# Patient Record
Sex: Female | Born: 1935 | Race: White | Hispanic: No | Marital: Married | Smoking: Never smoker
Health system: Southern US, Community
[De-identification: ages and names within clinical notes are randomized; demographics above are authoritative.]

## PROBLEM LIST (undated history)

## (undated) DIAGNOSIS — E222 Syndrome of inappropriate secretion of antidiuretic hormone: Secondary | ICD-10-CM

## (undated) DIAGNOSIS — I701 Atherosclerosis of renal artery: Secondary | ICD-10-CM

## (undated) DIAGNOSIS — E871 Hypo-osmolality and hyponatremia: Secondary | ICD-10-CM

## (undated) DIAGNOSIS — J189 Pneumonia, unspecified organism: Secondary | ICD-10-CM

## (undated) DIAGNOSIS — Z950 Presence of cardiac pacemaker: Secondary | ICD-10-CM

## (undated) DIAGNOSIS — I4891 Unspecified atrial fibrillation: Secondary | ICD-10-CM

## (undated) DIAGNOSIS — G40909 Epilepsy, unspecified, not intractable, without status epilepticus: Secondary | ICD-10-CM

## (undated) DIAGNOSIS — I10 Essential (primary) hypertension: Secondary | ICD-10-CM

## (undated) DIAGNOSIS — M8580 Other specified disorders of bone density and structure, unspecified site: Secondary | ICD-10-CM

## (undated) DIAGNOSIS — I495 Sick sinus syndrome: Secondary | ICD-10-CM

## (undated) DIAGNOSIS — K449 Diaphragmatic hernia without obstruction or gangrene: Secondary | ICD-10-CM

## (undated) DIAGNOSIS — M549 Dorsalgia, unspecified: Secondary | ICD-10-CM

## (undated) DIAGNOSIS — M1990 Unspecified osteoarthritis, unspecified site: Secondary | ICD-10-CM

## (undated) DIAGNOSIS — K219 Gastro-esophageal reflux disease without esophagitis: Secondary | ICD-10-CM

## (undated) DIAGNOSIS — I5030 Unspecified diastolic (congestive) heart failure: Secondary | ICD-10-CM

## (undated) HISTORY — DX: Gastro-esophageal reflux disease without esophagitis: K21.9

## (undated) HISTORY — DX: Sick sinus syndrome: I49.5

## (undated) HISTORY — DX: Unspecified osteoarthritis, unspecified site: M19.90

## (undated) HISTORY — PX: TONSILLECTOMY: SUR1361

## (undated) HISTORY — PX: CATARACT EXTRACTION: SUR2

## (undated) HISTORY — PX: CARDIOVERSION: SHX1299

## (undated) HISTORY — DX: Other specified disorders of bone density and structure, unspecified site: M85.80

## (undated) HISTORY — PX: BACK SURGERY: SHX140

## (undated) HISTORY — PX: APPENDECTOMY: SHX54

## (undated) HISTORY — DX: Diaphragmatic hernia without obstruction or gangrene: K44.9

## (undated) HISTORY — DX: Syndrome of inappropriate secretion of antidiuretic hormone: E22.2

## (undated) HISTORY — PX: KNEE ARTHROSCOPY: SUR90

## (undated) HISTORY — DX: Epilepsy, unspecified, not intractable, without status epilepticus: G40.909

## (undated) HISTORY — DX: Unspecified atrial fibrillation: I48.91

## (undated) HISTORY — DX: Dorsalgia, unspecified: M54.9

## (undated) HISTORY — DX: Atherosclerosis of renal artery: I70.1

## (undated) HISTORY — PX: CHOLECYSTECTOMY: SHX55

## (undated) HISTORY — DX: Pneumonia, unspecified organism: J18.9

## (undated) HISTORY — PX: PARTIAL HYSTERECTOMY: SHX80

## (undated) HISTORY — DX: Hypo-osmolality and hyponatremia: E87.1

## (undated) HISTORY — PX: TOTAL KNEE ARTHROPLASTY: SHX125

## (undated) HISTORY — PX: SHOULDER SURGERY: SHX246

## (undated) HISTORY — DX: Essential (primary) hypertension: I10

---

## 1998-04-15 ENCOUNTER — Other Ambulatory Visit: Admission: RE | Admit: 1998-04-15 | Discharge: 1998-04-15 | Payer: Self-pay | Admitting: *Deleted

## 1998-12-14 ENCOUNTER — Ambulatory Visit (HOSPITAL_COMMUNITY): Admission: RE | Admit: 1998-12-14 | Discharge: 1998-12-14 | Payer: Self-pay | Admitting: *Deleted

## 1998-12-14 ENCOUNTER — Encounter: Payer: Self-pay | Admitting: *Deleted

## 1999-06-22 ENCOUNTER — Encounter: Payer: Self-pay | Admitting: Surgery

## 1999-06-22 ENCOUNTER — Inpatient Hospital Stay (HOSPITAL_COMMUNITY): Admission: EM | Admit: 1999-06-22 | Discharge: 1999-06-23 | Payer: Self-pay | Admitting: Internal Medicine

## 2000-02-16 ENCOUNTER — Encounter: Admission: RE | Admit: 2000-02-16 | Discharge: 2000-02-16 | Payer: Self-pay | Admitting: *Deleted

## 2000-02-16 ENCOUNTER — Encounter: Payer: Self-pay | Admitting: *Deleted

## 2000-07-10 ENCOUNTER — Ambulatory Visit (HOSPITAL_COMMUNITY): Admission: RE | Admit: 2000-07-10 | Discharge: 2000-07-10 | Payer: Self-pay | Admitting: Cardiology

## 2001-03-27 ENCOUNTER — Encounter: Admission: RE | Admit: 2001-03-27 | Discharge: 2001-03-27 | Payer: Self-pay | Admitting: Internal Medicine

## 2001-03-27 ENCOUNTER — Encounter: Payer: Self-pay | Admitting: Internal Medicine

## 2001-05-09 ENCOUNTER — Encounter: Payer: Self-pay | Admitting: Specialist

## 2001-05-10 ENCOUNTER — Inpatient Hospital Stay (HOSPITAL_COMMUNITY): Admission: RE | Admit: 2001-05-10 | Discharge: 2001-05-11 | Payer: Self-pay | Admitting: Specialist

## 2002-04-08 ENCOUNTER — Encounter: Payer: Self-pay | Admitting: Internal Medicine

## 2002-04-08 ENCOUNTER — Encounter: Admission: RE | Admit: 2002-04-08 | Discharge: 2002-04-08 | Payer: Self-pay | Admitting: Internal Medicine

## 2002-04-22 ENCOUNTER — Encounter: Payer: Self-pay | Admitting: Specialist

## 2002-04-28 ENCOUNTER — Inpatient Hospital Stay (HOSPITAL_COMMUNITY): Admission: RE | Admit: 2002-04-28 | Discharge: 2002-05-03 | Payer: Self-pay | Admitting: Specialist

## 2002-06-12 ENCOUNTER — Inpatient Hospital Stay (HOSPITAL_COMMUNITY): Admission: AD | Admit: 2002-06-12 | Discharge: 2002-06-16 | Payer: Self-pay | Admitting: Specialist

## 2002-07-25 ENCOUNTER — Inpatient Hospital Stay (HOSPITAL_COMMUNITY): Admission: RE | Admit: 2002-07-25 | Discharge: 2002-07-26 | Payer: Self-pay | Admitting: Specialist

## 2003-03-12 ENCOUNTER — Encounter: Payer: Self-pay | Admitting: Internal Medicine

## 2003-03-12 ENCOUNTER — Encounter: Admission: RE | Admit: 2003-03-12 | Discharge: 2003-03-12 | Payer: Self-pay | Admitting: Internal Medicine

## 2003-03-13 ENCOUNTER — Encounter: Payer: Self-pay | Admitting: Internal Medicine

## 2003-03-13 ENCOUNTER — Encounter: Admission: RE | Admit: 2003-03-13 | Discharge: 2003-03-13 | Payer: Self-pay | Admitting: Internal Medicine

## 2003-08-18 ENCOUNTER — Inpatient Hospital Stay (HOSPITAL_COMMUNITY): Admission: RE | Admit: 2003-08-18 | Discharge: 2003-08-21 | Payer: Self-pay | Admitting: Orthopedic Surgery

## 2003-08-31 ENCOUNTER — Inpatient Hospital Stay (HOSPITAL_COMMUNITY): Admission: EM | Admit: 2003-08-31 | Discharge: 2003-09-01 | Payer: Self-pay | Admitting: Emergency Medicine

## 2003-10-16 ENCOUNTER — Inpatient Hospital Stay (HOSPITAL_COMMUNITY): Admission: RE | Admit: 2003-10-16 | Discharge: 2003-10-19 | Payer: Self-pay | Admitting: Orthopedic Surgery

## 2004-03-29 ENCOUNTER — Encounter: Admission: RE | Admit: 2004-03-29 | Discharge: 2004-03-29 | Payer: Self-pay | Admitting: Internal Medicine

## 2005-05-08 ENCOUNTER — Encounter: Admission: RE | Admit: 2005-05-08 | Discharge: 2005-05-08 | Payer: Self-pay | Admitting: Internal Medicine

## 2005-09-18 HISTORY — PX: CARDIAC CATHETERIZATION: SHX172

## 2005-11-18 ENCOUNTER — Inpatient Hospital Stay (HOSPITAL_COMMUNITY): Admission: EM | Admit: 2005-11-18 | Discharge: 2005-11-22 | Payer: Self-pay | Admitting: Emergency Medicine

## 2005-11-20 ENCOUNTER — Encounter (INDEPENDENT_AMBULATORY_CARE_PROVIDER_SITE_OTHER): Payer: Self-pay | Admitting: *Deleted

## 2006-05-09 ENCOUNTER — Encounter: Admission: RE | Admit: 2006-05-09 | Discharge: 2006-05-09 | Payer: Self-pay | Admitting: Internal Medicine

## 2006-08-06 ENCOUNTER — Inpatient Hospital Stay (HOSPITAL_COMMUNITY): Admission: EM | Admit: 2006-08-06 | Discharge: 2006-08-07 | Payer: Self-pay | Admitting: Emergency Medicine

## 2006-09-06 ENCOUNTER — Encounter: Admission: RE | Admit: 2006-09-06 | Discharge: 2006-09-06 | Payer: Self-pay | Admitting: Internal Medicine

## 2006-09-18 DIAGNOSIS — I701 Atherosclerosis of renal artery: Secondary | ICD-10-CM

## 2006-09-18 HISTORY — DX: Atherosclerosis of renal artery: I70.1

## 2006-10-25 ENCOUNTER — Encounter: Admission: RE | Admit: 2006-10-25 | Discharge: 2006-10-25 | Payer: Self-pay | Admitting: Internal Medicine

## 2006-12-11 ENCOUNTER — Ambulatory Visit: Payer: Self-pay | Admitting: *Deleted

## 2007-01-17 HISTORY — PX: RENAL ANGIOPLASTY: SHX2316

## 2007-01-21 ENCOUNTER — Ambulatory Visit: Payer: Self-pay | Admitting: Vascular Surgery

## 2007-02-15 ENCOUNTER — Ambulatory Visit (HOSPITAL_COMMUNITY): Admission: RE | Admit: 2007-02-15 | Discharge: 2007-02-15 | Payer: Self-pay | Admitting: Vascular Surgery

## 2007-02-15 ENCOUNTER — Ambulatory Visit: Payer: Self-pay | Admitting: Vascular Surgery

## 2007-02-25 ENCOUNTER — Ambulatory Visit: Payer: Self-pay | Admitting: Vascular Surgery

## 2007-03-18 ENCOUNTER — Ambulatory Visit: Payer: Self-pay | Admitting: Vascular Surgery

## 2007-05-14 ENCOUNTER — Encounter: Admission: RE | Admit: 2007-05-14 | Discharge: 2007-05-14 | Payer: Self-pay | Admitting: Internal Medicine

## 2007-06-14 ENCOUNTER — Ambulatory Visit: Payer: Self-pay | Admitting: Vascular Surgery

## 2007-10-10 ENCOUNTER — Encounter: Admission: RE | Admit: 2007-10-10 | Discharge: 2007-10-10 | Payer: Self-pay | Admitting: Cardiology

## 2007-12-06 ENCOUNTER — Ambulatory Visit: Payer: Self-pay | Admitting: Vascular Surgery

## 2008-05-14 ENCOUNTER — Encounter: Admission: RE | Admit: 2008-05-14 | Discharge: 2008-05-14 | Payer: Self-pay | Admitting: Internal Medicine

## 2008-06-05 ENCOUNTER — Ambulatory Visit: Payer: Self-pay | Admitting: Vascular Surgery

## 2008-12-04 ENCOUNTER — Ambulatory Visit: Payer: Self-pay | Admitting: Vascular Surgery

## 2009-05-20 ENCOUNTER — Encounter: Admission: RE | Admit: 2009-05-20 | Discharge: 2009-05-20 | Payer: Self-pay | Admitting: Internal Medicine

## 2009-07-16 ENCOUNTER — Ambulatory Visit: Payer: Self-pay | Admitting: Vascular Surgery

## 2009-12-31 ENCOUNTER — Ambulatory Visit: Payer: Self-pay | Admitting: Vascular Surgery

## 2010-03-23 ENCOUNTER — Inpatient Hospital Stay (HOSPITAL_COMMUNITY): Admission: AD | Admit: 2010-03-23 | Discharge: 2010-03-28 | Payer: Self-pay | Admitting: Internal Medicine

## 2010-04-18 DIAGNOSIS — J189 Pneumonia, unspecified organism: Secondary | ICD-10-CM

## 2010-04-18 HISTORY — DX: Pneumonia, unspecified organism: J18.9

## 2010-05-25 ENCOUNTER — Encounter: Admission: RE | Admit: 2010-05-25 | Discharge: 2010-05-25 | Payer: Self-pay | Admitting: Internal Medicine

## 2010-07-18 ENCOUNTER — Ambulatory Visit: Payer: Self-pay | Admitting: Vascular Surgery

## 2010-08-08 ENCOUNTER — Ambulatory Visit: Payer: Self-pay | Admitting: Cardiology

## 2010-10-09 ENCOUNTER — Encounter: Payer: Self-pay | Admitting: Internal Medicine

## 2010-10-10 ENCOUNTER — Encounter: Payer: Self-pay | Admitting: Internal Medicine

## 2010-12-04 LAB — BASIC METABOLIC PANEL
BUN: 2 mg/dL — ABNORMAL LOW (ref 6–23)
BUN: 4 mg/dL — ABNORMAL LOW (ref 6–23)
BUN: 4 mg/dL — ABNORMAL LOW (ref 6–23)
BUN: 5 mg/dL — ABNORMAL LOW (ref 6–23)
CO2: 31 mEq/L (ref 19–32)
Calcium: 8.2 mg/dL — ABNORMAL LOW (ref 8.4–10.5)
Calcium: 8.3 mg/dL — ABNORMAL LOW (ref 8.4–10.5)
Calcium: 8.6 mg/dL (ref 8.4–10.5)
Calcium: 8.9 mg/dL (ref 8.4–10.5)
Calcium: 9.2 mg/dL (ref 8.4–10.5)
Creatinine, Ser: 0.56 mg/dL (ref 0.4–1.2)
Creatinine, Ser: 0.6 mg/dL (ref 0.4–1.2)
Creatinine, Ser: 0.62 mg/dL (ref 0.4–1.2)
GFR calc Af Amer: >60 mL/min (ref >60)
GFR calc Af Amer: >60 mL/min (ref >60)
GFR calc Af Amer: >60 mL/min (ref >60)
GFR calc Af Amer: >60 mL/min (ref >60)
GFR calc non Af Amer: >60 mL/min (ref >60)
GFR calc non Af Amer: >60 mL/min (ref >60)
GFR calc non Af Amer: >60 mL/min (ref >60)
GFR calc non Af Amer: >60 mL/min (ref >60)
GFR calc non Af Amer: >60 mL/min (ref >60)
Glucose, Bld: 83 mg/dL (ref 70–99)
Glucose, Bld: 90 mg/dL (ref 70–99)
Glucose, Bld: 92 mg/dL (ref 70–99)
Glucose, Bld: 97 mg/dL (ref 70–99)
Potassium: 4 mEq/L (ref 3.5–5.1)
Sodium: 119 mEq/L — CL (ref 135–145)
Sodium: 131 mEq/L — ABNORMAL LOW (ref 135–145)
Sodium: 134 mEq/L — ABNORMAL LOW (ref 135–145)

## 2010-12-04 LAB — LEGIONELLA ANTIGEN, URINE

## 2010-12-04 LAB — COMPREHENSIVE METABOLIC PANEL
AST: 21 U/L (ref 0–37)
CO2: 27 mEq/L (ref 19–32)
Chloride: 82 mEq/L — ABNORMAL LOW (ref 96–112)
Creatinine, Ser: 0.69 mg/dL (ref 0.4–1.2)
GFR calc Af Amer: >60 mL/min (ref >60)
GFR calc non Af Amer: >60 mL/min (ref >60)
Glucose, Bld: 105 mg/dL — ABNORMAL HIGH (ref 70–99)
Total Bilirubin: 0.6 mg/dL (ref 0.3–1.2)

## 2010-12-04 LAB — DIFFERENTIAL
Basophils Absolute: 0 10*3/uL (ref 0.0–0.1)
Basophils Relative: 0 % (ref 0–1)
Eosinophils Absolute: 0.6 10*3/uL (ref 0.0–0.7)
Eosinophils Relative: 7 % — ABNORMAL HIGH (ref 0–5)
Neutrophils Relative %: 66 % (ref 43–77)

## 2010-12-04 LAB — CBC
HCT: 35.5 % — ABNORMAL LOW (ref 36.0–46.0)
Hemoglobin: 11.6 g/dL — ABNORMAL LOW (ref 12.0–15.0)
Hemoglobin: 12.1 g/dL (ref 12.0–15.0)
Hemoglobin: 12.9 g/dL (ref 12.0–15.0)
MCH: 31.2 pg (ref 26.0–34.0)
MCH: 31.8 pg (ref 26.0–34.0)
MCHC: 33.3 g/dL (ref 30.0–36.0)
MCHC: 33.4 g/dL (ref 30.0–36.0)
MCHC: 33.6 g/dL (ref 30.0–36.0)
MCHC: 34 g/dL (ref 30.0–36.0)
MCV: 93.3 fL (ref 78.0–100.0)
Platelets: 207 10*3/uL (ref 150–400)
Platelets: 211 10*3/uL (ref 150–400)
Platelets: 246 10*3/uL (ref 150–400)
Platelets: 280 10*3/uL (ref 150–400)
RBC: 3.81 MIL/uL — ABNORMAL LOW (ref 3.87–5.11)
RBC: 3.89 MIL/uL (ref 3.87–5.11)
RBC: 4.13 MIL/uL (ref 3.87–5.11)
RDW: 13.9 % (ref 11.5–15.5)
RDW: 14.1 % (ref 11.5–15.5)
RDW: 14.5 % (ref 11.5–15.5)

## 2010-12-04 LAB — SODIUM, URINE, RANDOM: Sodium, Ur: 58 mEq/L

## 2010-12-04 LAB — OSMOLALITY, URINE: Osmolality, Ur: 263 mOsm/kg — ABNORMAL LOW (ref 390–1090)

## 2011-01-30 ENCOUNTER — Encounter: Payer: Self-pay | Admitting: Nurse Practitioner

## 2011-01-30 ENCOUNTER — Encounter: Payer: Self-pay | Admitting: Internal Medicine

## 2011-01-30 ENCOUNTER — Telehealth: Payer: Self-pay | Admitting: Cardiology

## 2011-01-30 DIAGNOSIS — I701 Atherosclerosis of renal artery: Secondary | ICD-10-CM | POA: Insufficient documentation

## 2011-01-30 DIAGNOSIS — R0600 Dyspnea, unspecified: Secondary | ICD-10-CM | POA: Insufficient documentation

## 2011-01-30 DIAGNOSIS — I10 Essential (primary) hypertension: Secondary | ICD-10-CM | POA: Insufficient documentation

## 2011-01-30 DIAGNOSIS — K219 Gastro-esophageal reflux disease without esophagitis: Secondary | ICD-10-CM | POA: Insufficient documentation

## 2011-01-30 DIAGNOSIS — M549 Dorsalgia, unspecified: Secondary | ICD-10-CM | POA: Insufficient documentation

## 2011-01-30 DIAGNOSIS — J189 Pneumonia, unspecified organism: Secondary | ICD-10-CM | POA: Insufficient documentation

## 2011-01-30 NOTE — Telephone Encounter (Signed)
HAS NEW ONSET A-FIB, STARTING COUMADIN 5MG . TODAY. SAW SUE DRINKARD NP WITH DIZZINESS, SOB.  SUE WANTS HER SEEN THIS WEEK.

## 2011-01-31 NOTE — Procedures (Signed)
RENAL ARTERY DUPLEX EVALUATION   INDICATION:  Followup of right renal artery.   HISTORY:  Diabetes:  No  Cardiac:  No  Hypertension:  Yes  Smoking:  No   RENAL ARTERY DUPLEX FINDINGS:  Aorta-Proximal:  36 cm/s  Aorta-Mid:  78 cm/s  Aorta-Distal:  125 cm/s  Celiac Artery Origin:  108 cm/s  SMA Origin:  112 cm/s                                    RIGHT               LEFT  Renal Artery Origin:             135 cm/s            82 cm/s  Renal Artery Proximal:           95 cm/s             67 cm/s  Renal Artery Mid:                166 cm/s            51 cm/s  Renal Artery Distal:             128 cm/s            48 cm/s  Hilar Acceleration Time (AT):    0.69 m/s            0.68 m/s  Renal-Aortic Ratio (RAR):        2.1                 1.1  Kidney Size:                     9.7 cm              9.98  End Diastolic Ratio (EDR):  Resistive Index (RI):            0.75                0.74   IMPRESSION:  1. Patent right renal artery with less than 60% stenosis based on RAR      and velocity.  2. Left renal artery appears within normal limits.  3. Kidneys appear normal with respect to shape and size.        ___________________________________________  [name], M.D.   CB/MEDQ  D:  07/19/2009  T:  07/20/2009  Job:  [id number]

## 2011-01-31 NOTE — Procedures (Signed)
RENAL ARTERY DUPLEX EVALUATION   INDICATION:  Follow up renal artery disease.   HISTORY:  Diabetes:  No.  Cardiac:  No.  Hypertension:  Yes.  Smoking:  No.   RENAL ARTERY DUPLEX FINDINGS:  Aorta-Proximal:  51 cm/s  Aorta-Mid:  92 cm/s  Aorta-Distal:  95 cm/s  Celiac Artery Origin:  139 cm/s  SMA Origin:  224 cm/s                                    RIGHT               LEFT  Renal Artery Origin:             117 cm/s            100 cm/s  Renal Artery Proximal:           142 cm/s            63 cm/s  Renal Artery Mid:                214 cm/s            69 cm/s  Renal Artery Distal:             118 cm/s            64 cm/s  Hilar Acceleration Time (AT):  Renal-Aortic Ratio (RAR):        4.1                 1.4  Kidney Size:                     9.8 cm              11.2 cm  End Diastolic Ratio (EDR):  Resistive Index (RI):            0.75                0.81   IMPRESSION:  1. There appears to be >60% stenosis based on renal-aortic ratio and      velocities of the right renal artery.  2. Left renal artery appears within normal limits.  3. Kidneys appear normal with respect to size and shape.        ___________________________________________  [name], M.D.   CB/MEDQ  D:  12/31/2009  T:  12/31/2009  Job:  [id number]

## 2011-01-31 NOTE — Assessment & Plan Note (Signed)
OFFICE VISIT   BRYER, GOTTSCH  DOB:  1925-03-24                                       12/06/2007  WNUUV#:[id number]   The patient presents today for continued duplex followup of her right  renal artery angioplasty.  She underwent aortic and renal arteriogram on  02/15/2007 and underwent right renal artery angioplasty.  She presents  today for followup duplex evaluation.  She reports that she continues to  have treatment for hypertension and that her diastolic pressure runs in  the 70s and 80s.  She does not have any new major medical difficulties.   Today's blood pressure is 152/83, pulse 59, respirations 15.  She is in  no acute distress.  She underwent renal duplex evaluation today and we  have reviewed the results of this.  This shows no evidence of  significant recurrence with no significant stenosis in the bilateral  renal arteries.  I have reassured the patient with this . She will  continue her usual followup with Drs. Clelia Croft and [place].  We will see her  on a yearly basis with yearly duplex renal arteries to rule out  progression.   [name], M.D.  Electronically Signed   TFE/MEDQ  D:  12/06/2007  T:  12/09/2007  Job:  [id number]   cc:   Peter M. [place], M.D.  [name], M.D.

## 2011-01-31 NOTE — Procedures (Signed)
RENAL ARTERY DUPLEX EVALUATION   INDICATION:  Follow-up evaluation of right renal artery PTA performed on  02/15/07 by Dr. Arbie Cookey.  Patient has fibromuscular dysplasia.   HISTORY:  Diabetes:  No.  Cardiac:  No.  Hypertension:  Yes.  Smoking:  No.   RENAL ARTERY DUPLEX FINDINGS:  Aorta-Proximal:  43 cm/s  Aorta-Mid:  55 cm/s  Aorta-Distal:  50 cm/s  Celiac Artery Origin:  117 cm/s  SMA Origin:  95 cm/s                                    RIGHT               LEFT  Renal Artery Origin:             83/16 cm/s          65/18 cm/s  Renal Artery Proximal:           102/23 cm/s         57/14 cm/s  Renal Artery Mid:                127/23 cm/s         39/10 cm/s  Renal Artery Distal:             51/11 cm/s          53/14 cm/s  Hilar Acceleration Time (AT):  Renal-Aortic Ratio (RAR):        2.3                 1.5  Kidney Size:                     10.3 cm             10.1 cm  End Diastolic Ratio (EDR):       0.23 - 0.22         0.24 - 0.24  Resistive Index (RI):            0.78                0.76   IMPRESSION:  1. Renal-to-aortic ratio suggests <60% renal artery stenosis, status      post stent.  2. No left renal artery stenosis.  3. Kidneys are normal with respect to size and shape bilaterally.  4. Resistive indices and end-diastolic ratio suggest no parenchymal      disease bilaterally.   ___________________________________________  [name], M.D.   MC/MEDQ  D:  06/05/2008  T:  06/06/2008  Job:  [id number]

## 2011-01-31 NOTE — Op Note (Signed)
[name], [name]                 ACCOUNT NO.:  [id number]   MEDICAL RECORD NO.:  [id number]          PATIENT TYPE:  AMB   LOCATION:  SDS                          FACILITY:  [facility]   PHYSICIAN:  [name], M.D.    DATE OF BIRTH:  November 02, 1924   DATE OF PROCEDURE:  02/15/2007  DATE OF DISCHARGE:                               OPERATIVE REPORT   PREOPERATIVE DIAGNOSIS:  This right renal artery stenosis.   POSTOPERATIVE DIAGNOSIS:  This right renal artery stenosis.   PROCEDURES:  1. Aortogram with renal arteriogram.  2. Right renal artery angioplasty.   SURGEON:  [name], M.D.   ASSISTANT:  Nurse.   ANESTHESIA:  Lidocaine 1% local.   COMPLICATIONS:  None.   DISPOSITION:  To holding area in stable condition.   INDICATIONS:  The patient is an elderly white female with increasingly  severe difficult to control hypertension.  She underwent outpatient  Duplex renal arteries which showed probable stenosis in her mid right  renal artery.  She is taken to the angio suite at this time for  arteriography and possible intervention.   DESCRIPTION OF PROCEDURE:  The patient was taken to the peripheral  vascular cath lab and placed in the supine position where the area of  both groins were prepped and draped in the sterile fashion.  The right  common femoral artery area was sized local anesthesia and using a single-  wall puncture, a right common femoral artery was entered and a guidewire  passed up to the suprarenal aorta.  A 6-French sheath was passed over  the guidewire.  The pigtail catheter positioned to the level of the  suprarenal aorta.  AP projections was undertaken and this revealed  patent renal arteries bilaterally.  There was no evidence of stenosis in  the left renal artery.  There was evidence of fibromuscular dysplasia in  the right renal artery.  For this reason, angled right catheter was used  to selectively catheterize the right renal artery orifice and  selective  injections were undertaken again revealing fibromuscular dysplasia.  An  area at the initial branching of the renal arteries showed a web that  appeared to be flow-limiting.  With this and the known Duplex showing  elevated velocities in this area, it was recommend that she undergo  angioplasty.  The renal guide catheter was exchanged for the angled  right catheter.  The patient was given 3000 intravenous units of  heparin.  A 0.018 wire was chosen to selectively catheterize again the  renal artery and a 5 mm x 15 mm balloon was chosen.  This was an Programmer, applications  balloon.  It was positioned to the level of the web and was dilated.  This was dilated on two separate occasions at this level.  Repeat  arteriogram showed improvement and there was no  wasting of the 5 mm balloon.  The patient tolerated the procedure  without immediate complication.  Catheter and sheath were removed and  pressure was held for hemostasis once the ACT was normalized.  The  patient tolerated the procedure  without immediate complications and was  transferred to the holding area in stable condition.      [name], M.D.  Electronically Signed     TFE/MEDQ  D:  02/15/2007  T:  02/15/2007  Job:  [id number]   cc:   Peter M. [place], M.D.  [name], M.D.

## 2011-01-31 NOTE — Assessment & Plan Note (Signed)
OFFICE VISIT   [name], [name]  DOB:  06/06/1925                                       03/18/2007  IONGE#:[id number]   The patient presented today, actually had been misbooked by our office,  had just been seen on February 25, 2007.  She continues to do well, she  reports that she has been having better blood pressure as she checks it  at home.  Blood pressure today is 168/87.  She apologized for the  misbooking, she is to see me in 3 months.  She was told that there would  be no charge for today's visit and that she will see me in 3 months.   [name], M.D.  Electronically Signed   TFE/MEDQ  D:  03/18/2007  T:  03/19/2007  Job:  [id number]

## 2011-01-31 NOTE — Procedures (Signed)
RENAL ARTERY DUPLEX EVALUATION   INDICATION:  Followup right renal artery angioplasty on 02/15/2007.   HISTORY:  Diabetes:  No.  Cardiac:  No.  Hypertension:  Yes.  Smoking:  No.   RENAL ARTERY DUPLEX FINDINGS:  Aorta-Proximal:  58 cm/s  Aorta-Mid:  60 cm/s  Aorta-Distal:  66 cm/s  Celiac Artery Origin:  107 cm/s  SMA Origin:  135 cm/s                                    RIGHT               LEFT  Renal Artery Origin:             96 cm/s             65 cm/s  Renal Artery Proximal:           154 cm/s            74 cm/s  Renal Artery Mid:                162 cm/s            97 cm/s  Renal Artery Distal:             59 cm/s             51 cm/s  Hilar Acceleration Time (AT):  Renal-Aortic Ratio (RAR):        2.7                 1.6  Kidney Size:                     10.4 cm             10.8 cm  End Diastolic Ratio (EDR):  Resistive Index (RI):            0.75                0.75   IMPRESSION:  1. Patent bilateral renal arteries with no hemodynamically significant      stenoses noted based on Doppler velocities and renal aortic ratios.  2. The bilateral kidney length measurements and intrarenal resistive      indices are within normal limits.  3. Stable Doppler velocities noted in the bilateral renal arteries      when compared to the previous studies.   ___________________________________________  [name], M.D.   CH/MEDQ  D:  07/18/2010  T:  07/18/2010  Job:  [id number]

## 2011-01-31 NOTE — Procedures (Signed)
RENAL ARTERY DUPLEX EVALUATION   INDICATION:  Followup right renal artery PTA 02/15/2007 by Dr. Arbie Cookey.  The patient has FMD.   HISTORY:  Diabetes:  No.  Cardiac:  No.  Hypertension:  Yes.  Smoking:  No.   RENAL ARTERY DUPLEX FINDINGS:  Aorta-Proximal:  60 cm/s  Aorta-Mid:  68 cm/s  Aorta-Distal:  60 cm/s  Celiac Artery Origin:  113 cm/s  SMA Origin:  187 cm/s                                    RIGHT               LEFT  Renal Artery Origin:             115 cm/s            89 cm/s  Renal Artery Proximal:           135 cm/s            69 cm/s  Renal Artery Mid:                115 cm/s            73 cm/s  Renal Artery Distal:             117 cm/s            60 cm/s  Hilar Acceleration Time (AT):    0.067 sec           0.039 sec  Renal-Aortic Ratio (RAR):        2.3                 1.5  Kidney Size:                     10.4 cm             10.2 cm  End Diastolic Ratio (EDR):  Resistive Index (RI):            0.73                0.74   IMPRESSION:  1. Patent right renal artery with <60% stenosis based on RAR and      velocity.  2. Left renal artery appears within normal limits.  3. Kidneys appear normal with respect to shape and size.  4. No significant change from previous study.   ___________________________________________  [name], M.D.   AS/MEDQ  D:  12/04/2008  T:  12/04/2008  Job:  [id number]

## 2011-01-31 NOTE — Assessment & Plan Note (Signed)
OFFICE VISIT   [name], POLSON  DOB:  [birth year]                                       06/14/2007  EAVWU#:[id number]   The patient presents today for evaluation of her right renal angioplasty  on 02/15/2007.  She continues to do well with no new major medical  difficulties.  She reports that she does keep track of her blood  pressure, and occasionally it will be in the 160s.  Does not have any  pressures higher than this.  Today, her blood pressure is 167/85, pulse  58, respirations 18.  Her radial pulses are 2+ bilaterally.  She  underwent renal artery duplex today in our office, and this reveals less  than 60% stenoses in her renal arteries bilaterally.  I am pleased with  her initial results.  Will see her again in 6 months with repeat renal  artery duplex at that time.   [name], M.D.  Electronically Signed   TFE/MEDQ  D:  06/14/2007  T:  06/14/2007  Job:  [id number]   cc:   [name], M.D.  Peter M. [place], M.D.

## 2011-01-31 NOTE — Procedures (Signed)
RENAL ARTERY DUPLEX EVALUATION   INDICATION:  Followup evaluation of right renal artery PTA on Feb 15, 2007, by Dr. Arbie Cookey.  Patient with known FMD.   HISTORY:  Diabetes:  No.  Cardiac:  No.  Hypertension:  Yes.  Smoking:  No.   RENAL ARTERY DUPLEX FINDINGS:  Aorta-Proximal:  74 cm/s  Aorta-Mid:  73 cm/s  Aorta-Distal:  74 cm/s  Celiac Artery Origin:  101 cm/s  SMA Origin:  164 cm/s                                    RIGHT               LEFT  Renal Artery Origin:             93/28 cm/s          71/14 cm/s  Renal Artery Proximal:           95/28 cm/s          61/13 cm/s  Renal Artery Mid:                157/31 cm/s         70/19 cm/s  Renal Artery Distal:             105/30 cm/s         41/11 cm/s  Hilar Acceleration Time (AT):  Renal-Aortic Ratio (RAR):        2.1                 1.0  Kidney Size:                     10.8 cm x 4.53 cm   11.2 cm  End Diastolic Ratio (EDR):  Resistive Index (RI):            0.61                0.69   IMPRESSION:  1. Patent RRA PTA with <60% stenosis based on renal-aortic ratio and      velocity.  2. Left renal artery appears WNL.  3. Bilateral kidneys appear WNL.  4. Bilateral resistive indices WNL.    ___________________________________________  [name], M.D.   PB/MEDQ  D:  12/09/2007  T:  12/09/2007  Job:  [id number]

## 2011-01-31 NOTE — Procedures (Signed)
RENAL ARTERY DUPLEX EVALUATION   INDICATION:  Hypertension, fibromuscular dysplasia, right renal artery  PTA on Feb 15, 2007 by Dr. Arbie Cookey.   HISTORY:  Diabetes:  No.  Cardiac:  No.  Hypertension:  Yes.  Smoking:  No.   RENAL ARTERY DUPLEX FINDINGS:  Aorta-Proximal:  42 cm/s  Aorta-Mid:  66 cm/s  Aorta-Distal:  63 cm/s  Celiac Artery Origin:  115 cm/s  SMA Origin:  173 cm/s                                    RIGHT               LEFT  Renal Artery Origin:             124/31 cm/s         59/13 cm/s  Renal Artery Proximal:           159 cm/s            61/13 cm/s  Renal Artery Mid:                73/16 cm/s          143/32 cm/s  Renal Artery Distal:             38/10 cm/s          60/14 cm/s  Hilar Acceleration Time (AT):    0.07 m/s2           0.06 m/s2  Renal-Aortic Ratio (RAR):        3.8                 3.4  Kidney Size:                     9.7 cm X 4.4 cm     10.7 cm X 5.5 cm  End Diastolic Ratio (EDR):       0.24 to 0.21        0.22 to 0.26  Resistive Index (RI):            0.81                0.79   IMPRESSION:  1. Kidneys are normal with respect to shape and size bilaterally.  2. Renal to aortic ratio suggests less than 60% renal artery stenosis      bilaterally.  3. No evidence of parenchymal disease bilaterally by end-diastolic      ratio.   ___________________________________________  [name], M.D.   MC/MEDQ  D:  06/14/2007  T:  06/15/2007  Job:  [id number]

## 2011-02-01 ENCOUNTER — Ambulatory Visit (INDEPENDENT_AMBULATORY_CARE_PROVIDER_SITE_OTHER): Payer: Medicare Other | Admitting: Nurse Practitioner

## 2011-02-01 ENCOUNTER — Encounter: Payer: Self-pay | Admitting: Nurse Practitioner

## 2011-02-01 VITALS — BP 140/70 | HR 88 | Ht 63.0 in | Wt 161.6 lb

## 2011-02-01 DIAGNOSIS — I10 Essential (primary) hypertension: Secondary | ICD-10-CM

## 2011-02-01 DIAGNOSIS — Z7901 Long term (current) use of anticoagulants: Secondary | ICD-10-CM | POA: Insufficient documentation

## 2011-02-01 DIAGNOSIS — I4891 Unspecified atrial fibrillation: Secondary | ICD-10-CM | POA: Insufficient documentation

## 2011-02-01 NOTE — Patient Instructions (Addendum)
Stop the Tekturna. Stay on the Coumadin. You will need to have it checked every week. We are going to get an ultrasound of your heart. After your coumadin has been good for about 3 to 4 weeks, you will come back and see Dr. [place] to discuss a possible cardioversion (giving your heart a shock).  We are going to place a monitor on you for the next 24 hours to see how slow your heart rate is and how fast  Stay on your other medicines. You may stop your aspirin on Friday. Try to minimize your use of Mobic (this increases your risk of bleeding with coumadin)  We will see you in one month.

## 2011-02-01 NOTE — Assessment & Plan Note (Signed)
She has already been placed on coumadin. She will need weekly monitoring. I have placed a 24 hour holter. She does have this history of bradycardia and is on no rate slowing medicines. I think we need to rule out worsening bradycardia/pauses, especially since she is dizzy and presyncopal. She may need permanent pacemaker. We will update her echo. Tentatively, will plan for a cardioversion after her coumadin has been therapeutic for 3 to 4 weeks. Samples of coumadin were given here in the office today.

## 2011-02-01 NOTE — Progress Notes (Signed)
[name] Date of Birth: Dec 23, 1924   History of Present Illness: [name] is seen today for a work in visit. She is seen for Dr. [place]. She was seen earlier this week by Willis Modena, NP at [facility]. She was noted to be in atrial fib. Coumadin was started. She feels bad. She is very fatigued. She has felt bad for at least the last one to two weeks.  She is lightheaded and dizzy. She has chronic DOE. No chest pain. She feels her heart "beating funny". No frank syncope. Her rate is controlled. She is not on any rate slowing medicines due to history of bradycardia. Blood pressure at home has been lower for the past one to two weeks.   Current Outpatient Prescriptions on File Prior to Visit  Medication Sig Dispense Refill  . amLODipine-olmesartan (AZOR) 10-40 MG per tablet Take 1 tablet by mouth daily.        Marland Kitchen aspirin 325 MG EC tablet Take 81 mg by mouth daily.       . Calcium Citrate (CITRACAL PO) Take by mouth daily.        . calcium-vitamin D (OSCAL WITH D) 500-200 MG-UNIT per tablet Take 1 tablet by mouth 2 (two) times daily.        . cetirizine (ZYRTEC) 10 MG tablet Take 10 mg by mouth daily.        . cholecalciferol (VITAMIN D) 1000 UNITS tablet Take 1,000 Units by mouth daily.        . divalproex (DEPAKOTE) 500 MG EC tablet Take 500 mg by mouth at bedtime.        Marland Kitchen lisinopril (PRINIVIL,ZESTRIL) 40 MG tablet Take 40 mg by mouth 2 (two) times daily.        . meloxicam (MOBIC) 7.5 MG tablet Take 7.5 mg by mouth as needed.        . methocarbamol (ROBAXIN) 500 MG tablet Take 500 mg by mouth daily. 2 DAILY       . montelukast (SINGULAIR) 10 MG tablet Take 10 mg by mouth at bedtime.        . Multiple Vitamin (MULTIVITAMIN) tablet Take 1 tablet by mouth daily.        Marland Kitchen omeprazole (PRILOSEC) 20 MG capsule Take 20 mg by mouth daily.        . polyethylene glycol powder (GLYCOLAX/MIRALAX) powder Take 17 g by mouth daily.        . timolol (BETIMOL) 0.5 % ophthalmic solution 1 drop 2  (two) times daily.        Marland Kitchen tolterodine (DETROL LA) 4 MG 24 hr capsule Take 4 mg by mouth daily.        Marland Kitchen warfarin (COUMADIN) 5 MG tablet Take 5 mg by mouth as directed.          No Known Allergies  Past Medical History  Diagnosis Date  . Pneumonia 04/2010  . Hypertension   . Renal artery stenosis 2008    STATUS POST ANGIOPLASTY  . GERD (gastroesophageal reflux disease)   . Back pain     CHRONIC  . Atrial fibrillation   . Chronic anticoagulation     Past Surgical History  Procedure Date  . Cholecystectomy   . Back surgery     X2  . Total knee arthroplasty     RIGHT KNEE  . Tonsillectomy   . Appendectomy   . Cardiac catheterization 2007    Normal    History  Smoking status  .  Never Smoker   Smokeless tobacco  . Never Used    History  Alcohol Use No    Family History  Problem Relation Age of Onset  . Stroke Mother   . Stroke Father     Review of Systems: The review of systems is positive for arthritis, presyncope and fatigue.  All other systems were reviewed and are negative.  Physical Exam: BP 140/70  Pulse 88  Ht 5\' 3"  (1.6 m)  Wt 161 lb 9.6 oz (73.301 kg)  BMI 28.63 kg/m2 Patient is pleasant and in no acute distress. She looks like she doesn't feel well and is fatigued. Skin is warm and dry. Color is normal.  HEENT is unremarkable. Normocephalic/atraumatic. PERRL. Sclera are nonicteric. Neck is supple. No masses. No JVD. Lungs are fairly clear. Cardiac exam shows an irregular rhythm. Abdomen is soft. Extremities are with trace edema. Gait and ROM are intact. No gross neurologic deficits noted.  LABORATORY DATA:  EKG from Dr. Alver Fisher office shows atrial fib. Ventricular response is controlled.   Assessment / Plan:

## 2011-02-01 NOTE — Assessment & Plan Note (Signed)
She needs at least weekly INR's until cardioversion is performed. I have asked her to stop her aspirin on Friday. I have also asked her to limit her use of her Mobic to help decrease her risk of bleeding.

## 2011-02-01 NOTE — Assessment & Plan Note (Signed)
She is on ACE, ARB in addition to the [place]. Due to recent recommendations, we will stop the Tekturna. She is to continue to monitor her blood pressure at home.

## 2011-02-03 ENCOUNTER — Telehealth: Payer: Self-pay | Admitting: *Deleted

## 2011-02-03 NOTE — H&P (Signed)
NAME:  [name]                          ACCOUNT NO.:   MEDICAL RECORD NO.:                             PATIENT TYPE:   LOCATION:                                       FACILITY:   PHYSICIAN:  Philips J. Montez Morita, M.D.             DATE OF BIRTH:  03-02-1925   DATE OF ADMISSION:  06/12/2002  DATE OF DISCHARGE:                                HISTORY & PHYSICAL   CHIEF COMPLAINT:  Problems with my right knee.   HISTORY OF PRESENT ILLNESS:  This 77 year old white female who was seen by  Dr. Montez Morita post total knee replacement arthroplasty on April 28, 2002.  She  did very well postoperatively until recently, specifically June 08, 2002, when she began noticing drainage from the distal part of her anterior  total knee surgical site.  She had no trauma to this knee, just merely the  skin opened up and she began having drainage.  She saw Dr. Montez Morita on  June 10, 2002.  He started her on p.o. antibiotic therapy.  When seen  today, she had a little more drainage of the seropurulent type material.  She also had some mild swelling and some generalized erythema.  It was felt  this patient developed cellulitis secondary to possible stitch abscess  versus sinus tract.  It was decided to admit her for aggressive IV  antibiotic therapy, warm soaks and observation.  Hopefully, will not have to  do any surgical work to this knee.   PAST MEDICAL HISTORY:  Please refer to her April 28, 2002, history and  physical.   CURRENT MEDICATIONS:  1. __________ 25 mg one q.d.  2. Norvasc 5 mg one q.d.  3. Hydrochlorothiazide 25 mg one q.d.  4. Aciphex 20 mg one q.d.  5. Celebrex 200 mg b.i.d.   ALLERGIES:  No known drug allergies.   SOCIAL HISTORY:  No tobacco and no alcohol.   REVIEW OF SYSTEMS:  Essentially negative other than generalized arthritis  and present illness.  Dr. __________ is her family physician.   PHYSICAL EXAMINATION:  GENERAL APPEARANCE:  The patient is an alert  and  cooperative 77 year old white female accompanied by her friend, [name].  VITAL SIGNS:  Blood pressure 150/78, pulse 70, respiratory rate 12.  HEENT:  Normocephalic.  PERRLA.  Oropharynx clear.  CHEST:  Clear to auscultation, no rhonchi, no rales.  CARDIOVASCULAR:  Regular rate and rhythm, no murmurs are heard.  ABDOMEN:  Soft, nontender.  Liver and spleen not felt.  GENITALIA:  Rectal, pelvic, and breast examinations not done, not pertinent  to present illness.  EXTREMITIES:  Left lower knee is seen with generalized erythema.  There is a  small opening in the distal anterior knee wound with some seropurulent  drainage material.  It is tender to palpation.   ADMISSION DIAGNOSIS:  1. Infected sinus tract left total knee  replacement arthroplasty.  2. Hypertension.  3. Gastroesophageal reflux disease.   PLAN:  The patient will have aggressive IV antibiotics therapy.  Will begin  with Ancef.  Also rest the knee and use warm soaks.      Dooley L. Shela Nevin, P.A.             Philips J. Montez Morita, M.D.    DLU/MEDQ  D:  06/12/2002  T:  06/12/2002  Job:  [id number]   cc:   Dr. Alvina Chou

## 2011-02-03 NOTE — H&P (Signed)
[name], [name]                           ACCOUNT NO.:  [id number]   MEDICAL RECORD NO.:  [id number]                   PATIENT TYPE:  INP   LOCATION:  NA                                   FACILITY:  [facility]   PHYSICIAN:  Madlyn Frankel. Charlann Boxer, M.D.               DATE OF BIRTH:  [birth year]   DATE OF ADMISSION:  DATE OF DISCHARGE:                                HISTORY & PHYSICAL   CHIEF COMPLAINT:  Increase in left knee pain status post left total knee  arthroplasty.   HISTORY OF PRESENT ILLNESS:  [name] presents to Madlyn Frankel. Charlann Boxer, M.D.  status post a left total knee arthroplasty by [name], M.D. with a  note of Philips Montez Morita, M.D. operative note that he had difficulty getting  the patella to track appropriately within the trochlear groove despite  several efforts in attempt to free line the patella which resulted in loss  of extensive mechanism from the tubercle.  This was repaired  intraoperatively with staples.  The patient has been followed up in [name], M.D. clinic with complaints of some knee buckling, swelling, and  some discomfort.  She is unable to perform her activities or her activities  she enjoys which are gardening.  In the clinic she presented with same  complaints and expresses concern about walking on ground that is uneven.  She also states that she is having some swelling in her left knee with some  increased amount of pain at times.  Madlyn Frankel Charlann Boxer, M.D. feels it best to  proceed with surgery and try to repair the extensor mechanism of the left  knee.  The patient agrees.  Risks and benefits of the surgery have been  discussed with the patient.  The patient wishes to proceed.   PAST MEDICAL HISTORY:  1. Hypertension.  2. Gastroesophageal reflux disease.  3. Osteoarthritis.   PAST SURGICAL HISTORY:  1. Left total knee arthroplasty.  2. Right total knee arthroplasty.  3. Left rotator cuff repair.  4. Lumbar back surgery.   MEDICATIONS:  1.  Lisinopril 20 mg one p.o. q.a.m.  2. Norvasc 5 mg one p.o. q.a.m.  3. HCTZ 25 mg one p.o. q.a.m.  4. Aciphex 20 mg one p.o. q.a.m.  5. Aspirin 325 mg one p.o. q.p.m. which she has been instructed to stop     taking.  6. Robaxin 500 mg one p.o. q.h.s.  7. Os-Cal one p.o. b.i.d.  8. Multivitamin p.o. daily.  9. Celebrex 200 mg one p.o. q.h.s.  10.      Flonase p.r.n.   ALLERGIES:  No known drug allergies.   SOCIAL HISTORY:  The patient denies any tobacco or alcohol use.  She is  married.  She lives in a [place] house with two steps entering house.  Her  husband will be caregiver after surgery.   FAMILY HISTORY:  Father:  Deceased coronary  artery disease, cerebrovascular  accident, hypertension, and diabetes.  Mother:  Cerebrovascular accident,  coronary artery disease, diabetes.   REVIEW OF SYSTEMS:  GENERAL:  Denies fevers, chills, night sweats, bleeding  tendencies.  CNS:  Denies blurry or double vision, seizures, headaches,  paralysis.  RESPIRATORY:  Positive shortness of breath with exertion.  Denies productive cough, hemoptysis.  CARDIOVASCULAR:  Denies chest pain,  angina, orthopnea.  GASTROINTESTINAL:  Positive constipation.  Denies  nausea, vomiting, diarrhea, melena, bloody stools.  GENITOURINARY:  Positive  nocturia.  Denies dysuria, hematuria, discharge.  MUSCULOSKELETAL:  Positive  numbness and tingling in right hand.   PHYSICAL EXAMINATION:  VITAL SIGNS:  Blood pressure 140/65, pulse 72,  respirations 12.  GENERAL:  Well-developed, well-nourished 77 year old female.  HEENT:  Normocephalic, atraumatic.  Pupils are equal, round, and reactive to  light.  NECK:  Supple.  No carotid bruit noted.  CHEST:  Clear to auscultation bilaterally.  No wheezes or crackles.  HEART:  Regular rate and rhythm without murmurs, rubs, or gallops.  ABDOMEN:  Soft, nontender, nondistended.  Positive bowel sounds x4.  EXTREMITIES:  Effusion to the left knee.  She had active extension to  only  about 30-35 degrees.  She has 120 degrees of flexion.  She walks with a  slight antalgic gait on the left side.  She is neurovascularly intact  distally.  SKIN:  No rashes or lesions.   LABORATORIES:  X-ray reveals significant patella alta of the left patella  left knee.   IMPRESSION:  1. 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  2. Hypertension.  3. Gastroesophageal reflux disease.   PLAN:  The patient will be admitted to [facility] on August 18, 2003 and undergo removal of hardware left knee with possible primary repair  of the extensor mechanism versus auto versus Allograft supplementation of  the left knee by Madlyn Frankel. Charlann Boxer, M.D.     Clarene Reamer, P.A.-C.                   Madlyn Frankel Charlann Boxer, M.D.    SW/MEDQ  D:  08/14/2003  T:  08/14/2003  Job:  [id number]   cc:   Wilson Singer, M.D.  [address]  [place]  [place] [address]  Fax: [phone]

## 2011-02-03 NOTE — Op Note (Signed)
[facility]  Patient:    [name], [name] Visit Number: [id number] MRN: [id number]          Service Type: SUR Location: 4W [id number] 01 Attending Physician:  Rocky Crafts Proc. Date: 05/10/01 Adm. Date:  05/10/2001                             Operative Report  PREOPERATIVE DIAGNOSIS:  Open hematoma prepatellar area, right knee.  POSTOPERATIVE DIAGNOSIS:  Open hematoma prepatellar area, right knee.  OPERATION PERFORMED:  Debridement hematoma irrigation and loose closure.  HISTORY OF PRESENT ILLNESS:  This lady has had a total knee on that side. She had a fall, developed a large prepatellar hematoma that was closed, was seen in the office. She came in and the skin had broken down over the top of it, was open. The fall created a small fracture through the patella which gave her enough symptoms but allowed access into the joint so it was felt that once this hematoma had opened that we best irrigate it and debride it.  DESCRIPTION OF PROCEDURE:  The patient was then placed on the operating table under general anesthesia, prepped with Betadine and draped routinely. No tourniquet was used. A large curette was used first of all to loosen up the clot which was then excresced manually. The opening was not large enough to admit the tip of the standard sucker tip and the opening was not enlarged any further. The irrigation was then carried out with triple antibiotic solution with a large bulb syringe and it was used within the hematoma to suck out the remaining clot so that nice clear fluid remained. At the end, two mattress sutures placed loosely of 4-0 nylon were placed. A compression dressing across the front, she goes to recovery in good condition. Attending Physician:  Rocky Crafts DD:  05/10/01 TD:  05/10/01 Job: [id number]

## 2011-02-03 NOTE — H&P (Signed)
[name], [name]                 ACCOUNT NO.:  [id number]   MEDICAL RECORD NO.:  [id number]          PATIENT TYPE:  EMS   LOCATION:  ED                           FACILITY:  [facility]   PHYSICIAN:  Lonia Blood, M.D.      DATE OF BIRTH:  1924-10-07   DATE OF ADMISSION:  11/18/2005  DATE OF DISCHARGE:                                HISTORY & PHYSICAL   ADDENDUM:  1.  The patient's heart rate has been running in the 40s to 50s here.  She      has no prior history of cardiac disease, but she is known to be      hypertensive.  In view of that, we will keep patient on telemetry bed.      We will also check a 2-D echocardiogram and serial cardiac enzymes. The      patient has a family history of coronary artery disease in addition.      Her bradycardia is out of tune with the fact that she is dehydrated and      then being bradycardic at the same time.  I would have expected her to      be tachycardic in this situation.  Hence, we will evaluate her for      possible cardiac disease.  2.  Anemia: The patient's hemoglobin is also 10.7.  During last admission 2      years ago, she was noted to have blood loss anemia.  It is not clear why      she has hemoglobin this low right now.  I will go ahead and check iron      studies, check ferritin, but also check B12 and folate.  Her MCV is      normal and indication that this is normocytic anemia.  So we will follow      closely.  If she is proven to be iron deficient, she may need to have a      colonoscopy at some point.      Lonia Blood, M.D.  Electronically Signed     LG/MEDQ  D:  11/18/2005  T:  11/19/2005  Job:  [id number]

## 2011-02-03 NOTE — Discharge Summary (Signed)
[name], [name]                           ACCOUNT NO.:  [id number]   MEDICAL RECORD NO.:  [id number]                   PATIENT TYPE:  INP   LOCATION:  0454                                 FACILITY:  [facility]   PHYSICIAN:  Jacqueline Frankel. Charlann Whitney, M.D.               DATE OF BIRTH:  04/21/1925   DATE OF ADMISSION:  10/16/2003  DATE OF DISCHARGE:  10/19/2003                                 DISCHARGE SUMMARY   ADMISSION DIAGNOSES:  1. Hardware failure of left knee.  2. Hypertension.  3. Gastroesophageal reflux disease.  4. Osteoarthritis.   DISCHARGE DIAGNOSES:  1. Hardware failure of left knee, status post repair of extensor mechanism     of left knee.  2. Hypertension.  3. Gastroesophageal reflux disease.  4. Osteoarthritis.  5. Postoperative hemorrhagic anemia, stable at time of discharge.   PROCEDURE:  The patient was taken to the operating room on October 16, 2003,  underwent an left total knee repair, extensor mechanism repair, utilizing  screws and washer, and suture technique.  Surgeon was Dr. Durene Romans.  Assistant was [employer], P.A.-C.  Hemovac drain x1 was placed at time  of surgery.   CONSULTATIONS:  1. Physical therapy.  2. Occupational therapy.   BRIEF HISTORY:  The patient is a 77 year old female status post left total  knee arthroplasty and status post repair of extensor mechanism of left knee.  The patient is followed by Dr. Charlann Whitney in clinic where x-rays were taken and  noted that the screw had backed out from the bone.  Dr. Charlann Whitney felt that it  was best to proceed with repairing the extensor mechanism.  The patient  agreed.  Risks and benefits of the surgery had been discussed with the  patient, and the patient wishes to proceed.   LABORATORY DATA:  CBC on admission showed a hemoglobin of 13.6, hematocrit  40.7, white blood cell count 7.4, red blood cell count 4.84.  Serial H&H  were followed throughout hospital stay.  Hemoglobin and hematocrit did  decline  to 10.5 and 31.6 on October 19, 2003, but was stable at the time of  discharge.  Differential on admission showed monocytes high at 13.  Coagulation studies on admission were all within normal limits.  PT/INR at  the time of discharge were 12.5 and 0.9, respectively, on Coumadin therapy.  Routine chemistries on admission showed sodium low at 131, chloride low at  95.  Serial chemistries were followed throughout the stay.  Sodium did  decline to 129 on October 18, 2003.  However, at the time of discharge was  on the rise at 131.  Chloride fell to 92, also on October 18, 2003, but was  back up to the normal range at 96 on October 19, 2003.  Glucose ranged from  a high of 154 on October 17, 2003, to 95 at the time of admission.  There is  no EKG on this chart.  X-rays:  Postoperative x-rays reveal left total knee  prosthesis and two proximal tibial screws.   HOSPITAL COURSE:  The patient was admitted to [facility] and taken  to the operating room.  She underwent the above stated procedure without  complications.  The patient tolerated the procedure well, allowed to return  to the recovery room and then to the orthopaedic floor for continued  postoperative care.  On postoperative day #1, the patient was doing well,  had no complaints, Hemovac was discontinued on this date.  On postoperative  day #2, October 18, 2003, the patient was doing well, no major complaints.  Hematocrit was 33, sodium was 129.  Incision was clean, dry, and intact.  The patient was weightbearing as tolerated.  On October 19, 2003, the  patient was doing well, was ready for discharge.  She was neurovascularly  intact to the left lower extremity.  Incision was clean, dry, and intact.  Hemoglobin and hematocrit were 10.5 and 31.6.   DISPOSITION:  The patient is discharged home on October 19, 2003.   DISCHARGE MEDICATIONS:  1. Vicodin #50 one to two p.o. q.4-6h. p.r.n. pain.  2. Robaxin 500 mg #50 one p.o.  q.6-8h. p.r.n. for spasm.  3. Coumadin 1 mg p.o. daily x3 weeks.   DIET:  As tolerated.   ACTIVITY:  The patient is weightbearing as tolerated in knee immobilizer at  all times.   WOUND CARE:  The patient is to perform daily dressing changes until no  drainage.  The patient may shower when no drainage.   FOLLOWUP:  The patient is to follow up with Dr. Charlann Whitney in two days.  She is to  call the office for an appointment.   CONDITION ON DISCHARGE:  Stable and improved.     Clarene Reamer, P.A.-C.                   [name], M.D.    SW/MEDQ  D:  10/19/2003  T:  10/19/2003  Job:  [id number]

## 2011-02-03 NOTE — Discharge Summary (Signed)
NAMEJANIJAH, [name]                           ACCOUNT NO.:  [id number]   MEDICAL RECORD NO.:  [id number]                   PATIENT TYPE:  INP   LOCATION:  0361                                 FACILITY:  [facility]   PHYSICIAN:  Melissa L. Ladona Ridgel, MD               DATE OF BIRTH:  Jul 11, 1925   DATE OF ADMISSION:  08/31/2003  DATE OF DISCHARGE:  09/01/2003                                 DISCHARGE SUMMARY   ADDENDUM:  Please note a 2-D echocardiogram has been completed on September 01, 2003, the results of which are pending and available through E-chart.  We will attempt to fax the 2-D echocardiogram results from medical records  once they are available to both Dr. Cornelius Moras and Dr. [name].                                               Melissa L. Ladona Ridgel, MD    MLT/MEDQ  D:  09/01/2003  T:  09/01/2003  Job:  [id number]

## 2011-02-03 NOTE — Op Note (Signed)
[name], [name]                           ACCOUNT NO.:  [id number]   MEDICAL RECORD NO.:  [id number]                   PATIENT TYPE:  INP   LOCATION:  0003                                 FACILITY:  [facility]   PHYSICIAN:  Madlyn Frankel. Charlann Boxer, M.D.               DATE OF BIRTH:  1925/03/07   DATE OF PROCEDURE:  08/18/2003  DATE OF DISCHARGE:                                 OPERATIVE REPORT   PREOPERATIVE DIAGNOSIS:  Extensor mechanism incompetence, status post left  total knee replacement from 1993.   POSTOPERATIVE DIAGNOSES:  1. Extensor mechanism incompetence, status post left total knee replacement     from 1993.  2. Retained hardware.   PROCEDURE:  1. Removal of staples, removal of hardware.  2. Poly-exchange with an Osteonics 15 x #5 mm tibial tray insert.  3. Extensor mechanism reconstruction using fiber wire and an Arthrex 30 mm x     6.5 mm screw and washer.   SURGEON:  Oley Balm. Charlann Boxer, M.D.   ASSISTANT:  [name], P.A.-C.   ANESTHESIA:  General.   ESTIMATED BLOOD LOSS:  200 cc.   TOURNIQUET TIME:  70 minutes at 350 mmHg.   DRAINS:  One.   COMPLICATIONS:  None apparent.   INDICATIONS FOR PROCEDURE:  [name] is a very pleasant 77 year old female  who is status post left total knee replacement performed in ____________ by  Dr. Dannette Barbara. The procedure was complicated by an extensor mechanism  incompetence off the tibial tubercle.  Patient presented with instability  and extensor lag of about 45-50 degrees.  After a period of physical therapy  attempt, as well as attempted bracing, which she would not tolerate, we  discussed surgical options. After discussing all options, risks and benefits  thereof, she consented for removal of hardware and extensor mechanism  reconstruction.   DESCRIPTION OF PROCEDURE:  The patient was taken to the operating theater.  Once adequate anesthesia and preoperative antibiotics, 1 gram Ancef was  administered, the patient was  positioned supine.  Proximal thigh tourniquet  was placed. The left lower extremity was then prepped and draped in a  sterile fashion.  Leg was exsanguinated and tourniquet elevated to 350 mmHg.   The patient's previous incision was incised.  Sharp dissection was carried  down to identify the extensor mechanism and retinacular tissue.  Medial  parapatellar arthrotomy was performed to define the medial border.  Patient  was noted to have a pretty decent amount of patellar tendon still present.  After further delineating this on the lateral border, two #2 fiber wire  sutures were then passed in a Krakow fashion, within the body of the tendon.  At this point, the 3 staples that were retained were removed without  difficulty.  There was some loss of bone at one of the most distal staples.  The bone was preserved for bone grafting later.   A  drill hole was then made in the proximal tibia at the level of the  tubercle, for later passage of the fiber wire suture.  Following this  preparation, the old polyethylene was removed and trials carried out.  To  me, a 15 mm poly felt a little bit more stable in extension with varus and  valgus stress, and also would serve the purpose of slightly elevating the  joint surface to provide some more stability to her knee. The poly was  exchanged for a 15 mm x #5 tibial tray, posterior stabilized insert. This  was then packed into position after debriding the posterior aspect of the  knee, as well as plane off the tibial tray.   With the new poly in place, attention was directed to fixation of the  mechanism.  With the Allis clamp on the distal tip of the patellar tendon to  hold the patellar tendon in the proper location on the tibial tubercle, an  Arthrex 6.5 x 30 mm screw was passed through a drill hole into the tibial  tubercle.  It was restricted secondary to the tibial tray in depth.  However, it had a decent bite. The suture ends were then re-tied to  the post  of the screw to provide added fixation.  With gentle traction and thumb  pressure over the screw to prevent pull-out, the knee had good flexion to at  least 80-90 degrees, with the patella tracking nicely of the trochlea of the  component, no evidence of lateral tilt.   At this point, the knee was copiously irrigated with normal saline solution.  A medium Hemovac drain was placed deep. The extensor mechanism was then  reapproximated using #1 PDS suture.  The remaining retinacular tissue that  was over on the lateral side was laid across the sutures of the Krakow  stitch, and the subcutaneous layer reapproximated using 2-0 Vicryl, and skin  was reapproximated using a 4-0 subcuticular monofilament.  The knee was  cleaned, dried and dressed sterilely, Steri-Strips and then a bulky Jones  dressing. The patient was placed in a knee immobilizer.   POSTOPERATIVE PLAN:  The patient is to be kept in extension with the knee  immobilizer and locked knee brace for a period of six weeks prior to motion.  She is going to be nonweightbearing to allow her tibia to heal slightly  prior to weightbearing.  In two weeks, she will be weightbearing in a  straight leg with a knee immobilizer.                                               Madlyn Frankel Charlann Boxer, M.D.    MDO/MEDQ  D:  08/18/2003  T:  08/18/2003  Job:  [id number]

## 2011-02-03 NOTE — Cardiovascular Report (Signed)
[facility]  Patient:    TIONNE, CARELLI                        MRN: [id number] Proc. Date: 07/10/00 Adm. Date:  [id number] Attending:  [place], Peter Manning CC:         Georg Ruddle. Viviann Spare, M.D.   Cardiac Catheterization  INDICATIONS:  The patient is a 77 year old white female with history of hypertension and family history of vascular disease who presents with symptoms of increasing chest pain.  ACCESS:  Via right femoral artery using standard Seldinger technique.  EQUIPMENT:  6 French 4 cm right and left Judkins catheters, 6 French pigtail catheter, 6 French arterial sheath.  MEDICATIONS:  Local anesthesia with 1% Xylocaine.  CONTRAST:  120 cc of Omnipaque.  HEMODYNAMIC DATA:  Aortic pressure is 138/59 with a mean of 90 mmHg.  Left ventricular pressure was 145 with EDP of 21 mmHg.  ANGIOGRAPHIC DATA:  The left coronary artery arises and distributes normally.  The left main coronary artery is normal.  The left anterior descending artery is normal.  The left circumflex coronary artery is normal.  The right coronary artery arises and distributes normally.  It is a dominant vessel and is normal.  Left ventricular angiography was performed in the RAO view.  This demonstrates normal left ventricular chamber size and contractility with normal systolic function.  Ejection fraction is estimated at 65 to 70%.  There is trace mitral insufficiency without prolapse.  The aortic valve appears normal.  FINAL INTERPRETATION: 1. Normal coronary anatomy. 2. Normal left ventricular function. DD:  07/10/00 TD:  07/10/00 Job: [id number]

## 2011-02-03 NOTE — Discharge Summary (Signed)
Whitney, Jacqueline                 ACCOUNT NO.:  [id number]   MEDICAL RECORD NO.:  [id number]          PATIENT TYPE:  INP   LOCATION:  1422                         FACILITY:  [facility]   PHYSICIAN:  Michaelyn Barter, M.D. DATE OF BIRTH:  02/11/1925   DATE OF ADMISSION:  11/18/2005  DATE OF DISCHARGE:  11/22/2005                                 DISCHARGE SUMMARY   PRIMARY CARE PHYSICIAN:  Wilson Singer, M.D.   FINAL DIAGNOSES:  1.  Gastroenteritis.  2.  Uncontrolled hypertension.  3.  Hyponatremia.   PROCEDURE:  A 2D echocardiogram completed November 20, 2005.   HISTORY OF PRESENT ILLNESS:  [name] is a 77 year old female who arrived at  the hospital with a chief complaint of diarrhea and dizziness, accompanied  by nausea and vomiting.  She stated that she had experienced the nausea,  vomiting, and diarrhea for at least four days prior to this admission.  She  experienced four bouts of diarrhea the day prior to this admission and on  the date of admission, she stated that it had occurred twice.  She had begun  to feel weak and dizzy.   PAST MEDICAL HISTORY:  Please see that dictated by Dr. Lonia Blood on November 18, 2005.   HOSPITAL COURSE:  Problem 1:  Gastroenteritis:  Following the patient's  admission into the hospital, she was started on aggressive IV fluid  hydration.  She had stool studies completed on November 19, 2005, which were  negative for C. difficile.  Stool cultures showed no suspicious colonies.  Over the course of her hospitalization, her diarrhea resolved.  Problem 2:  Hyponatremia:  The patient was provided 0.9 normal saline over  the course of her hospitalization.  Again, her initial sodium level was  noted to have been 120 just prior to admission reported by lab work done at  an outside facility.  In the ER at [facility], her sodium was found to be  at 122.  With normal saline, the patient's sodium gradually corrected  itself, and as of today, March 7th, it is  134.  Problem 3:  Uncontrolled hypertension:  The patient's blood pressure was  noted to have been significantly elevated, going up as high as 216/91 on  November 21, 2005.  The patient had additional antihypertensive medications  added to her regimen.  Of note, following the patient's admission to the  hospital, her antihypertensive medications were held secondary to concerns  that the patient may have been dehydrated.  As of today, the patient's blood  pressure is still above goal; however, it is better than it had been the  past couple of days.  This could be followed up closer by her primary care  physician once she is discharged from the hospital.  The patient also had a  2D echocardiogram completed during the course of this hospitalization.  The  final impression of that was overall left ventricular systolic function was  normal.  Left ventricular wall thickness was mildly increased.  Aortic valve  thickness was mildly increased.   Condition at the time  of discharge is improved.  Today, the patient states  that she feels pretty good.  Her nausea, vomiting, and diarrhea have all  resolved, and she states that she is ready to go home.   VITAL SIGNS TODAY:  Temperature 98.2, heart rate 62, respirations 18-20,  blood pressure 165/78.  She  is satting 95% on room air.   DISCHARGE MEDICATIONS:  1.  Norvasc 10 mg p.o. daily.  2.  Coreg 6.25 mg p.o. b.i.d.  3.  Protonix 40 mg daily.  4.  Norvasc 10 mg daily.  5.  Monopril 40 mg daily.  6.  Premarin 0.625 mg daily.  7.  Aspirin 325 mg daily.  8.  Calcium with vitamin D 400 mg daily.  9.  Multivitamin.  10. Colace 100 mg daily.  11. Methocarbamol 500 mg p.o. b.i.d.   Patient will be instructed to follow up with her primary care physician on  Friday, March 9th at 10:00 a.m.  She has instructed me that she already has  an appointment scheduled.      Michaelyn Barter, M.D.  Electronically Signed     OR/MEDQ  D:  11/22/2005  T:   11/23/2005  Job:  [id number]   cc:   Wilson Singer, M.D.  Fax: [phone]

## 2011-02-03 NOTE — H&P (Signed)
[name], [name]                 ACCOUNT NO.:  [id number]   MEDICAL RECORD NO.:  [id number]          PATIENT TYPE:  INP   LOCATION:  1422                         FACILITY:  [facility]   PHYSICIAN:  Lonia Blood, M.D.      DATE OF BIRTH:  May 09, 1925   DATE OF ADMISSION:  11/18/2005  DATE OF DISCHARGE:                                HISTORY & PHYSICAL   PRIMARY CARE PHYSICIAN:  Wilson Singer, M.D.   PRESENTING COMPLAINT:  Diarrhea and dizziness.   HISTORY OF PRESENT ILLNESS:  The patient is a 77 year old Caucasian female  with history of hypertension and arthritis, status post multiple surgeries  who presented with four-day history of nausea, vomiting and diarrhea.  The  patient stated she has been having diarrhea at least four times yesterday  and has been slowing down. Today she has had it only twice.  However, she  has been feeling weak and dizzy.  The vomiting has also stopped prior to  arrival in the hospital.  She could not remember specific contact.  She has  previously been doing all right.  Denied any new medications or eating  outside the house.   PAST MEDICAL HISTORY:  1.  Hypertension.  2.  Multiple knee surgeries with knee replacement on the left done by Dr.      Charlann Boxer in January 2005 for review of hardware.  3.  Also history of GERD.  4.  Osteoarthritis.  5.  Morbid obesity.  6.  The patient has prior history of hyponatremia in January 2005 with      sodium of 131.  7.  She also had postoperative anemia.  However, she was discharged with      hemoglobin of 10.5 after being admitted with hemoglobin of 10.6.   ALLERGIES:  The patient has no known drug allergies.   MEDICATIONS:  1.  Norvasc 10 mg daily.  2.  Monopril 40 mg daily.  3.  Hydrochlorothiazide 25 mg daily.  4.  Premarin 0.625 mg daily.  5.  Aspirin 325 mg daily.  6.  Calcium with vitamin D 500 mg daily.  7.  Multivitamins.  8.  Aciphex 20 mg daily.  9.  Colace 100 mg daily.  10. Methocarbamol 500 mg  b.i.d.   SOCIAL HISTORY:  The patient is married and lives with her husband.  Denied  any tobacco or alcohol use.   FAMILY HISTORY:  Her father died at an old age of coronary artery disease.  Also had seizures and CVA, hypertension, diabetes.  Her mother also had  history of CVA, coronary artery disease and diabetes.   REVIEW OF SYSTEMS:  Ten-point review of systems generally as in HPI.  GENERAL:  No fevers, no chills, no night sweats.  The patient, however,  denied any blurry vision, no seizures, no headaches.  RESPIRATORY:  The  patient denied any shortness of breath, cough, or hemoptysis.  CARDIOVASCULAR:  Denied any chest pain.  Denied any orthopnea or PND.  GI:  Per HPI.  MUSCULOSKELETAL: Denied any recent pain except for suspended  arthritis pain that  is chronic for her.   PHYSICAL EXAMINATION:  VITAL SIGNS:  Temperature 97, blood pressure 128/63,  pulse 50's, respiratory rate 25, 99% on room air.  GENERAL:  The patient looks weak but alert, oriented, in no acute distress.  HEENT:  PERRLA.  EOMI.  NECK:  Supple.  No JVD, no lymphadenopathy.  RESPIRATORY:  Good air entry bilaterally.  No wheezes or rhonchi.  CARDIOVASCULAR:  Regular rate and rhythm.  ABDOMEN:  Obese, nontender, with positive bowel sounds.  EXTREMITIES:  No edema, cyanosis or clubbing.   LABORATORY DATA:  White count 7.5, hemoglobin 10.9, with platelet count of  287.  Sodium 122, potassium 3.6, chloride 89, CO2 27, glucose 90, BUN 14,  creatinine 1.2, calcium 8.2.   ASSESSMENT:  This is a 77 year old female referred here from [facility]  where she went with four days of nausea and vomiting, diarrhea and what  appears to be dehydration.  The patient had lab work there that showed a  sodium of 120, but here 122.  From all indications the patient has been  having gastroenteritis, probably viral in nature and that has lead to some  form of dehydration.  Of note, however, she had chronic history of  hyponatremia  as indicated.  It is not clear whether her sodium was prior to  the onset of these symptoms.  Especially she is on hydrochlorothiazide which  may have been responsible for some form of hyponatremia.   PLAN:  1.  Gastroenteritis.  This will be managed with symptomatic treatment.      Would treat the patient with hydration and watchful care in the      hospital.  Her diarrhea and vomiting have already started declining but      will still isolate the patient based on the recent Norovirus epidemic.      Once her symptoms resolve, will then consider discharging her.  2.  Hyponatremia.  This is in concert with hypochloremia indicating that      this may be due to low voltage.  Will hydrate the patient effectively.      If she is not responding, will have to check her serial osmolality and      then do the work-up.  I doubt she has any SIADH at this point.  This may      be all drug related but in addition to the diarrhea.  Once her symptoms      resolve, I presume her sodium will rise on the chloride as well.  So I      will put her on saline.  She has already gotten a liter of normal saline      bolus.  I will check orthostasis however, on her.  Make sure she is not      profoundly dehydrated.  If she eats, we will give her more bolus of      fluids before we put her on scheduled dose and will follow her sodium      closely.  3.  Hypertension.  The patient's blood pressure is okay at this point.      However, with her ongoing nausea, vomiting and diarrhea, and since we      are going to make her only clear liquids, I will not put her on her      antihypertensives for now and will hold it and restart them accordingly.      It is probably not a good idea for the patient to  continue      hydrochlorothiazide with her hyponatremia anyway.  4.  Gastroesophageal reflux disease.  I will put her on PPI as per her home      dose. 5.  Osteoarthritis.  The patient is stable right now.  If needed, we  will      start her back on her pain medicines accordingly.      Lonia Blood, M.D.  Electronically Signed     LG/MEDQ  D:  11/18/2005  T:  11/19/2005  Job:  [id number]

## 2011-02-03 NOTE — Discharge Summary (Signed)
[name], [name]                           ACCOUNT NO.:  [id number]   MEDICAL RECORD NO.:  [id number]                   PATIENT TYPE:  INP   LOCATION:  0361                                 [facility]ACILITY:  [facility]   PHYSICIAN:  Melissa L. Ladona Ridgel, MD               DATE OF BIRTH:  May 07, 1925   DATE OF ADMISSION:  08/31/2003  DATE OF DISCHARGE:  09/01/2003                                 DISCHARGE SUMMARY   PRIMARY CARE PHYSICIAN:  Wilson Singer, M.D.   ADMITTING DIAGNOSES:  Atypical chest pain with abdominal pain and near  syncope, resolved.   DISCHARGE DIAGNOSES:  1. Noncardiac chest pain.  2. Abdominal pain without source.  3. Pulmonary embolus ruled out.  4. No deep venous thrombosis found.   DISCHARGE MEDICATIONS:  1. Lisinopril 40 mg p.o. daily.  2. Norvasc 5 mg p.o. daily.  3. Hydrochlorothiazide 25 mg p.o. daily.  4. Aciphex 20 mg p.o. daily.  5. Flonase as needed.  6. Phenergan 25 mg as needed.  7. Robaxin 500 mg p.o. b.i.d.  8. Calcium supplementation b.i.d.  9. Coumadin at discharge will be 2.5 mg until changed by her primary care     physician.  PT/INR should be drawn on Thursday, December 16 and results     faxed to the attending who is managing her Coumadin.   HISTORY OF PRESENT ILLNESS:  The patient is a 77 year old white female who  states that she had an episode of abdominal pain which extended into her  chest and down her arm.  She was diaphoretic and felt like she was going to  faint.  This happened on at least two episodes in the last week.  She is  status post left knee revisement and relates an atypical sounding chest pain  that needed to be examined for cardiac origin.  She received a CT of the  chest which ruled out pulmonary embolus and lower extremity Dopplers were  performed in the emergency room which were found to be negative for DVT.   HOSPITAL COURSE:  The patient was admitted.  She was evaluated with serial  cardiac enzymes which all were  negative.  The patient showed no arrhythmias  on the monitor overnight.  Her thyroid was tested and found to be within  normal limits.  She was fluid restricted while in the hospital to correct a  postoperative hyponatremia.  At discharge her laboratory values are as  follows:  Her PT is 24.5 with an INR of 3.1 status post last p.m. Coumadin  being held.  Her CBC is 6.5 for white count, hemoglobin 12.1, hematocrit  35.8, platelet count 399,000.  Sodium 130 with a potassium of 3.6, chloride  of 96, CO2 of 30, BUN of 12, creatinine of 0.9, and glucose of 97.  Her  creatinine is 0.9 and her calcium correction is 9.1.  TSH was noted to be  1.144 and a UA/C&S is pending.  On discharge the patient was instructed to  resume all of her home medications which have been previously stated.  She  has Tylenol No.3 for pain.  Currently, she is on a nonweightbearing status  of her left leg until Madlyn Frankel. Charlann Boxer, M.D. changes her status.  She is  instructed to follow the wound care instructions given by Madlyn Frankel. Charlann Boxer,  M.D. office.   FOLLOWUP:  The patient is to make an appointment with Wilson Singer,  M.D. in one to two weeks to further evaluate cardiac origin for her  discomfort should this be warranted.  She is instructed to call Madlyn Frankel.  Charlann Boxer, M.D. office to confirm her next appointment.  She is to have her  PT/INR drawn Thursday, December 16 with the laboratory results faxed to  physician who has been monitoring her Coumadin.   PHYSICAL EXAMINATION:  On the day of discharge the patient's examination  remained unchanged from admission.  VITAL SIGNS:  Temperature 97.3, pulse 72, blood pressure 154/74.  GENERAL:  She is in no acute distress.  HEENT:  Her pupils are equal, round, and reactive to light.  Extraocular  muscles are intact.  Her mucous membranes are moist.  NECK:  Supple.  There is no JVD, no lymph nodes, and no bruits.  CHEST:  Clear to auscultation.  There is no rhonchi, rales,  or wheezes.  CARDIOVASCULAR:  Regular rate and rhythm.  Positive S1, S2.  No S3 or S4.  She has a 2/6 systolic ejection murmur at the left sternal border and  axilla.  ABDOMEN:  Soft, nontender, nondistended with positive bowel sounds.  EXTREMITIES:  The left knee shows a clean, dry, and intact incision site  that is closed with Steri-Strips.  There is minimal erythema.  Her brace is  intact.  There is trace edema over the lower shin.  Otherwise, she has 2+  pulses and her power is 5/5 in all extremities except for the braced leg  which cannot be fully examined.  NEUROLOGIC:  She is awake, alert, and oriented.   DISCHARGE INSTRUCTIONS:  As stated above, she is to follow up with Wilson Singer, M.D. and Madlyn Frankel. Charlann Boxer, M.D.   CONDITION ON DISCHARGE:  Stable.                                               Melissa L. Ladona Ridgel, MD    MLT/MEDQ  D:  09/01/2003  T:  09/01/2003  Job:  [id number]   cc:   Wilson Singer, M.D.  [address]  [place]  [place] [address]  Fax: [phone]   Madlyn Frankel. Charlann Boxer, M.D.  Fax: [phone]

## 2011-02-03 NOTE — Discharge Summary (Signed)
[name], [name]                 ACCOUNT NO.:  [id number]   MEDICAL RECORD NO.:  [id number]          PATIENT TYPE:  INP   LOCATION:  6524                         FACILITY:  [facility]   PHYSICIAN:  Peter M. Swaziland, M.D.  DATE OF BIRTH:  1925-08-07   DATE OF ADMISSION:  08/06/2006  DATE OF DISCHARGE:  08/07/2006                                 DISCHARGE SUMMARY   HISTORY OF PRESENT ILLNESS:  [name] is a 77 year old white female who  presents with refractory chest pain.  [place] and developed epigastric and lower sternal chest  pain described as pressure and tightness.  This radiated up into her throat.  She was evaluated at a hospital in [place], [place], and then  discharged to home.  She subsequently had recurrent pain of a similar nature  on the day of admission and she was admitted for further evaluation.  The  patient did have a cardiac catheterization in October 2001 which was  unremarkable.  She does have a history of hypertension and acid reflux  disease.  She has had prior cholecystectomy.  For details of her past  medical history, social history, family history, physical exam, please see  admission history and physical.   LABORATORY DATA:  Her chest x-ray showed no active disease.  Initial ECG  showed decreased R wave voltage of V1 and V2, but subsequent ECG showed  normal R wave voltage and there were no acute ST-T wave changes.  White  count was 7700, hemoglobin 12.2, hematocrit 37.9, platelets 337,000.  Coags  were normal.  Sodium 136, potassium 4, chloride 99, CO2 of 33, glucose 91,  BUN 7, creatinine 0.7, total protein 5.9, albumin 3.2.  All other liver  function studies were normal.  Point of care cardiac enzymes were negative  x3 and subsequent cardiac enzymes were negative x2.  Cholesterol is 152,  triglycerides 27, HDL 71, LDL of 76.   HOSPITAL COURSE:  The patient was admitted to telemetry.  She was  begun on  IV nitroglycerin and IV heparin.  She was continued on her other  medications.  She had no recurrent chest pain.  She ruled out for myocardial  infarction.  On August 07, 2006, she underwent cardiac catheterization.  This demonstrated normal coronary anatomy and normal left ventricular  function.  There was no significant change compared to October 2001.  Based  on these findings, we felt that her pain was noncardiac.  We recommended  continuing on her antireflux measures, and she was discharged home the same  day in stable condition.   DISCHARGE DIAGNOSES:  1. Chest pain, noncardiac.  2. Gastroesophageal reflux disease.  3. Hypertension.  4. Status post cholecystectomy.  5. Chronic back pain.   DISCHARGE MEDICATIONS:  1. Lisinopril 40 mg b.i.d.  2. Cardizem 60 mg b.i.d.  3. Hydrochlorothiazide 25 mg daily.  4. Premarin 0.625 mg daily.  5. Coated aspirin 325 mg daily.  6. Os-Cal 500 mg b.i.d.  7. Robaxin 500 mg b.i.d. p.r.n.  8. AcipHex  20 mg per day.  9. Allegra 180 mg daily.  10.Quinine 325 mg p.r.n.  11.Colace 100 mg b.i.d.  12.Zyrtec 10 mg daily.  13.Coreg 6.25 mg b.i.d.  14.Celebrex 200 mg daily.   DISCHARGE STATUS:  Stable.           ______________________________  Peter M. Swaziland, M.D.     PMJ/MEDQ  D:  08/07/2006  T:  08/07/2006  Job:  [id number]   cc:   Wilson Singer, M.D.

## 2011-02-03 NOTE — Discharge Summary (Signed)
[name], [name]                           ACCOUNT NO.:  [id number]   MEDICAL RECORD NO.:  [id number]                   PATIENT TYPE:  INP   LOCATION:  0464                                 FACILITY:  [facility]   PHYSICIAN:  Philips J. Montez Morita, M.D.             DATE OF BIRTH:  1925/09/08   DATE OF ADMISSION:  04/28/2002  DATE OF DISCHARGE:  05/03/2002                                 DISCHARGE SUMMARY   ADMISSION DIAGNOSES:  1. Joint pain left leg.  2. Osteoarthritis left lower extremity.  3. Hypertension.  4. Esophageal reflux.  5. Obesity.   DISCHARGE DIAGNOSES:  1. Joint pain left leg.  2. Osteoarthritis left lower extremity, status post left total knee     arthroplasty.  3. Hypertension.  4. Esophageal reflux.  5. Obesity.   PROCEDURE:  The patient was taken to the operating room on April 28, 2002,  and underwent a left total knee replacement arthroplasty.  Surgeon was Dr.  [name], assistant was Cherly Beach, P.A.C.  Surgery was done  under general anesthesia.  Hemovac drain x 1 was placed at the time of  surgery.   HISTORY OF PRESENT ILLNESS:  The patient is a 77 year old female with a long  history of left knee pain despite conservative treatment including  nonsteroidal injections and activity modifications.  Radiographs in the  office revealed osteoarthritis of the left knee.  Physical exam showed no  loss of antalgic gait, had decreased range of motion or crepitus on range of  motion.  It was felt she would benefit from undergoing a left total knee  arthroplasty.  The risks and benefits as well as the procedure were  explained to the patient, and she elected to proceed.   LABORATORY DATA:  CBC on admission showed a hemoglobin of 14.0, hematocrit  of 41.5, white blood cell count of 6.2.  serial H&Hs were followed  throughout the hospital stay.  Her hemoglobin had declined to 8.9 and  hematocrit to 26.2 on May 01, 2002.  However, on May 02, 2002,  hemoglobin was 11.6 and hematocrit was 34.1 following a transfusion.  Differential on admission was all within normal limits.  Coagulation studies  on admission showed a PT slightly low at 12.0 on admission.  At the time of  discharge her PT was 24.8 and INR was 2.7 on Coumadin therapy.  Routine  chemistry on admission showed sodium low at 128 and chloride low at 93.  It  was followed up on April 29, 2002.  Sodium had risen to 130 but was still  low, and glucose was slightly high at 133.  In the office on admission it  was all within normal limits.  Patient's blood type was A positive with  antibody screen negative.   EKG on admission revealed normal sinus rhythm with first-degree AV block,  with septal infarct with age undetermined.   Preoperative chest  and knee films on April 22, 2002:  Knee revealed  osteoarthritis with the most severe involvement of the medial compartment.  The chest revealed no acute cardiopulmonary disease.   HOSPITAL COURSE:  The patient was admitted to [facility] and  underwent the above-stated procedure without complication.  The patient  tolerated the procedure well and was allowed to return to the recovery room  and then the orthopedic floor for routine postoperative care.  Hemovac drain  was placed at the time of surgery.  On postoperative day #1 the patient was  resting comfortably, and her pain was well controlled.  She had no  complaints at this time.  On postoperative day #2 the patient was sitting in  a chair, doing well.  Hemovac drain was discontinued on postoperative day  #1.  On postoperative day #2 CPM was initiated to 30 degrees only, and the  patient was weightbearing as tolerated.  On postoperative day #3 the patient  was sitting in chair, feeling slightly fatigued.  Hemoglobin was 8.9 at this  time.  She was transfused with two units of packed red blood cells.  On  postoperative day #4, May 02, 2002, the patient was resting  comfortably  today and has had no complaint.  Felt better after two units of blood.  May 03, 2002, the patient was doing well and felt she was ready for  discharge.   DISPOSITION:  The patient was discharged home on May 03, 2002.   DISCHARGE MEDICATIONS:  1. Coumadin per pharmacy dosing.  2. Percocet 1-2 tablets p.o. q.4-6h. p.r.n. pain.  3. Robaxin 500 mg 1 p.o. q.8h. p.r.n. spasm.  4. Trinsicon 1 capsule p.o. t.i.d.   DIET:  As tolerated.   ACTIVITY:  Weightbearing as tolerated.  No lifting greater than 10 pounds.  No car-riding except home and office.  [facility] for home care.   FOLLOW-UP:  The patient is to follow up on May 08, 2002.  She needs to  call [phone].   CONDITION ON DISCHARGE:  Stable and improved.     Clarene Reamer, P.A.-C.                   Philips J. Montez Morita, M.D.    SW/MEDQ  D:  05/28/2002  T:  05/28/2002  Job:  [id number]

## 2011-02-03 NOTE — Assessment & Plan Note (Signed)
OFFICE VISIT   [name], [name]  DOB:  1925/03/10                                       02/26/2007  ZOXWR#:[id number]   The patient presents today for followup of her recent renal arteriogram  and right renal artery angioplasty.  She has had increasingly difficult-  to-control hypertension and underwent a duplex in our office suggesting  stenosis in her mid right renal artery.  On arteriography, she was found  to have fibromuscular dysplasia of her right renal artery.  I discussed  this with the patient and recommended that we proceed with angioplasty  to, hopefully, improve flow.  She did undergo balloon angioplasty, and  we did not place a stent due to the fibromuscular dysplasia.  She had  good initial result by arteriogram.  She reports that she has since had  lower blood pressure, and today her blood pressure is 134/75.  I  explained that we would certainly need to see a trend in this, and,  hopefully, she will continue to have good blood pressure response.  We  will see her again in our office in 3 months with a repeat duplex of her  renal artery.   [name], M.D.  Electronically Signed   TFE/MEDQ  D:  02/25/2007  T:  02/26/2007  Job:  [id number]   cc:   Peter M. Swaziland, M.D.  [name], M.D.

## 2011-02-03 NOTE — Cardiovascular Report (Signed)
[name], [name]                 ACCOUNT NO.:  [id number]   MEDICAL RECORD NO.:  [id number]          PATIENT TYPE:  INP   LOCATION:  6524                         FACILITY:  [facility]   PHYSICIAN:  Peter M. Swaziland, M.D.  DATE OF BIRTH:  September 20, 1924   DATE OF PROCEDURE:  08/07/2006  DATE OF DISCHARGE:                              CARDIAC CATHETERIZATION   INDICATIONS FOR PROCEDURE:  A 77 year old white female with a history of  hypertension presents with recurrent anginal-type symptoms at rest.  She had  previously been admitted overnight at a hospital in [place] and now presents  with recurrent chest pain of similar quality.   PROCEDURES:  1. Left heart catheterization.  2. Coronary and left ventricular angiography.   EQUIPMENT USED:  6-French 4 cm right and left Judkins catheter, 6-French  pigtail catheter, 6-French arterial sheath.   MEDICATIONS:  Local anesthesia with 1% Xylocaine.   CONTRAST:  95 mL of Omnipaque.   HEMODYNAMIC DATA:  Aortic pressure was 138/54 with a mean of 87 mmHg.  Left  ventricular pressure was 147 with EDP of 15 mmHg.  By pullback there is no  significant aortic valve gradient.   ANGIOGRAPHIC DATA:  The left coronary arises and distributes normally.  The  left main coronary is normal.   The left anterior descending artery is normal.  It gives rise to a small  first diagonal branch and moderate second diagonal branch, both of which are  normal.   The left circumflex coronary artery gives rise to two small obtuse marginal  vessels and then terminates in the AV groove.  It is normal.   The right coronary is a large, dominant vessel and is normal.   Left ventricular angiography performed in the RAO view demonstrates normal  left ventricular size and contractility.  There are no signs of segmental  wall motion abnormalities, and ejection fraction is estimated at 65%.  There  is mild mitral insufficiency.  The aorta appears normal.   FINAL  INTERPRETATION:  1. Normal coronary anatomy.  2. Normal left ventricular function.  3. Comparison with prior cardiac catheterization October 2001 shows no      significant change.   PLAN:  Based on these findings, I do not feel that her symptoms are cardiac-  related.  Recommend continuing with antireflux measures.           ______________________________  Peter M. Swaziland, M.D.     PMJ/MEDQ  D:  08/07/2006  T:  08/07/2006  Job:  [id number]   cc:   Wilson Singer, M.D.

## 2011-02-03 NOTE — Op Note (Signed)
[name], [name]                           ACCOUNT NO.:  [id number]   MEDICAL RECORD NO.:  [id number]                   PATIENT TYPE:  OBV   LOCATION:  0478                                 FACILITY:  [facility]   PHYSICIAN:  [name], M.D.                DATE OF BIRTH:  [birth year]   DATE OF PROCEDURE:  07/24/2002  DATE OF DISCHARGE:                                 OPERATIVE REPORT   PREOPERATIVE DIAGNOSIS:  Severe rotator cuff disease, left shoulder.   POSTOPERATIVE DIAGNOSIS:  Severe rotator cuff disease, left shoulder with  acromioclavicular joint arthritis.   PROCEDURE:  Distal clavicle excision, anterior acromioplasty and rotator  cuff repair.   SURGEON:  [name], M.D.   ASSISTANT:  Druscilla Brownie. Idolina Primer, P.A.   DESCRIPTION OF PROCEDURE:  After suitable general anesthesia, she was  positioned in the shoulder holder, and prepped and draped routinely.  An  incision was made over the distal clavicle and the anterior acromion about 1  inch into the deltoid and the deltoid was taken off of the anterior  acromion.  There was noted to be some unusual attachments compatible with a  partial injury noted on the MRI with a thickened whitish material at its  attachment.  The distal clavicle was excised.  The anterior acromion was  incised with a beveled saw blade to protect it with a Cobb elevator.   A larger anterior portion was freed up as best as possible and could be  moved some.  The smaller posterior shelf did not respond well to freeing up,  this being done with a Cobb elevator under on it and on top of it with  finger motion as well.  I elected then to release the attachment above so we  could shift them enough to give some compression to keep the humeral head  from riding up.  Selecting these new sites, the new sites were burred with a  bur to freshen up and then part of the humeral head underneath where they  would hopefully stick down were freshened up as well.   The posterior leaf  was sutured in place with one single suture of #1 Ethibond through drill  hole in the bone.  The forward release had a rocking mattress suture brought  through two drills holes and then an additional single suture to clearly  lock it to the bone as it was the larger and the one we could bring in  better.  Then going back up to the V, we were able to put some sutures of #1  PDS to approximate the apex although we left the cap as we went from  proximal to distal and this was where we brought in the graft jacket to span  the gap suturing it in with 2-0 PDS.  About six sutures of #1 PDS were  placed between the two leaves and multiple  sutures with one side being a  running suture were used to attach the graft jacket.  A nice coverage of the  whole hip was present.  Three drill holes in the acromion with #1 PDS to  reattach the deltoid.  Bone wax on the end of the distal clavicle.  With  release of the bur, there was some splatter.  Thorough irrigation was done  at that time, and at the end of procedure 500 cc of irrigation was done.   Prior to the start of the surgery, scalene block by anesthesia had given her  a good numb arm.  She was placed into an abduction sling and went to  recovery room in good condition.                                               [name], M.D.    PC/MEDQ  D:  07/24/2002  T:  07/24/2002  Job:  [id number]

## 2011-02-03 NOTE — Discharge Summary (Signed)
[facility]  Patient:    [name], [name] Visit Number: [id number] MRN: [id number]          Service Type: SUR Location: 4W 0482 01 Attending Physician:  Rocky Crafts Dictated by:   Judeth Porch. Perkins, P.A.-C. Admit Date:  05/10/2001 Discharge Date: 05/11/2001                             Discharge Summary  ADMISSION DIAGNOSES: 1. Draining hematoma, right knee. 2. Hypertension. 3. History of asthma. 4. Primary atrioventricular block. 5. History of diverticulitis disease. 6. Hiatal hernia. 7. Gastroesophageal reflux disease.  DISCHARGE DIAGNOSES: 1. Open hematoma prepatella right knee, status post debridement of hematoma,    irrigation, and loose closure. 2. Hypertension. 3. History of asthma. 4. Primary atrioventricular block. 5. History of diverticulitis disease. 6. Hiatal hernia. 7. Gastroesophageal reflux disease.  PROCEDURES:  The patient was taken to the OR on May 10, 2001, and underwent debridement of hematoma and irrigation with loose closure.  Surgeon: Dr. Montez Morita.  CONSULTATIONS:  None.  HISTORY OF PRESENT ILLNESS:  The patient is a 77 year old female who fell on her right knee on April 09, 2001.  She has previously undergone a right total knee arthroplasty in November 1997.  She had no prior problems up until her fall.  She did quite well after her surgery; however, since her fall she developed a large hematoma, and it has been draining at the blister site.  Due to its persistence, the patient was brought into the hospital for surgical debridement.  LABORATORY DATA:  CBC on admission:  Hemoglobin 13.7, hematocrit 39.8, white cell count 7.2, red cell count 4.34.  Differential showed neutrophils 79, lymphs 12, monos 8, eosinophils 1, basophils 0.  PT and PTT on admission were 12.6 and 28 respectively, with an INR of 0.9.  Chemistry panel on admission showed slightly low sodium of 130; remaining chemistry panel all  within normal limits.  Urinalysis on admission was negative except minimally elevated urobilinogen of 1.0.  Chest x-ray dated May 09, 2001:  Submaximal inspiration.  Slight compression fracture deformity of T7; otherwise, no significant change.  No active disease.  I do not see an EKG report on this chart.  HOSPITAL COURSE:  The patient was admitted to [facility] and taken to the OR and underwent the above-stated procedure without complications.  The patient tolerated the procedure well and was later transferred to the recovery room and then to the orthopedic care.  Vital signs were followed.  The patient was started on p.o. analgesic for pain control, and following surgery her home medications were restarted.  On the following day of surgery she was doing quite well.  Wound was clean.  She was seen and evaluated by Dr. Montez Morita and discharged home.  DISCHARGE PLAN: 1. The patient is discharged home on May 09, 2001. 2. Discharge diagnoses:  Please see above. 3. Discharge medications:  The patient is sent home on pain medications    written by Dr. Montez Morita (these are not listed on her discharge instruction    sheet). 4. Diet:  As tolerated. 5. Activity:  Up as tolerated. 6. Follow-up:  The patient is to follow up on the Friday following discharge.    Call the office for an appointment at [phone].  DISPOSITION:  Home.  CONDITION ON DISCHARGE:  Improved. Dictated by:   Alexzandrew L. Perkins, P.A.-C. Attending Physician:  Rocky Crafts DD:  06/12/01 TD:  06/12/01 Job: [id number]

## 2011-02-03 NOTE — Discharge Summary (Signed)
[name], [name]                           ACCOUNT NO.:  [id number]   MEDICAL RECORD NO.:  [id number]                   PATIENT TYPE:  INP   LOCATION:  0482                                 [facility]ACILITY[facility]   PHYSICIAN:  [name], MD                  DATE OF BIRTH:  10/14/1924   DATE OF ADMISSION:  06/12/2002  DATE OF DISCHARGE:  06/16/2002                                 DISCHARGE SUMMARY   ADMISSION DIAGNOSES:  1. Persistent postoperative fistula.  2. Osteoarthritis.  3. Knee replacement, right knee.  4. Hypertension.  5. Esophageal reflux.   DISCHARGE DIAGNOSES:  1. Persistent postoperative fistula.  2. Osteoarthritis.  3. Knee replacement, right knee.  4. Hypertension.  5. Esophageal reflux.   PROCEDURE:  None.   CONSULTATIONS:  None.   HISTORY OF PRESENT ILLNESS:  Briefly, the patient is a 77 year old female  seen by Dr. Montez Morita post total knee replacement arthroplasty on April 28, 2002, and did very well postoperatively.  Recently, specifically June 08, 2002, she began noticing varying from the distal part of her total knee  surgical site.  She had no trauma to the knee just barely skin opened up and  she began having drainage.  She saw Dr. Montez Morita on June 10, 2002, and  started her on p.o. antibiotic therapy.  When she was seen on June 12, 2002, she had a little more drainage of seropurulent type material.  She  also had some mild swelling and some generalized erythema.  It was felt that  this patient developed cellulitis secondary to possible stitch abscess  versus sinus tract.  It was decided to admit her for aggressive IV  antibiotic therapy, warm soaks and observation.   LABORATORY DATA:  CBC on admission was all within normal limits.  Differential on admission was all within normal limits.  ESR on admission  was within normal limits.  Routine chemistry showed sodium slightly low at  134.   No radiographs and no EKG were performed  on admission.   HOSPITAL COURSE:  The patient was admitted to [facility] on  June 12, 2002, for antibiotic therapy and observation of suture abscess  versus sinus tract of her right total knee incision.  June 13, 2002,  the patient was seen on hospital day #1 on IV Ancef.  The patient was much  improved.  She was to stay over the weekend.  June 14, 2002, she was  seen by orthopedics.  The patient was doing well with no complaints.  Drainage was decreasing.  Erythema was decreasing.  IV antibiotics were  continued at this time.  June 15, 2002, she was seen by orthopedics.  The patient was ambulating the hallway. She states that her wound is  improved.  She had minimal erythema and edema to the distal wound.  A small  area of granulation of  distal incision.  IV antibiotics were continued.  June 16, 2002, the patient was seen by orthopedics, comfortable.  All  erythema had resolved.  The patient had good range of motion. She was  discharged on p.o. Keflex for four weeks.   DISPOSITION:  The patient was discharged home on June 16, 2002.   DISCHARGE MEDICATION:  Keflex 500 mg one p.o. q.i.d. for four weeks.   DIET:  As tolerated.   ACTIVITY:  The patient is ambulation as tolerated.   WOUND CARE:  As needed for drainage.   FOLLOW UP:  The patient is to follow up with Dr. Montez Morita in two weeks.   CONDITION ON DISCHARGE:  Stable and improved.     Clarene Reamer, P.A.-C.                   [name], MD    SW/MEDQ  D:  06/30/2002  T:  06/30/2002  Job:  [id number]

## 2011-02-03 NOTE — H&P (Signed)
[name], [name]                           ACCOUNT NO.:  [id number]   MEDICAL RECORD NO.:  [id number]                   PATIENT TYPE:  INP   LOCATION:  0108                                 FACILITY:  [facility]   PHYSICIAN:  Melissa L. Ladona Ridgel, MD               DATE OF BIRTH:  [birth year]   DATE OF ADMISSION:  08/31/2003  DATE OF DISCHARGE:                                HISTORY & PHYSICAL   PRIMARY CARE PHYSICIAN:  Wilson Singer, M.D.   CHIEF COMPLAINT:  Atypical chest pain and abdominal discomfort.   This is a 77 year old white female who is status post left total knee  replacement completed at the end of November. She states that approximately  9:30 this morning she developed unusual abdominal pain that progressed up  her body into her chest causing a sensation of diaphoresis and near syncope.  She was helped to the bed because of the sensation of near fainting and once  lying on the bed the pain progressed into her chest and down her right arm.  She states that she was diaphoretic and slightly nauseous. She relates that  approximately last Friday she had a similar episode that she did not bring  to the attention of her husband. She states that she was lying in the bed  when she became acutely aware of discomfort with some associated shortness  of breath so much so that she actually had sit on the edge of the bed.  The  symptoms went away after a five to ten minute period of time, and she felt  that perhaps this was similar to old symptoms of reflux; but today's episode  was significant enough to cause her to contact her surgeon who recommended  that she come into the emergency room for a chest CT to rule out a pulmonary  embolus.   REVIEW OF SYSTEMS:  The patient denies any vomiting, but did state she had  some nausea associated with the symptoms. She denies any changes in the  vision, headache, or palpitations. She does not describe any melena,  hematochezia, or dysuria. She  does have some back spasms that she treats  with Robaxin and she has been treating her pain as needed with Vicodin  p.r.n.   PAST MEDICAL HISTORY:  Significant for hypertension and arthritis.   PAST SURGICAL HISTORY:  She has had two knee surgeries to the left knee, the  most recent was in November.  She still has a brace and Steri-Strips over  the incision. She has had multiple knee surgeries on the right. She had her  gallbladder removed, appendectomy, and tonsillectomy. She has had back  surgery and left shoulder surgery. She has had a hysterectomy. Dr. Swaziland  did a catheterization back in 2001, which revealed an ejection fraction of  65% to 70% with normal coronaries. She does have slight mitral regurgitation  without prolapse.  ALLERGIES:  No known drug allergies.   MEDICATIONS:  1. Lisinopril 40 mg daily.  2. Norvasc 5 mg p.o. daily.  3. Hydrochlorothiazide 25 mg p.o. daily.  4. Aciphex 20 mg p.o. daily.  5. Flonase p.r.n.  6. Phenergan 25 mg p.r.n.  7. Robaxin 500 mg p.o. b.i.d.  8. Calcium p.o. b.i.d.  9. Coumadin (her dose tonight was to be 2.5 mg, which she was instructed to     take after taking 5 mg the previous nights).   PHYSICAL EXAMINATION:  VITAL SIGNS: On admission her temperature was 96  degrees, blood pressure 123/72, pulse 62, respirations 20. She is 100% on  room air.  GENERAL: She is in no acute distress.  HEENT: Her pupils are equal, round, and reactive to light. Extraocular  muscles are intact.  Mucous membranes are moist.  She has mild skin pallor,  but no conjunctival pallor.  NECK: Supple. There is no JVD, lymph nodes, or bruits. She has no  thyromegaly.  CHEST: Clear to auscultation with no rales, rhonchi, or wheezes.  CARDIOVASCULAR: Regular rate and rhythm with positive S1 and S2.  No S3 or  S4. She does have a 1-2/6 systolic ejection murmur at the left sternal  border and radiating to the axilla.  ABDOMEN: Soft, nontender, and nondistended  with positive bowel sounds.  EXTREMITIES: Her left knee is with a clean, dry, and intact incision, bound  by Steri-Strips, with minimal erythema. Her brace is intact. She has mild  edema over the skin of her right shin with some warmth. There are 2+ pulses.  NEUROLOGIC: She is awake, alert, and oriented times three. Cranial nerves II-  XII are intact. There are no focal deficits noted in all limbs; however, it  is difficult to assess the left leg secondary to her brace which limits the  exam.   Her laboratories on admission reveal a PT-INR of 26/3.5, total CK 37 with CK-  MB 1.6, troponin 0.01. Her basic metabolic panel reveals sodium of 127,  potassium 4.4, CO2 93, chloride 29, BUN 15, creatinine 0.9, glucose 122,  calcium  9.8. CBC is pending.   Her EKG shows normal sinus rhythm with no acute ST-T wave changes.   ASSESSMENT/PLAN:  This is a 77 year old white female with two episodes of  atypical chest discomfort, the second with radiation down the right arm, and  presyncopal symptoms.  1. Cardiovascular.  The patient has had a cardiac catheterization in 2001     with clean coronaries and her symptoms are slightly atypical for cardiac     source; however, this needs to be ruled out. We will continue her     lisinopril, Norvasc, and hydrochlorothiazide. We will rule out her     cardiac enzymes over the next 24 hours and will obtain a cardiac consult     in the morning.  Hemoglobin and hematocrit should be checked to assess     for possible anemia causing symptoms of presyncope. Will also check a 2-D     echo in the a.m.  She has mild hyponatremia noted postoperatively.  She     has been on fluid restriction since that time.  2. Pulmonary. A chest CT is without findings of pulmonary embolism. There is     some interstitial lung disease noted and a Doppler ultrasound of the     lower extremity reveals no deep venous thrombosis (DVT). 3. Gastrointestinal. Abdominal pain related to this  event is slightly  atypical. We will follow up and need to rule out any gastrointestinal     source, especially a bleeding source. It is unlikely that this is     possible secondary to no melena or hematochezia. We will continue proton     pump inhibitor.  4. Genitourinary. We will check urinalysis and culture and sensitivity.  5. Pain management. We will use her Robaxin or the equivalent and Percocet     for pain management.  6. Orthopedic. We will continue with the brace to her leg and adjust her     Coumadin. Her INR is currently 3.5, so I will ask pharmacy to follow.  My     recommendation would be to hold tonight's dose secondary to slightly     elevated INR.                                               Melissa L. Ladona Ridgel, MD    MLT/MEDQ  D:  08/31/2003  T:  08/31/2003  Job:  [id number]   cc:   Wilson Singer, M.D.  [address]  [place]  [place] [address]  Fax: [phone]

## 2011-02-03 NOTE — Discharge Summary (Signed)
[name], [name]                           ACCOUNT NO.:  [id number]   MEDICAL RECORD NO.:  [id number]                   PATIENT TYPE:  INP   LOCATION:  0476                                 FACILITY:  [facility]   PHYSICIAN:  Madlyn Frankel. Charlann Boxer, M.D.               DATE OF BIRTH:  1925/06/01   DATE OF ADMISSION:  08/18/2003  DATE OF DISCHARGE:  08/21/2003                                 DISCHARGE SUMMARY   ADMISSION DIAGNOSES:  1. 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  2. Hypertension.  3. Gastroesophageal reflux disease.   DISCHARGE DIAGNOSES:  1. 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  2. Retained hardware.  3. Hypertension.  4. Gastroesophageal reflux disease.  5. Postoperative anemia.   OPERATION:  On August 18, 2003 the patient underwent:  (1) removal of  staples, removal of hardware of the left knee, (2) ball exchange with  Osteonics 15X #5 mm tibial tray insert, (3) extensor mechanism  reconstruction utilizing copper wire and Arthrex 30 mm x 6.5 mm screw and  washer, Clarene Reamer, P.A.-C. assisted.   CONSULTATIONS:  None.   BRIEF HISTORY:  This very pleasant 77 year old lady underwent a total knee  replacement arthroplasty in 1993 by [name], M.D.  That procedure was  complicated by an extensor mechanism incompetence with the tibial tubercle  at that time.  The patient did fairly well over the years.  However, she now  has extensor lag of 50 to 55 degrees as well as instability of the knee.  After much discussion, it was decided the patient would benefit with  surgical intervention and was admitted for the above procedure.   HOSPITAL COURSE:  The patient tolerated the surgical procedure quite well.  She was very familiar with physical therapy as well as occupational therapy  even with the restrictions necessary to the repair.  She remained awake and  alert  postoperatively.  Due to the nature of the repair, the patient had to  remain totally nonweightbearing to the left lower extremity and no flexion.  Bledsoe brace was fitted and locked in full extension.  The patient  accomplished transfers quite well.  Had no critical incidents while in the  hospital.  She did have a mild anemia postoperatively.  We also noted that  she had some mild hyponatremia and I told her to restrict fluids at home,  use Gatorade, etc., and follow with her family physician, Dr. Karilyn Whitney, as  needed.   On the day of discharge, the patient's pain was being maintained with p.o.  analgesics.  The dressing was changed.  The wound was dry and Steri-Strips  were placed.  Her husband is building a ramp at home and if this ramp is  completed she is to be discharged to home.  Due to the fact that  she is  nonweightbearing on the left lower extremity and has to keep it locked in  full extension, a wheelchair is necessary for her getting about.  She will  also need a walker at home as well.  There was a question at the time of  discharge as to whether she could shower without the brace.  This was  negative as the patient cannot chance any flexion of the knee as for  weightbearing.   LABORATORY DATA IN THE HOSPITAL:  Hematologically, her CBC with differential  was essentially within normal limits.  Final hemoglobin is 11.6 with  hematocrit of 34.2.  Blood chemistries showed a hyponatremia of 129.  However, when repeated on August 21, 2003 it was 130.  Urinalysis was  negative.  Chest x-ray from Whitney 24, 2004 on the chart showed no active lung  disease, question mild changes of bronchitis.  Electrocardiogram from Whitney  24, 2004 showed sinus rhythm with a first-degree AV block.   CONDITION ON DISCHARGE:  Improved, stable.   PLAN:  The patient is transferred/discharged to her home in the care of her  family and to continue with home physical therapy and home health with   [place].  She is to continue on Coumadin protocol which was initiated  postop.  Use dry dressings to the left knee p.r.n..  No range of motion and  no flexion of the knee.  She is to keep it in the Bledsoe brace at all  times.  It may be removed only when she is lying in bed, still, for hygiene  of the skin of the left leg and dressing changes.   DISCHARGE MEDICATIONS:  1. Robaxin 500 mg.  2. Vicodin for pain.  3. Coumadin.   She is to return to see Madlyn Frankel. Charlann Boxer, M.D. in approximately 10 to 14 days  from discharge.  She will be nonweightbearing to the left lower extremity  without range of motion for six weeks postop.     [name].                 Madlyn Frankel Charlann Boxer, M.D.    DLU/MEDQ  D:  08/21/2003  T:  08/21/2003  Job:  [id number]   cc:   Wilson Singer, M.D.  [address]  [place]  [place] [address]  Fax: [phone]

## 2011-02-03 NOTE — H&P (Signed)
NAMENOEMY, HALLMON                           ACCOUNT NO.:  [id number]   MEDICAL RECORD NO.:  [id number]                   PATIENT TYPE:  INP   LOCATION:  NA                                   FACILITY:  [facility]   PHYSICIAN:  Madlyn Frankel. Charlann Boxer, M.D.               DATE OF BIRTH:  Sep 17, 1925   DATE OF ADMISSION:  10/16/2003  DATE OF DISCHARGE:                                HISTORY & PHYSICAL   CHIEF COMPLAINT:  Continued left knee pain.   HISTORY OF PRESENT ILLNESS:  Patient is a 77 year old female status post  left total knee arthroplasty and status post repair of extensor mechanism of  the left knee.  Patient followed up with Dr. Charlann Boxer in clinic where x-rays  were taken and noted that the screw has backed out some from the bone.  Dr.  Charlann Boxer feels that it is best at this point to proceed with definitively  repairing the extensor mechanism of the left knee.  The patient agrees.  Risks and benefits of this surgery have been discussed with the patient.  The patient wishes to proceed.   PAST MEDICAL HISTORY:  1. Hypertension.  2. Gastroesophageal reflux disease.  3. Osteoarthritis.   PAST SURGICAL HISTORY:  1. Open repair extensor mechanism left knee.  2. Left total knee arthroplasty.  3. Right total knee arthroplasty.  4. Left rotator cuff repair.  5. Lumbar back surgery.   MEDICATIONS:  1. Lisinopril 20 mg one p.o. every morning.  2. Norvasc 5 mg one p.o. every morning.  3. Hydrochlorothiazide 25 mg one p.o. every morning.  4. Aciphex 20 mg one p.o. every morning.  5. Aspirin 325 mg one p.o. every evening which she has been instructed to     stop taking.  6. Robaxin 500 mg one p.o. at bedtime.  7. Os-Cal one p.o. b.i.d.  8. Multivitamin one p.o. daily.   ALLERGIES:  No known drug allergies.   SOCIAL HISTORY:  The patient denies any tobacco or alcohol use.  She is  married.  She lives in a [place] house with two steps entering house.  Her  husband will be her caregiver after  surgery.   FAMILY HISTORY:  Father deceased coronary artery disease, cerebrovascular  accident, hypertension, and diabetes.  Mother cerebrovascular accident,  coronary artery disease, and diabetes.   REVIEW OF SYSTEMS:  GENERAL:  Denies fevers, chills, night sweats, bleeding  tendencies.  CNS:  Denies blurry or double vision, seizures, headaches,  paralysis.  RESPIRATORY:  Denies shortness of breath, productive cough or  hemoptysis.  CV:  Denies chest pain, angina, orthopnea.  GI:  Denies nausea,  vomiting, diarrhea, constipation, melena, bloody stools.  GU:  Denies  dysuria, hematuria, discharge.  MUSCULOSKELETAL:  Pertinent to HPI.   PHYSICAL EXAMINATION:  VITAL SIGNS:  Temperature 98.2, pulse 98,  respirations 16, blood pressure 140/78.  GENERAL:  Well-developed, well-nourished 77 year old female.  HEENT:  Normocephalic, atraumatic.  Pupils equal, round and reactive to  light.  NECK:  Supple.  No carotid bruit noted.  CHEST:  Clear to auscultation bilaterally.  No wheezes or crackles.  HEART:  Regular rate and rhythm.  No murmurs, rubs, or gallops.  ABDOMEN:  Soft, nontender, nondistended.  Positive bowel sounds x4.  EXTREMITIES:  She has good active extension of the left lower extremity,  minimal edema, she is neurovascularly intact to the left lower extremity.  SKIN:  No rashes or lesions.   X-RAY:  Screw and washer backing out of bone.   IMPRESSION:  1. Hardware failure left knee.  2. Hypertension.  3. Gastroesophageal reflux disease.  4. Osteoarthritis.   PLAN:  Patient will be admitted to [facility] on October 16, 2003  and undergo removal of hardware of the left knee and repair of the tibial  tendon with either a staple or a large __________ screw with washer by Dr.  Durene Romans.     Clarene Reamer, P.A.-C.                   Madlyn Frankel Charlann Boxer, M.D.    SW/MEDQ  D:  10/15/2003  T:  10/15/2003  Job:  [id number]   cc:   Wilson Singer, M.D.  [address]  [place]  [place] [address]  Fax: [phone]

## 2011-02-03 NOTE — H&P (Signed)
[name], [name]                 ACCOUNT NO.:  [id number]   MEDICAL RECORD NO.:  [id number]          PATIENT TYPE:  EMS   LOCATION:  MAJO                         FACILITY:  [facility]   PHYSICIAN:  Cassell Clement, M.D. DATE OF BIRTH:  [birth year]   DATE OF ADMISSION:  08/06/2006  DATE OF DISCHARGE:                                HISTORY & PHYSICAL   CHIEF COMPLAINT:  Chest pain.   HISTORY:  This is a 77 year old, married, Caucasian female admitted through  the emergency room because of chest pain.  She had the onset of chest pain  on the morning of Saturday, August 04, 2006, as she and her husband were  starting a trip back from [place], [place], to [place].  When  they reached [facility], the pain was so severe that they stopped at the  hospital and were admitted and kept overnight.  EKGs there showed poor R-  wave progression, and her cardiac enzymes were apparently negative.  The  initial plan was for her to undergo a cardiac catheterization in [place]  today, but she persuaded the physicians there to allow her to return home  last night since she was feeling better; however, this morning her chest  pain recurred at about 8 a.m. and she went to Dr. Patty Sermons office who  referred her on to the emergency room with instructions to call Dr. Peter  Swaziland.  Dr. Swaziland had last seen the patient in 2001; at which time, he did  a cardiac catheterization which was normal at that time.  The chest pain  that the patient describes is that of a choking feeling in the upper chest  which spreads to the jaw and neck.  There is nausea but no vomiting.  She  did have diaphoresis when the pain began 2 days ago but not when it recurred  this morning.   PAST MEDICAL HISTORY:  Positive for hypertension, multiple back surgeries,  osteoarthritis, pinched nerves, cholecystectomy, and hysterectomy.   PAST SURGICAL HISTORY:  1. Tonsillectomy, 1946.  2. Appendectomy, 1947.  3.  Hysterectomy, 1960.  4. Disc surgery of the back, 1977.  5. Fusion of the back, 1979.  6. Right knee replacement, 1997.  7. Gallbladder, October 2000.  8. Cardiac catheterization, July 10, 2000.  9. A left knee replacement, 2003; repeat surgery on the left knee in 2004      and a third time in 2005.  10.She has also had glaucoma and cataract surgery.   PRESENT MEDICATIONS:  1. Lisinopril 40 mg twice a day.  2. Diltiazem 60 mg twice a day.  3. Hydrochlorothiazide 25 mg, presently on hold.  4. Premarin 0.625 mg daily.  5. Coated aspirin 325 mg daily.  6. Calcium/oyster shell 500 mg twice a day.  7. Multivitamin once a day.  8. Methocarbamol muscle relaxer 500 mg twice a day.  9. AcipHex 20 mg daily.  10.Stool softener 100 mg two daily.  11.Allegra 180 mg daily.  12.Quinine 325 mg at bedtime.  13.Celebrex 200 mg daily.  14.She recently had been on Coreg 6.25 mg twice a day  which was held      because of bradycardia when she was hospitalized in [place].  15.She also has Zyrtec 10 mg on hand.   ALLERGIES:  NO KNOWN DRUG ALLERGIES.   FAMILY HISTORY:  Both parents had heart problems but died of strokes.   SOCIAL HISTORY:  She has been married twice.  She has been married the  second time 15 years after her first husband died.  She has 3 children, ages  13, 54, and 77 by her first husband.  The patient never smoked or drank any  alcohol.  She worked for [employer] on [employer] for more than 30  years.   REVIEW OF SYSTEMS:  GASTROINTESTINAL:  No heartburn since starting AcipHex.  GENITOURINARY:  There are no symptoms.  RESPIRATORY:  No cough or sputum  production.  The remainder of review of systems is negative and dictated on.   PHYSICAL EXAM:  VITALS:  Blood pressure 191/83, pulse 60, respirations are  normal.  HEENT:  Negative carotids.  Normal jugular venous pressure.  The thyroid is  normal.  LUNGS:  Clear.  HEART:  No murmur, gallop, or rub.  ABDOMEN:   Soft and nontender without masses.  EXTREMITIES:  Good pulses.  No phlebitis or edema.   Her electrocardiogram shows sinus bradycardia at 56.  She has poor R-wave  progression V1 through V3.  She has no acute changes.   Chest x-ray shows no active disease.   Blood work done in the emergency room is unremarkable, including cardiac  enzymes.   IMPRESSION:  1. Prolonged chest pain, rule out acute coronary artery syndrome, rule out      a left-sided infarction.  2. Hypertensive cardiovascular disease.  3. Osteoarthritis.  4. Status post cholecystectomy.  5. Status post hysterectomy.   DISPOSITION:  She is being admitted to [facility] Telemetry.  She will be on  intravenous nitro, intravenous heparin.  Nitro was already started by the ER  physician.  She will be given daily aspirin, beta blockers, and ACE  inhibitors.  We will anticipate cardiac catheterization on Tuesday, August 07, 2006, by Dr. Delane Ginger in Dr. Elvis Coil absence.           ______________________________  Cassell Clement, M.D.     TB/MEDQ  D:  08/06/2006  T:  08/07/2006  Job:  [id number]   cc:   Wilson Singer, M.D.  Vesta Mixer, M.D.

## 2011-02-03 NOTE — H&P (Signed)
[facility]  Patient:    [name], [name] Visit Number: [id number] MRN: [id number]          Service Type: Attending:  Philips J. Montez Morita, M.D. Dictated by:   Irena Cords, P.A.-C Adm. Date:  05/10/01   CC:         Lilly Cove, M.D.   History and Physical  DATE OF BIRTH:  September 18, 1925  SOCIAL SECURITY NUMBER:  [id number]  CHIEF COMPLAINT:  Right knee blister.  HISTORY OF PRESENT ILLNESS:  [name] is a 77 year old female who fell on her right knee on around April 09, 2001.  She has undergone a previous right total knee arthroplasty in November 1997.  She had no prior problems to this knee before her fall.  She had done well since surgery.  After her fall, she developed a large hematoma and had draining blisters at the site since.  She has been seen by Dr. Michael Litter. Montez Morita for evaluation and because of the persistence, recommended she may benefit from surgical intervention.  REVIEW OF SYSTEMS:  She denies any diplopia, blurred vision or headaches.  No shortness of breath, cough or chest pain.  No earache, sore throat or rhinorrhea.  No nausea, vomiting, diarrhea or constipation.  No abdominal pain.  She did have a brief period of upset stomach over one night; this has since resolved.  No numbness or tingling in her extremities.  No dysuria, hematuria or bright red blood per rectum.  No melena.  PAST MEDICAL HISTORY:  Significant for hypertension and history of asthma with no recent flare-ups or need for treatment.  First-degree A-V block.  History of diverticulitis.  Hiatal hernia.  History of blood transfusion several times in the past with surgeries on her back and knee.  History of GERD and skin carcinoma.  PAST SURGICAL HISTORY:  Significant for total knee arthroplasty in November 1997, right knee arthroscopy, March of 1997, tonsillectomy, appendectomy, hysterectomy, cholecystectomy and back surgeries x 2 with a laminectomy and  diskectomy and subsequently a fusion.  ALLERGIES:  No known drug allergies.   CURRENT MEDICATIONS:  1. Os-Cal.  2. Multivitamins.  3. Celebrex 200 mg b.i.d.  4. Glucosamine.  5. Hydrocodone -- rarely uses p.r.n.  6. Premarin 0.625 mg q.d.  7. Hydrochlorothiazide 25 mg q.d.  8. Norvasc 5 mg q.d.  9. Monopril 20 mg b.i.d. 10. Enteric-coated aspirin 325 mg q.d.  Patient has held her dose for the last     two days. 11. Robaxin. 12. Aciphex 20 mg q.d. 13. Cephalexin 500 mg t.i.d. x 10 days -- started yesterday.  SOCIAL HISTORY:  She is married.  She has three grown children who are healthy and well.  She denies alcohol or tobacco use.  Her primary physician is Dr. Lilly Cove.  FAMILY HISTORY:  Significant for hypertension in her daughter.  Both her parents had heart disease as well as strokes.  PHYSICAL EXAMINATION:  VITAL SIGNS:  Temperature 97.0, pulse 80, respiratory rate 20 and unlabored, blood pressure 160/86.  GENERAL:  This is a well-developed, well-nourished female in no acute distress.  HEENT:  Head is atraumatic, normocephalic.  Oropharynx is clear without redness, exudate or lesions.  Pupils are equal, round and reactive to light and extraocular movements are grossly intact.  NECK:  Supple without cervical lymphadenopathy.  No carotid bruits.  CHEST:  Clear to auscultation bilaterally with no wheezes or crackles.  HEART:  Regular rate and rhythm with no murmurs, rubs, or gallops.  ABDOMEN:  Soft, nontender, nondistended, with no masses and hepatosplenomegaly.  BREASTS:  Not examined as not pertinent to present illness.  GENITOURINARY:  Not examined as not pertinent to present illness.  SKIN AND EXTREMITIES:  She has a huge draining hematoma over the prepatellar region on the right knee.  There are no signs or symptoms of secondary infection.  No surrounding redness or warmth.  DP and PT 2+ on the affected leg.  Motor and sensory are grossly intact on  the affected leg as well. Surgical incision overall is well-healed.  RADIOGRAPHIC STUDIES:  X-rays of the right knee show a little separation of the distal tip of the patella.  IMPRESSION:  1. Draining hematoma, right knee.  2. Hypertension.  3. History of asthma.  4. Primary atrioventricular block.  5. History of diverticulitis with a hiatal hernia and gastroesophageal reflux     disease.  PLAN:  Patient will be admitted to [facility] to undergo a debridement and irrigation of the right knee by Dr. Montez Morita on May 10, 2001. Preoperative labs and signed surgical consents will be obtained.  I answered all questions for her today. Dictated by:   Irena Cords, P.A.-C Attending:  Philips J. Montez Morita, M.D. DD:  05/09/01 TD:  05/10/01 Job: [id number]

## 2011-02-03 NOTE — Op Note (Signed)
NAMENEERA, TENG                           ACCOUNT NO.:  [id number]   MEDICAL RECORD NO.:  [id number]                   PATIENT TYPE:  INP   LOCATION:  0454                                 FACILITY:  [facility]   PHYSICIAN:  Madlyn Frankel. Charlann Boxer, M.D.               DATE OF BIRTH:  1924/10/12   DATE OF PROCEDURE:  10/16/2003  DATE OF DISCHARGE:                                 OPERATIVE REPORT   PREOPERATIVE DIAGNOSIS:  Failed attempt at extensor mechanism repair  following left total knee replacement complicated by extensor mechanism  disruption.   POSTOPERATIVE DIAGNOSIS:  Failed attempt at extensor mechanism repair  following left total knee replacement complicated by extensor mechanism  disruption.   FINDINGS:  The patient was noted to have scar tissue attachments over the  extensor mechanism but had not had extensor mechanism reattachment or  healing of the tibial tubercle region.  There was evidence of fiber wire  tearing most likely secondary to loose screw and spiked washer.   PROCEDURE:  Revision attempt and repair of extensor mechanism with repeat  Krackow suture through the remaining patella tendon which was then tied to a  bicortical 0.60 cancellous screw placed in the proximal tibia with a second  0.45 cannulated screw passed in the proximal tibial region to apply downward  force.   SURGEON:  Madlyn Frankel. Charlann Boxer, M.D.   ASSISTANT:  Clarene Reamer, P.A.-C.   ANESTHESIA:  General.   ESTIMATED BLOOD LOSS:  Minimal.   TOURNIQUET TIME:  Per anesthesia notes.   COMPLICATIONS:  None apparent.   DRAINS:  Times one.   INDICATIONS FOR PROCEDURE:  The patient is a very pleasant 77 year old  female who is status post a left total knee replacement that was complicated  by an extensor mechanism disruption or failure off the proximal aspect of  the tibia.  She was initially seen, evaluated and noted to have a large  extensor lag.  We had attempted to deal with this with a  conservative  fashion with a non-lock hinged knee brace except that this was not  tolerated.  She went to the operating room at the end of November for  attempted repair.  She was followed up in clinic and noted to have the  cancellous screw was backing out indicating potential failure.  Note  clinically though that she was able to actively extend her knee at that  time, but given the fact that the screw was backing out at least wanted to  remove this and evaluate the knee.   DESCRIPTION OF PROCEDURE:  The patient was brought to the operating theater.  Once adequate anesthesia and preoperative antibiotics with 1 gram of Ancef  were administered, the patient was positioned supine with a proximal thigh  tourniquet placed.  The left lower extremity was then prepped and draped in  sterile fashion.  The previous incision was excised and allowed for  exposure  of the knee.  It was evident that the patella had migrated proximally  slightly which was evident radiographically.  The patient was noted to have  some scar tissue which probably was all that was maintaining continuity of  the extensor mechanism distally.  After debriding this and making a medial  parapatellar arthrotomy it was evident that the patella tendon had not  healed to the proximal tibia.  Based on this, all hardware and sutures were  removed.  There was noted to be tearing and splitting of the previously used  fiber wire suture.  This was most likely due to this scar and spiked washer.  Following debridement and removal of suture, a #2 fiber wire was then re-  weaved in a Krackow fashion with double limbs coming out distally.  In an  attempt to provide some stability, rather than going through bone tunnels or  attempting to wrap the sutures around the prosthesis, I decided to place a  0.60 partially threaded cancellous screw into the proximal tibia from  anterior posterior to provide a post for the suture.  This was placed and   the suture then tied around this post.  A washer was placed and the sutures  placed underneath the washer and the screw tightened down.  Following this  to apply a posterior force onto the patella tendon to allow it to have as  much contact with the bony surface as possible, a .40 cannulated screw was  then passed through the tendon in an anterior posterior direction. A guide  wire was placed and utilized and drilling was carried out using a soft  tissue protector to provide as much protection to the suture as possible.  I  followed placement of this with a double washer technique utilized to  increase surface area to increase the downward pressure.  Following this the  wound was copiously irrigated with normal saline solution.   Note that in order to provide as much surface area as possible fro the  patella tendon to be healed, the tendon was noted to be pulled down.  There  was evidence of patella baja.  With the technique in place, the knee was  flexed and passively was flexed to 45 degrees without evidence of tearing or  pulling of the sutures or any stress on the post.  Despite this though, the  plan will be for the patient to be locked in extension for another six  weeks.  Following irrigation of the wound the medial and lateral retinacular  tissues were reapproximated using #1 PDS.  The subcutaneous layer was  reapproximated using 2-0 Vicryl and the skin was reapproximated using 4-0  subcutaneous stitch.  The patient did receive a second dose of antibiotics  at the time of tourniquet letdown to provide additional coverage.  The wound  was cleaned, dried and dressed sterilely with Steri-Strips, dressings,  sponges and a knee immobilizer. She tolerated this procedure without  complications and was transferred to the ward.                                               Madlyn Frankel Charlann Boxer, M.D.   MDO/MEDQ  D:  10/18/2003  T:  10/18/2003  Job:  [id number]

## 2011-02-03 NOTE — Op Note (Signed)
TNAMECLIO, GERHART                          ACCOUNT NO.:  [id number]   MEDICAL RECORD NO.:  [id number]                   PATIENT TYPE:  INP   LOCATION:  X003                                 FACILITY[facility]  [facility]   PHYSICIAN:  Deloris Ping. Montez Morita, M.D.              DATE OF BIRTH:  [birth year]   DATE OF PROCEDURE:  04/28/2002  DATE OF DISCHARGE:                                 OPERATIVE REPORT   PREOPERATIVE DIAGNOSIS:  Degenerative arthritis, left knee.   POSTOPERATIVE DIAGNOSIS:  Degenerative arthritis, left knee.   OPERATIVE PROCEDURE:  Left total knee replacement.   SURGEON:  Deloris Ping. Montez Morita, M.D.   ASSISTANT:  Druscilla Brownie. Idolina Primer, P.A.-C.   DESCRIPTION OF PROCEDURE:  After suitable general anesthesia, the leg was  prepped and draped routinely.  It was exsanguinated and upper thigh  tourniquet inflated to 350 mmHg.  A skin incision was made, and the  tourniquet was not holding, as his blood pressure went up to 160.  Tourniquet was let down, reexsanguinated.  We then continued the incision  with skin flaps, and the tourniquet was not holding again, as the blood  pressure went up to 180.  The tourniquet was let down again, was then put  back up and after it had been up for 11 minutes, and put up to 425 where it  remained for about 1 hour and 30 minutes before being let down and held.  Medial parapatellar incision with the typical medial approach, relaxing and  externally rotating the tibia, excising the portions of the medial and  lateral menisci.  Central hole drilled in the femur, and it looked like a 7  would be an ideal size; 10 mm were taken off the distal femur.  We then used  the new [employer] cutting guide, trying to adjust the rotation to fit the  medial and lateral epicondyles, turned out to be about six degrees of  external rotation.  On checking, it looked like it might notch the lateral  cortex, so we elected to raise it up 2 mm.  We set it at that point and then  brought in a 7 cutting guide, cut the anterior cortex, and it may have just  a millimeter or two high, so we could have left it at the original site, but  it was too awkward to try to put down, as the holes had already been  created, and there was plenty of sparing of the dorsal cortex.  The  posterior condyles were cut off, and the chamfering was done.  The tibia was  brought forward, the remaining menisci and cruciate ligaments removed,  spines cut off the tibia, central drill hole, to use a 5 tibia which sit  better than the 7; 10 mm was taken off of the normal lateral side with a 0  degree cut.  We then cut the groove for the femur, inserted the  7 femur, 5  tibia, and lined it up and marked the lines.  At this point, we checked the  patellar rotation, and the patella was laterally subluxed and would not hold  with the no-thumbs technique, despite the fact that it was put in six  degrees of external rotation.  We thought perhaps that it may have been just  a little fullness in the suprapatellar area.  The patella itself measured 22  mm, and I elected to put an 8 mm prosthesis on.  We drilled down 8 mm with  the recess patellar guide and took a saw to flush it off at 8, drilled the  holes for the 8 mm prosthesis, and placed it to the medial side and, again,  it was tight.  We did a lateral release and again, even with the lateral  release, seemed to be tight, so I anticipated that we would have to do a  partial patella tendon transfer.  At this point, the material was removed.  The back of the femur had been checked previously, and there no osteophytes  or loose material, and thorough irrigation was done.  Addendum:  Prior  inserting the trial, the trough was cut for the patella and for the central  post.  It was a plug of bone preceded by Gelfoam was used to plug up the  central hole in the femur.  Cement was mixed while the area was being  irrigated, and we then cemented in order, the  tibia first, the femur, and  then the patella and held until set.  Trial at that point again revealed the  patella not stable without a little bit of pressure from the thumb, so I  thought I would transfer the lateral half of the patellar tendon underneath  the medial and insert it onto the medial side.  That part was cut off with a  bit of bone attached to it.  We then went ahead and put the final tray in  and as we brought the tibia forward to put in the final tray, the remaining  medial half of the patella tendon avulsed.  We then replaced the tray with  the 12 mm tray for the Scorpio posterior cruciate-sacrificing.  It was  stable in medial and lateral plane, excellent flexion and extension, and  brought up with Techmatic staples.  Elected then since the medial  compartment was avulsed to just transfer the tendon whole as a unit  medially, using an osteotome to freshen up the medial side, put one  Techmatic staple in the main lateral portion with the bone attached and  second Techmatic staple distally to it and then on the medial fragment  without a bone, we did one smaller Techmatic staple and one suture distal to  that.  Excellent stability with the tendon, excellent stability of the  patella.  Routine wound closure then carried out with #1 PDS, 0 and 3-0  coated Vicryl and a running Monocryl with Steri-Strips on the skin.  About  200 cc of blood after the tourniquet was let down, nice compression  dressing.  She goes to recovery in good condition.                                               Deloris Ping. Montez Morita, M.D.    PJC/MEDQ  D:  04/28/2002  T:  04/30/2002  Job:  [id number]

## 2011-02-03 NOTE — H&P (Signed)
[name], [name]                           ACCOUNT NO.:  [id number]   MEDICAL RECORD NO.:  [id number]                   PATIENT TYPE:  INP   LOCATION:  NA                                   FACILITY:  [facility]   PHYSICIAN:  Raynelle Fanning B. Payton Emerald, P.A.                 DATE OF BIRTH:  06-Mar-1925   DATE OF ADMISSION:  04/28/2002  DATE OF DISCHARGE:                                HISTORY & PHYSICAL   CHIEF COMPLAINT:  Left knee pain.   HISTORY OF PRESENT ILLNESS:  This is a 77 year old female with a long  history of left knee pain despite conservative treatment, including  nonsteroidal injections, activity modifications.  Radiographs in the office  revealed osteoarthritis to the left knee.  On physical examination it was  noted that she walks with an antalgic gait, had decreased range of motion  and crepitus on range of motion.  It was felt she would benefit from  undergoing a left total knee arthroplasty.  The risks and benefits, as well  as the procedure were explained to the patient.  She elected to proceed.   ALLERGIES:  No known drug allergies.   MEDICATIONS:  1. Lisinopril 40 mg one p.o. q.d.  2. Norvasc 5 mg one p.o. q.d.  3. Hydrochlorothiazide 25 mg one p.o. q.d.  4. Premarin 0.625 mg one p.o. q.d.  5. Aciphex 20 mg one p.o. q.d.  6. Aspirin 325 mg one p.o. q.d.  7. Flonase nasal spray p.r.n.  8. Celebrex 400 mg one p.o. q.d.  9. Methocarbamol 500 mg one p.o. q.8h. p.r.n.  10.      Os-Call 500 three p.o. q.d.  11.      Glucosamine with chondroitin.   PAST MEDICAL HISTORY:  1. Hypertension.  2. Gastroesophageal reflux disease.  3. Osteoarthritis.  4. Obesity.  5. Diverticulosis.  6. History of asthma.   SOCIAL HISTORY:  The patient is married.  She denies any alcohol or tobacco  use.  She is retired from [employer].   PAST SURGICAL HISTORY:  1. Tonsillectomy.  2. Appendectomy.  3. Hysterectomy.  4. Diskectomy.  5. Right total knee arthroplasty.  6.  Cholecystectomy.   FAMILY HISTORY:  Father deceased at age 41, history of cerebrovascular  accident.  Mother deceased at age 57, history of cerebrovascular accident.   REVIEW OF SYMPTOMS:  GENERAL:  Positive for postnasal drainage with cough,  no fevers or chills.  PULMONARY:  Dyspnea on exertion.  No hemoptysis or orthopnea.  CARDIOVASCULAR:  No chest pain or angina.  GASTROINTESTINAL:  No nausea, vomiting, or diarrhea.  Positive for  constipation.  GENITOURINARY:  No hematuria, dysuria, or discharge.  ENDOCRINE:  No history of hypo or hyperthyroidism.  MUSCULOSKELETAL:  Osteoarthritis as discussed above.   PHYSICAL EXAMINATION:  GENERAL:  Alert and oriented, well-developed, well-  nourished 77 year old female.  VITAL SIGNS:  Pulse 60, respirations  12, blood pressure 170/90, 140/90.  HEENT:  Normocephalic, atraumatic.  Oropharynx is clear.  NECK:  Supple, negative for cervical lymphadenopathy.  CHEST:  Clear to auscultation bilaterally.  No wheezes, rhonchi, or rales.  BREASTS:  Not pertinent to present illness.  HEART:  S1 and S2, negative for murmurs, rubs, or gallops.  Regular rate and  rhythm.  ABDOMEN:  Soft, nontender, positive bowel sounds.  EXTREMITIES:  Please see history of present illness for physical examination  of the left knee.  SKIN:  Intact, no rashes or lesions appreciated on examination.   LABORATORY DATA:  Preoperative labs and x-rays are pending.   IMPRESSION:  1. Osteoarthritis of the left knee.  2. Hypertension.  3. Gastroesophageal reflux disease.  4. Osteoarthritis.  5. Diverticulosis.  6. History of asthma.   PLAN:  The patient is scheduled for a left total knee arthroplasty with Dr.  [name].                                                 Bradd Canary Clabe Seal.    JBB/MEDQ  D:  04/22/2002  T:  04/27/2002  Job:  [id number]

## 2011-02-03 NOTE — Telephone Encounter (Signed)
Received 24 hr monitor report showing 3-4 second pauses. Called pt and advised that we will set her up w/ EP doctor but would have to call them Mon since it is after 5. Advised if she passes out or gets real dizzy over week-end to go to [facility] ER. States she hasn't passed out but does get lightheaded. Will call her back Monday w/ app.

## 2011-02-06 ENCOUNTER — Encounter: Payer: Self-pay | Admitting: Internal Medicine

## 2011-02-06 ENCOUNTER — Ambulatory Visit (INDEPENDENT_AMBULATORY_CARE_PROVIDER_SITE_OTHER): Payer: Medicare Other | Admitting: Internal Medicine

## 2011-02-06 ENCOUNTER — Inpatient Hospital Stay (HOSPITAL_COMMUNITY): Payer: Medicare Other

## 2011-02-06 ENCOUNTER — Inpatient Hospital Stay (HOSPITAL_COMMUNITY)
Admission: AD | Admit: 2011-02-06 | Discharge: 2011-02-09 | DRG: 308 | Disposition: A | Discharge status: Home / Self Care | Payer: Medicare Other | Source: Ambulatory Visit | Attending: Internal Medicine | Admitting: Internal Medicine

## 2011-02-06 DIAGNOSIS — I5031 Acute diastolic (congestive) heart failure: Secondary | ICD-10-CM | POA: Diagnosis present

## 2011-02-06 DIAGNOSIS — G40909 Epilepsy, unspecified, not intractable, without status epilepticus: Secondary | ICD-10-CM | POA: Diagnosis present

## 2011-02-06 DIAGNOSIS — Z9849 Cataract extraction status, unspecified eye: Secondary | ICD-10-CM

## 2011-02-06 DIAGNOSIS — I4891 Unspecified atrial fibrillation: Secondary | ICD-10-CM

## 2011-02-06 DIAGNOSIS — M549 Dorsalgia, unspecified: Secondary | ICD-10-CM | POA: Diagnosis present

## 2011-02-06 DIAGNOSIS — I10 Essential (primary) hypertension: Secondary | ICD-10-CM | POA: Diagnosis present

## 2011-02-06 DIAGNOSIS — R32 Unspecified urinary incontinence: Secondary | ICD-10-CM | POA: Diagnosis present

## 2011-02-06 DIAGNOSIS — M949 Disorder of cartilage, unspecified: Secondary | ICD-10-CM | POA: Diagnosis present

## 2011-02-06 DIAGNOSIS — Z7901 Long term (current) use of anticoagulants: Secondary | ICD-10-CM

## 2011-02-06 DIAGNOSIS — I509 Heart failure, unspecified: Secondary | ICD-10-CM | POA: Diagnosis present

## 2011-02-06 DIAGNOSIS — I5032 Chronic diastolic (congestive) heart failure: Secondary | ICD-10-CM | POA: Insufficient documentation

## 2011-02-06 DIAGNOSIS — E871 Hypo-osmolality and hyponatremia: Secondary | ICD-10-CM | POA: Diagnosis present

## 2011-02-06 DIAGNOSIS — M899 Disorder of bone, unspecified: Secondary | ICD-10-CM | POA: Diagnosis present

## 2011-02-06 DIAGNOSIS — G8929 Other chronic pain: Secondary | ICD-10-CM | POA: Diagnosis present

## 2011-02-06 DIAGNOSIS — D6859 Other primary thrombophilia: Secondary | ICD-10-CM | POA: Diagnosis present

## 2011-02-06 DIAGNOSIS — I080 Rheumatic disorders of both mitral and aortic valves: Secondary | ICD-10-CM | POA: Diagnosis present

## 2011-02-06 DIAGNOSIS — K219 Gastro-esophageal reflux disease without esophagitis: Secondary | ICD-10-CM | POA: Diagnosis present

## 2011-02-06 DIAGNOSIS — Z9981 Dependence on supplemental oxygen: Secondary | ICD-10-CM

## 2011-02-06 DIAGNOSIS — I495 Sick sinus syndrome: Secondary | ICD-10-CM | POA: Diagnosis present

## 2011-02-06 DIAGNOSIS — I2789 Other specified pulmonary heart diseases: Secondary | ICD-10-CM | POA: Diagnosis present

## 2011-02-06 DIAGNOSIS — I5033 Acute on chronic diastolic (congestive) heart failure: Secondary | ICD-10-CM

## 2011-02-06 LAB — COMPREHENSIVE METABOLIC PANEL
AST: 49 U/L — ABNORMAL HIGH (ref 0–37)
CO2: 28 mEq/L (ref 19–32)
Chloride: 93 mEq/L — ABNORMAL LOW (ref 96–112)
Creatinine, Ser: 0.82 mg/dL (ref 0.4–1.2)
GFR calc Af Amer: >60 mL/min (ref >60)
GFR calc non Af Amer: >60 mL/min (ref >60)
Glucose, Bld: 91 mg/dL (ref 70–99)
Total Bilirubin: 0.3 mg/dL (ref 0.3–1.2)

## 2011-02-06 LAB — CBC
MCH: 31.1 pg (ref 26.0–34.0)
MCHC: 33.8 g/dL (ref 30.0–36.0)
Platelets: 175 10*3/uL (ref 150–400)
RDW: 14.4 % (ref 11.5–15.5)

## 2011-02-06 LAB — APTT
aPTT: 49 seconds — ABNORMAL HIGH (ref 24–37)
aPTT: 51 seconds — ABNORMAL HIGH (ref 24–37)

## 2011-02-06 LAB — PROTIME-INR
INR: 6.03 (ref 0.00–1.49)
Prothrombin Time: 55.5 seconds — ABNORMAL HIGH (ref 11.6–15.2)

## 2011-02-06 NOTE — Assessment & Plan Note (Signed)
The patient has symptomatic bradycardia and tachy/brady syndrome.  I would therefore recommend pacemaker implantation at this time.  Risks, benefits, alternatives to pacemaker implantation were discussed in detail with the patient today. The patient understands that the risks include but are not limited to bleeding, infection, pneumothorax, perforation, tamponade, vascular damage, renal failure, MI, stroke, death,  and lead dislodgement and wishes to proceed. We will therefore schedule the procedure once her CHF is stable.

## 2011-02-06 NOTE — Assessment & Plan Note (Signed)
Resume home medicine and follow.

## 2011-02-06 NOTE — Progress Notes (Signed)
[name] is a pleasant 77 y.o. WF patient with newly diagnosed atrial fibrillation and tachycardia/ bradycardia syndrome who presents today for EP consultation.  The patient was diagnosed with atrial fibrillation several days ago after presenting to her PCP with dizziness and SOC.  She was evaluated by Norma Fredrickson.  She was initiated on coumadin and had an event monitor placed.  On the event monitor, she was found to have tachy/brady syndrome with heart rates up to 114 bpm as well as pauses of 4 seconds.  The patient reports progressive SOB and palpitations.  Presently, she is quite dypsneic at rest and unable to walk more than 50 ft.  She has also noticed progressive BLE edema since last being seen by Ms Tyrone Sage.  She reports orthopnea and in our office, he sats drop to 86% with lying supine and 82% when talking.  She denies symptoms of chest pain, presyncope, syncope, or neurologic sequela. The patient is tolerating medications without difficulties and is otherwise without complaint today.   Past Medical History  Diagnosis Date  . Pneumonia 04/2010  . Hypertension   . Renal artery stenosis 2008    STATUS POST ANGIOPLASTY  . GERD (gastroesophageal reflux disease)   . Back pain     CHRONIC  . Atrial fibrillation     persistent  . Hyponatremia     improved  . Seizure disorder     on anti-epileptic therapy  . Urinary incontinence   . Osteopenia   . S/P partial hysterectomy   . SOB (shortness of breath)   . SIADH (syndrome of inappropriate ADH production)     h/o HYPONATREMIA ; LIKELY RELATED TO HER LUNG PROCESS   Past Surgical History  Procedure Date  . Cholecystectomy   . Back surgery     X2  . Total knee arthroplasty     RIGHT AND LEFT KNEE  . Tonsillectomy   . Appendectomy   . Cardiac catheterization 2007    Normal  . Knee arthroscopy     LEFT KNEE  . Cataract extraction   . Shoulder surgery   . Renal angioplasty 01/2007    RIGHT    Current Outpatient Prescriptions    Medication Sig Dispense Refill  . aliskiren (TEKTURNA) 150 MG tablet Take 150 mg by mouth daily.        Marland Kitchen amLODipine-olmesartan (AZOR) 10-40 MG per tablet Take 1 tablet by mouth daily.        Marland Kitchen aspirin 325 MG EC tablet Take 81 mg by mouth daily.       . Calcium Citrate (CITRACAL PO) Take by mouth daily.        . calcium-vitamin D (OSCAL WITH D) 500-200 MG-UNIT per tablet Take 1 tablet by mouth 2 (two) times daily.        . cetirizine (ZYRTEC) 10 MG tablet Take 10 mg by mouth daily.        . cholecalciferol (VITAMIN D) 1000 UNITS tablet Take 1,000 Units by mouth daily.        . divalproex (DEPAKOTE) 500 MG EC tablet Take 500 mg by mouth at bedtime.        Marland Kitchen lisinopril (PRINIVIL,ZESTRIL) 40 MG tablet Take 40 mg by mouth 2 (two) times daily.        . meloxicam (MOBIC) 7.5 MG tablet Take 7.5 mg by mouth as needed.        . methocarbamol (ROBAXIN) 500 MG tablet Take 500 mg by mouth daily. 2 DAILY       .  montelukast (SINGULAIR) 10 MG tablet Take 10 mg by mouth at bedtime.        . Multiple Vitamin (MULTIVITAMIN) tablet Take 1 tablet by mouth daily.        Marland Kitchen omeprazole (PRILOSEC) 20 MG capsule Take 20 mg by mouth daily.        . polyethylene glycol powder (GLYCOLAX/MIRALAX) powder Take 17 g by mouth daily.        . timolol (BETIMOL) 0.5 % ophthalmic solution 1 drop 2 (two) times daily.        Marland Kitchen tolterodine (DETROL LA) 4 MG 24 hr capsule Take 4 mg by mouth daily.        Marland Kitchen warfarin (COUMADIN) 5 MG tablet Take 5 mg by mouth as directed.          No Known Allergies  History   Social History  . Marital Status: Married    Spouse Name: N/A    Number of Children: 3  . Years of Education: 10   Occupational History  . [employer]    1980   Social History Main Topics  . Smoking status: Never Smoker   . Smokeless tobacco: Never Used  . Alcohol Use: No  . Drug Use: No  . Sexually Active:    Other Topics Concern  . 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 Stroke Mother   . Diabetes Mother   . Heart disease Mother   . Stroke Father   . Diabetes Father   . Cancer Father     PROSTATE  . Cancer Brother     RENAL AND BLADDER  . Lung disease Brother   . Lung disease Sister   . Hypertension Daughter   . Hypertension Son     ROS- All systems are reviewed and negative except as per the HPI above  Physical Exam: Filed Vitals:   02/06/11 1505 02/06/11 1506  BP: 156/90   Pulse: 83   Height: 5\' 2"  (1.575 m)   Weight: 170 lb (77.111 kg)   SpO2:  90%    GEN- The patient is ill appearing and dypsneic, alert and oriented x 3 today.   Head- normocephalic, atraumatic Eyes-  Sclera clear, conjunctiva pink Ears- hearing intact Oropharynx- clear Neck- supple, JVP 11cm Lymph- no cervical lymphadenopathy Lungs- bibasilar rales with increased work of breathing Heart- irregular rate and rhythm, no murmurs, rubs or gallops, PMI not laterally displaced GI- soft, NT, ND, + BS Extremities- no clubbing, cyanosis, +2 edema MS- no significant deformity or atrophy Skin- no rash or lesion Psych- euthymic mood, full affect Neuro- strength and sensation are intact  EKG-  Afib, V rate 83 bpm, septal infact, otherwise normal ekg  Holter monitor reveals afib, V rate 24-114 bpm with pauses of 3 and 4 seconds  Assessment and Plan:

## 2011-02-06 NOTE — Telephone Encounter (Signed)
Made an appointment w/ Dr. Johney Frame for today at 3:15.

## 2011-02-06 NOTE — Assessment & Plan Note (Signed)
The patient has very sympomatic persistent atrial fibrillation of new onset.  She has been initiated on coumadin.  Recent event monitor reveals tachy/brady with pauses of 4 seconds.  On exam, she is volume overloaded and quite dypsneic.   I will admit the patient for further evaluation. Given her tachy/brady, I agree with Dr [place] that she should have a pacemaker considered.  R/B/A to PPM were discussed at length with the patient and her spouse who wish to proceed. We will continue coumadin upon admission.  We will obtain an echo.  Once CHF is stable, we will proceed with PPM and also TEE guided cardioversion.  As she has had a normal cath previously, I would consider a Ic drug for rhythm control if her EF is preserved.

## 2011-02-06 NOTE — Assessment & Plan Note (Signed)
The patient is in CHF today on exam and significantly dypsneic.  We will admit to the cardiology service and initiate lasix. I will also obtain an echo to evaluate her EF.  We will proceed with TEE guided cardioversion once stable for treatment of CHF.

## 2011-02-07 DIAGNOSIS — I059 Rheumatic mitral valve disease, unspecified: Secondary | ICD-10-CM

## 2011-02-07 LAB — PRO B NATRIURETIC PEPTIDE: Pro B Natriuretic peptide (BNP): 2865 pg/mL — ABNORMAL HIGH (ref 0–450)

## 2011-02-08 LAB — BASIC METABOLIC PANEL
CO2: 32 mEq/L (ref 19–32)
Calcium: 9.6 mg/dL (ref 8.4–10.5)
GFR calc Af Amer: >60 mL/min (ref >60)
GFR calc non Af Amer: 54 mL/min — ABNORMAL LOW (ref >60)
Potassium: 3.8 mEq/L (ref 3.5–5.1)
Sodium: 135 mEq/L (ref 135–145)

## 2011-02-08 LAB — PROTIME-INR: Prothrombin Time: 36.1 seconds — ABNORMAL HIGH (ref 11.6–15.2)

## 2011-02-09 ENCOUNTER — Other Ambulatory Visit (HOSPITAL_COMMUNITY): Payer: Medicare Other | Admitting: Radiology

## 2011-02-09 LAB — BASIC METABOLIC PANEL
Calcium: 9.4 mg/dL (ref 8.4–10.5)
Creatinine, Ser: 0.81 mg/dL (ref 0.4–1.2)
GFR calc Af Amer: >60 mL/min (ref >60)

## 2011-02-09 LAB — PROTIME-INR: INR: 2.62 — ABNORMAL HIGH (ref 0.00–1.49)

## 2011-02-13 NOTE — Cardiovascular Report (Signed)
  [name], REPSHER                 ACCOUNT NO.:  [id number]  MEDICAL RECORD NO.:  [id number]           PATIENT TYPE:  I  LOCATION:  4704                         FACILITY:  [facility]  PHYSICIAN:  Marca Ancona, MD      DATE OF BIRTH:  11-22-1924  DATE OF PROCEDURE:  02/08/2011 DATE OF DISCHARGE:                           CARDIAC CATHETERIZATION   PROCEDURE:  Direct current cardioversion.  INDICATIONS:  Atrial fibrillation.  This is an 77 year old with atrial fibrillation.  She was actually supratherapeutic on Coumadin with an INR 3.6 today.  PROCEDURE NOTE:  After informed was obtained, the patient underwent TEE which showed no left atrial appendage thrombus.  She was then sedated by Anesthesiology using 120 mg of IV propofol.  Direct current cardioversion was initially carried out at 150 joules biphasic waveform. Initially, she did not convert to sinus rhythm.  She was shocked a second time with 200 joules biphasic waveform and did convert to sinus rhythm.  There were no complications.     Marca Ancona, MD     DM/MEDQ  D:  02/08/2011  T:  02/09/2011  Job:  [id number]  cc:   Peter M. Sw[place]iland, M.D.  Electronically Signed by Marca Ancona MD on 02/13/2011 08:35:46 AM

## 2011-02-22 NOTE — Discharge Summary (Signed)
[name], [name]                 ACCOUNT NO.:  [id number]  MEDICAL RECORD NO.:  [id number]           PATIENT TYPE:  I  LOCATION:  4704                         FACILITY:  [facility]  PHYSICIAN:  Rithvik Orcutt M. [place], M.D.  DATE OF BIRTH:  Apr 25, 1925  DATE OF ADMISSION:  02/06/2011 DATE OF DISCHARGE:  02/09/2011                              DISCHARGE SUMMARY   PROCEDURES: 1. Transesophageal echocardiogram. 2. 2-D echocardiogram. 3. Two-view chest x-ray. 4. Direct current cardioversion.  PRIMARY FINAL DISCHARGE DIAGNOSIS:  Paroxysmal atrial fibrillation, tachybrady syndrome with the heart rates in the 130s and pauses up to 4 seconds.  SECONDARY DIAGNOSES: 1. Over anticoagulation with Coumadin, INR 6.34 on admission and 2.62     at discharge. 2. Acute diastolic congestive heart failure. 3. History of renal artery stenosis status post right renal artery     angioplasty. 4. Hypertension. 5. History of pneumonia. 6. Gastroesophageal reflux disease. 7. Chronic back pain. 8. Hyponatremia. 9. History of seizures. 10.History of urinary incontinence. 11.Osteopenia. 12.History of syndrome of inappropriate antidiuretic hormone secretion     likely related to her lung process. 13.Status post cholecystectomy, back surgery x2, bilateral knee     arthroplasty, tonsillectomy, appendectomy, cardiac     catheterizations, cataract surgery, shoulder surgery, and partial     hysterectomy.  TIME AT DISCHARGE:  38 minutes.  HOSPITAL COURSE:  [name] is an 77 year old female with recently diagnosed atrial fibrillation and tachy-brady syndrome.  She had a Holter monitor and had pauses up to 4 seconds as well as RVR.  She was seen by Dr. Johney Frame and was also noted to be short of breath.  There was concern for heart failure.  Her INR was supratherapeutic.  She was admitted for further evaluation and treatment.  She received IV Lasix and was then converted to p.o. as her volume status improved.  Her  INR was 6.34 on admission.  She was having no bleeding and the INR was allowed to drift down.  At discharge, the pharmacy recommendation was that her Coumadin dose be decreased to 2.5 mg daily and be followed closely.  Her Is and Os were negative by greater than 6 L during the course of her hospital stay.  Her weight decreased by 5 kg during her hospital stay.  Her O2 saturation was still low at discharge and Dr. Swaziland agreed to order home oxygen if the patient qualifies.  A recheck on her O2 sat is pending.  He felt she was euvolemic with her weight at 71.4 kg.  Dr. Johney Frame and Dr. Swaziland evaluated [name] and felt that a TEE cardioversion was the best option for her.  A TEE was performed on Feb 08, 2011 and it showed an EF of 60- 65% with no wall motion abnormalities.  There was grade 3 plaque in the aortic arch.  There was no evidence of thrombus in the atrial cavity or appendage and no right to left atrial shunt.  She was cardioverted successfully with 150 joules and then 200 joules biphasic wave forms. There were no complications.  On Feb 09, 2011, her INR was therapeutic at  2.62.  She was feeling much better and was denying shortness of breath and dizziness.  Dr. Swaziland felt that she was euvolemic.  Dr. Swaziland evaluated [name] and considered her stable for discharge in improved condition, to be followed closely as an outpatient.  DISCHARGE INSTRUCTIONS:  Her activity level is to be increased gradually.  She is to record her daily weight.  She is encouraged to stick to a low-sodium heart-healthy diet.  She is to use over-the- counter steroid cream as needed on any marks left by the defibrillation pounds.  She is to follow up with Norma Fredrickson for Dr. Sw[place]iland on March 02, 2011 at 8:30 and with Dr. Clelia Croft as needed.  She is to get an INR check on Monday or Tuesday.  DISCHARGE MEDICATIONS: 1. Coumadin 5 mg 1/2 tablet daily or as directed. 2. Depakore 500 mg daily. 3. Timolol  ophthalmic solution b.i.d. 4. MiraLax daily p.r.n. 5. Robaxin 500 mg b.i.d. 6. Amlodipine/olmesartan 10/40 one tablet daily. 7. Detrol LA 4 mg daily. 8. Singulair 10 mg a day. 9. Multivitamin daily. 10.Hydrocodone 5 mg q.6 h. p.r.n. 11.Meloxicam 7.5 mg 1-2 tablets b.i.d. p.r.n. 12.Travatan eye drops q.p.m. 13.Omeprazole 20 mg daily. 14.Citracal Plus D 400/200 two tablets daily. 15.Albuterol HFA 2 puffs q.6 h. p.r.n. 16.Vitamin D3 1000 units OTC daily. 17.Zyrtec 10 mg daily. 18.Lasix 40 mg daily. 19.Lisinopril 40 mg b.i.d.     Theodore Demark, PA-C   ______________________________ Eulice Rutledge M. [place], M.D.    RB/MEDQ  D:  02/09/2011  T:  02/10/2011  Job:  [id number]  cc:   [name], M.D.  Electronically Signed by Theodore Demark PA-C on 02/15/2011 11:28:26 AM Electronically Signed by Deakin Lacek [place] M.D. on 02/22/2011 06:04:44 PM

## 2011-03-02 ENCOUNTER — Encounter: Payer: Self-pay | Admitting: Nurse Practitioner

## 2011-03-02 ENCOUNTER — Ambulatory Visit (INDEPENDENT_AMBULATORY_CARE_PROVIDER_SITE_OTHER): Payer: Medicare Other | Admitting: Nurse Practitioner

## 2011-03-02 DIAGNOSIS — I4891 Unspecified atrial fibrillation: Secondary | ICD-10-CM

## 2011-03-02 DIAGNOSIS — I10 Essential (primary) hypertension: Secondary | ICD-10-CM

## 2011-03-02 DIAGNOSIS — R0600 Dyspnea, unspecified: Secondary | ICD-10-CM

## 2011-03-02 DIAGNOSIS — I5030 Unspecified diastolic (congestive) heart failure: Secondary | ICD-10-CM

## 2011-03-02 DIAGNOSIS — I5033 Acute on chronic diastolic (congestive) heart failure: Secondary | ICD-10-CM

## 2011-03-02 DIAGNOSIS — R0989 Other specified symptoms and signs involving the circulatory and respiratory systems: Secondary | ICD-10-CM

## 2011-03-02 DIAGNOSIS — R0609 Other forms of dyspnea: Secondary | ICD-10-CM

## 2011-03-02 LAB — BASIC METABOLIC PANEL
BUN: 16 mg/dL (ref 6–23)
CO2: 30 mEq/L (ref 19–32)
Calcium: 9.6 mg/dL (ref 8.4–10.5)
Chloride: 92 mEq/L — ABNORMAL LOW (ref 96–112)
Creatinine, Ser: 0.7 mg/dL (ref 0.4–1.2)
GFR: 82.1 mL/min (ref >60.00)
Glucose, Bld: 79 mg/dL (ref 70–99)
Potassium: 4.6 mEq/L (ref 3.5–5.1)
Sodium: 129 mEq/L — ABNORMAL LOW (ref 135–145)

## 2011-03-02 LAB — BRAIN NATRIURETIC PEPTIDE: Pro B Natriuretic peptide (BNP): 274 pg/mL — ABNORMAL HIGH (ref 0.0–100.0)

## 2011-03-02 MED ORDER — WARFARIN SODIUM 5 MG PO TABS: 5.0000 mg | ORAL_TABLET | ORAL | Status: DC

## 2011-03-02 MED ORDER — FUROSEMIDE 40 MG PO TABS: 40.0000 mg | ORAL_TABLET | Freq: Every day | ORAL | Status: DC

## 2011-03-02 NOTE — Assessment & Plan Note (Signed)
She is back in sinus but is bradycardic. I have discussed her care with Dr. Swaziland. We will place an event monitor for the next 30 days. We would like to rule out recurrent atrial fib and also be looking for recurrent pauses. She may still need permanent pacemaker implant. Further disposition is to follow.

## 2011-03-02 NOTE — Patient Instructions (Signed)
We are going to put an event monitor on for the next one month. This will monitor your heart rhythm and let us know if you need a pacemaker We are going to check your labs today I would like for you to take the Lasix 40 mg every day Stay on your coumadin You will see Dr. Swaziland in one month.

## 2011-03-02 NOTE — Progress Notes (Signed)
[name] Date of Birth: 1925-04-05   History of Present Illness: [name] is seen back today for a post hospital visit. She is seen for Dr. Swaziland. She was admitted back in May with atrial fib and had documented tachy brady with her atrial fib. She was placed on coumadin. She had a holter which showed the tachy brady as well as 3 to 4 second pauses. She was referred to EP for a pacemaker. She was admitted from Dr. Jenel Lucks office with heart failure symptoms due to the atrial fib. She did not get a pacemaker placed during that admission. Her coumadin level was high. She did have a TEE CV and is now back in sinus rhythm. She still feels weak and tired. She is short of breath. She was given a prescription for Lasix but she did not get it filled. She does have pedal edema. She checks her blood pressure at home but does not remember what her pulse rate is running. She though she would be filling better than she currently is. Last INR earlier this week was 2.1. She has her coumadin checked by Dr. Clelia Croft.   Current Outpatient Prescriptions on File Prior to Visit  Medication Sig Dispense Refill  . aspirin 325 MG EC tablet Take 81 mg by mouth daily.       . Calcium Citrate (CITRACAL PO) Take by mouth daily.        . calcium-vitamin D (OSCAL WITH D) 500-200 MG-UNIT per tablet Take 1 tablet by mouth 2 (two) times daily.        . cetirizine (ZYRTEC) 10 MG tablet Take 10 mg by mouth daily.        . cholecalciferol (VITAMIN D) 1000 UNITS tablet Take 1,000 Units by mouth daily.        . divalproex (DEPAKOTE) 500 MG EC tablet Take 500 mg by mouth at bedtime.        Marland Kitchen lisinopril (PRINIVIL,ZESTRIL) 40 MG tablet Take 40 mg by mouth 2 (two) times daily.        . meloxicam (MOBIC) 7.5 MG tablet Take 7.5 mg by mouth as needed.        . methocarbamol (ROBAXIN) 500 MG tablet Take 500 mg by mouth daily. 2 DAILY       . montelukast (SINGULAIR) 10 MG tablet Take 10 mg by mouth at bedtime.        . Multiple Vitamin  (MULTIVITAMIN) tablet Take 1 tablet by mouth daily.        Marland Kitchen omeprazole (PRILOSEC) 20 MG capsule Take 20 mg by mouth daily.        . polyethylene glycol powder (GLYCOLAX/MIRALAX) powder Take 17 g by mouth daily.        . timolol (BETIMOL) 0.5 % ophthalmic solution 1 drop 2 (two) times daily.        Marland Kitchen tolterodine (DETROL LA) 4 MG 24 hr capsule Take 4 mg by mouth daily.        . travoprost, benzalkonium, (TRAVATAN) 0.004 % ophthalmic solution Place 1 drop into the left eye at bedtime.        Marland Kitchen DISCONTD: warfarin (COUMADIN) 5 MG tablet Take 5 mg by mouth as directed.        Marland Kitchen amLODipine-olmesartan (AZOR) 10-40 MG per tablet Take 1 tablet by mouth daily.        Marland Kitchen DISCONTD: aliskiren (TEKTURNA) 150 MG tablet Take 150 mg by mouth daily.  No Known Allergies  Past Medical History  Diagnosis Date  . Pneumonia 04/2010  . Hypertension   . Renal artery stenosis 2008    STATUS POST ANGIOPLASTY  . GERD (gastroesophageal reflux disease)   . Back pain     CHRONIC  . Atrial fibrillation     persistent  . Hyponatremia     improved  . Seizure disorder     on anti-epileptic therapy  . Urinary incontinence   . Osteopenia   . S/P partial hysterectomy   . SOB (shortness of breath)   . SIADH (syndrome of inappropriate ADH production)     h/o HYPONATREMIA ; LIKELY RELATED TO HER LUNG PROCESS    Past Surgical History  Procedure Date  . Cholecystectomy   . Back surgery     X2  . Total knee arthroplasty     RIGHT AND LEFT KNEE  . Tonsillectomy   . Appendectomy   . Cardiac catheterization 2007    Normal  . Knee arthroscopy     LEFT KNEE  . Cataract extraction   . Shoulder surgery   . Renal angioplasty 01/2007    RIGHT    History  Smoking status  . Never Smoker   Smokeless tobacco  . Never Used    History  Alcohol Use No    Family History  Problem Relation Age of Onset  . Stroke Mother   . Diabetes Mother   . Heart disease Mother   . Stroke Father   . Diabetes Father     . Cancer Father     PROSTATE  . Cancer Brother     RENAL AND BLADDER  . Lung disease Brother   . Lung disease Sister   . Hypertension Daughter   . Hypertension Son     Review of Systems: The review of systems is positive for fatigue and shortness of breath. She has some swelling in her feet but it is a little better. No syncope.  All other systems were reviewed and are negative.  Physical Exam: BP 138/70  Pulse 60  Wt 160 lb (72.576 kg) Patient is alert and in no acute distress. Skin is warm and dry. Color is normal.  HEENT is unremarkable. Normocephalic/atraumatic. PERRL. Sclera are nonicteric. Neck is supple. No masses. No JVD. Lungs show scattered crackles. Cardiac exam shows a regular rate and rhythm. She is bradycardic. Abdomen is soft. Extremities are with 1+ pedal edema. Gait and ROM are intact. No gross neurologic deficits noted.  LABORATORY DATA:   EKG today shows sinus bradycardia.   Assessment / Plan:

## 2011-03-02 NOTE — Assessment & Plan Note (Signed)
She has crackles and some edema on exam. She did not get the Lasix filled. We will check a BMET and BNP today. I have started the Lasix at 40 mg daily.

## 2011-03-02 NOTE — Assessment & Plan Note (Signed)
Blood pressure is ok. We will continue to monitor.

## 2011-03-03 NOTE — Progress Notes (Signed)
Patient called with lab results. Pt verbalized understanding. Jodette Temiloluwa Recchia RN  

## 2011-03-24 ENCOUNTER — Telehealth: Payer: Self-pay | Admitting: Cardiology

## 2011-03-24 NOTE — Telephone Encounter (Signed)
TRANSFERRED NOTE TO NURSE

## 2011-03-27 ENCOUNTER — Other Ambulatory Visit (INDEPENDENT_AMBULATORY_CARE_PROVIDER_SITE_OTHER): Payer: Medicare Other | Admitting: *Deleted

## 2011-03-27 DIAGNOSIS — R0989 Other specified symptoms and signs involving the circulatory and respiratory systems: Secondary | ICD-10-CM

## 2011-03-27 DIAGNOSIS — I4891 Unspecified atrial fibrillation: Secondary | ICD-10-CM

## 2011-03-27 DIAGNOSIS — R0600 Dyspnea, unspecified: Secondary | ICD-10-CM

## 2011-03-27 DIAGNOSIS — I5030 Unspecified diastolic (congestive) heart failure: Secondary | ICD-10-CM

## 2011-03-27 DIAGNOSIS — R0609 Other forms of dyspnea: Secondary | ICD-10-CM

## 2011-03-27 LAB — BASIC METABOLIC PANEL
BUN: 15 mg/dL (ref 6–23)
CO2: 29 mEq/L (ref 19–32)
Calcium: 9.5 mg/dL (ref 8.4–10.5)
Chloride: 94 mEq/L — ABNORMAL LOW (ref 96–112)
Creatinine, Ser: 1 mg/dL (ref 0.4–1.2)
GFR: 53.7 mL/min — ABNORMAL LOW (ref >60.00)
Glucose, Bld: 89 mg/dL (ref 70–99)
Potassium: 4.5 mEq/L (ref 3.5–5.1)
Sodium: 135 mEq/L (ref 135–145)

## 2011-03-28 NOTE — Progress Notes (Signed)
Patient called with lab results. Pt verbalized understanding. Jodette Namine Beahm RN  

## 2011-03-31 DIAGNOSIS — I495 Sick sinus syndrome: Secondary | ICD-10-CM

## 2011-04-04 ENCOUNTER — Telehealth: Payer: Self-pay | Admitting: *Deleted

## 2011-04-04 NOTE — Telephone Encounter (Signed)
Notified of event monitor results. Will see back end of July.

## 2011-04-05 ENCOUNTER — Other Ambulatory Visit: Payer: Self-pay | Admitting: Cardiology

## 2011-04-05 ENCOUNTER — Encounter: Payer: Self-pay | Admitting: Cardiology

## 2011-04-05 MED ORDER — FUROSEMIDE 40 MG PO TABS: 40.0000 mg | ORAL_TABLET | Freq: Every day | ORAL | Status: DC

## 2011-04-05 NOTE — Telephone Encounter (Signed)
PT NEEDS TO TALK TO NURSE ABOUT 2 MEDS. CHART IN BOX.

## 2011-04-05 NOTE — Telephone Encounter (Signed)
Called requesting refill on Lasix and Coumadin. Advised that we are not regulating his INR so Dr. Clelia Croft would have to prescribe that med. She wants to pick up Rx because she takes it to the [place]. Will have Rx ready for her to pick up.

## 2011-04-14 ENCOUNTER — Encounter: Payer: Self-pay | Admitting: Cardiology

## 2011-04-17 ENCOUNTER — Ambulatory Visit (INDEPENDENT_AMBULATORY_CARE_PROVIDER_SITE_OTHER): Payer: Medicare Other | Admitting: Cardiology

## 2011-04-17 ENCOUNTER — Encounter: Payer: Self-pay | Admitting: Cardiology

## 2011-04-17 DIAGNOSIS — I495 Sick sinus syndrome: Secondary | ICD-10-CM

## 2011-04-17 DIAGNOSIS — I10 Essential (primary) hypertension: Secondary | ICD-10-CM

## 2011-04-17 DIAGNOSIS — I4891 Unspecified atrial fibrillation: Secondary | ICD-10-CM

## 2011-04-17 NOTE — Assessment & Plan Note (Signed)
Blood pressure is well controlled on her current medications. 

## 2011-04-17 NOTE — Assessment & Plan Note (Signed)
No recurrence of atrial fibrillation based on her event monitor. Given this finding I recommended stopping her Coumadin since she has having difficulty with easy bruisability and recent falls. She will resume aspirin 81 mg daily. If she does have recurrent atrial flutter relation we will need to reconsider.

## 2011-04-17 NOTE — Patient Instructions (Addendum)
Try to minimize your use of Mobic.  Stop Coumadin now.  Resume ASA 81 mg daily.  I will see you again in 4 months.

## 2011-04-17 NOTE — Assessment & Plan Note (Signed)
No significant bradycardia or pauses to explain her ongoing symptoms of fatigue. I suspect this is multifactorial.

## 2011-04-17 NOTE — Progress Notes (Signed)
[name] Date of Birth: 05-09-1925   History of Present Illness: [name] is seen back today for followup. She has had no symptoms of recurrent palpitations or tachycardia. She still complains of fatigue. She has occasional lightheadedness if she changes position quickly. She does note occasional tightness across her chest this is not related to activity. She did have a normal cardiac catheterization in 2007. She does have some ankle edema which is worse in the evening. She does report that she has had 2 recent falls and does bruise easily.  Current Outpatient Prescriptions on File Prior to Visit  Medication Sig Dispense Refill  . amLODipine-olmesartan (AZOR) 10-40 MG per tablet Take 1 tablet by mouth daily.        . Calcium Citrate (CITRACAL PO) Take by mouth daily.       . calcium-vitamin D (OSCAL WITH D) 500-200 MG-UNIT per tablet Take 1 tablet by mouth 2 (two) times daily.        . cetirizine (ZYRTEC) 10 MG tablet Take 10 mg by mouth daily.        . cholecalciferol (VITAMIN D) 1000 UNITS tablet Take 1,000 Units by mouth daily.        . divalproex (DEPAKOTE) 500 MG EC tablet Take 500 mg by mouth at bedtime.        . furosemide (LASIX) 40 MG tablet Take 1 tablet (40 mg total) by mouth daily.  90 tablet  3  . lisinopril (PRINIVIL,ZESTRIL) 40 MG tablet Take 40 mg by mouth 2 (two) times daily.        . meloxicam (MOBIC) 7.5 MG tablet Take 7.5 mg by mouth as needed.        . methocarbamol (ROBAXIN) 500 MG tablet Take 500 mg by mouth daily. 2 DAILY       . montelukast (SINGULAIR) 10 MG tablet Take 10 mg by mouth at bedtime.        . Multiple Vitamin (MULTIVITAMIN) tablet Take 1 tablet by mouth daily.        Marland Kitchen omeprazole (PRILOSEC) 20 MG capsule Take 20 mg by mouth daily.        . polyethylene glycol powder (GLYCOLAX/MIRALAX) powder Take 17 g by mouth daily.        . timolol (BETIMOL) 0.5 % ophthalmic solution 1 drop 2 (two) times daily.        Marland Kitchen tolterodine (DETROL LA) 4 MG 24 hr capsule  Take 4 mg by mouth daily.        . travoprost, benzalkonium, (TRAVATAN) 0.004 % ophthalmic solution Place 1 drop into the left eye at bedtime.        Marland Kitchen warfarin (COUMADIN) 5 MG tablet Take 1 tablet (5 mg total) by mouth as directed.  30 tablet  3    No Known Allergies  Past Medical History  Diagnosis Date  . Pneumonia 04/2010  . Hypertension   . Renal artery stenosis 2008    STATUS POST ANGIOPLASTY  . GERD (gastroesophageal reflux disease)   . Back pain     CHRONIC  . Atrial fibrillation   . Hyponatremia     improved  . Seizure disorder     on anti-epileptic therapy  . Urinary incontinence   . Osteopenia   . S/P partial hysterectomy   . SOB (shortness of breath)   . SIADH (syndrome of inappropriate ADH production)     h/o HYPONATREMIA ; LIKELY RELATED TO HER LUNG PROCESS    Past Surgical History  Procedure Date  . Cholecystectomy   . Back surgery     X2  . Total knee arthroplasty     RIGHT AND LEFT KNEE  . Tonsillectomy   . Appendectomy   . Cardiac catheterization 2007    Normal  . Knee arthroscopy     LEFT KNEE  . Cataract extraction   . Shoulder surgery   . Renal angioplasty 01/2007    RIGHT    History  Smoking status  . Never Smoker   Smokeless tobacco  . Never Used    History  Alcohol Use No    Family History  Problem Relation Age of Onset  . Stroke Mother   . Diabetes Mother   . Heart disease Mother   . Stroke Father   . Diabetes Father   . Cancer Father     PROSTATE  . Cancer Brother     RENAL AND BLADDER  . Lung disease Brother   . Lung disease Sister   . Hypertension Daughter   . Hypertension Son     Review of Systems: The review of systems is positive for fatigue. She has some swelling in her feet. No syncope.  All other systems were reviewed and are negative.  Physical Exam: BP 120/80  Pulse 64  Ht 5\' 2"  (1.575 m)  Wt 155 lb 3.2 oz (70.398 kg)  BMI 28.39 kg/m2 Patient is alert and in no acute distress. Skin is warm and dry.  Color is normal.  HEENT is unremarkable. Normocephalic/atraumatic. PERRL. Sclera are nonicteric. Neck is supple. No masses. No JVD. Lungs show scattered crackles. Cardiac exam shows a regular rate and rhythm. She is bradycardic. Abdomen is soft. Extremities are with 1+ pedal edema. Gait and ROM are intact. No gross neurologic deficits noted.  LABORATORY DATA:    Event monitor was reviewed and showed no episodes of recurrent atrial fibrillation and no significant bradycardia.  Assessment / Plan:

## 2011-05-24 ENCOUNTER — Encounter: Payer: Self-pay | Admitting: Vascular Surgery

## 2011-05-26 ENCOUNTER — Other Ambulatory Visit: Payer: Self-pay | Admitting: Internal Medicine

## 2011-05-26 DIAGNOSIS — Z1231 Encounter for screening mammogram for malignant neoplasm of breast: Secondary | ICD-10-CM

## 2011-06-08 ENCOUNTER — Ambulatory Visit
Admission: RE | Admit: 2011-06-08 | Discharge: 2011-06-08 | Disposition: A | Discharge status: Home / Self Care | Payer: Medicare Other | Source: Ambulatory Visit | Attending: Internal Medicine | Admitting: Internal Medicine

## 2011-06-08 DIAGNOSIS — Z1231 Encounter for screening mammogram for malignant neoplasm of breast: Secondary | ICD-10-CM

## 2011-06-19 HISTORY — PX: PACEMAKER INSERTION: SHX728

## 2011-06-28 ENCOUNTER — Ambulatory Visit (INDEPENDENT_AMBULATORY_CARE_PROVIDER_SITE_OTHER): Payer: Medicare Other | Admitting: Nurse Practitioner

## 2011-06-28 ENCOUNTER — Telehealth: Payer: Self-pay | Admitting: Cardiology

## 2011-06-28 ENCOUNTER — Encounter: Payer: Self-pay | Admitting: Nurse Practitioner

## 2011-06-28 VITALS — BP 134/87 | HR 72 | Ht 62.0 in | Wt 162.0 lb

## 2011-06-28 DIAGNOSIS — R0609 Other forms of dyspnea: Secondary | ICD-10-CM

## 2011-06-28 DIAGNOSIS — Z7901 Long term (current) use of anticoagulants: Secondary | ICD-10-CM

## 2011-06-28 DIAGNOSIS — I4891 Unspecified atrial fibrillation: Secondary | ICD-10-CM

## 2011-06-28 DIAGNOSIS — R0989 Other specified symptoms and signs involving the circulatory and respiratory systems: Secondary | ICD-10-CM

## 2011-06-28 DIAGNOSIS — R0600 Dyspnea, unspecified: Secondary | ICD-10-CM

## 2011-06-28 MED ORDER — DABIGATRAN ETEXILATE MESYLATE 75 MG PO CAPS: 75.0000 mg | ORAL_CAPSULE | Freq: Two times a day (BID) | ORAL | Status: DC

## 2011-06-28 NOTE — Telephone Encounter (Signed)
Sob and irregular heartbeat for two days, pt wants to be seen today, pls call [phone]

## 2011-06-28 NOTE — Telephone Encounter (Signed)
Pt got an appt with Dr. Johney Frame on Oct 31.  Is this ok for her to wait?  Please call her back.  [phone]

## 2011-06-28 NOTE — Patient Instructions (Addendum)
We need to get you back on some blood thinner. We are going to put you on Pradaxa 75 mg two times a day You have to stop the Mobic and Aspirin  I am going to get you an appointment to see Dr. Johney Frame. We are going to need to proceed with a pacemaker.  We are going to check some labs today as well.

## 2011-06-28 NOTE — Assessment & Plan Note (Signed)
She is back in atrial fib. She needs to be back on anticoagulation. Dr. Sw[place]iland and I have discussed this as well. She is unsteady and has fallen. While anticoagulation will be risky, she is at higher risk of stroke. We will start Pradaxa 75 mg BID. She is to stop her aspirin and Mobic. We will proceed on with PTVP with antiarrhythmic therapy. It is our hope that she will not need long term anticoagulation. She and her husband are informed of the bleeding risk that we are taking with Pradaxa. She is to report any and all falls. Patient and her husband are agreeable to this plan and will call if any problems develop in the interim.

## 2011-06-28 NOTE — Progress Notes (Signed)
[name] Date of Birth: [birth year]   History of Present Illness: [name] is seen back in the office today for a work in visit. She is seen for Dr. Sw[place]il[place]. She is not feeling well. She has felt bad for the last one to two weeks. She is more fatigued and more short of breath. She thinks she may be back out of rhythm. She has had prior atrial fib. She has had documented tachy brady with pauses. She was cardioverted less than 4 months ago. A repeat monitor in July showed no significant bradycardia per Dr. Elvis Coil last note.  She is very unsteady. She has fallen. Last time was about a month ago. No significant injury sustained. No passing out. No chest pain.   Current Outpatient Prescriptions on File Prior to Visit  Medication Sig Dispense Refill  . amLODipine-olmesartan (AZOR) 10-40 MG per tablet Take 1 tablet by mouth daily.        Marland Kitchen aspirin 325 MG EC tablet Take 325 mg by mouth daily.        . Calcium Citrate (CITRACAL PO) Take by mouth daily.       . calcium-vitamin D (OSCAL WITH D) 500-200 MG-UNIT per tablet Take 1 tablet by mouth 2 (two) times daily.        . cholecalciferol (VITAMIN D) 1000 UNITS tablet Take 1,000 Units by mouth daily.        . divalproex (DEPAKOTE) 500 MG EC tablet Take 500 mg by mouth at bedtime.        . furosemide (LASIX) 40 MG tablet Take 1 tablet (40 mg total) by mouth daily.  90 tablet  3  . lisinopril (PRINIVIL,ZESTRIL) 40 MG tablet Take 40 mg by mouth 2 (two) times daily.        . methocarbamol (ROBAXIN) 500 MG tablet Take 500 mg by mouth daily. 2 DAILY       . montelukast (SINGULAIR) 10 MG tablet Take 10 mg by mouth at bedtime.        . Multiple Vitamin (MULTIVITAMIN) tablet Take 1 tablet by mouth daily.        Marland Kitchen omeprazole (PRILOSEC) 20 MG capsule Take 20 mg by mouth daily.        . polyethylene glycol powder (GLYCOLAX/MIRALAX) powder Take 17 g by mouth daily.        . timolol (BETIMOL) 0.5 % ophthalmic solution 1 drop 2 (two) times daily.        .  travoprost, benzalkonium, (TRAVATAN) 0.004 % ophthalmic solution Place 1 drop into the left eye at bedtime.          No Known Allergies  Past Medical History  Diagnosis Date  . Pneumonia 04/2010  . Hypertension   . Renal artery stenosis 2008    STATUS POST ANGIOPLASTY  . GERD (gastroesophageal reflux disease)   . Back pain     CHRONIC  . Atrial fibrillation     Prior TEE/CV in May 2012  . Hyponatremia     improved  . Seizure disorder     on anti-epileptic therapy  . Urinary incontinence   . Osteopenia   . S/P partial hysterectomy   . SOB (shortness of breath)   . SIADH (syndrome of inappropriate ADH production)     h/o HYPONATREMIA ; LIKELY RELATED TO HER LUNG PROCESS  . Arthritis   . Hiatal hernia   . Dizziness   . Joint pain   . Hiatal hernia   . Tachy-brady syndrome  with pauses noted in May 2012    Past Surgical History  Procedure Date  . Cholecystectomy   . Back surgery     X2  . Total knee arthroplasty     RIGHT AND LEFT KNEE  . Tonsillectomy   . Appendectomy   . Cardiac catheterization 2007    Normal  . Knee arthroscopy     LEFT KNEE  . Cataract extraction   . Shoulder surgery   . Renal angioplasty 01/2007    RIGHT  . Tee with cv 01/2011    History  Smoking status  . Never Smoker   Smokeless tobacco  . Never Used    History  Alcohol Use No    Family History  Problem Relation Age of Onset  . Stroke Mother   . Diabetes Mother   . Heart disease Mother   . Stroke Father   . Diabetes Father   . Cancer Father     PROSTATE  . Cancer Brother     RENAL AND BLADDER  . Lung disease Brother   . Lung disease Sister   . Hypertension Daughter   . Hypertension Son     Review of Systems: The review of systems is positive for unsteady gait and falls. No chest pain. She is short of breath. She is taking her Lasix now every day. No real swelling.  All other systems were reviewed and are negative.  Physical Exam: BP 134/87  Pulse 72  Ht 5\' 2"   (1.575 m)  Wt 162 lb (73.483 kg)  BMI 29.63 kg/m2  SpO2 93% Patient is very pleasant and in no acute distress. She does look fatigued and like she doesn't feel well. She is unsteady. Skin is warm and dry. Color is normal.  HEENT is unremarkable. Normocephalic/atraumatic. PERRL. Sclera are nonicteric. Neck is supple. No masses. No JVD. Lungs are clear. Cardiac exam shows an irregular rate and rhythm. Rate is slow. Abdomen is soft. Extremities are without edema. Gait and ROM are intact. No gross neurologic deficits noted.   LABORATORY DATA: EKG shows atrial fib with a slow ventricular response. Tracing is reviewed with Dr. Swaziland.    Assessment / Plan:

## 2011-06-28 NOTE — Telephone Encounter (Signed)
Called stating she has been very SOB past 2 days; went to see another doctor this morning and was told her HR was very irregular. Will add on to see Sunday Spillers today.

## 2011-06-28 NOTE — Assessment & Plan Note (Signed)
She is back in atrial fib. This is probably the reason she does not feel good. I have discussed her care with Dr. Swaziland today in the office. It is felt that she will need some type of antiarrhythmic therapy to hold her in sinus rhythm. Her rate is already slow. She will need PTVP placement. We will refer back to Dr. Johney Frame. Repeat labs are checked today as well.

## 2011-06-29 LAB — BASIC METABOLIC PANEL
BUN: 11 mg/dL (ref 6–23)
CO2: 27 mEq/L (ref 19–32)
Calcium: 9.4 mg/dL (ref 8.4–10.5)
Chloride: 94 mEq/L — ABNORMAL LOW (ref 96–112)
Creatinine, Ser: 0.7 mg/dL (ref 0.4–1.2)
GFR: 86.16 mL/min (ref >60.00)
Glucose, Bld: 86 mg/dL (ref 70–99)
Potassium: 4.6 mEq/L (ref 3.5–5.1)
Sodium: 130 mEq/L — ABNORMAL LOW (ref 135–145)

## 2011-06-29 LAB — TSH: TSH: 1.97 u[IU]/mL (ref 0.35–5.50)

## 2011-06-29 LAB — CBC WITH DIFFERENTIAL/PLATELET
Basophils Absolute: 0 10*3/uL (ref 0.0–0.1)
Basophils Relative: 0.3 % (ref 0.0–3.0)
Eosinophils Absolute: 0.2 10*3/uL (ref 0.0–0.7)
Eosinophils Relative: 3 % (ref 0.0–5.0)
HCT: 38.6 % (ref 36.0–46.0)
Hemoglobin: 12.9 g/dL (ref 12.0–15.0)
Lymphocytes Relative: 18.9 % (ref 12.0–46.0)
Lymphs Abs: 1.1 10*3/uL (ref 0.7–4.0)
MCHC: 33.3 g/dL (ref 30.0–36.0)
MCV: 92.3 fl (ref 78.0–100.0)
Monocytes Absolute: 0.7 10*3/uL (ref 0.1–1.0)
Monocytes Relative: 11.5 % (ref 3.0–12.0)
Neutro Abs: 3.9 10*3/uL (ref 1.4–7.7)
Neutrophils Relative %: 66.3 % (ref 43.0–77.0)
Platelets: 280 10*3/uL (ref 150.0–400.0)
RBC: 4.19 Mil/uL (ref 3.87–5.11)
RDW: 14.5 % (ref 11.5–14.6)
WBC: 5.9 10*3/uL (ref 4.5–10.5)

## 2011-06-29 LAB — BRAIN NATRIURETIC PEPTIDE: Pro B Natriuretic peptide (BNP): 483 pg/mL — ABNORMAL HIGH (ref 0.0–100.0)

## 2011-06-29 NOTE — Telephone Encounter (Signed)
Called wanting a sooner app. Checked w/Kelly-Dr.Allred's nurse and will change app to 10/15 @ 9:00.

## 2011-07-03 ENCOUNTER — Ambulatory Visit (INDEPENDENT_AMBULATORY_CARE_PROVIDER_SITE_OTHER): Payer: Medicare Other | Admitting: Internal Medicine

## 2011-07-03 ENCOUNTER — Encounter: Payer: Self-pay | Admitting: Vascular Surgery

## 2011-07-03 ENCOUNTER — Encounter: Payer: Self-pay | Admitting: *Deleted

## 2011-07-03 ENCOUNTER — Encounter: Payer: Self-pay | Admitting: Internal Medicine

## 2011-07-03 VITALS — BP 121/82 | HR 70 | Ht 62.0 in | Wt 163.0 lb

## 2011-07-03 DIAGNOSIS — I495 Sick sinus syndrome: Secondary | ICD-10-CM

## 2011-07-03 DIAGNOSIS — I10 Essential (primary) hypertension: Secondary | ICD-10-CM

## 2011-07-03 DIAGNOSIS — I4891 Unspecified atrial fibrillation: Secondary | ICD-10-CM

## 2011-07-03 DIAGNOSIS — I5033 Acute on chronic diastolic (congestive) heart failure: Secondary | ICD-10-CM

## 2011-07-03 LAB — BASIC METABOLIC PANEL
CO2: 28 mEq/L (ref 19–32)
Glucose, Bld: 86 mg/dL (ref 70–99)
Potassium: 5.2 mEq/L — ABNORMAL HIGH (ref 3.5–5.1)
Sodium: 133 mEq/L — ABNORMAL LOW (ref 135–145)

## 2011-07-03 LAB — CBC WITH DIFFERENTIAL/PLATELET
Basophils Relative: 0.3 % (ref 0.0–3.0)
Eosinophils Relative: 1.7 % (ref 0.0–5.0)
HCT: 38.5 % (ref 36.0–46.0)
Lymphs Abs: 1 10*3/uL (ref 0.7–4.0)
Monocytes Relative: 11.9 % (ref 3.0–12.0)
Neutrophils Relative %: 72.4 % (ref 43.0–77.0)
Platelets: 275 10*3/uL (ref 150.0–400.0)
RBC: 4.13 Mil/uL (ref 3.87–5.11)
WBC: 7 10*3/uL (ref 4.5–10.5)

## 2011-07-03 NOTE — Patient Instructions (Signed)
Your physician has recommended that you have a pacemaker inserted. A pacemaker is a small device that is placed under the skin of your chest or abdomen to help control abnormal heart rhythms. This device uses electrical pulses to prompt the heart to beat at a normal rate. Pacemakers are used to treat heart rhythms that are too slow. Wire (leads) are attached to the pacemaker that goes into the chambers of you heart. This is done in the hospital and usually requires and overnight stay. Please see the instruction sheet given to you today for more information.  Your physician recommends that you return for lab work in: today.

## 2011-07-03 NOTE — Progress Notes (Signed)
[name] is a pleasant 77 y.o. WF patient with recently diagnosed atrial fibrillation and tachycardia/ bradycardia syndrome who presents today for further EP consultation.  The patient was diagnosed with atrial fibrillation 5/12 after presenting to her PCP with dizziness and SOC.  She was evaluated by Norma Fredrickson.  She was initiated on coumadin and had an event monitor placed.  On the event monitor, she was found to have tachy/brady syndrome with heart rates up to 114 bpm as well as pauses of 4 seconds.  She developed progressive CHF and upon being evaluated by me was noted to be quite hypoxic.  She was admitted to [facility] and underwent cardioversion.  She did well initially. Unfortunately, she has developed recurrent symptomatic afib.  She is felt to possibly require PPM prior to initiation of AAD therapy.  She denies symptoms of chest pain, presyncope, syncope, or neurologic sequela. The patient is tolerating medications without difficulties and is otherwise without complaint today.   Past Medical History  Diagnosis Date  . Pneumonia 04/2010  . Hypertension   . Renal artery stenosis 2008    STATUS POST ANGIOPLASTY  . GERD (gastroesophageal reflux disease)   . Back pain     CHRONIC  . Atrial fibrillation     Prior TEE/CV in May 2012  . Hyponatremia     improved  . Seizure disorder     on anti-epileptic therapy  . Urinary incontinence   . Osteopenia   . S/P partial hysterectomy   . SOB (shortness of breath)   . SIADH (syndrome of inappropriate ADH production)     h/o HYPONATREMIA ; LIKELY RELATED TO HER LUNG PROCESS  . Arthritis   . Hiatal hernia   . Dizziness   . Joint pain   . Hiatal hernia   . Tachy-brady syndrome     with pauses noted in May 2012   Past Surgical History  Procedure Date  . Cholecystectomy   . Back surgery     X2  . Total knee arthroplasty     RIGHT AND LEFT KNEE  . Tonsillectomy   . Appendectomy   . Cardiac catheterization 2007    Normal  . Knee  arthroscopy     LEFT KNEE  . Cataract extraction   . Shoulder surgery   . Renal angioplasty 01/2007    RIGHT  . Tee with cv 01/2011    Current Outpatient Prescriptions  Medication Sig Dispense Refill  . amLODipine-olmesartan (AZOR) 10-40 MG per tablet Take 1 tablet by mouth daily.        . Calcium Citrate (CITRACAL PO) Take by mouth daily.       . calcium-vitamin D (OSCAL WITH D) 500-200 MG-UNIT per tablet Take 1 tablet by mouth 2 (two) times daily.        . cetirizine (ZYRTEC) 10 MG tablet Take 10 mg by mouth daily.        . cholecalciferol (VITAMIN D) 1000 UNITS tablet Take 1,000 Units by mouth daily.        . dabigatran (PRADAXA) 75 MG CAPS Take 1 capsule (75 mg total) by mouth every 12 (twelve) hours.  60 capsule  3  . divalproex (DEPAKOTE) 500 MG EC tablet Take 500 mg by mouth at bedtime.        . furosemide (LASIX) 40 MG tablet Take 1 tablet (40 mg total) by mouth daily.  90 tablet  3  . HYDROcodone-acetaminophen (VICODIN) 5-500 MG per tablet Take 1 tablet by mouth  every 6 (six) hours as needed.        Marland Kitchen lisinopril (PRINIVIL,ZESTRIL) 40 MG tablet Take 40 mg by mouth 2 (two) times daily.        . methocarbamol (ROBAXIN) 500 MG tablet Take 500 mg by mouth daily. 2 DAILY       . montelukast (SINGULAIR) 10 MG tablet Take 10 mg by mouth at bedtime.        . Multiple Vitamin (MULTIVITAMIN) tablet Take 1 tablet by mouth daily.        Marland Kitchen omeprazole (PRILOSEC) 20 MG capsule Take 20 mg by mouth daily.        . polyethylene glycol powder (GLYCOLAX/MIRALAX) powder Take 17 g by mouth daily.        . timolol (BETIMOL) 0.5 % ophthalmic solution 1 drop 2 (two) times daily.        . travoprost, benzalkonium, (TRAVATAN) 0.004 % ophthalmic solution Place 1 drop into the left eye at bedtime.          No Known Allergies  History   Social History  . Marital Status: Married    Spouse Name: N/A    Number of Children: 3  . Years of Education: 10   Occupational History  . [employer]      1980   Social History Main Topics  . Smoking status: Never Smoker   . Smokeless tobacco: Never Used  . Alcohol Use: No  . Drug Use: No  . Sexually Active:    Other Topics Concern  . 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 Stroke Mother   . Diabetes Mother   . Heart disease Mother   . Stroke Father   . Diabetes Father   . Cancer Father     PROSTATE  . Cancer Brother     RENAL AND BLADDER  . Lung disease Brother   . Lung disease Sister   . Hypertension Daughter   . Hypertension Son     ROS- All systems are reviewed and negative except as per the HPI above  Physical Exam: Filed Vitals:   07/03/11 0840  BP: 121/82  Pulse: 70  Height: 5\' 2"  (1.575 m)  Weight: 163 lb (73.936 kg)    GEN- The patient is elderly appearing, alert and oriented x 3 today.   Head- normocephalic, atraumatic Eyes-  Sclera clear, conjunctiva pink Ears- hearing intact Oropharynx- clear Neck- supple,   Lymph- no cervical lymphadenopathy Lungs- CTAB Heart- irregular rate and rhythm, no murmurs, rubs or gallops, PMI not laterally displaced GI- soft, NT, ND, + BS Extremities- no clubbing, cyanosis, +1 edema MS- no significant deformity or atrophy Skin- no rash or lesion Psych- euthymic mood, full affect Neuro- strength and sensation are intact  Holter monitor 5/12 reveals afib, V rate 24-114 bpm with pauses of 3 and 4 seconds  Assessment and Plan:

## 2011-07-03 NOTE — Assessment & Plan Note (Signed)
She has returned to afib.  She is very symptomatic.  Once she has pacemaker backup, I would recommend initiation of amiodarone.  Her CrCl is >30 and she is only on pradaxa 75mg  bid.  This is therefore a subtherapeutic dose.  I will hold pradaxa until after her pacemaker pocket is healed adequately.  At that point, we will initiate pradaxa 150mg  BID.  Once she has been adequatley anticoagulated, she could then undergo cardioversion.  I have spoken with Dr Swaziland who is in agreement with this plan.

## 2011-07-03 NOTE — Assessment & Plan Note (Signed)
Stable No change required today  

## 2011-07-03 NOTE — Assessment & Plan Note (Signed)
[name] presents today for further EP consultation regarding her tachycardia/ bradycardia syndrome.  She has returned to afib and is very symptomatic.  She has previously had significant pauses during afib of > 4 seconds.  I spoke with Dr Swaziland at length about [name] this am.  He does not feel that he can adequately treat her afib without pacemaker back up.  She is quite tachycardic today.  I would agree with him that she will likely require significant AV nodal blockade in addition to antiarrhythmic drug therapy. The patient has had symptomatic bradycardia.  I would therefore recommend pacemaker implantation at this time.  Risks, benefits, alternatives to pacemaker implantation were discussed in detail with the patient today. The patient understands that the risks include but are not limited to bleeding, infection, pneumothorax, perforation, tamponade, vascular damage, renal failure, MI, stroke, death,  and lead dislodgement and wishes to proceed. We will therefore schedule the procedure for tomorrow.  We will hold pradaxa in the interim.

## 2011-07-03 NOTE — Assessment & Plan Note (Signed)
She is again symptomatic.  With rate control and rhythm control, hopefully these symptoms with improve.

## 2011-07-04 ENCOUNTER — Ambulatory Visit (HOSPITAL_COMMUNITY)
Admission: RE | Admit: 2011-07-04 | Discharge: 2011-07-05 | Disposition: A | Discharge status: Home / Self Care | Payer: Medicare Other | Source: Ambulatory Visit | Attending: Internal Medicine | Admitting: Internal Medicine

## 2011-07-04 ENCOUNTER — Ambulatory Visit (INDEPENDENT_AMBULATORY_CARE_PROVIDER_SITE_OTHER): Payer: Medicare Other | Admitting: Vascular Surgery

## 2011-07-04 ENCOUNTER — Encounter: Payer: Self-pay | Admitting: Vascular Surgery

## 2011-07-04 ENCOUNTER — Ambulatory Visit (HOSPITAL_COMMUNITY): Payer: Medicare Other

## 2011-07-04 ENCOUNTER — Ambulatory Visit (INDEPENDENT_AMBULATORY_CARE_PROVIDER_SITE_OTHER): Payer: Medicare Other | Admitting: *Deleted

## 2011-07-04 ENCOUNTER — Telehealth: Payer: Self-pay | Admitting: *Deleted

## 2011-07-04 VITALS — BP 143/79 | HR 81 | Ht 62.0 in | Wt 162.0 lb

## 2011-07-04 DIAGNOSIS — I4891 Unspecified atrial fibrillation: Secondary | ICD-10-CM | POA: Insufficient documentation

## 2011-07-04 DIAGNOSIS — I701 Atherosclerosis of renal artery: Secondary | ICD-10-CM

## 2011-07-04 DIAGNOSIS — Z7901 Long term (current) use of anticoagulants: Secondary | ICD-10-CM | POA: Insufficient documentation

## 2011-07-04 DIAGNOSIS — I495 Sick sinus syndrome: Secondary | ICD-10-CM | POA: Insufficient documentation

## 2011-07-04 DIAGNOSIS — K219 Gastro-esophageal reflux disease without esophagitis: Secondary | ICD-10-CM | POA: Insufficient documentation

## 2011-07-04 DIAGNOSIS — I10 Essential (primary) hypertension: Secondary | ICD-10-CM | POA: Insufficient documentation

## 2011-07-04 NOTE — Telephone Encounter (Signed)
Patient having pacemaker placed today. Gave results of lab work to husband.

## 2011-07-04 NOTE — Telephone Encounter (Signed)
Message copied by Lorayne Bender on Tue Jul 04, 2011 11:15 AM ------      Message from: Rosalio Macadamia      Created: Fri Jun 30, 2011  7:46 AM       Please report. BNP is up. She is now taking her Lasix every day. Sodium is a little low but has had prior history. Try to restrict free water. Recheck on return visit.

## 2011-07-04 NOTE — Progress Notes (Signed)
The patient presents today for followup of her right renal artery angioplasty in 2008. She remains well-controlled hypertension. He does have a comfortable ablation and is scheduled for pacemaker placement this afternoon. He has no history of renal insufficiency.  Past Medical History  Diagnosis Date  . Pneumonia 04/2010  . Hypertension   . Renal artery stenosis 2008    STATUS POST ANGIOPLASTY  . GERD (gastroesophageal reflux disease)   . Back pain     CHRONIC  . Atrial fibrillation     Prior TEE/CV in May 2012  . Hyponatremia     improved  . Seizure disorder     on anti-epileptic therapy  . Urinary incontinence   . Osteopenia   . S/P partial hysterectomy   . SOB (shortness of breath)   . SIADH (syndrome of inappropriate ADH production)     h/o HYPONATREMIA ; LIKELY RELATED TO HER LUNG PROCESS  . Arthritis   . Hiatal hernia   . Dizziness   . Joint pain   . Hiatal hernia   . Tachy-brady syndrome     with pauses noted in May 2012    History  Substance Use Topics  . Smoking status: Never Smoker   . Smokeless tobacco: Never Used  . Alcohol Use: No    Family History  Problem Relation Age of Onset  . Stroke Mother   . Diabetes Mother   . Heart disease Mother   . Stroke Father   . Diabetes Father   . Cancer Father     PROSTATE  . Cancer Brother     RENAL AND BLADDER  . Lung disease Brother   . Lung disease Sister   . Hypertension Daughter   . Hypertension Son     No Known Allergies  No current outpatient prescriptions on file.  BP 143/79  Pulse 81  Ht 5\' 2"  (1.575 m)  Wt 162 lb (73.483 kg)  BMI 29.63 kg/m2  SpO2 97%  Body mass index is 29.63 kg/(m^2).       Review of systems positive for shortness of breath with exertion fibrillation otherwise negative  Physical exam: Well-developed well-nourished white female in no acute distress. HEENT normal. Carotid  arteries without bruits bilaterally heart irregular rhythm. No murmurs. Abdomen soft without  bruits. Musculoskeletal no major deformities. Neurologic grossly intact.  Renal duplex [place] no evidence of renal artery stenosis bilaterally normal renal size bilaterally  Impression and plan: Stable status post right renal artery angioplasty in 2008. Continued yearly duplex followup

## 2011-07-05 ENCOUNTER — Ambulatory Visit (HOSPITAL_COMMUNITY): Payer: Medicare Other

## 2011-07-05 LAB — BASIC METABOLIC PANEL
BUN: 12 mg/dL (ref 6–23)
CO2: 26 mEq/L (ref 19–32)
Chloride: 95 mEq/L — ABNORMAL LOW (ref 96–112)
Creatinine, Ser: 0.8 mg/dL (ref 0.50–1.10)

## 2011-07-12 ENCOUNTER — Telehealth: Payer: Self-pay | Admitting: Cardiology

## 2011-07-12 NOTE — Telephone Encounter (Signed)
Pt has pacer placed last week and pt is having SOB

## 2011-07-12 NOTE — Telephone Encounter (Signed)
Spoke with patient and she stated she started to have shortness of breath a few days ago and has been getting worse.  Does have some increased swelling in feet.  Discussed with Synetta Fail who discussed with Dr Swaziland.  Will have patient take Lasix 40 mg twice daily for 3 days.  Did advise to go to emergency department if worse of if during office hours she could call back here.  Also advised if no better to call back Friday morning.

## 2011-07-13 NOTE — Procedures (Unsigned)
RENAL ARTERY DUPLEX EVALUATION  INDICATION:  Right renal artery angioplasty in 2003.  HISTORY: Diabetes:  No. Cardiac:  No. Hypertension:  Yes. Smoking:  No.  RENAL ARTERY DUPLEX FINDINGS: Aorta-Proximal:  61 cm/s Aorta-Mid:  50 cm/s Aorta-Distal:  60 cm/s Celiac Artery Origin:  88 cm/s SMA Origin:  99 cm/s                                   RIGHT               LEFT Renal Artery Origin:             123 cm/s            81 cm/s Renal Artery Proximal:           142 cm/s            89 cm/s Renal Artery Mid:                NV                  115 cm/s Renal Artery Distal:             148 cm/s            69 cm/s Hilar Acceleration Time (AT): Renal-Aortic Ratio (RAR):        2.4                 1.9 Kidney Size:                     10.4 cm             9.6 cm End Diastolic Ratio (EDR): Resistive Index (RI):            0.71                0.63/0.74  IMPRESSION: 1. Technically difficult study due to atrial fibrillation; pulsatile     venous flow. 2. No evidence of significant renal artery stenosis bilaterally. 3. Bilateral kidney size is within normal limits. 4. Bilateral resistive indices and renal aortic ratios are within     normal limits.  ___________________________________________ [name], M.D.  LT/MEDQ  D:  07/04/2011  T:  07/04/2011  Job:  [id number]

## 2011-07-14 ENCOUNTER — Emergency Department (HOSPITAL_COMMUNITY): Payer: Medicare Other

## 2011-07-14 ENCOUNTER — Inpatient Hospital Stay (HOSPITAL_COMMUNITY)
Admission: EM | Admit: 2011-07-14 | Discharge: 2011-07-24 | DRG: 291 | Disposition: A | Discharge status: Home / Self Care | Payer: Medicare Other | Attending: Cardiovascular Disease | Admitting: Cardiovascular Disease

## 2011-07-14 DIAGNOSIS — D72829 Elevated white blood cell count, unspecified: Secondary | ICD-10-CM | POA: Diagnosis present

## 2011-07-14 DIAGNOSIS — I5033 Acute on chronic diastolic (congestive) heart failure: Secondary | ICD-10-CM

## 2011-07-14 DIAGNOSIS — I4891 Unspecified atrial fibrillation: Secondary | ICD-10-CM | POA: Diagnosis present

## 2011-07-14 DIAGNOSIS — J90 Pleural effusion, not elsewhere classified: Secondary | ICD-10-CM | POA: Diagnosis present

## 2011-07-14 DIAGNOSIS — Z7901 Long term (current) use of anticoagulants: Secondary | ICD-10-CM

## 2011-07-14 DIAGNOSIS — G40909 Epilepsy, unspecified, not intractable, without status epilepticus: Secondary | ICD-10-CM | POA: Diagnosis present

## 2011-07-14 DIAGNOSIS — Z950 Presence of cardiac pacemaker: Secondary | ICD-10-CM

## 2011-07-14 DIAGNOSIS — I319 Disease of pericardium, unspecified: Secondary | ICD-10-CM

## 2011-07-14 DIAGNOSIS — Z7982 Long term (current) use of aspirin: Secondary | ICD-10-CM

## 2011-07-14 DIAGNOSIS — E873 Alkalosis: Secondary | ICD-10-CM | POA: Diagnosis present

## 2011-07-14 DIAGNOSIS — I495 Sick sinus syndrome: Secondary | ICD-10-CM | POA: Diagnosis present

## 2011-07-14 DIAGNOSIS — E871 Hypo-osmolality and hyponatremia: Secondary | ICD-10-CM | POA: Diagnosis present

## 2011-07-14 DIAGNOSIS — R0600 Dyspnea, unspecified: Secondary | ICD-10-CM | POA: Diagnosis present

## 2011-07-14 DIAGNOSIS — R1310 Dysphagia, unspecified: Secondary | ICD-10-CM | POA: Diagnosis present

## 2011-07-14 DIAGNOSIS — J9601 Acute respiratory failure with hypoxia: Secondary | ICD-10-CM | POA: Diagnosis present

## 2011-07-14 DIAGNOSIS — I509 Heart failure, unspecified: Secondary | ICD-10-CM | POA: Diagnosis present

## 2011-07-14 DIAGNOSIS — J9600 Acute respiratory failure, unspecified whether with hypoxia or hypercapnia: Secondary | ICD-10-CM | POA: Diagnosis present

## 2011-07-14 DIAGNOSIS — M549 Dorsalgia, unspecified: Secondary | ICD-10-CM | POA: Diagnosis present

## 2011-07-14 DIAGNOSIS — I10 Essential (primary) hypertension: Secondary | ICD-10-CM | POA: Diagnosis present

## 2011-07-14 DIAGNOSIS — I5032 Chronic diastolic (congestive) heart failure: Secondary | ICD-10-CM | POA: Diagnosis present

## 2011-07-14 LAB — COMPREHENSIVE METABOLIC PANEL
ALT: 8 U/L (ref 0–35)
CO2: 25 mEq/L (ref 19–32)
Calcium: 9.8 mg/dL (ref 8.4–10.5)
Creatinine, Ser: 1.01 mg/dL (ref 0.50–1.10)
GFR calc Af Amer: 58 mL/min — ABNORMAL LOW (ref >90)
GFR calc non Af Amer: 50 mL/min — ABNORMAL LOW (ref >90)
Glucose, Bld: 121 mg/dL — ABNORMAL HIGH (ref 70–99)

## 2011-07-14 LAB — CBC
HCT: 36.1 % (ref 36.0–46.0)
MCH: 29.5 pg (ref 26.0–34.0)
MCHC: 34.1 g/dL (ref 30.0–36.0)
MCV: 86.6 fL (ref 78.0–100.0)
RDW: 14.1 % (ref 11.5–15.5)

## 2011-07-14 LAB — URINALYSIS, ROUTINE W REFLEX MICROSCOPIC
Glucose, UA: NEGATIVE mg/dL
Hgb urine dipstick: NEGATIVE
Protein, ur: NEGATIVE mg/dL
pH: 5 (ref 5.0–8.0)

## 2011-07-14 LAB — POCT I-STAT 3, ART BLOOD GAS (G3+)
O2 Saturation: 99 %
TCO2: 30 mmol/L (ref 0–100)
pCO2 arterial: 40.3 mmHg (ref 35.0–45.0)

## 2011-07-14 LAB — SODIUM, URINE, RANDOM: Sodium, Ur: 34 mEq/L

## 2011-07-14 LAB — PROTIME-INR: Prothrombin Time: 24.7 seconds — ABNORMAL HIGH (ref 11.6–15.2)

## 2011-07-14 LAB — OSMOLALITY, URINE: Osmolality, Ur: 238 mOsm/kg — ABNORMAL LOW (ref 390–1090)

## 2011-07-14 LAB — CARDIAC PANEL(CRET KIN+CKTOT+MB+TROPI)
CK, MB: 3 ng/mL (ref 0.3–4.0)
Total CK: 61 U/L (ref 7–177)

## 2011-07-14 LAB — MRSA PCR SCREENING: MRSA by PCR: NEGATIVE

## 2011-07-14 LAB — PRO B NATRIURETIC PEPTIDE: Pro B Natriuretic peptide (BNP): 3312 pg/mL — ABNORMAL HIGH (ref 0–450)

## 2011-07-14 NOTE — Telephone Encounter (Signed)
Follow up call:  Husband called back and states SOB is getting worse.  Can barely walk 10 ft without having to rest.  Ankles are swollen.  Taking Lasix bid.  She had a really bad night last night.

## 2011-07-14 NOTE — Telephone Encounter (Signed)
Called stating she is more SOB;can't walk 10 ft w/o getting very SOB. Unable to talk to me on phone. Advised to go to ER. Notified Trish.

## 2011-07-15 LAB — BASIC METABOLIC PANEL
Calcium: 9.5 mg/dL (ref 8.4–10.5)
Creatinine, Ser: 1.05 mg/dL (ref 0.50–1.10)
GFR calc non Af Amer: 48 mL/min — ABNORMAL LOW (ref >90)
Sodium: 124 mEq/L — ABNORMAL LOW (ref 135–145)

## 2011-07-15 LAB — URINE CULTURE
Colony Count: 1000
Culture  Setup Time: 201210261521

## 2011-07-15 LAB — DIFFERENTIAL
Basophils Relative: 0 % (ref 0–1)
Eosinophils Absolute: 0 10*3/uL (ref 0.0–0.7)
Eosinophils Relative: 0 % (ref 0–5)
Monocytes Absolute: 1.6 10*3/uL — ABNORMAL HIGH (ref 0.1–1.0)
Monocytes Relative: 14 % — ABNORMAL HIGH (ref 3–12)

## 2011-07-15 LAB — CARDIAC PANEL(CRET KIN+CKTOT+MB+TROPI)
CK, MB: 2.6 ng/mL (ref 0.3–4.0)
Relative Index: INVALID (ref 0.0–2.5)
Total CK: 39 U/L (ref 7–177)

## 2011-07-15 LAB — CBC
MCH: 29.4 pg (ref 26.0–34.0)
MCHC: 33.8 g/dL (ref 30.0–36.0)
MCV: 87.2 fL (ref 78.0–100.0)
Platelets: 206 10*3/uL (ref 150–400)

## 2011-07-16 LAB — BASIC METABOLIC PANEL
Chloride: 87 mEq/L — ABNORMAL LOW (ref 96–112)
Creatinine, Ser: 1.02 mg/dL (ref 0.50–1.10)
GFR calc Af Amer: 57 mL/min — ABNORMAL LOW (ref >90)

## 2011-07-16 LAB — CBC
MCV: 88.3 fL (ref 78.0–100.0)
Platelets: 205 10*3/uL (ref 150–400)
RDW: 14.2 % (ref 11.5–15.5)
WBC: 10.9 10*3/uL — ABNORMAL HIGH (ref 4.0–10.5)

## 2011-07-16 NOTE — H&P (Addendum)
[name], [name]                 ACCOUNT NO.:  [id number]  MEDICAL RECORD NO.:  [id number]  LOCATION:  2915                         FACILITY:  [facility]  PHYSICIAN:  Veverly Fells. Excell Seltzer, MD  DATE OF BIRTH:  1924-10-14  DATE OF ADMISSION:  07/14/2011 DATE OF DISCHARGE:                             HISTORY & PHYSICAL   PRIMARY CARDIOLOGIST: Peter M. Swaziland, MD  ELECTROPHYSIOLOGIST:  Hillis Range, MD  PRIMARY MEDICAL DOCTOR:  Dr. Sherryll Burger.  CHIEF COMPLAINT:  Shortness of breath.  REASON FOR CONSULT:  Acute on chronic diastolic heart failure.  HISTORY OF PRESENT ILLNESS:  [name] is an 77 year old female with a history of AFib with tachybrady syndrome status post recent pacemaker implantation July 04, 2011, hypertension, and diastolic heart failure in May 2012 with a similar presentation in the setting of AFib requiring cardioversion after diuresis.  She has had progressive shortness of breath, lower extremity edema, and dyspnea on exertion for the past several days.  She initially called our office and Lasix was increased to 40 p.o. b.i.d., but the patient has reported progressively less urine output.  She now gets short of breath with any degree of activity and today continues with initial symptoms on the phone triage today.  She was thus instructed to proceed to the ER where white count is elevated at 12.1, sodium 122, and BNP is 3312.  She was hypoxic on admission with a room air sat of 84%, placed on BiPAP, since, but is still requiring BiPAP.  She was given 40 mg of IV Lasix with some urine output, but the patient still gets significantly short of breath with even minimal effort to sit up.  She also feels sleepy.  She endorsed chest tightness earlier.  PAST MEDICAL HISTORY: 1. AFib.     a.     Anticoagulated with Pradaxa.     b.     Status post [employer] Jude dual-chamber pacemaker July 04, 2011      for symptomatic tachy-brady syndrome.     c.     Initial plan was for  cardioversion as an outpatient      following recovery/anticoagulation after her pacemaker      implantation. 2. Hypertension. 3. GERD. 4. Hiatal hernia. 5. Renal artery stenosis status post angioplasty. 6. Chronic back pain/arthritis. 7. Seizure disorder. 8. Urinary incontinence. 9. Diastolic heart failure, May 2012 in the setting of AFib,     subsequently had cardioversions. 10.Normal coronary arteries by cath November 2007.  SURGICAL HISTORY:  Hysterectomy, appendectomy, cholecystectomy, knee surgery, cataract repair, shoulder surgery, otherwise noted above.  , right back down to the bottom next section.  MEDICATIONS: 1. Amiodarone 200 mg p.o. b.i.d. 2. Lopressor 25 mg b.i.d. 3. Pradaxa 150 mg b.i.d. 4. Azor 10/40 mg daily. 5. Citracal plus vitamin D. 6. Depakote 500 mg at bedtime. 7. Detrol LA 4 mg daily. 8. Hydrocodone/APAP 5/500 mg q.6 hours p.r.n. 9. Lasix 40 mg b.i.d 10.Lisinopril 40 mg b.i.d. 11.MiraLax 2 teaspoons by mouth every morning as needed for     constipation. 12.Multivitamin 1 tablet daily. 13.Prilosec 40 mg daily. 14.Robaxin 500 mg daily p.r.n. 15.Timolol 1 drops both eyes b.i.d. 16.Travatan 0.04% 1  drop both eyes every evening. 17.Vitamin D3 1000 units every morning.  ALLERGIES:  No known drug allergies.  SOCIAL HISTORY:  The patient is remarried.  She has 3 children.  She is retired from [employer] order.  She denies any tobacco or alcohol use.  FAMILY HISTORY:  Her mother died at 33 with diabetes, CVA, and heart disease.  Father died at 73 of diabetes and also had prostate problems.  REVIEW OF SYSTEMS:  No fevers, chills, nausea, vomiting.  She has had decreased urine output.  No syncope.  All of his systems reviewed and otherwise negative.  LABORATORY DATA:  WBC 12.1, hemoglobin 12.3, hematocrit 36.1, platelet count 253,000.  Sodium 122, which was 130 on October 17, potassium 3.9, chloride 84, CO2 25, glucose 121, BUN 16, creatinine 1.01.   Anion gap is 13, magnesium 1.9.  LFTs are normal with the exception of albumin 3.1, INR 2.19 but this is negligible in setting of being on Pradaxa. Troponins negative x1.  EKG:  AFib, 81 beats per minute with nonspecific ST-T changes.  On telemetry, she is intermittently V paced.  RADIOLOGIC STUDIES:  Chest x-ray, pacemaker unchanged in position. Cardiomegaly.  Progressive airspace disease, left greater than right suggestive of asymmetric pulmonary edema.  PHYSICAL EXAMINATION:  VITAL SIGNS:  Temperature 97.6, pulse 68, respirations 16, blood pressure 103/71, pulse ox 99% on BiPAP, 84% on room air requiring O2 on admission. GENERAL:  This is a pleasant, but ill-appearing white female, in no acute distress on BiPAP.  HEENT:  Normocephalic, atraumatic. Extraocular movements intact.  Clear sclerae.  Nares without discharge. NECK:  Supple without carotid bruit but elevated JVD. HEART:  Auscultation of the heart reveals a regular rhythm, regular rate without murmurs, rubs, or gallops. LUNGS:  Lung sounds are coarse breath sounds throughout, crackles at the left base halfway up and basilar right base rales. ABDOMEN:  Soft, nontender, with normoactive bowel sounds.  Slightly distended.  No rebound or guarding. EXTREMITIES:  Warm and dry, 2+ lower extremity edema, right greater than left. NEUROLOGIC:  She is alert and oriented x3.  Responds to questions appropriately.  She was sleepy initially, but awoke easily and participated in the conversation.  ASSESSMENT/PLAN:  The patient was seen and examined by Dr. Excell Seltzer and myself.  [name] is a pleasant 77 year old lady with a history of atrial fibrillation with symptomatic tachybrady syndrome status post recent pacemaker implantation, normal coronary arteries, diastolic heart failure in the setting of previous AFib who presents with progressive shortness of breath, lower extremity edema, and dyspnea on exertion for the presentation and  lab/radiologic findings consistent with acute on chronic diastolic heart failure.  At this time, we will admit the patient to the hospital and cycle cardiac enzymes.  We will treat her with 80 mg of IV b.i.d.  Lasix.  We will check a stat 2D echocardiogram to rule out effusion given her recent procedural studies.  We will hold her Azor given that she is already on lisinopril.  Her Pradaxa will be continued for now.  Her other home medications will be continued for now.  She is not actively wheezing.  We will continue her low-dose beta- blocker.  We have considered acute pulmonary toxicity from amiodarone as an alternative diagnosis, but at present her presentation is much more consistent with heart failure, so we will continue amiodarone as well.  In regards to her hyponatremia, this is likely a hypotonic picture in the setting of hypervolemia.  However, we will also check his  urine osmolalities/sodium, serum osmolalities, and follow with a BMET in the morning.  This may account for lethargy, but she now also be hypercarbic, so we are getting an ABG.  Lastly we feel that her leukocytosis is related to her acute heart failure, but we will check a urinalysis given recent decrease in urine output.     Ronie Spies, P.A.C.   ______________________________ Veverly Fells. Excell Seltzer, MD    DD/MEDQ  D:  07/14/2011  T:  07/14/2011  Job:  [id number]  cc:   Peter M. Swaziland, M.D. Hillis Range, MD Dr. Sherryll Burger  Electronically Signed by Tonny Bollman MD on 07/16/2011 10:10:03 PM Electronically Signed by Ronie Spies  on 07/18/2011 02:17:08 PM

## 2011-07-17 ENCOUNTER — Ambulatory Visit: Payer: Medicare Other | Admitting: *Deleted

## 2011-07-17 DIAGNOSIS — I4891 Unspecified atrial fibrillation: Secondary | ICD-10-CM

## 2011-07-17 LAB — BASIC METABOLIC PANEL
CO2: 36 mEq/L — ABNORMAL HIGH (ref 19–32)
Calcium: 9.8 mg/dL (ref 8.4–10.5)
GFR calc non Af Amer: 60 mL/min — ABNORMAL LOW (ref >90)
Sodium: 128 mEq/L — ABNORMAL LOW (ref 135–145)

## 2011-07-18 ENCOUNTER — Inpatient Hospital Stay (HOSPITAL_COMMUNITY): Payer: Medicare Other

## 2011-07-18 LAB — BASIC METABOLIC PANEL
CO2: 37 mEq/L — ABNORMAL HIGH (ref 19–32)
Chloride: 85 mEq/L — ABNORMAL LOW (ref 96–112)
Potassium: 4.2 mEq/L (ref 3.5–5.1)
Sodium: 130 mEq/L — ABNORMAL LOW (ref 135–145)

## 2011-07-18 LAB — CBC
Platelets: 244 10*3/uL (ref 150–400)
RBC: 3.79 MIL/uL — ABNORMAL LOW (ref 3.87–5.11)
WBC: 10.9 10*3/uL — ABNORMAL HIGH (ref 4.0–10.5)

## 2011-07-19 ENCOUNTER — Inpatient Hospital Stay (HOSPITAL_COMMUNITY): Payer: Medicare Other

## 2011-07-19 ENCOUNTER — Ambulatory Visit: Payer: Medicare Other | Admitting: Internal Medicine

## 2011-07-19 DIAGNOSIS — J90 Pleural effusion, not elsewhere classified: Secondary | ICD-10-CM

## 2011-07-19 DIAGNOSIS — J810 Acute pulmonary edema: Secondary | ICD-10-CM

## 2011-07-19 DIAGNOSIS — R0902 Hypoxemia: Secondary | ICD-10-CM

## 2011-07-19 DIAGNOSIS — R059 Cough, unspecified: Secondary | ICD-10-CM

## 2011-07-19 DIAGNOSIS — R05 Cough: Secondary | ICD-10-CM

## 2011-07-19 LAB — CBC
HCT: 34.1 % — ABNORMAL LOW (ref 36.0–46.0)
MCV: 90.2 fL (ref 78.0–100.0)
RBC: 3.78 MIL/uL — ABNORMAL LOW (ref 3.87–5.11)
WBC: 9.3 10*3/uL (ref 4.0–10.5)

## 2011-07-19 LAB — BASIC METABOLIC PANEL
BUN: 15 mg/dL (ref 6–23)
CO2: 40 mEq/L (ref 19–32)
Chloride: 84 mEq/L — ABNORMAL LOW (ref 96–112)
Creatinine, Ser: 0.8 mg/dL (ref 0.50–1.10)

## 2011-07-20 ENCOUNTER — Inpatient Hospital Stay (HOSPITAL_COMMUNITY): Payer: Medicare Other

## 2011-07-20 DIAGNOSIS — J810 Acute pulmonary edema: Secondary | ICD-10-CM

## 2011-07-20 DIAGNOSIS — R0902 Hypoxemia: Secondary | ICD-10-CM

## 2011-07-20 DIAGNOSIS — J90 Pleural effusion, not elsewhere classified: Secondary | ICD-10-CM

## 2011-07-20 DIAGNOSIS — I4891 Unspecified atrial fibrillation: Secondary | ICD-10-CM

## 2011-07-20 DIAGNOSIS — R05 Cough: Secondary | ICD-10-CM

## 2011-07-20 LAB — BASIC METABOLIC PANEL
CO2: 41 mEq/L (ref 19–32)
Calcium: 10.2 mg/dL (ref 8.4–10.5)
Chloride: 83 mEq/L — ABNORMAL LOW (ref 96–112)
Creatinine, Ser: 0.95 mg/dL (ref 0.50–1.10)
Glucose, Bld: 158 mg/dL — ABNORMAL HIGH (ref 70–99)
Sodium: 132 mEq/L — ABNORMAL LOW (ref 135–145)

## 2011-07-20 NOTE — Op Note (Signed)
NAMEREIKO, VINJE NO.:  [id number]  MEDICAL RECORD NO.:  [id number]  LOCATION:  2014                         FACILITY:  [facility]  PHYSICIAN:  Hillis Range, MD       DATE OF BIRTH:  1925/04/08  DATE OF PROCEDURE: DATE OF DISCHARGE:                              OPERATIVE REPORT   SURGEON:  Hillis Range, MD  PREPROCEDURE DIAGNOSES: 1. Tachycardia bradycardia syndrome 2. Symptomatic persistent atrial fibrillation.  POSTPROCEDURE DIAGNOSES: 1. Tachycardia bradycardia syndrome. 2. Symptomatic persistent atrial fibrillation.  PROCEDURES: 1. Left upper extremity venography. 2. Pacemaker implantation.  INTRODUCTION:  [name] is a pleasant 77 year old female with a history of symptomatic persistent atrial fibrillation and tachycardia bradycardia syndrome.  She was initially diagnosed with atrial fibrillation in May 2012 after presenting to her primary care physician with dizziness and shortness of breath.  A event monitor revealed heart rates up to the 110s as well as pauses of 4 seconds during atrial fibrillation.  She was also in acute diastolic congestive heart failure at that time and therefore was admitted to [facility].  She underwent cardioversion.  She did well initially.  Unfortunately she has developed recurrent symptomatic atrial fibrillation.  She now presents for pacemaker implantation for tachycardia bradycardia syndrome.  DESCRIPTION OF PROCEDURE:  Informed written consent was obtained, and the patient was brought to the Electrophysiology Lab in the fasting state.  She was adequately sedated with intravenous Versed and fentanyl as outlined in the nursing report.  The patient's left chest was prepped and draped in the usual sterile fashion by the EP lab staff.  The skin overlying the left deltopectoral region was infiltrated with lidocaine for local analgesia.  A 4 cm incision was made over the left deltopectoral region.  A left  subcutaneous pacemaker pocket was fashioned using a combination of sharp and blunt dissection. Electrocautery was required to assure hemostasis.  A venogram of the left upper extremity was performed which revealed a large left axillary vein which entered into a large left subclavian vein.  Prominent valve was noted within the left axillary vein.  The left axillary vein was cannulated with fluoroscopic visualization.  Through the left axillary vein, a [employer] Medical Tendril STS model [phone] (serial number [id number]) right atrial lead and a [employer] Medical IsoFlex model 1948/58 (serial number [id number]) right ventricular lead were advanced with fluoroscopic visualization into the right atrial appendage and right ventricular apex positions respectively.  Atrial fibrillation waves measured 0.6 mV with an impedance of 457 ohms.  Right ventricular lead R-waves measured 12 mV with impedance of 836 ohms and a threshold of 0.5 V at 0.4 msec.  Both leads were secured to the pectoralis fascia using #2 silk suture over the suture sleeves.  The leads were then connected to a [employer]. [employer] Medical Accent DR RF, model [id number] (serial [id number]) pacemaker.  The pocket was irrigated with copious gentamicin solution.  The pacemaker was then placed into the pocket.  The pocket was then closed in 2 layers with 2-0 Vicryl suture for the subcutaneous and subcuticular layers.  Steri-Strips and a sterile dressing were then applied. There were no early apparent  complications.  CONCLUSIONS: 1. Successful implantation of a [employer]. Jude Medical accent DR RF     pacemaker for tachycardia bradycardia syndrome. 2. No early apparent complications.     Hillis Range, MD     JA/MEDQ  D:  07/04/2011  T:  07/05/2011  Job:  [id number]  cc:   [name], M.D. Peter M. Swaziland, M.D.  Electronically Signed by Hillis Range MD on 07/20/2011 02:34:35 PM

## 2011-07-20 NOTE — Discharge Summary (Signed)
[name], Jacqueline NO.:  [id number]  MEDICAL RECORD NO.:  [id number]  LOCATION:  2014                         FACILITY:  [facility]  PHYSICIAN:  Hillis Range, MD       DATE OF BIRTH:  1925-08-15  DATE OF ADMISSION:  07/04/2011 DATE OF DISCHARGE:  07/05/2011                              DISCHARGE SUMMARY   PRIMARY CARE PHYSICIAN:  Dr. Buren Kos.  CARDIOLOGIST:  Dr. Peter Swaziland.  ELECTROPHYSIOLOGIST:  Hillis Range, MD  PRIMARY DIAGNOSIS:  Symptomatic tachycardia-bradycardia syndrome - status post pacemaker this admission.  SECONDARY DIAGNOSES: 1. Hypertension. 2. Renal artery stenosis, status post angioplasty. 3. Gastroesophageal reflux disease. 4. Chronic back pain. 5. Hyponatremia. 6. Seizure disorder. 7. Urinary incontinence. 8. Osteopenia. 9. Chronic anticoagulation with Pradaxa. 10.Status post partial hysterectomy. 11.Arthritis. 12.Hiatal hernia. 13.Dizziness.  ALLERGIES:  The patient has no known drug allergies.  PROCEDURES THIS ADMISSION: 1. Implantation of a dual-chamber pacemaker by Dr. Johney Frame on July 04, 2011.  The patient received a [employer]. Jude Medical, model [id number]     pacemaker with model #2088TC right atrial lead and model #1948     right ventricular lead.  The patient had no early apparent     complications. 2. Chest x-ray on July 05, 2011 demonstrated no pneumothorax,     status post device implant.  LABORATORY DATA THIS ADMISSION: Sodium 130, potassium 4.2, glucose 100, BUN 12, and creatinine 0.8.  BRIEF HISTORY OF PRESENT ILLNESS:  [name] is an 77 year old female with recently diagnosed atrial fibrillation.  She was referred to Dr. Johney Frame in the outpatient setting for treatment options.  She was diagnosed with AFib in May, 2012 after presenting to her primary care physician with dizziness.  She was initiated on Coumadin and had an event monitor placed which demonstrated tachy-brady syndrome with heart rates up  to 114 beats per minute with pauses of 4 seconds.  She developed progressive heart failure and was admitted to [facility] for cardioversion.  She has had recurrent symptomatic atrial fibrillation post cardioversion.  It was felt that she would require pacemaker implantation prior to initiation of antiarrhythmic therapy.  Risks, benefits, and alternatives to the procedure were reviewed with the patient.  She wished to proceed.  HOSPITAL COURSE:  The patient was admitted on July 04, 2011, for planned implantation of a dual-chamber pacemaker.  This was carried out by Dr. Johney Frame, with details outlined above.  She was monitored on telemetry overnight which demonstrated atrial fibrillation with occasional ventricular pacing.  Chest x-ray was obtained which demonstrated no pneumothorax, status post device implant.  Her device was interrogated and found to be functioning normally.  Her left chest without hematoma or bruit.  Her oxygen saturations overnight were 91% on 2 L.  At the time of Dr. Jenel Lucks examination on October 17, she was on room air fairly comfortably.  Dr. Johney Frame examined the patient on October 17 with plans to observe off oxygen with ambulation.  Plans for discharge home after ambulation with cardiac rehab per Dr. Johney Frame.  We will resume the patient's Pradaxa 150 mg twice daily on July 06, 2011.  We will also begin amiodarone  200 mg 1 tab twice daily for rate control and plans for future cardioversion.  She will follow up with Dr. Swaziland in 2 weeks to arrange for cardioversion.  FOLLOWUP APPOINTMENTS: 1.  [facility] on July 17, 2011 at 11 a.m.] 2. Norma Fredrickson, nurse practitioner with Dr. Swaziland on July 24, 2011 at 10 a.m. 3. Dr. Clelia Croft as scheduled. 4. Dr. Johney Frame in 3 months - the office will call to schedule an     appointment.  DISCHARGE INSTRUCTIONS: 1. Increase activity slowly. 2. No driving for 1 week. 3. Follow low-sodium and  heart-healthy diet. 4. See supplemental device discharge instructions for wound care and     mobility. 5. Keep incisions clean and dry for 1 week.  DISCHARGE MEDICATIONS: 1. Amiodarone 200 mg 1 tablet twice daily. 2. Metoprolol 25 mg 1 tablet twice daily. 3. Prozac 150 mg 1 tablet twice daily - to start on October 18. 4. Azor 10/40 mg daily. 5. Citracal plus vitamin D 2 tablets daily. 6. Depakote 500 mg daily. 7. Detrol LA 4 mg daily. 8. Vicodin 5/500 mg 1 tablet every 6 hours as needed pain. 9. Lasix 40 mg daily. 10.Lisinopril 40 mg twice daily. 11.MiraLax 2 teaspoons by mouth every morning as needed constipation. 12.Multivitamin daily. 13.Prilosec 40 mg daily. 14.Robaxin 500 mg daily. 15.Singulair 10 mg daily. 16.Timolol eye drops 1 drop in both eyes twice daily. 17.Travatan 1 drop in both eyes every evening. 18.Vitamin D3 1000 one tablet every morning.  DISPOSITION:  The patient was seen and examined by Dr. Johney Frame on July 05, 2011 and considered stable for discharge.  THE PATIENT'S DISCHARGE ENCOUNTER:  135 minutes.     Gypsy Balsam, RN,BSN   ______________________________ Hillis Range, MD    AS/MEDQ  D:  07/05/2011  T:  07/05/2011  Job:  [id number]  cc:   [name], M.D. Peter M. Swaziland, M.D.  Electronically Signed by Gypsy Balsam RNBSN on 07/20/2011 07:43:45 AM Electronically Signed by Hillis Range MD on 07/20/2011 02:34:39 PM

## 2011-07-21 ENCOUNTER — Inpatient Hospital Stay (HOSPITAL_COMMUNITY): Payer: Medicare Other

## 2011-07-21 LAB — BASIC METABOLIC PANEL
CO2: 42 mEq/L (ref 19–32)
Calcium: 9.8 mg/dL (ref 8.4–10.5)
Chloride: 85 mEq/L — ABNORMAL LOW (ref 96–112)
Glucose, Bld: 98 mg/dL (ref 70–99)
Sodium: 133 mEq/L — ABNORMAL LOW (ref 135–145)

## 2011-07-22 DIAGNOSIS — I5033 Acute on chronic diastolic (congestive) heart failure: Secondary | ICD-10-CM

## 2011-07-22 LAB — BASIC METABOLIC PANEL
BUN: 16 mg/dL (ref 6–23)
BUN: 16 mg/dL (ref 6–23)
CO2: 39 mEq/L — ABNORMAL HIGH (ref 19–32)
Calcium: 9.9 mg/dL (ref 8.4–10.5)
Chloride: 87 mEq/L — ABNORMAL LOW (ref 96–112)
Creatinine, Ser: 0.86 mg/dL (ref 0.50–1.10)
Creatinine, Ser: 0.99 mg/dL (ref 0.50–1.10)
GFR calc non Af Amer: 51 mL/min — ABNORMAL LOW (ref >90)
Glucose, Bld: 96 mg/dL (ref 70–99)
Glucose, Bld: 99 mg/dL (ref 70–99)
Potassium: 4 mEq/L (ref 3.5–5.1)

## 2011-07-22 MED ORDER — METOPROLOL TARTRATE 25 MG PO TABS
25.0000 mg | ORAL_TABLET | Freq: Two times a day (BID) | ORAL | Status: DC
  Administered 2011-07-23 – 2011-07-24 (×3): 25 mg via ORAL
  Filled 2011-07-22 (×6): qty 1

## 2011-07-22 MED ORDER — AMIODARONE HCL 200 MG PO TABS
200.0000 mg | ORAL_TABLET | Freq: Every day | ORAL | Status: DC
  Filled 2011-07-22 (×2): qty 1

## 2011-07-22 MED ORDER — DIVALPROEX SODIUM ER 500 MG PO TB24
500.0000 mg | ORAL_TABLET | Freq: Every day | ORAL | Status: DC
  Administered 2011-07-23: 500 mg via ORAL
  Filled 2011-07-22 (×3): qty 1

## 2011-07-22 MED ORDER — FUROSEMIDE 10 MG/ML IJ SOLN: 80.0000 mg | Freq: Two times a day (BID) | INTRAMUSCULAR | Status: DC

## 2011-07-22 MED ORDER — POTASSIUM CHLORIDE CRYS ER 20 MEQ PO TBCR
40.0000 meq | EXTENDED_RELEASE_TABLET | Freq: Every day | ORAL | Status: DC
Start: 2011-07-22 — End: 2011-07-22

## 2011-07-22 MED ORDER — GUAIFENESIN 100 MG/5ML PO SOLN
15.0000 mL | ORAL | Status: DC | PRN
  Filled 2011-07-22: qty 15

## 2011-07-22 MED ORDER — LISINOPRIL 20 MG PO TABS
20.0000 mg | ORAL_TABLET | Freq: Two times a day (BID) | ORAL | Status: DC
  Administered 2011-07-23 – 2011-07-24 (×3): 20 mg via ORAL
  Filled 2011-07-22 (×6): qty 1

## 2011-07-22 MED ORDER — TOLTERODINE TARTRATE ER 4 MG PO CP24
4.0000 mg | ORAL_CAPSULE | Freq: Every day | ORAL | Status: DC
  Administered 2011-07-23: 4 mg via ORAL
  Filled 2011-07-22 (×3): qty 1

## 2011-07-22 MED ORDER — ALPRAZOLAM 0.25 MG PO TABS: 0.2500 mg | ORAL_TABLET | Freq: Two times a day (BID) | ORAL | Status: DC | PRN

## 2011-07-22 MED ORDER — BIOTENE DRY MOUTH MT LIQD
15.0000 mL | OROMUCOSAL | Status: DC
  Administered 2011-07-23 – 2011-07-24 (×2): 15 mL via OROMUCOSAL

## 2011-07-22 MED ORDER — STARCH (THICKENING) PO POWD
ORAL | Status: DC | PRN
  Filled 2011-07-22: qty 227

## 2011-07-22 MED ORDER — TRAVOPROST (BAK FREE) 0.004 % OP SOLN
1.0000 [drp] | Freq: Every day | OPHTHALMIC | Status: DC
  Administered 2011-07-23: 1 [drp] via OPHTHALMIC
  Filled 2011-07-22: qty 2.5

## 2011-07-22 MED ORDER — MONTELUKAST SODIUM 10 MG PO TABS
10.0000 mg | ORAL_TABLET | Freq: Every day | ORAL | Status: DC
  Administered 2011-07-23: 10 mg via ORAL
  Filled 2011-07-22 (×2): qty 1

## 2011-07-22 MED ORDER — TIMOLOL MALEATE 0.5 % OP SOLN
1.0000 [drp] | Freq: Two times a day (BID) | OPHTHALMIC | Status: DC
  Administered 2011-07-23 – 2011-07-24 (×3): 1 [drp] via OPHTHALMIC
  Filled 2011-07-22: qty 5

## 2011-07-22 MED ORDER — DABIGATRAN ETEXILATE MESYLATE 150 MG PO CAPS
150.0000 mg | ORAL_CAPSULE | Freq: Two times a day (BID) | ORAL | Status: DC
  Administered 2011-07-23 – 2011-07-24 (×3): 150 mg via ORAL
  Filled 2011-07-22 (×6): qty 1

## 2011-07-22 MED ORDER — ONDANSETRON HCL 4 MG/2ML IJ SOLN: 4.0000 mg | Freq: Four times a day (QID) | INTRAMUSCULAR | Status: DC | PRN

## 2011-07-22 MED ORDER — POLYETHYLENE GLYCOL 3350 17 G PO PACK
17.0000 g | PACK | Freq: Every day | ORAL | Status: DC | PRN
  Filled 2011-07-22: qty 1

## 2011-07-22 MED ORDER — CHLORHEXIDINE GLUCONATE 0.12 % MT SOLN
15.0000 mL | Freq: Two times a day (BID) | OROMUCOSAL | Status: DC
  Administered 2011-07-23 – 2011-07-24 (×4): 15 mL via OROMUCOSAL
  Filled 2011-07-22 (×9): qty 15

## 2011-07-22 MED ORDER — PANTOPRAZOLE SODIUM 40 MG PO TBEC
40.0000 mg | DELAYED_RELEASE_TABLET | Freq: Every day | ORAL | Status: DC
  Administered 2011-07-23 – 2011-07-24 (×3): 40 mg via ORAL
  Filled 2011-07-22: qty 1

## 2011-07-22 MED ORDER — ZOLPIDEM TARTRATE 5 MG PO TABS: 5.0000 mg | ORAL_TABLET | Freq: Every evening | ORAL | Status: DC | PRN

## 2011-07-22 NOTE — Cardiovascular Report (Signed)
  [name], [name] NO.:  [id number]  MEDICAL RECORD NO.:  [id number]  LOCATION:  2915                         FACILITY:  [facility]  PHYSICIAN:  Marca Ancona, MD      DATE OF BIRTH:  Dec 27, 1924  DATE OF PROCEDURE: DATE OF DISCHARGE:                           CARDIAC CATHETERIZATION   PROCEDURE:  Direct current cardioversion.  INDICATION:  This is an 77 year old patient with symptomatic persistent atrial fibrillation.  Plans were made for cardioversion.  She has been on Pradaxa 75 mg twice a day.  She was brought up for a TEE prior to procedure.  TEE showed left atrial appendage smoke, but no thrombus. The patient was then sedated by Anesthesiology using 30 mg of IV propofol.  She received a 200 J biphasic shock with pads in the anterior and the AP orientation.  She converted to sinus rhythm with the first shock.  No immediate complications.     Marca Ancona, MD     DM/MEDQ  D:  07/17/2011  T:  07/18/2011  Job:  [id number] cc:   Hillis Range, MD  Electronically Signed by Marca Ancona MD on 07/22/2011 11:55:09 PM

## 2011-07-23 ENCOUNTER — Encounter (HOSPITAL_COMMUNITY): Payer: Self-pay | Admitting: Internal Medicine

## 2011-07-23 DIAGNOSIS — J9601 Acute respiratory failure with hypoxia: Secondary | ICD-10-CM | POA: Diagnosis present

## 2011-07-23 DIAGNOSIS — R1310 Dysphagia, unspecified: Secondary | ICD-10-CM | POA: Diagnosis present

## 2011-07-23 LAB — BASIC METABOLIC PANEL
BUN: 14 mg/dL (ref 6–23)
CO2: 38 mEq/L — ABNORMAL HIGH (ref 19–32)
Calcium: 10 mg/dL (ref 8.4–10.5)
GFR calc non Af Amer: 61 mL/min — ABNORMAL LOW (ref >90)
Glucose, Bld: 92 mg/dL (ref 70–99)
Sodium: 132 mEq/L — ABNORMAL LOW (ref 135–145)

## 2011-07-23 MED ORDER — IPRATROPIUM BROMIDE 0.02 % IN SOLN
0.5000 mg | Freq: Four times a day (QID) | RESPIRATORY_TRACT | Status: DC
  Administered 2011-07-23 – 2011-07-24 (×2): 0.5 mg via RESPIRATORY_TRACT
  Filled 2011-07-23 (×7): qty 2.5

## 2011-07-23 MED ORDER — LEVALBUTEROL HCL 0.63 MG/3ML IN NEBU
0.6300 mg | INHALATION_SOLUTION | Freq: Four times a day (QID) | RESPIRATORY_TRACT | Status: DC
  Administered 2011-07-23 – 2011-07-24 (×2): 0.63 mg via RESPIRATORY_TRACT
  Filled 2011-07-23 (×7): qty 3

## 2011-07-23 NOTE — Consult Note (Signed)
Reason for Consult:Ongoing hypoxia, failure to thrive Referring Physician: Nahser  [name] is an 77 y.o. female.  HPI: 77 year old white female with history of Afib s/p recent pacemaker placement, CHF secondary to diastolic dysfunction admitted 82/95 with hypoxic respiratory failure and hyponatremia.  She has improved some with diuresis but continues to have persistent O2 requirement and infiltrates on CXR despite aggressive diuresis.  Speech therapy evaluation has indicated aspiration risk and patient does note increased cough with eating.  She denies fever, chills, sweats.  Has had a progressive decline since 5/12 when she was diagnosed with Afib.  Underwent DCCV in 5/12 and pacemaker placement on 10/16.   Past Medical History  Diagnosis Date  . Pneumonia 04/2010  . Hypertension   . Renal artery stenosis 2008    STATUS POST ANGIOPLASTY  . GERD (gastroesophageal reflux disease)   . Back pain     CHRONIC  . Atrial fibrillation     Prior TEE/CV in May 2012  . Hyponatremia     improved  . Seizure disorder     on anti-epileptic therapy  . Urinary incontinence   . Osteopenia   . S/P partial hysterectomy   . SOB (shortness of breath)   . SIADH (syndrome of inappropriate ADH production)     h/o HYPONATREMIA ; LIKELY RELATED TO HER LUNG PROCESS  . Arthritis   . Hiatal hernia   . Dizziness   . Joint pain   . Hiatal hernia   . Tachy-brady syndrome     with pauses noted in May 2012    Past Surgical History  Procedure Date  . Cholecystectomy   . Back surgery     X2  . Total knee arthroplasty     RIGHT AND LEFT KNEE  . Tonsillectomy   . Appendectomy   . Cardiac catheterization 2007    Normal  . Knee arthroscopy     LEFT KNEE  . Cataract extraction   . Shoulder surgery   . Renal angioplasty 01/2007    RIGHT  . Tee with cv 01/2011  . Partial hysterectomy     Family History  Problem Relation Age of Onset  . Stroke Mother   . Diabetes Mother   . Heart disease Mother     . Stroke Father   . Diabetes Father   . Cancer Father     PROSTATE  . Cancer Brother     RENAL AND BLADDER  . Lung disease Brother   . Lung disease Sister   . Hypertension Daughter   . Hypertension Son     Social History:  reports that she has never smoked. She has never used smokeless tobacco. She reports that she does not drink alcohol or use illicit drugs.  Married, 3 children, 2 grandchildren, 4 great-grandchildren.  Retired [employer] order.  Allergies: No Known Allergies  Medications:  Prior to Admission:  Prescriptions prior to admission  Medication Sig Dispense Refill  . amiodarone (PACERONE) 200 MG tablet Take 200 mg by mouth 2 (two) times daily.        Marland Kitchen amLODipine-olmesartan (AZOR) 10-40 MG per tablet Take 1 tablet by mouth daily.        . Calcium Citrate (CITRACAL PO) Take 2 tablets by mouth daily.       . cholecalciferol (VITAMIN D) 1000 UNITS tablet Take 1,000 Units by mouth daily.        . dabigatran (PRADAXA) 150 MG CAPS Take 150 mg by mouth every 12 (  twelve) hours.        . divalproex (DEPAKOTE) 500 MG EC tablet Take 500 mg by mouth at bedtime.        . furosemide (LASIX) 40 MG tablet Take 40 mg by mouth 2 (two) times daily.        Marland Kitchen HYDROcodone-acetaminophen (VICODIN) 5-500 MG per tablet Take 1 tablet by mouth every 6 (six) hours as needed. For pain       . lisinopril (PRINIVIL,ZESTRIL) 40 MG tablet Take 40 mg by mouth 2 (two) times daily.        . methocarbamol (ROBAXIN) 500 MG tablet Take 500 mg by mouth daily as needed. For pain      . metoprolol tartrate (LOPRESSOR) 25 MG tablet Take 25 mg by mouth 2 (two) times daily.        . montelukast (SINGULAIR) 10 MG tablet Take 10 mg by mouth daily.       . Multiple Vitamins-Minerals (MULTIVITAMINS THER. Jacqueline/MINERALS) TABS Take 1 tablet by mouth daily.        Marland Kitchen omeprazole (PRILOSEC) 40 MG capsule Take 40 mg by mouth daily.        . polyethylene glycol powder (GLYCOLAX/MIRALAX) powder Take 8 g by mouth daily as needed.  For constipation      . timolol (BETIMOL) 0.5 % ophthalmic solution Place 1 drop into both eyes 2 (two) times daily.       Marland Kitchen tolterodine (DETROL LA) 4 MG 24 hr capsule Take 4 mg by mouth daily.        . travoprost, benzalkonium, (TRAVATAN) 0.004 % ophthalmic solution Place 1 drop into both eyes at bedtime.        Scheduled:   . antiseptic oral rinse  15 mL Mouth Rinse Custom  . chlorhexidine  15 mL Mouth/Throat BID  . dabigatran  150 mg Oral Q12H  . divalproex  500 mg Oral QHS  . lisinopril  20 mg Oral BID  . metoprolol tartrate  25 mg Oral BID  . montelukast  10 mg Oral Daily  . pantoprazole  40 mg Oral Q1200  . timolol  1 drop Both Eyes BID  . tolterodine  4 mg Oral QHS  . Travoprost (BAK Free)  1 drop Both Eyes QHS    Results for orders placed during the hospital encounter of 07/14/11 (from the past 48 hour(s))  BASIC METABOLIC PANEL     Status: Abnormal   Collection Time   07/22/11  5:00 AM      Component Value Range Comment   Sodium 132 (*) 135 - 145 (mEq/L)    Potassium 4.1  3.5 - 5.1 (mEq/L)    Chloride 87 (*) 96 - 112 (mEq/L)    CO2 39 (*) 19 - 32 (mEq/L)    Glucose, Bld 96  70 - 99 (mg/dL)    BUN 16  6 - 23 (mg/dL)    Creatinine, Ser 6.21  0.50 - 1.10 (mg/dL)    Calcium 9.9  8.4 - 10.5 (mg/dL)    GFR calc non Af Amer 61 (*) >90 (mL/min)    GFR calc Af Amer 70 (*) >90 (mL/min)   BASIC METABOLIC PANEL     Status: Abnormal   Collection Time   07/22/11 10:05 PM      Component Value Range Comment   Sodium 131 (*) 135 - 145 (mEq/L)    Potassium 4.0  3.5 - 5.1 (mEq/L)    Chloride 87 (*) 96 - 112 (mEq/L)  CO2 39 (*) 19 - 32 (mEq/L)    Glucose, Bld 99  70 - 99 (mg/dL)    BUN 16  6 - 23 (mg/dL)    Creatinine, Ser 1.61  0.50 - 1.10 (mg/dL)    Calcium 9.8  8.4 - 10.5 (mg/dL)    GFR calc non Af Amer 51 (*) >90 (mL/min)    GFR calc Af Amer 59 (*) >90 (mL/min)   BASIC METABOLIC PANEL     Status: Abnormal   Collection Time   07/23/11  5:00 AM      Component Value Range  Comment   Sodium 132 (*) 135 - 145 (mEq/L)    Potassium 3.9  3.5 - 5.1 (mEq/L)    Chloride 88 (*) 96 - 112 (mEq/L)    CO2 38 (*) 19 - 32 (mEq/L)    Glucose, Bld 92  70 - 99 (mg/dL)    BUN 14  6 - 23 (mg/dL)    Creatinine, Ser 0.96  0.50 - 1.10 (mg/dL)    Calcium 04.5  8.4 - 10.5 (mg/dL)    GFR calc non Af Amer 61 (*) >90 (mL/min)    GFR calc Af Amer 70 (*) >90 (mL/min)     No results found.  Review of Systems  Constitutional: Positive for malaise/fatigue. Negative for fever, chills and weight loss.  Eyes: Negative for blurred vision.  Respiratory: Positive for cough and shortness of breath. Negative for wheezing.   Cardiovascular: Negative for chest pain, palpitations, orthopnea, leg swelling and PND.  Gastrointestinal: Negative for heartburn, nausea, vomiting, abdominal pain, diarrhea and constipation.  Genitourinary: Negative for dysuria.  Musculoskeletal: Positive for joint pain (left shoulder pain and decreased range of motion, chronic).  Skin: Negative for rash.  Neurological: Negative for dizziness, focal weakness and headaches.  Endo/Heme/Allergies: Does not bruise/bleed easily.  Psychiatric/Behavioral: Negative for memory loss.  All other systems reviewed and are negative.   Blood pressure 134/69, pulse 59, temperature 98 F (36.7 C), temperature source Oral, resp. rate 20, height 5\' 3"  (1.6 m), weight 67.6 kg (149 lb 0.5 oz), SpO2 97.00%. Physical Exam  Constitutional: She is oriented to person, place, and time. She appears well-developed and well-nourished. No distress.  HENT:  Head: Normocephalic.  Eyes: Conjunctivae and EOM are normal. Pupils are equal, round, and reactive to light.  Neck: Neck supple. No thyromegaly present.  Cardiovascular: Normal rate, regular rhythm and normal heart sounds.  Exam reveals no gallop and no friction rub.   No murmur heard. Respiratory: Effort normal. She has wheezes (mild expiratory wheezing).  GI: She exhibits no distension and  no mass. There is no tenderness. There is no rebound and no guarding.  Musculoskeletal: She exhibits no edema.       Decreased range of motion left shoulder  Lymphadenopathy:    She has no cervical adenopathy.  Neurological: She is oriented to person, place, and time. No cranial nerve deficit. She exhibits normal muscle tone. Coordination normal.       Motor strength 4/4  Skin: Skin is warm. No erythema.  Psychiatric: She has a normal mood and affect. Her behavior is normal.    Assessment/Plan: 1. Hypoxic Respiratory Failure- acute on chronic dyspnea appears to be multifactorial related to CHF (diastolic dysfunction) superimposed on possible chronic lung disease.  She feels better with diuresis but continues to require O2.  Suspect she has an underlying Interstitial Lung Disease vs. chronic aspiration pneumonitis.   Consideration includes Churgg Stefanie Libel related to singulair use- will discontinue  Singulair. Would recommend pulmonary evaluation with PFTs to further evaluate.  This can be arranged as an outpatient if patient is otherwise stable for discharge.  Appears that she may need O2.  Will add xopenex and atrovent for mild wheezing. 2. Dysphagia- swallow re-evaluation planned for tomorrow. 4. Hyponatremia- at baseline.  Monitor with ongoing diuresis.  Likely baseline SIADH related to underlying lung condition 3. Atrial Fibrillation s/p pacemaker placement- per Cards.   4. CHF secondary to diastolic dysfunction- diuresis per Cardiology. 5. Failure to Thrive- as above.  Recommend Home Health PT/OT on discharge.    Appreciate Cardiology management of this patient.  Will follow course with you and I will follow closely in office to help with transition home.     [name],[name] 07/23/2011, 5:32 PM

## 2011-07-23 NOTE — Progress Notes (Signed)
Speech Language/Pathology Speech Language Pathology Treatment Patient Details Name: [name] MRN: [id number] DOB: 09/16/1925 Today's Date: 07/23/2011  SLP Assessment/Plan/Recommendation Assessment Clinical Impression Statement: Pt independently takes small individual sips.  No straw.  Plan Speech Therapy Frequency: min 2x/week Duration: 2 weeks Treatment/Interventions: Compensatory strategies;Patient/family education Potential to Achieve Goals: Good Potential Considerations: Ability to learn/carryover information;Family/community support Individuals Consulted Consulted and Agree with Results and Recommendations: Patient;Family member/caregiver Family Member Consulted : spouse  SLP Goals     SLP Treatment General Patient observed directly with PO's: Yes Type of PO's observed: Nectar-thick liquids Feeding: Able to feed self Liquids provided via: Cup Respiratory Status: Supplemental O2 delivered via (comment) (2L) Behavior/Cognition: Alert;Cooperative;Pleasant mood Patient Positioning: Upright in bed  Oral Cavity - Oral Hygiene Does patient have any of the following "at risk" factors?: Diet - patient on thickened liquids   Assessment of Diet Tolerance Clinical Impression Statement: Pt independently takes small individual sips.  No straw.  Supervision/Assistance required: No Recommendations: Continue current diet   Leigh Aurora 07/23/2011, 10:22 AM

## 2011-07-23 NOTE — Progress Notes (Signed)
Subjective:  [name] is an elderly female with hx of diastolic heart failure admitted with dyspnea on 07/14/11.  She has been aggressively diuresed and actually developed a contraction alkalosis.  She has failed her modified barium swallow study   She is breathing better .  She is not ambulating outside her room yet.   Objective:  Vital Signs in the last 24 hours: Blood pressure 121/65, pulse 60, temperature 97.7 F (36.5 C), temperature source Oral, resp. rate 19, height 5\' 3"  (1.6 m), weight 149 lb 0.5 oz (67.6 kg), SpO2 98.00%. Temp:  [97.7 F (36.5 C)] 97.7 F (36.5 C) (11/04 0548) Pulse Rate:  [60] 60  (11/04 0548) Resp:  [19] 19  (11/04 0548) BP: (121)/(65) 121/65 mmHg (11/04 0548) SpO2:  [98 %] 98 % (11/04 0548) Weight:  [149 lb 0.5 oz (67.6 kg)-150 lb 5.7 oz (68.2 kg)] 149 lb 0.5 oz (67.6 kg) (11/04 0548)  Intake/Output from previous day: 11/03 0701 - 11/04 0700 In: -  Out: 100 [Urine:100] Intake/Output from this shift: Total I/O In: 240 [P.O.:240] Out: -   Physical Exam: The patient is alert and oriented x 3.  The mood and affect are normal.   Skin: warm and dry.  Color is normal.    HEENT:   the sclera are nonicteric.  The mucous membranes are moist.  The carotids are 2+ without bruits.  There is no thyromegaly.  There is no JVD.    Lungs: few basilar rales  Heart: regular rate with a normal S1 and S2.  There are no murmurs, gallops, or rubs. The PMI is not displaced.     Abdomen: good bowel sounds.  There is no guarding or rebound.  There is no hepatosplenomegaly or tenderness.  There are no masses.   Extremities:  no clubbing, cyanosis, or edema.  The legs are without rashes.  The distal pulses are intact.   Neuro:  Cranial nerves II - XII are intact.  Motor and sensory functions are intact.     Lab Results:   Basename 07/23/11 0500 07/22/11 2205  NA 132* 131*  K 3.9 4.0  CL 88* 87*  CO2 38* 39*  GLUCOSE 92 99  BUN 14 16  CREATININE 0.86 0.99    Tele: A-paced   Assessment/Plan:  Patient Active Hospital Problem List:  Dyspnea    Assessment: Her dyspnea has improved.  She still has a contraction alkalosis.  I do not think she actually had worsening diastolic heart failure.  She has evidence of aspiration and failed her modified barium swallow.  Speech path has recommended a full barium swallow.  I have asked Dr. Clelia Croft to see her Monday for help with this issue.   Plan: Barium swallow tomorrow ( DG esophagus)  Hypertension    Assessment: BP is well controlled   Plan: continue current meds  Atrial fibrillation (02/01/2011)   Assessment: she has been on Amiodarone and is  A-paced presently.  We have stopped the amiodarone due to her dyspnea and the possibility of pulmonary fibrosis.  We will try to get a full set of PFTs   Plan: continue current meds  Acute on chronic diastolic heart failure (02/06/2011)   Assessment: she is breathing much better.  Lasix is on hold.  Will restart PO lasix at some point   Plan: continue to watch for now  Anticoagulant long-term use (06/28/2011)   Assessment: on Pradaxa   Plan: continue current meds     Vesta Mixer, Montez Hageman.,  MD, [facility] 07/23/2011, 10:04 AM

## 2011-07-24 ENCOUNTER — Inpatient Hospital Stay (HOSPITAL_COMMUNITY): Payer: Medicare Other

## 2011-07-24 ENCOUNTER — Encounter: Payer: Medicare Other | Admitting: Nurse Practitioner

## 2011-07-24 DIAGNOSIS — J810 Acute pulmonary edema: Secondary | ICD-10-CM

## 2011-07-24 DIAGNOSIS — R0902 Hypoxemia: Secondary | ICD-10-CM

## 2011-07-24 DIAGNOSIS — J90 Pleural effusion, not elsewhere classified: Secondary | ICD-10-CM

## 2011-07-24 LAB — DIFFERENTIAL
Basophils Absolute: 0.1 10*3/uL (ref 0.0–0.1)
Basophils Relative: 1 % (ref 0–1)
Monocytes Absolute: 0.9 10*3/uL (ref 0.1–1.0)
Neutro Abs: 6.7 10*3/uL (ref 1.7–7.7)
Neutrophils Relative %: 72 % (ref 43–77)

## 2011-07-24 LAB — CBC
HCT: 35.4 % — ABNORMAL LOW (ref 36.0–46.0)
MCHC: 33.1 g/dL (ref 30.0–36.0)
RDW: 14.2 % (ref 11.5–15.5)

## 2011-07-24 LAB — BASIC METABOLIC PANEL
Chloride: 88 mEq/L — ABNORMAL LOW (ref 96–112)
Creatinine, Ser: 0.88 mg/dL (ref 0.50–1.10)
GFR calc Af Amer: 69 mL/min — ABNORMAL LOW (ref >90)
GFR calc non Af Amer: 59 mL/min — ABNORMAL LOW (ref >90)

## 2011-07-24 MED ORDER — FUROSEMIDE 40 MG PO TABS: 40.0000 mg | ORAL_TABLET | Freq: Every day | ORAL | Status: DC

## 2011-07-24 MED ORDER — LEVALBUTEROL HCL 0.63 MG/3ML IN NEBU
0.6300 mg | INHALATION_SOLUTION | Freq: Four times a day (QID) | RESPIRATORY_TRACT | Status: DC | PRN
  Filled 2011-07-24: qty 3

## 2011-07-24 MED ORDER — PANTOPRAZOLE SODIUM 40 MG PO TBEC: 40.0000 mg | DELAYED_RELEASE_TABLET | Freq: Every day | ORAL | Status: DC

## 2011-07-24 MED ORDER — IPRATROPIUM BROMIDE 0.02 % IN SOLN: 0.5000 mg | Freq: Four times a day (QID) | RESPIRATORY_TRACT | Status: DC | PRN

## 2011-07-24 NOTE — Progress Notes (Signed)
  [name] is an 77 y.o. female.      Subjective Improved and plan to go home today Vital signs for last 24 hours: Temp:  [98 F (36.7 C)-98.3 F (36.8 C)] 98.1 F (36.7 C) (11/05 0540) Pulse Rate:  [59-61] 59  (11/05 0540) Resp:  [18-20] 20  (11/05 0540) BP: (132-150)/(69-76) 150/76 mmHg (11/05 0540) SpO2:  [97 %-99 %] 98 % (11/05 0540) FiO2 (%):  [2 %] 2 % (11/05 0540) Weight:  [67.4 kg (148 lb 9.4 oz)] 148 lb 9.4 oz (67.4 kg) (11/05 0540)  Intake/Output from previous day: 11/04 0701 - 11/05 0700 In: 720 [P.O.:720] Out: 1175 [Urine:1175]  Intake/Output this shift:    EXAM Gen: Pleasant, well-nourished, in no distress,  normal affect  ENT: No lesions,  mouth clear,  oropharynx clear, no postnasal drip  Neck: No JVD, no TMG, no carotid bruits  Lungs: No use of accessory muscles, no dullness to percussion, clearer  Cardiovascular: RRR, heart sounds normal, no murmur or gallops, no peripheral edema  Abdomen: soft and NT, no HSM,  BS normal  Musculoskeletal: No deformities, no cyanosis or clubbing  Neuro: alert, non focal  Skin: Warm, no lesions or rashes   LAB RESULTS  WBC  Date/Time Value Range Status  07/24/2011  6:00 AM 9.3  4.0-10.5 (K/uL) Final     Platelets  Date/Time Value Range Status  07/24/2011  6:00 AM 216  150-400 (K/uL) Final     Hemoglobin  Date/Time Value Range Status  07/24/2011  6:00 AM 11.7* 12.0-15.0 (g/dL) Final     HCT  Date/Time Value Range Status  07/24/2011  6:00 AM 35.4* 36.0-46.0 (%) Final  ] Sodium  Date/Time Value Range Status  07/24/2011  6:00 AM 131* 135-145 (mEq/L) Final     Potassium  Date/Time Value Range Status  07/24/2011  6:00 AM 4.0  3.5-5.1 (mEq/L) Final     Chloride  Date/Time Value Range Status  07/24/2011  6:00 AM 88* 96-112 (mEq/L) Final     CO2  Date/Time Value Range Status  07/24/2011  6:00 AM 38* 19-32 (mEq/L) Final     BUN  Date/Time Value Range Status  07/24/2011  6:00 AM 13  6-23  (mg/dL) Final     Creatinine, Ser  Date/Time Value Range Status  07/24/2011  6:00 AM 0.88  0.50-1.10 (mg/dL) Final     Glucose, Bld  Date/Time Value Range Status  07/24/2011  6:00 AM 98  70-99 (mg/dL) Final  ]  ABG    Component Value Date/Time   PHART 7.456* 07/14/2011 1423   HCO3 28.4* 07/14/2011 1423   TCO2 30 07/14/2011 1423   O2SAT 99.0 07/14/2011 1423    Impression/Plan  Principal Problem:  *Acute respiratory failure with hypoxia  Likely due to amiodarone toxicity Ok to d/c off amiodarone today F/u Pulm clinic 08/18/11 at 1015AM Friday

## 2011-07-24 NOTE — Progress Notes (Signed)
Pt ambulated approximately 175 feet  without O2. O2 SAT 94% and HR 60 prior to ambulation. O2 SAT dropped to 91% at the lowest reading. HR remained constant @ 60 bpm. No SOB or dizziness reported.

## 2011-07-24 NOTE — Discharge Summary (Signed)
Physician Discharge Summary  Patient ID: [name] MRN: [id number] DOB/AGE: 75-Oct-1926 75 y.o.  Admit date: 07/14/2011 Discharge date: 07/24/2011  Admission Diagnoses:  Discharge Diagnoses:  Principal Problem:  *Acute respiratory failure with hypoxia Active Problems:  Dyspnea  Hypertension  Atrial fibrillation  Acute on chronic diastolic heart failure  Anticoagulant long-term use  Dysphagia   Discharged Condition: good  Hospital Course:  Pt was admitted with hypoxia and CHF.  She has been aggressively diuresed and actually developed a contraction alkalosis. She has failed her modified barium swallow study and had had another one in radiology.  It showed:  1. Mild esophageal reflux. 2. No mucosal irregularity, stricture, or mass. 3. Mild esophageal dysmotility.  She has been started on a PPI. Dr Clelia Croft saw the patient and will arrange PFTs and OP Pulm consult. She did not qualify for home O2 as sats were staying above 90% with ambulation.  Pt OK to d/c home.     Consults: Dr Buren Kos  Significant Diagnostic Studies: radiology: X-Ray: Barium Swallow, CXR  Treatments: cardiac meds: metoprolol, lasix  Discharge Exam: Blood pressure 112/61, pulse 60, temperature 98.6 F (37 C), temperature source Oral, resp. rate 19, height 5\' 3"  (1.6 m), weight 148 lb 9.4 oz (67.4 kg), SpO2 91.00%.   Disposition: Home or Self Care  Discharge Orders    Future Appointments: Provider: Department: Dept Phone: Center:   08/21/2011 3:00 PM Peter [place], MD [facility] [phone] None   07/09/2012 9:00 AM [facility] [phone] [facility]   07/09/2012 10:00 AM [name], MD [facility] [phone] [facility]     Current Discharge Medication List    START taking these medications   Details  pantoprazole (PROTONIX) 40 MG tablet Take 1 tablet (40 mg total) by mouth daily at 12 noon. Qty: 30 tablet, Refills: 3      CONTINUE these medications which have NOT CHANGED    Details  amLODipine-olmesartan (AZOR) 10-40 MG per tablet Take 1 tablet by mouth daily.      Calcium Citrate (CITRACAL PO) Take 2 tablets by mouth daily.     cholecalciferol (VITAMIN D) 1000 UNITS tablet Take 1,000 Units by mouth daily.      dabigatran (PRADAXA) 150 MG CAPS Take 150 mg by mouth every 12 (twelve) hours.      divalproex (DEPAKOTE) 500 MG EC tablet Take 500 mg by mouth at bedtime.      furosemide (LASIX) 40 MG tablet Take 40 mg by mouth1 times daily.      HYDROcodone-acetaminophen (VICODIN) 5-500 MG per tablet Take 1 tablet by mouth every 6 (six) hours as needed. For pain     lisinopril (PRINIVIL,ZESTRIL) 40 MG tablet Take 40 mg by mouth 2 (two) times daily.      methocarbamol (ROBAXIN) 500 MG tablet Take 500 mg by mouth daily as needed. For pain    metoprolol tartrate (LOPRESSOR) 25 MG tablet Take 25 mg by mouth 2 (two) times daily.      montelukast (SINGULAIR) 10 MG tablet Take 10 mg by mouth daily.     Multiple Vitamins-Minerals (MULTIVITAMINS THER. W/MINERALS) TABS Take 1 tablet by mouth daily.      omeprazole (PRILOSEC) 40 MG capsule Take 40 mg by mouth daily.      polyethylene glycol powder (GLYCOLAX/MIRALAX) powder Take 8 g by mouth daily as needed. For constipation    timolol (BETIMOL) 0.5 % ophthalmic solution Place 1 drop into both eyes 2 (two) times daily.  tolterodine (DETROL LA) 4 MG 24 hr capsule Take 4 mg by mouth daily.      travoprost, benzalkonium, (TRAVATAN) 0.004 % ophthalmic solution Place 1 drop into both eyes at bedtime.       STOP taking these medications     amiodarone (PACERONE) 200 MG tablet          Signed: Theodore Demark 07/24/2011, 2:30 PM

## 2011-07-24 NOTE — Progress Notes (Signed)
Utilization Review Complete.  [name] 07/24/2011  

## 2011-07-24 NOTE — Progress Notes (Signed)
[id number] Cardiac Rehab  Completed CHF education with pt.and husband. Discussed CHF s&s, daily weights,low sodium diet , exercise guidelines and when to call MD. They voice understanding.

## 2011-07-24 NOTE — Progress Notes (Signed)
Subjective: See consult note from last pm for additional details.  Husband able to provide additional history this am.  Patient has had persistent shortness of breath since at least 4/12 when she was diagnosed with Afib.  SOB is clearly worse when she is in Afib and improved with diuresis and rhythm control.  She feels that this has been chronic and she is approaching baseline at this point.  Feels much better than when she was admitted.  No new complaints.  Objective: Vital signs in last 24 hours: Temp:  [98 F (36.7 C)-98.3 F (36.8 C)] 98.1 F (36.7 C) (11/05 0540) Pulse Rate:  [59-61] 59  (11/05 0540) Resp:  [18-20] 20  (11/05 0540) BP: (132-150)/(69-76) 150/76 mmHg (11/05 0540) SpO2:  [97 %-99 %] 98 % (11/05 0540) FiO2 (%):  [2 %] 2 % (11/05 0540) Weight:  [67.4 kg (148 lb 9.4 oz)] 148 lb 9.4 oz (67.4 kg) (11/05 0540) Weight change: -0.8 kg (-1 lb 12.2 oz) Last BM Date: 07/21/11  Intake/Output from previous day: 11/04 0701 - 11/05 0700 In: 720 [P.O.:720] Out: 1175 [Urine:1175] Intake/Output this shift: Total I/O In: -  Out: 525 [Urine:525]  General appearance: alert and no distress Head: Normocephalic, without obvious abnormality, atraumatic Eyes: conjunctivae/corneas clear. PERRL, EOM's intact. Fundi benign. Nose: Nares normal. Septum midline. Mucosa normal. No drainage or sinus tenderness., no discharge Throat: lips, mucosa, and tongue normal; teeth and gums normal Neck: no adenopathy, no JVD and thyroid not enlarged, symmetric, no tenderness/mass/nodules Resp: minimal bibasilar crackles, wheezing resolved Cardio: regular rate and rhythm, S1, S2 normal, no murmur, click, rub or gallop GI: soft, non-tender; bowel sounds normal; no masses,  no organomegaly Extremities: no edema, redness or tenderness in the calves or thighs Neurologic: Grossly normal, alert and orient X 4  Lab Results: pending No results found for this basename: WBC:2,HGB:2,HCT:2,PLT:2 in the last 72  hours BMET  Basename 07/23/11 0500 07/22/11 2205  NA 132* 131*  K 3.9 4.0  CL 88* 87*  CO2 38* 39*  GLUCOSE 92 99  BUN 14 16  CREATININE 0.86 0.99  CALCIUM 10.0 9.8    Studies/Results: No results found.  Medications:  Scheduled:   . antiseptic oral rinse  15 mL Mouth Rinse Custom  . chlorhexidine  15 mL Mouth/Throat BID  . dabigatran  150 mg Oral Q12H  . divalproex  500 mg Oral QHS  . ipratropium  0.5 mg Nebulization Q6H  . levalbuterol  0.63 mg Nebulization Q6H  . lisinopril  20 mg Oral BID  . metoprolol tartrate  25 mg Oral BID  . pantoprazole  40 mg Oral Q1200  . timolol  1 drop Both Eyes BID  . tolterodine  4 mg Oral QHS  . Travoprost (BAK Free)  1 drop Both Eyes QHS  . DISCONTD: montelukast  10 mg Oral Daily    Assessment/Plan: Principal Problem:  1. *Acute on chronic respiratory failure with hypoxia- she has chronic dyspnea since at least 4/12 when she was diagnosed with Afib which is clearly worse with Afib and CHF and improved with rhythm control and diuresis.  However, her chronic hypoxia and CXR findings (present since 2004) suggest the possibility of underlying lung dise (Interstitial lung disease vs. Chronic aspiration pneumonitis).  Recommend outpatient Pulmonary Consult with PFTs.  Singulair discontinued due to association of Chrugg-Strauss (low suspicion in absence of eosinophilia).  Findings preceded Amiodarone initiation.  Started Xopenex/Atrovent nebs last pm with improvement in wheezing.  Unsure whether it has helped breathing.  May need home O2 and nebulizer arrangements.  Assess need for O2 with ambulation.  2. Dysphagia- scheduled for swallow evaluation today.   3. Hypertension- relatively stable per cardiology. 4. Atrial fibrillation- currently in normal rhythm.  On Pradaxa for anticoagulation. 5. Acute on chronic diastolic heart failure- improved after diuresis. 6. Dispo- OK for discharge from my standpoint when OK with Cardiology.  I will see her in  the office within 1 week to arrange outpatient pulmonology evaluation with PFTs.       LOS: 10 days   Valentine Kuechle,W DOUGLAS 07/24/2011, 6:42 AM

## 2011-07-24 NOTE — Progress Notes (Signed)
TELEMETRY: Reviewed telemetry pt in AV sequential pacing. No Afib.: Filed Vitals:   07/23/11 1300 07/23/11 2117 07/23/11 2133 07/24/11 0540  BP: 134/69 142/76  150/76  Pulse: 59 61  59  Temp: 98 F (36.7 C) 98.3 F (36.8 C)  98.1 F (36.7 C)  TempSrc: Oral Oral  Oral  Resp: 20 18  20   Height:      Weight:    67.4 kg (148 lb 9.4 oz)  SpO2: 97% 97% 99% 98%    Intake/Output Summary (Last 24 hours) at 07/24/11 0841 Last data filed at 07/24/11 0545  Gross per 24 hour  Intake    720 ml  Output   1175 ml  Net   -455 ml    SUBJECTIVE Reports breathing much better compared to one week ago. No swelling. No choking.  Swallowing evaluation is pending.  LABS: Basic Metabolic Panel:  Basename 07/24/11 0600 07/23/11 0500  NA 131* 132*  K 4.0 3.9  CL 88* 88*  CO2 38* 38*  GLUCOSE 98 92  BUN 13 14  CREATININE 0.88 0.86  CALCIUM 9.8 10.0  MG -- --  PHOS -- --   Liver Function Tests: No results found for this basename: AST:2,ALT:2,ALKPHOS:2,BILITOT:2,PROT:2,ALBUMIN:2 in the last 72 hours No results found for this basename: LIPASE:2,AMYLASE:2 in the last 72 hours CBC:  Basename 07/24/11 0600  WBC 9.3  NEUTROABS 6.7  HGB 11.7*  HCT 35.4*  MCV 89.4  PLT 216   Cardiac Enzymes: No results found for this basename: CKTOTAL:3,CKMB:3,CKMBINDEX:3,TROPONINI:3 in the last 72 hours BNP: No results found for this basename: POCBNP:3 in the last 72 hours D-Dimer: No results found for this basename: DDIMER:2 in the last 72 hours Hemoglobin A1C: No results found for this basename: HGBA1C in the last 72 hours Fasting Lipid Panel: No results found for this basename: CHOL,HDL,LDLCALC,TRIG,CHOLHDL,LDLDIRECT in the last 72 hours Thyroid Function Tests: No results found for this basename: TSH,T4TOTAL,FREET3,T3FREE,THYROIDAB in the last 72 hours Anemia Panel: No results found for this basename: VITAMINB12,FOLATE,FERRITIN,TIBC,IRON,RETICCTPCT in the last 72 hours  Radiology/Studies:  Dg  Chest 2 View  07/21/2011  *RADIOLOGY REPORT*  Clinical Data: Shortness of breath and chest pain  CHEST - 2 VIEW  Comparison: 07/19/2011  Findings:  There is a left chest wall pacer device with leads in the right atrial appendage and right ventricle.  The heart size appears normal.  Interval decrease in volume of bilateral pleural effusions.  Multifocal bilateral airspace densities are unchanged when compared with the previous examination.  The visualized osseous structures are negative.  IMPRESSION:  1.  No significant change in bilateral multifocal airspace opacities. 2.  Improvement in pleural effusions.  Original Report Authenticated By: Rosealee Albee, M.D.   Dg Chest 2 View  07/18/2011  *RADIOLOGY REPORT*  Clinical Data: Very short of breath  CHEST - 2 VIEW  Comparison: Portable chest x-ray of 07/14/2011  Findings: There may be slight improvement in asymmetric airspace disease left greater than right.  Considerations are that of pneumonia versus asymmetrical edema.  Cardiomegaly is stable.  A permanent pacemaker remains.  IMPRESSION: Little change to slight improvement in asymmetric airspace disease. Question pneumonia versus asymmetrical edema.  Original Report Authenticated By: Juline Patch, M.D.   Dg Chest 2 View  07/05/2011  *RADIOLOGY REPORT*  Clinical Data: Pacemaker insertion.  CHEST - 2 VIEW  Comparison: Chest x-ray 07/04/2011.  Findings: There is a left-sided permanent pacemaker in place. Right atrial and ventricular wires are well positioned without complicating features.  No pneumothorax or hematoma.  Chronic lung changes with emphysema and pulmonary fibrosis.  IMPRESSION: Pacer wires in good position without complicating features.  Original Report Authenticated By: P. Loralie Champagne, M.D.   Dg Chest 2 View Done In Ssc.  07/04/2011  *RADIOLOGY REPORT*  Clinical Data: Preoperative assessment, tachybrady syndrome, hypertension  CHEST - 2 VIEW  Comparison: 02/06/2011  Findings:  Enlargement of cardiac silhouette. Calcified tortuous aorta. Pulmonary vascular congestion. Emphysematous and chronic bronchitic changes. Interstitial changes in both lungs increased since previous exam question minimal pulmonary edema. No segmental consolidation, gross pleural effusion or pneumothorax. Severe osseous demineralization with chronic height loss of a mid thoracic vertebra.  IMPRESSION: Enlargement of cardiac silhouette with pulmonary vascular congestion and question minimal pulmonary edema. Underlying emphysematous and bronchitic changes.  Original Report Authenticated By: Lollie Marrow, M.D.   Ct Chest Wo Contrast  07/19/2011  *RADIOLOGY REPORT*  Clinical Data: Short of breath.  Question pleural effusion.  CT CHEST WITHOUT CONTRAST  Technique:  Multidetector CT imaging of the chest was performed following the standard protocol without IV contrast.  Comparison: Plain film of 07/18/2011.  Most recent CT of 10/10/2007.  Findings: Lung windows demonstrate minimal motion degradation. Patent airways.  Slightly left greater than right patchy airspace and ground-glass opacities.  Soft tissue windows demonstrate tortuous descending thoracic aorta. Moderate cardiomegaly.  Small left and small to moderate right pleural effusion. These layer posteriorly, without evidence of loculation.  Pulmonary outflow tract measures 3.6 cm.  Small middle mediastinal lymph nodes.  Limited evaluation for hilar adenopathy, given unenhanced technique.  8 mm prevascular node on image 22 is enlarged and 6 mm on the prior.  Small right cardiophrenic angle nodes, likely reactive.  Limited abdominal imaging demonstrates prominence of the lateral segment left liver lobe.  Subtle irregular hepatic contour.  Mild osteopenia.  Numerous nonacute left-sided rib fractures.  Posterior 10th through 12th.  This could be subacute, and are new since 01/20 209.  IMPRESSION:  1.  Bilateral ground-glass and airspace patchy opacities.  Favor  multifocal infection.  Asymmetric alveolar edema felt less likely. 2.  Right greater than left pleural effusions, without complexity identified. 3. Pulmonary artery enlargement suggests pulmonary arterial hypertension. 4.  Irregular hepatic contour.  Suspect mild cirrhosis.  Correlate with risk factors. 5.  Left-sided rib fractures, possibly subacute.  Original Report Authenticated By: Consuello Bossier, M.D.   Dg Chest Portable 1 View  07/14/2011  *RADIOLOGY REPORT*  Clinical Data: Shortness of breath.  PORTABLE CHEST - 1 VIEW  Comparison: 07/05/2011.  Findings: Sequential pacemaker enters from the left with leads unchanged in position.  Cardiomegaly.  Progressive asymmetric air space disease slightly greater on the left suggestive of asymmetric pulmonary edema.  Infectious infiltrate not entirely excluded in the proper clinical setting.  No gross pneumothorax.  Minimally tortuous aorta.  IMPRESSION: Sequential pacemaker enters from the left with leads unchanged in position.  Cardiomegaly.  Progressive asymmetric air space disease slightly greater on the left suggestive of asymmetric pulmonary edema.  Original Report Authenticated By: Fuller Canada, M.D.   Dg Swallowing Func-no Report Transport: Dorene Sorrow  07/20/2011  CLINICAL DATA: dysphagia   FLUOROSCOPY FOR SWALLOWING FUNCTION STUDY:  Fluoroscopy was provided for swallowing function study, which was  administered by a speech pathologist.  Final results and recommendations  from this study are contained within the speech pathology report.      PHYSICAL EXAM General: Well developed, well nourished, in no acute distress. Head: Normocephalic, atraumatic, sclera non-icteric, no  xanthomas, nares are without discharge. Neck: Negative for carotid bruits. JVD not elevated. Lungs: Mild dry crackles bilaterally. Breathing is unlabored. Heart: RRR S1 S2 without murmurs, rubs, or gallops.  Abdomen: Soft, non-tender, non-distended with normoactive bowel sounds.  No hepatomegaly. No rebound/guarding. No obvious abdominal masses. Msk:  Strength and tone appears normal for age. Extremities: No clubbing, cyanosis or edema.  Distal pedal pulses are 2+ and equal bilaterally. Neuro: Alert and oriented X 3. Moves all extremities spontaneously. Psych:  Responds to questions appropriately with a normal affect.  ASSESSMENT AND PLAN:  Principal Problem:  *Acute respiratory failure with hypoxia Active Problems:  Atrial fibrillation  Dyspnea  Hypertension  Acute on chronic diastolic heart failure  Anticoagulant long-term use  Dysphagia   Plan: Appreciate Dr. Alver Fisher input.  CXR and esophageal swallowing study pending today. Patient appears to be euvolemic. Rhythm is stable. Will check O2 sat on RA. If needed will arrange home O2. Will plan on discharge today after swallowing eval. Dr. Clelia Croft to arrange PFTs and pulmonary consult as outpatient. I will see again in 2-3 weeks.  Signed, Bohdan Macho [place] MD

## 2011-07-25 ENCOUNTER — Telehealth: Payer: Self-pay | Admitting: *Deleted

## 2011-07-25 NOTE — Discharge Summary (Signed)
Discussed with Dr. Johney Frame. We will continue amiodarone 200 mg daily. Pulmonary issues predated initiation of amiodarone.

## 2011-07-25 NOTE — Telephone Encounter (Signed)
Called pt per Dr. Elvis Coil request. She was d/c today and was told to stop Amiodarone. Dr. [place] spoke w/Dr. Johney Frame and they want her to continue Amiodarone 200 mg daily. She verbalizes understanding.

## 2011-08-07 ENCOUNTER — Encounter: Payer: Self-pay | Admitting: Critical Care Medicine

## 2011-08-08 ENCOUNTER — Inpatient Hospital Stay: Payer: Medicare Other | Admitting: Critical Care Medicine

## 2011-08-15 ENCOUNTER — Ambulatory Visit: Payer: Medicare Other | Admitting: Cardiology

## 2011-08-16 ENCOUNTER — Ambulatory Visit: Payer: Medicare Other | Admitting: Cardiology

## 2011-08-18 ENCOUNTER — Inpatient Hospital Stay: Payer: Medicare Other | Admitting: Critical Care Medicine

## 2011-08-21 ENCOUNTER — Encounter: Payer: Self-pay | Admitting: Internal Medicine

## 2011-08-21 ENCOUNTER — Ambulatory Visit (INDEPENDENT_AMBULATORY_CARE_PROVIDER_SITE_OTHER): Payer: Medicare Other | Admitting: *Deleted

## 2011-08-21 ENCOUNTER — Encounter: Payer: Self-pay | Admitting: Cardiology

## 2011-08-21 ENCOUNTER — Ambulatory Visit (INDEPENDENT_AMBULATORY_CARE_PROVIDER_SITE_OTHER): Payer: Medicare Other | Admitting: Cardiology

## 2011-08-21 VITALS — BP 110/62 | HR 60 | Ht 62.0 in | Wt 157.0 lb

## 2011-08-21 DIAGNOSIS — I5032 Chronic diastolic (congestive) heart failure: Secondary | ICD-10-CM

## 2011-08-21 DIAGNOSIS — K219 Gastro-esophageal reflux disease without esophagitis: Secondary | ICD-10-CM

## 2011-08-21 DIAGNOSIS — I509 Heart failure, unspecified: Secondary | ICD-10-CM

## 2011-08-21 DIAGNOSIS — I495 Sick sinus syndrome: Secondary | ICD-10-CM

## 2011-08-21 DIAGNOSIS — R5383 Other fatigue: Secondary | ICD-10-CM

## 2011-08-21 DIAGNOSIS — I4891 Unspecified atrial fibrillation: Secondary | ICD-10-CM

## 2011-08-21 LAB — PACEMAKER DEVICE OBSERVATION
AL AMPLITUDE: 2.2 mv
AL IMPEDENCE PM: 462.5 Ohm
AL THRESHOLD: 1.25 V
RV LEAD AMPLITUDE: 12 mv
RV LEAD IMPEDENCE PM: 750 Ohm

## 2011-08-21 MED ORDER — AMIODARONE HCL 200 MG PO TABS: 200.0000 mg | ORAL_TABLET | Freq: Every day | ORAL | Status: DC

## 2011-08-21 NOTE — Assessment & Plan Note (Signed)
She is status post permanent pacemaker implant. Her pacer site looks good. We will check her pacemaker function today.

## 2011-08-21 NOTE — Assessment & Plan Note (Signed)
Her breathing seems to be much better and her lungs are clear on exam today. She does have some lower extremity edema but we will continue with her current diuretic dose. She needs to restrict her sodium intake.

## 2011-08-21 NOTE — Assessment & Plan Note (Signed)
This appears to be well controlled on amiodarone 200 mg daily. Her pacemaker interrogation will tell us if she's had recurrent episodes. She will remain on anticoagulation and amiodarone. I will see her back again in 3 months and we will check chemistries and TSH at that time.

## 2011-08-21 NOTE — Assessment & Plan Note (Signed)
She does complain of some increased acid reflux symptoms. I recommended increasing her Prilosec to 40 mg daily.

## 2011-08-21 NOTE — Progress Notes (Signed)
[name] Date of Birth: [birth year]   History of Present Illness: [name] is seen back in the office today for a followup visit. She is status post permanent pacemaker implant in October. Following this she was admitted with increased shortness of breath. This is felt to be multifactorial with some diastolic dysfunction and dysphagia leading to aspiration. Since her last discharge she has been doing well. Her breathing has improved. Her pulse is been very regular. She is still on amiodarone at 200 mg per day. She does have some lower extremity edema and complaints of some increased reflux symptoms. She's had no further dizziness or syncope. She is on Pradaxa for anticoagulation.  Current Outpatient Prescriptions on File Prior to Visit  Medication Sig Dispense Refill  . amLODipine-olmesartan (AZOR) 10-40 MG per tablet Take 1 tablet by mouth daily.        . Calcium Citrate (CITRACAL PO) Take 2 tablets by mouth daily.       . cholecalciferol (VITAMIN D) 1000 UNITS tablet Take 1,000 Units by mouth daily.        . dabigatran (PRADAXA) 150 MG CAPS Take 150 mg by mouth every 12 (twelve) hours.        . divalproex (DEPAKOTE) 500 MG EC tablet Take 500 mg by mouth at bedtime.        . furosemide (LASIX) 40 MG tablet Take 1 tablet (40 mg total) by mouth daily.  30 tablet  11  . HYDROcodone-acetaminophen (VICODIN) 5-500 MG per tablet Take 1 tablet by mouth every 6 (six) hours as needed. For pain       . lisinopril (PRINIVIL,ZESTRIL) 40 MG tablet Take 40 mg by mouth 2 (two) times daily.        . methocarbamol (ROBAXIN) 500 MG tablet Take 500 mg by mouth daily as needed. For pain      . metoprolol tartrate (LOPRESSOR) 25 MG tablet Take 25 mg by mouth 2 (two) times daily.        . Multiple Vitamins-Minerals (MULTIVITAMINS THER. W/MINERALS) TABS Take 1 tablet by mouth daily.        Marland Kitchen omeprazole (PRILOSEC) 40 MG capsule Take 40 mg by mouth daily.        . polyethylene glycol powder (GLYCOLAX/MIRALAX)  powder Take 8 g by mouth daily as needed. For constipation      . timolol (BETIMOL) 0.5 % ophthalmic solution Place 1 drop into both eyes 2 (two) times daily.       . travoprost, benzalkonium, (TRAVATAN) 0.004 % ophthalmic solution Place 1 drop into both eyes at bedtime.         No Known Allergies  Past Medical History  Diagnosis Date  . Pneumonia 04/2010  . Hypertension   . Renal artery stenosis 2008    STATUS POST ANGIOPLASTY  . GERD (gastroesophageal reflux disease)   . Back pain     CHRONIC  . Atrial fibrillation     Prior TEE/CV in May 2012  . Hyponatremia     improved  . Seizure disorder     on anti-epileptic therapy  . Urinary incontinence   . Osteopenia   . S/P partial hysterectomy   . SOB (shortness of breath)   . SIADH (syndrome of inappropriate ADH production)     h/o HYPONATREMIA ; LIKELY RELATED TO HER LUNG PROCESS  . Arthritis   . Hiatal hernia   . Dizziness   . Joint pain   . Hiatal hernia   . Tachy-brady  syndrome     with pauses noted in May 2012  . Dysphagia     Past Surgical History  Procedure Date  . Cholecystectomy   . Back surgery     X2  . Total knee arthroplasty     RIGHT AND LEFT KNEE  . Tonsillectomy   . Appendectomy   . Cardiac catheterization 2007    Normal  . Knee arthroscopy     LEFT KNEE  . Cataract extraction   . Shoulder surgery   . Renal angioplasty 01/2007    RIGHT  . Tee with cv 01/2011  . Partial hysterectomy     History  Smoking status  . Never Smoker   Smokeless tobacco  . Never Used    History  Alcohol Use No    Family History  Problem Relation Age of Onset  . Stroke Mother   . Diabetes Mother   . Heart disease Mother   . Stroke Father   . Diabetes Father   . Cancer Father     PROSTATE  . Cancer Brother     RENAL AND BLADDER  . Lung disease Brother   . Lung disease Sister   . Hypertension Daughter   . Hypertension Son     Review of Systems: The review of systems is positive for GERD. No chest  pain.  All other systems were reviewed and are negative.  Physical Exam: BP 110/62  Pulse 60  Ht 5\' 2"  (1.575 m)  Wt 157 lb (71.215 kg)  BMI 28.72 kg/m2 Patient is very pleasant and in no acute distress. She looks fairly good. Her color is good. Skin is warm and dry. Color is normal.  HEENT is unremarkable. Normocephalic/atraumatic. PERRL. Sclera are nonicteric. Neck is supple. No masses. No JVD. Lungs are clear. Cardiac exam shows a regular rate and rhythm. Abdomen is soft. Extremities reveal 1-2+ edema. Gait and ROM are intact. No gross neurologic deficits noted.   LABORATORY DATA:     Assessment / Plan:

## 2011-08-21 NOTE — Patient Instructions (Addendum)
Continue your current therapy.  I will see you again in 3 months with blood work- bmet,hfp,tsh.  We will check your pacemaker today.  You may increase your prilosec to 40 mg daily.

## 2011-08-21 NOTE — Progress Notes (Signed)
Patient seen with Dr Swaziland for pacemaker and wound check.  See PaceArt report for full details.  Rate response turned on today because of high percentage of atrial pacing and lack of energy per patient.  No other changes made.  Will see in three months with Dr Johney Frame.  Gypsy Balsam, RN, BSN 08/21/2011 3:46 PM

## 2011-08-23 ENCOUNTER — Ambulatory Visit (INDEPENDENT_AMBULATORY_CARE_PROVIDER_SITE_OTHER)
Admission: RE | Admit: 2011-08-23 | Discharge: 2011-08-23 | Disposition: A | Discharge status: Home / Self Care | Payer: Medicare Other | Source: Ambulatory Visit | Attending: Critical Care Medicine | Admitting: Critical Care Medicine

## 2011-08-23 ENCOUNTER — Ambulatory Visit (INDEPENDENT_AMBULATORY_CARE_PROVIDER_SITE_OTHER): Payer: Medicare Other | Admitting: Critical Care Medicine

## 2011-08-23 ENCOUNTER — Encounter: Payer: Self-pay | Admitting: Critical Care Medicine

## 2011-08-23 VITALS — BP 110/58 | HR 65 | Temp 97.9°F | Ht 62.0 in | Wt 157.6 lb

## 2011-08-23 DIAGNOSIS — R0609 Other forms of dyspnea: Secondary | ICD-10-CM

## 2011-08-23 DIAGNOSIS — R0600 Dyspnea, unspecified: Secondary | ICD-10-CM

## 2011-08-23 NOTE — Progress Notes (Signed)
Subjective:    Patient ID: [name], female    DOB: 1924/10/02, 77 y.o.   MRN: [id number]  HPI 77 y.o.F Seen in post hosp f/u for PNA.   Pt in hosp 10/26- 11/5  For atrial fib, acute on chronic Diastolic CHF Pulmonary edema, resp failure with hypoxemia.   Pt notes still with dyspnea, not very active.  Still having heartburn.  No real cough.   No chest pain. Now has PPM.  On pradaxa. Still on pacerone.  No real dysphagia. Notes some edema.  Non smoker. No pfts yet.  Sleeps on reg pillow.  No qhs dyspnea. No real wheeze.  PNA one year ago in hosp.   Past Medical History  Diagnosis Date  . Pneumonia 04/2010  . Hypertension   . Renal artery stenosis 2008    STATUS POST ANGIOPLASTY  . GERD (gastroesophageal reflux disease)   . Back pain     CHRONIC  . Atrial fibrillation     Prior TEE/CV in May 2012  . Hyponatremia     improved  . Seizure disorder     on anti-epileptic therapy  . Urinary incontinence   . Osteopenia   . S/P partial hysterectomy   . SOB (shortness of breath)   . SIADH (syndrome of inappropriate ADH production)     h/o HYPONATREMIA ; LIKELY RELATED TO HER LUNG PROCESS  . Arthritis   . Hiatal hernia   . Dizziness   . Joint pain   . Hiatal hernia   . Tachy-brady syndrome     with pauses noted in May 2012  . Dysphagia      Family History  Problem Relation Age of Onset  . Stroke Mother   . Diabetes Mother   . Heart disease Mother   . Stroke Father   . Diabetes Father   . Cancer Father     PROSTATE  . Cancer Brother     RENAL AND BLADDER  . Lung disease Brother   . Lung disease Sister   . Hypertension Daughter   . Hypertension Son      History   Social History  . Marital Status: Married    Spouse Name: N/A    Number of Children: 3  . Years of Education: 10   Occupational History  . [employer]    1980   Social History Main Topics  . Smoking status: Never Smoker   . Smokeless tobacco: Never Used  . Alcohol Use: No  .  Drug Use: No  . Sexually Active:    Other Topics Concern  . 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 amiodarone (PACERONE) 200 MG tablet Take 1 tablet (200 mg total) by mouth daily.  90 tablet  3  . amLODipine-olmesartan (AZOR) 10-40 MG per tablet Take 1 tablet by mouth daily.        . Calcium Citrate (CITRACAL PO) Take 2 tablets by mouth daily.       . cholecalciferol (VITAMIN D) 1000 UNITS tablet Take 1,000 Units by mouth daily.        . dabigatran (PRADAXA) 150 MG CAPS Take 150 mg by mouth every 12 (twelve) hours.        . divalproex (DEPAKOTE) 500 MG EC  tablet Take 500 mg by mouth at bedtime.        . furosemide (LASIX) 40 MG tablet Take 1 tablet (40 mg total) by mouth daily.  30 tablet  11  . HYDROcodone-acetaminophen (VICODIN) 5-500 MG per tablet Take 1 tablet by mouth every 6 (six) hours as needed. For pain       . lisinopril (PRINIVIL,ZESTRIL) 40 MG tablet Take 40 mg by mouth 2 (two) times daily.        . methocarbamol (ROBAXIN) 500 MG tablet Take 500 mg by mouth daily as needed. For pain      . metoprolol tartrate (LOPRESSOR) 25 MG tablet Take 25 mg by mouth 2 (two) times daily.        . Multiple Vitamins-Minerals (MULTIVITAMINS THER. W/MINERALS) TABS Take 1 tablet by mouth daily.        Marland Kitchen omeprazole (PRILOSEC) 40 MG capsule Take 40 mg by mouth daily.        . polyethylene glycol powder (GLYCOLAX/MIRALAX) powder Take 8 g by mouth daily as needed. For constipation      . timolol (BETIMOL) 0.5 % ophthalmic solution Place 1 drop into both eyes 2 (two) times daily.       . travoprost, benzalkonium, (TRAVATAN) 0.004 % ophthalmic solution Place 1 drop into both eyes at bedtime.          Review of Systems Constitutional:   No  weight loss,  night sweats,  Fevers, chills, fatigue, lassitude. HEENT:   No headaches,  Difficulty swallowing,  Tooth/dental problems,  Sore throat,                No sneezing, itching, ear ache, nasal congestion, post nasal drip,   CV:  No chest pain,  Orthopnea, PND, swelling in lower extremities, anasarca, dizziness, palpitations  GI  Notes  Heartburn,notes  Indigestion,  No  abdominal pain, nausea, vomiting, diarrhea, change in bowel habits, loss of appetite  Resp: Notes  shortness of breath with exertion not at rest.  No excess mucus, no productive cough,  No non-productive cough,  No coughing up of blood.  No change in color of mucus.  No wheezing.  No chest wall deformity  Skin: no rash or lesions.  GU: no dysuria, change in color of urine, no urgency or frequency.  No flank pain.  MS:  No joint pain or swelling.  No decreased range of motion.  No back pain.  Psych:  No change in mood or affect. No depression or anxiety.  No memory loss.     Objective:   Physical Exam  Filed Vitals:   08/23/11 0909  BP: 110/58  Pulse: 65  Temp: 97.9 F (36.6 C)  TempSrc: Oral  Height: 5\' 2"  (1.575 m)  Weight: 157 lb 9.6 oz (71.487 kg)  SpO2: 93%    Gen: Pleasant, well-nourished, in no distress,  normal affect  ENT: No lesions,  mouth clear,  oropharynx clear, no postnasal drip  Neck: No JVD, no TMG, no carotid bruits  Lungs: No use of accessory muscles, no dullness to percussion,distant BS  Cardiovascular: RRR, heart sounds normal, no murmur or gallops, no peripheral edema  Abdomen: soft and NT, no HSM,  BS normal  Musculoskeletal: No deformities, no cyanosis or clubbing  Neuro: alert, non focal  Skin: Warm, no lesions or rashes   Dg Chest 2 View  08/23/2011  *RADIOLOGY REPORT*  Clinical Data: Dyspnea.Shortness of breath.  Nonsmoker.  CHEST - 2 VIEW  Comparison: 07/24/2011 and  02/06/2011.  Findings: Slight improved aeration left lung.  Pulmonary vascular congestion most notable  centrally superimposed on chronic changes.  1.2 cm nodular density right lower lobe.  PA view with nipple marker in place recommended.  Sequential pacemaker enters from the left with leads unchanged in the region of the right atrium and right ventricle.  Thoracic compression deformities stable.  Calcified aorta.  IMPRESSION: Slight improved aeration left lung.  Pulmonary vascular congestion most notable centrally superimposed on chronic changes.  1.2 cm nodular density right lower lobe.  PA view with nipple marker in place recommended.  Original Report Authenticated By: Fuller Canada, M.D.   \     Assessment & Plan:   Dyspnea Dyspnea likely due to diastolic CHF with pulm edema, now improved . I doubt any primary lung disease. Note nodule in LL on CXR Plan Full pfts Rx GERD with PPI    Will likely need CT imaging for RLL nodule, will start with CXR with nipple marker  Updated Medication List Outpatient Encounter Prescriptions as of 08/23/2011  Medication Sig Dispense Refill  . amiodarone (PACERONE) 200 MG tablet Take 1 tablet (200 mg total) by mouth daily.  90 tablet  3  . amLODipine-olmesartan (AZOR) 10-40 MG per tablet Take 1 tablet by mouth daily.        . Calcium Citrate (CITRACAL PO) Take 2 tablets by mouth daily.       . cholecalciferol (VITAMIN D) 1000 UNITS tablet Take 1,000 Units by mouth daily.        . dabigatran (PRADAXA) 150 MG CAPS Take 150 mg by mouth every 12 (twelve) hours.        . divalproex (DEPAKOTE) 500 MG EC tablet Take 500 mg by mouth at bedtime.        . furosemide (LASIX) 40 MG tablet Take 1 tablet (40 mg total) by mouth daily.  30 tablet  11  . HYDROcodone-acetaminophen (VICODIN) 5-500 MG per tablet Take 1 tablet by mouth every 6 (six) hours as needed. For pain       . lisinopril (PRINIVIL,ZESTRIL) 40 MG tablet Take 40 mg by mouth 2 (two) times daily.        . methocarbamol (ROBAXIN) 500 MG tablet Take 500 mg by mouth daily as needed. For pain      .  metoprolol tartrate (LOPRESSOR) 25 MG tablet Take 25 mg by mouth 2 (two) times daily.        . Multiple Vitamins-Minerals (MULTIVITAMINS THER. W/MINERALS) TABS Take 1 tablet by mouth daily.        Marland Kitchen omeprazole (PRILOSEC) 40 MG capsule Take 40 mg by mouth daily.        . polyethylene glycol powder (GLYCOLAX/MIRALAX) powder Take 8 g by mouth daily as needed. For constipation      . timolol (BETIMOL) 0.5 % ophthalmic solution Place 1 drop into both eyes 2 (two) times daily.       . travoprost, benzalkonium, (TRAVATAN) 0.004 % ophthalmic solution Place 1 drop into both eyes at bedtime.

## 2011-08-23 NOTE — Patient Instructions (Addendum)
Pulmonary function testing will be obtained A chest xray will be obtained No change in medications Return after testing on lungs completed  Take omeprazole 1/2 hour before meal Follow reflux diet

## 2011-08-23 NOTE — Assessment & Plan Note (Signed)
Dyspnea likely due to diastolic CHF with pulm edema, now improved . I doubt any primary lung disease. Note nodule in LL on CXR Plan Full pfts Rx GERD with PPI

## 2011-08-24 ENCOUNTER — Other Ambulatory Visit: Payer: Self-pay | Admitting: Critical Care Medicine

## 2011-08-24 ENCOUNTER — Ambulatory Visit (INDEPENDENT_AMBULATORY_CARE_PROVIDER_SITE_OTHER)
Admission: RE | Admit: 2011-08-24 | Discharge: 2011-08-24 | Disposition: A | Discharge status: Home / Self Care | Payer: Medicare Other | Source: Ambulatory Visit | Attending: Critical Care Medicine | Admitting: Critical Care Medicine

## 2011-08-24 DIAGNOSIS — R0602 Shortness of breath: Secondary | ICD-10-CM

## 2011-08-24 NOTE — Progress Notes (Signed)
Quick Note:  Notify the patient that the Xray is stable and no cancer . Nodule reported yesterday is scar tissue. No change in medications are recommended. Continue current meds as prescribed at last office visit ______

## 2011-09-04 ENCOUNTER — Telehealth: Payer: Self-pay | Admitting: Cardiology

## 2011-09-04 NOTE — Telephone Encounter (Signed)
Called stating she has been very SOB for past few days. Slight swelling in ankles but can't tell if any worse than it has been. No change in weight. Taking Lasix 40 mg daily. Per Dr. Swaziland may increase Lasix to 40 mg BID x 3 days. If not any better to call us back. She verbalizes understanding.

## 2011-09-04 NOTE — Telephone Encounter (Signed)
Pt calling re SOB getting worse 2-3 days, pls call

## 2011-09-06 ENCOUNTER — Telehealth: Payer: Self-pay | Admitting: Cardiology

## 2011-09-06 NOTE — Telephone Encounter (Signed)
Pradaxa 75mg , Metoprolol 25mg  and Furosimide 40mg  90 day supply needs written rx , will pick up

## 2011-09-07 ENCOUNTER — Other Ambulatory Visit: Payer: Self-pay | Admitting: *Deleted

## 2011-09-07 MED ORDER — FUROSEMIDE 40 MG PO TABS: 40.0000 mg | ORAL_TABLET | Freq: Two times a day (BID) | ORAL | Status: DC

## 2011-09-07 MED ORDER — METOPROLOL TARTRATE 25 MG PO TABS: 25.0000 mg | ORAL_TABLET | Freq: Two times a day (BID) | ORAL | Status: DC

## 2011-09-07 MED ORDER — DABIGATRAN ETEXILATE MESYLATE 150 MG PO CAPS: 150.0000 mg | ORAL_CAPSULE | Freq: Two times a day (BID) | ORAL | Status: DC

## 2011-09-07 NOTE — Telephone Encounter (Signed)
Called stating she is not feeling any better. Is still "very" short of breath. Still no change in wt. Per Dr. Swaziland advised to stop Amiodarone and continue taking Lasix 40 mg BID. Will continue to monitor her weight. Will call us if does not feel any better.

## 2011-09-07 NOTE — Telephone Encounter (Signed)
Fu call  Pt was calling back to twell you how she was doing

## 2011-09-08 ENCOUNTER — Encounter: Payer: Self-pay | Admitting: Internal Medicine

## 2011-09-13 ENCOUNTER — Inpatient Hospital Stay (HOSPITAL_COMMUNITY)
Admission: EM | Admit: 2011-09-13 | Discharge: 2011-09-21 | DRG: 291 | Disposition: A | Discharge status: Home Health | Payer: Medicare Other | Attending: Cardiology | Admitting: Cardiology

## 2011-09-13 ENCOUNTER — Encounter (HOSPITAL_COMMUNITY): Payer: Self-pay | Admitting: Emergency Medicine

## 2011-09-13 ENCOUNTER — Emergency Department (HOSPITAL_COMMUNITY): Payer: Medicare Other

## 2011-09-13 ENCOUNTER — Other Ambulatory Visit: Payer: Self-pay

## 2011-09-13 DIAGNOSIS — E871 Hypo-osmolality and hyponatremia: Secondary | ICD-10-CM | POA: Diagnosis not present

## 2011-09-13 DIAGNOSIS — M129 Arthropathy, unspecified: Secondary | ICD-10-CM | POA: Diagnosis present

## 2011-09-13 DIAGNOSIS — I10 Essential (primary) hypertension: Secondary | ICD-10-CM | POA: Diagnosis present

## 2011-09-13 DIAGNOSIS — G40909 Epilepsy, unspecified, not intractable, without status epilepticus: Secondary | ICD-10-CM | POA: Diagnosis present

## 2011-09-13 DIAGNOSIS — E876 Hypokalemia: Secondary | ICD-10-CM | POA: Diagnosis present

## 2011-09-13 DIAGNOSIS — Z79899 Other long term (current) drug therapy: Secondary | ICD-10-CM

## 2011-09-13 DIAGNOSIS — R0989 Other specified symptoms and signs involving the circulatory and respiratory systems: Secondary | ICD-10-CM | POA: Diagnosis not present

## 2011-09-13 DIAGNOSIS — I4891 Unspecified atrial fibrillation: Secondary | ICD-10-CM | POA: Diagnosis present

## 2011-09-13 DIAGNOSIS — I5032 Chronic diastolic (congestive) heart failure: Secondary | ICD-10-CM | POA: Insufficient documentation

## 2011-09-13 DIAGNOSIS — Z96659 Presence of unspecified artificial knee joint: Secondary | ICD-10-CM

## 2011-09-13 DIAGNOSIS — J189 Pneumonia, unspecified organism: Secondary | ICD-10-CM | POA: Diagnosis present

## 2011-09-13 DIAGNOSIS — K219 Gastro-esophageal reflux disease without esophagitis: Secondary | ICD-10-CM | POA: Diagnosis present

## 2011-09-13 DIAGNOSIS — Z950 Presence of cardiac pacemaker: Secondary | ICD-10-CM | POA: Diagnosis not present

## 2011-09-13 DIAGNOSIS — Z9849 Cataract extraction status, unspecified eye: Secondary | ICD-10-CM

## 2011-09-13 DIAGNOSIS — R0600 Dyspnea, unspecified: Secondary | ICD-10-CM

## 2011-09-13 DIAGNOSIS — I5033 Acute on chronic diastolic (congestive) heart failure: Principal | ICD-10-CM | POA: Diagnosis present

## 2011-09-13 DIAGNOSIS — R0602 Shortness of breath: Secondary | ICD-10-CM | POA: Diagnosis not present

## 2011-09-13 DIAGNOSIS — T451X5A Adverse effect of antineoplastic and immunosuppressive drugs, initial encounter: Secondary | ICD-10-CM | POA: Diagnosis present

## 2011-09-13 DIAGNOSIS — J111 Influenza due to unidentified influenza virus with other respiratory manifestations: Secondary | ICD-10-CM | POA: Diagnosis present

## 2011-09-13 DIAGNOSIS — I440 Atrioventricular block, first degree: Secondary | ICD-10-CM | POA: Diagnosis present

## 2011-09-13 DIAGNOSIS — J101 Influenza due to other identified influenza virus with other respiratory manifestations: Secondary | ICD-10-CM | POA: Diagnosis not present

## 2011-09-13 DIAGNOSIS — I509 Heart failure, unspecified: Secondary | ICD-10-CM | POA: Diagnosis present

## 2011-09-13 DIAGNOSIS — R1310 Dysphagia, unspecified: Secondary | ICD-10-CM | POA: Diagnosis present

## 2011-09-13 LAB — CBC
HCT: 36.5 % (ref 36.0–46.0)
MCH: 28.8 pg (ref 26.0–34.0)
MCHC: 33.7 g/dL (ref 30.0–36.0)
MCV: 85.5 fL (ref 78.0–100.0)
MCV: 86.4 fL (ref 78.0–100.0)
Platelets: 219 10*3/uL (ref 150–400)
RBC: 4.25 MIL/uL (ref 3.87–5.11)
RDW: 17 % — ABNORMAL HIGH (ref 11.5–15.5)
WBC: 9 10*3/uL (ref 4.0–10.5)

## 2011-09-13 LAB — DIFFERENTIAL
Basophils Absolute: 0.1 10*3/uL (ref 0.0–0.1)
Basophils Relative: 1 % (ref 0–1)
Eosinophils Relative: 1 % (ref 0–5)
Monocytes Absolute: 1.4 10*3/uL — ABNORMAL HIGH (ref 0.1–1.0)
Monocytes Relative: 13 % — ABNORMAL HIGH (ref 3–12)

## 2011-09-13 LAB — CARDIAC PANEL(CRET KIN+CKTOT+MB+TROPI)
CK, MB: 2.8 ng/mL (ref 0.3–4.0)
Total CK: 55 U/L (ref 7–177)
Total CK: 49 U/L (ref 7–177)
Troponin I: <0.3 ng/mL (ref <0.30)

## 2011-09-13 LAB — CREATININE, SERUM
Creatinine, Ser: 0.89 mg/dL (ref 0.50–1.10)
GFR calc Af Amer: 67 mL/min — ABNORMAL LOW (ref >90)
GFR calc non Af Amer: 58 mL/min — ABNORMAL LOW (ref >90)

## 2011-09-13 LAB — COMPREHENSIVE METABOLIC PANEL
AST: 25 U/L (ref 0–37)
Albumin: 2.9 g/dL — ABNORMAL LOW (ref 3.5–5.2)
BUN: 12 mg/dL (ref 6–23)
Calcium: 9.5 mg/dL (ref 8.4–10.5)
Creatinine, Ser: 0.9 mg/dL (ref 0.50–1.10)
GFR calc non Af Amer: 57 mL/min — ABNORMAL LOW (ref >90)

## 2011-09-13 LAB — PRO B NATRIURETIC PEPTIDE: Pro B Natriuretic peptide (BNP): 4661 pg/mL — ABNORMAL HIGH (ref 0–450)

## 2011-09-13 MED ORDER — FUROSEMIDE 10 MG/ML IJ SOLN
80.0000 mg | Freq: Once | INTRAMUSCULAR | Status: AC
  Administered 2011-09-13: 80 mg via INTRAVENOUS

## 2011-09-13 MED ORDER — SODIUM CHLORIDE 0.9 % IJ SOLN: 3.0000 mL | INTRAMUSCULAR | Status: DC | PRN

## 2011-09-13 MED ORDER — DABIGATRAN ETEXILATE MESYLATE 75 MG PO CAPS
75.0000 mg | ORAL_CAPSULE | Freq: Two times a day (BID) | ORAL | Status: DC
  Administered 2011-09-13 – 2011-09-21 (×16): 75 mg via ORAL
  Filled 2011-09-13 (×17): qty 1

## 2011-09-13 MED ORDER — SODIUM CHLORIDE 0.9 % IJ SOLN
3.0000 mL | Freq: Two times a day (BID) | INTRAMUSCULAR | Status: DC
  Administered 2011-09-13 – 2011-09-18 (×10): 3 mL via INTRAVENOUS
  Administered 2011-09-18: 10:00:00 via INTRAVENOUS
  Administered 2011-09-19 – 2011-09-21 (×5): 3 mL via INTRAVENOUS

## 2011-09-13 MED ORDER — FUROSEMIDE 10 MG/ML IJ SOLN
INTRAMUSCULAR | Status: AC
  Filled 2011-09-13: qty 4

## 2011-09-13 MED ORDER — AMLODIPINE-OLMESARTAN 10-40 MG PO TABS: 1.0000 | ORAL_TABLET | Freq: Every day | ORAL | Status: DC

## 2011-09-13 MED ORDER — ZOLPIDEM TARTRATE 5 MG PO TABS: 5.0000 mg | ORAL_TABLET | Freq: Every evening | ORAL | Status: DC | PRN

## 2011-09-13 MED ORDER — ONDANSETRON HCL 4 MG/2ML IJ SOLN: 4.0000 mg | Freq: Four times a day (QID) | INTRAMUSCULAR | Status: DC | PRN

## 2011-09-13 MED ORDER — ACETAMINOPHEN 325 MG PO TABS
650.0000 mg | ORAL_TABLET | ORAL | Status: DC | PRN
  Administered 2011-09-16: 650 mg via ORAL
  Filled 2011-09-13: qty 2

## 2011-09-13 MED ORDER — METOPROLOL TARTRATE 25 MG PO TABS
25.0000 mg | ORAL_TABLET | Freq: Two times a day (BID) | ORAL | Status: DC
  Administered 2011-09-13 – 2011-09-20 (×15): 25 mg via ORAL
  Filled 2011-09-13 (×17): qty 1

## 2011-09-13 MED ORDER — TIMOLOL HEMIHYDRATE 0.5 % OP SOLN
1.0000 [drp] | Freq: Two times a day (BID) | OPHTHALMIC | Status: DC
  Administered 2011-09-13 – 2011-09-14 (×2): 1 [drp] via OPHTHALMIC
  Filled 2011-09-13 (×7): qty 0.1

## 2011-09-13 MED ORDER — ALPRAZOLAM 0.25 MG PO TABS: 0.2500 mg | ORAL_TABLET | Freq: Every evening | ORAL | Status: DC | PRN

## 2011-09-13 MED ORDER — AMLODIPINE BESYLATE 10 MG PO TABS
10.0000 mg | ORAL_TABLET | Freq: Every day | ORAL | Status: DC
  Administered 2011-09-13 – 2011-09-20 (×8): 10 mg via ORAL
  Filled 2011-09-13 (×9): qty 1

## 2011-09-13 MED ORDER — SODIUM CHLORIDE 0.9 % IV SOLN: 250.0000 mL | INTRAVENOUS | Status: DC | PRN

## 2011-09-13 MED ORDER — DIVALPROEX SODIUM 500 MG PO DR TAB
500.0000 mg | DELAYED_RELEASE_TABLET | Freq: Every day | ORAL | Status: DC
  Administered 2011-09-13 – 2011-09-20 (×8): 500 mg via ORAL
  Filled 2011-09-13 (×9): qty 1

## 2011-09-13 MED ORDER — HYDROCODONE-ACETAMINOPHEN 5-325 MG PO TABS
1.0000 | ORAL_TABLET | Freq: Four times a day (QID) | ORAL | Status: DC | PRN
  Filled 2011-09-13: qty 1

## 2011-09-13 MED ORDER — LISINOPRIL 40 MG PO TABS
40.0000 mg | ORAL_TABLET | Freq: Two times a day (BID) | ORAL | Status: DC
  Administered 2011-09-13 – 2011-09-18 (×9): 40 mg via ORAL
  Filled 2011-09-13 (×12): qty 1

## 2011-09-13 MED ORDER — OLMESARTAN MEDOXOMIL 40 MG PO TABS
40.0000 mg | ORAL_TABLET | Freq: Every day | ORAL | Status: DC
  Administered 2011-09-13 – 2011-09-20 (×8): 40 mg via ORAL
  Filled 2011-09-13 (×9): qty 1

## 2011-09-13 MED ORDER — SODIUM CHLORIDE 0.9 % IV SOLN: Freq: Once | INTRAVENOUS | Status: DC

## 2011-09-13 MED ORDER — FUROSEMIDE 10 MG/ML IJ SOLN
80.0000 mg | Freq: Two times a day (BID) | INTRAMUSCULAR | Status: DC
  Administered 2011-09-14 – 2011-09-16 (×5): 80 mg via INTRAVENOUS
  Filled 2011-09-13 (×8): qty 8

## 2011-09-13 MED ORDER — THERA M PLUS PO TABS
1.0000 | ORAL_TABLET | Freq: Every day | ORAL | Status: DC
  Administered 2011-09-13 – 2011-09-21 (×9): 1 via ORAL
  Filled 2011-09-13 (×9): qty 1

## 2011-09-13 MED ORDER — POLYETHYLENE GLYCOL 3350 17 GM/SCOOP PO POWD
8.0000 g | Freq: Every day | ORAL | Status: DC | PRN
  Filled 2011-09-13: qty 255

## 2011-09-13 MED ORDER — PANTOPRAZOLE SODIUM 40 MG PO TBEC
80.0000 mg | DELAYED_RELEASE_TABLET | Freq: Every day | ORAL | Status: DC
  Administered 2011-09-13 – 2011-09-21 (×9): 80 mg via ORAL
  Filled 2011-09-13: qty 2
  Filled 2011-09-13 (×5): qty 1
  Filled 2011-09-13: qty 2
  Filled 2011-09-13 (×3): qty 1
  Filled 2011-09-13 (×2): qty 2

## 2011-09-13 MED ORDER — POTASSIUM CHLORIDE CRYS ER 10 MEQ PO TBCR
10.0000 meq | EXTENDED_RELEASE_TABLET | Freq: Two times a day (BID) | ORAL | Status: DC
  Administered 2011-09-13 – 2011-09-21 (×17): 10 meq via ORAL
  Filled 2011-09-13 (×18): qty 1

## 2011-09-13 MED ORDER — METHOCARBAMOL 500 MG PO TABS
500.0000 mg | ORAL_TABLET | Freq: Two times a day (BID) | ORAL | Status: DC | PRN
  Filled 2011-09-13: qty 1

## 2011-09-13 MED ORDER — NITROGLYCERIN IN D5W 200-5 MCG/ML-% IV SOLN
5.0000 ug/min | INTRAVENOUS | Status: DC
  Administered 2011-09-13: 5 ug/min via INTRAVENOUS
  Filled 2011-09-13: qty 250

## 2011-09-13 MED ORDER — VITAMIN D3 25 MCG (1000 UNIT) PO TABS
1000.0000 [IU] | ORAL_TABLET | Freq: Every day | ORAL | Status: DC
  Administered 2011-09-13 – 2011-09-21 (×10): 1000 [IU] via ORAL
  Filled 2011-09-13 (×9): qty 1

## 2011-09-13 MED ORDER — TRAVOPROST 0.004 % OP SOLN
1.0000 [drp] | Freq: Every day | OPHTHALMIC | Status: DC
  Administered 2011-09-13 – 2011-09-20 (×8): 1 [drp] via OPHTHALMIC
  Filled 2011-09-13 (×4): qty 0.1

## 2011-09-13 NOTE — ED Notes (Signed)
Heart healthy diet /low sodium ordered for pt.

## 2011-09-13 NOTE — ED Provider Notes (Signed)
History     CSN: [id number]  Arrival date & time 09/13/11  1354   First MD Initiated Contact with Patient 09/13/11 1415      Chief Complaint  Patient presents with  . Shortness of Breath    (Consider location/radiation/quality/duration/timing/severity/associated sxs/prior treatment) Patient is a 77 y.o. female presenting with shortness of breath. The history is provided by the patient.  Shortness of Breath  Associated symptoms include shortness of breath.   patient here with shortness of breath times several weeks. Worse for the last 24 hours. Notes dyspnea on exertion as well as orthopnea. Cough has been nonproductive. No fever noted. Denies any severe chest pain or chest pressure. Denies any diaphoresis. Patient takes Lasix and her doses have been changed recently. Does have a history of A. fib. Patient also notes worsening edema. Nothing makes her symptoms better  Past Medical History  Diagnosis Date  . Pneumonia 04/2010  . Hypertension   . Renal artery stenosis 2008    STATUS POST ANGIOPLASTY  . GERD (gastroesophageal reflux disease)   . Back pain     CHRONIC  . Atrial fibrillation     Prior TEE/CV in May 2012  . Hyponatremia     improved  . Seizure disorder     on anti-epileptic therapy  . Urinary incontinence   . Osteopenia   . S/P partial hysterectomy   . SOB (shortness of breath)   . SIADH (syndrome of inappropriate ADH production)     h/o HYPONATREMIA ; LIKELY RELATED TO HER LUNG PROCESS  . Arthritis   . Hiatal hernia   . Dizziness   . Joint pain   . Hiatal hernia   . Tachy-brady syndrome     with pauses noted in May 2012  . Dysphagia     Past Surgical History  Procedure Date  . Cholecystectomy   . Back surgery     X2  . Total knee arthroplasty     RIGHT AND LEFT KNEE  . Tonsillectomy   . Appendectomy   . Cardiac catheterization 2007    Normal  . Knee arthroscopy     LEFT KNEE  . Cataract extraction   . Shoulder surgery   . Renal angioplasty  01/2007    RIGHT  . Tee with cv 01/2011  . Partial hysterectomy     Family History  Problem Relation Age of Onset  . Stroke Mother   . Diabetes Mother   . Heart disease Mother   . Stroke Father   . Diabetes Father   . Cancer Father     PROSTATE  . Cancer Brother     RENAL AND BLADDER  . Lung disease Brother   . Lung disease Sister   . Hypertension Daughter   . Hypertension Son     History  Substance Use Topics  . Smoking status: Never Smoker   . Smokeless tobacco: Never Used  . Alcohol Use: No    OB History    Grav Para Term Preterm Abortions TAB SAB Ect Mult Living                  Review of Systems  Respiratory: Positive for shortness of breath.   All other systems reviewed and are negative.    Allergies  Review of patient's allergies indicates no known allergies.  Home Medications   Current Outpatient Rx  Name Route Sig Dispense Refill  . AMIODARONE HCL 200 MG PO TABS Oral Take 200 mg by mouth daily.      Marland Kitchen  AMLODIPINE-OLMESARTAN 10-40 MG PO TABS Oral Take 1 tablet by mouth daily.      Marland Kitchen CITRACAL PO Oral Take 2 tablets by mouth daily.     Marland Kitchen VITAMIN D 1000 UNITS PO TABS Oral Take 1,000 Units by mouth daily.      Marland Kitchen DABIGATRAN ETEXILATE MESYLATE 75 MG PO CAPS Oral Take 75 mg by mouth every 12 (twelve) hours.      Marland Kitchen DIVALPROEX SODIUM 500 MG PO TBEC Oral Take 500 mg by mouth at bedtime.      . FUROSEMIDE 40 MG PO TABS Oral Take 1 tablet (40 mg total) by mouth 2 (two) times daily. 180 tablet 3  . HYDROCODONE-ACETAMINOPHEN 5-500 MG PO TABS Oral Take 1 tablet by mouth every 6 (six) hours as needed. For pain     . LISINOPRIL 40 MG PO TABS Oral Take 40 mg by mouth 2 (two) times daily.      Marland Kitchen METHOCARBAMOL 500 MG PO TABS Oral Take 500 mg by mouth 2 (two) times daily as needed. For pain    . METOPROLOL TARTRATE 25 MG PO TABS Oral Take 1 tablet (25 mg total) by mouth 2 (two) times daily. 180 tablet 3  . THERA M PLUS PO TABS Oral Take 1 tablet by mouth daily.      Marland Kitchen  OMEPRAZOLE 40 MG PO CPDR Oral Take 40 mg by mouth daily.      Marland Kitchen POLYETHYLENE GLYCOL 3350 PO POWD Oral Take 8 g by mouth daily as needed. For constipation    . TIMOLOL HEMIHYDRATE 0.5 % OP SOLN Both Eyes Place 1 drop into both eyes 2 (two) times daily.     . TRAVOPROST 0.004 % OP SOLN Left Eye Place 1 drop into the left eye at bedtime.       BP 146/75  Pulse 69  Temp(Src) 98.2 F (36.8 C) (Oral)  Resp 17  Ht 5\' 3"  (1.6 m)  Wt 160 lb (72.576 kg)  BMI 28.34 kg/m2  SpO2 92%  Physical Exam  Nursing note and vitals reviewed. Constitutional: She is oriented to person, place, and time. She appears well-developed and well-nourished.  Non-toxic appearance. No distress.  HENT:  Head: Normocephalic and atraumatic.  Eyes: Conjunctivae, EOM and lids are normal. Pupils are equal, round, and reactive to light.  Neck: Normal range of motion. Neck supple. No tracheal deviation present. No mass present.  Cardiovascular: Normal rate, regular rhythm and normal heart sounds.  Exam reveals no gallop.   No murmur heard. Pulmonary/Chest: Effort normal and breath sounds normal. No stridor. No respiratory distress. She has no decreased breath sounds. She has no wheezes. She has no rhonchi. She has no rales.  Abdominal: Soft. Normal appearance and bowel sounds are normal. She exhibits no distension. There is no tenderness. There is no rebound and no CVA tenderness.  Musculoskeletal: Normal range of motion. She exhibits no edema and no tenderness.       Feet:  Neurological: She is alert and oriented to person, place, and time. She has normal strength. No cranial nerve deficit or sensory deficit. GCS eye subscore is 4. GCS verbal subscore is 5. GCS motor subscore is 6.  Skin: Skin is warm and dry. No abrasion and no rash noted.  Psychiatric: She has a normal mood and affect. Her speech is normal and behavior is normal.    ED Course  Procedures (including critical care time)   Labs Reviewed  CBC    DIFFERENTIAL  COMPREHENSIVE METABOLIC PANEL  CARDIAC PANEL(CRET KIN+CKTOT+MB+TROPI)  PRO B NATRIURETIC PEPTIDE   Dg Chest Port 1 View  09/13/2011  *RADIOLOGY REPORT*  Clinical Data: Short of breath for 1 month.  Difficulty breathing today.  PORTABLE CHEST - 1 VIEW  Comparison: 08/24/2011.  Findings: Unchanged dual lead left subclavian cardiac pacemaker. Decreased lung volumes are present compared to prior.  There is bilateral perihilar and basilar predominant airspace disease compatible with pulmonary edema and CHF.  Cephalization of pulmonary blood flow is present with pulmonary vascular congestion. These changes are superimposed on chronic lung disease.  IMPRESSION: Moderate CHF.  Unchanged pacemaker apparatus.  Original Report Authenticated By: Andreas Newport, M.D.     No diagnosis found.    MDM   Date: 09/13/2011  Rate: 70  Rhythm: normal sinus rhythm  QRS Axis: normal  Intervals: normal  ST/T Wave abnormalities: normal  Conduction Disutrbances:first-degree A-V block   Narrative Interpretation:   Old EKG Reviewed: unchanged  Patient given Lasix 80 mg IV push here. Nitroglycerin drip started. Spoke with cardiology patient to be admitted          Toy Baker, MD 09/13/11 1458

## 2011-09-13 NOTE — Telephone Encounter (Signed)
New problem:  C/O sob today, a lot of swelling legs , ankles, feet. Increase her lasix. Stop taken amiodarone

## 2011-09-13 NOTE — ED Notes (Signed)
Portable xray at bedside.

## 2011-09-13 NOTE — ED Notes (Signed)
Pt c/o increased SOB and leg swelling x several weeks; pt unable to speak more than one word at present; pt O2 sats 65% on RA

## 2011-09-13 NOTE — ED Notes (Signed)
Cardiology at bedside.

## 2011-09-13 NOTE — ED Notes (Signed)
ekg given to dr. Allen. 

## 2011-09-13 NOTE — Telephone Encounter (Signed)
Husband called stating she is a lot worse. Has gained a lot of weight over past few days, feet and ankles are "very" swollen, and she can't even get out of bed without being SOB. Advised to take her to [facility]. He thinks that would be the best thing to do. Spoke w/Gesila and she will let triage person at hospital be aware that she will be coming.

## 2011-09-13 NOTE — ED Notes (Signed)
Family at bedside. 

## 2011-09-13 NOTE — H&P (Signed)
History and Physical   Patient ID: [name] MRN: [id number], DOB/AGE: 75-Jul-1926   Admit date: 09/13/2011 Date of Consult: 09/13/2011   Primary Physician: [name], MD, MD Primary Cardiologist: [place], Peter, MD  Pt. Profile: [name] is a 77yo female with PMHx significant for diastolic CHF (on Lopressor, Lisinopril, Lasix outpatient), s/p PPM implantation (10/12 for tachy-brady syndrome), atrial fibrillation (recently stopped Amiodarone last week, Pradaxa) and GERD.   Recently seen in the office on 12/03. Had recently been admitted post-PPM implantation (see above indication) for multifactorial acute respiratory failure secondary to diastolic CHF and aspiration 2/2 to dysphagia. Breathing was much improved at this office visit. Pulse was regular. Pt complained of some LE edema. Lungs were clear on exam, continued on Lasix 40 mg daily.   Problem List: Past Medical History  Diagnosis Date  . Pneumonia 04/2010  . Hypertension   . Renal artery stenosis 2008    STATUS POST ANGIOPLASTY  . GERD (gastroesophageal reflux disease)   . Back pain     CHRONIC  . Atrial fibrillation     Prior TEE/CV in May 2012  . Hyponatremia     improved  . Seizure disorder     on anti-epileptic therapy  . Urinary incontinence   . Osteopenia   . S/P partial hysterectomy   . SOB (shortness of breath)   . SIADH (syndrome of inappropriate ADH production)     h/o HYPONATREMIA ; LIKELY RELATED TO HER LUNG PROCESS  . Arthritis   . Hiatal hernia   . Dizziness   . Joint pain   . Hiatal hernia   . Tachy-brady syndrome     with pauses noted in May 2012  . Dysphagia     Past Surgical History  Procedure Date  . Cholecystectomy   . Back surgery     X2  . Total knee arthroplasty     RIGHT AND LEFT KNEE  . Tonsillectomy   . Appendectomy   . Cardiac catheterization 2007    Normal  . Knee arthroscopy     LEFT KNEE  . Cataract extraction   . Shoulder surgery   . Renal angioplasty 01/2007      RIGHT  . Tee with cv 01/2011  . Partial hysterectomy      Allergies: No Known Allergies  HPI:   Pt reports a one week history of worsening SOB, increased weight gain of approximately 10 lbs. She reports an uninhibited consumption of salty foods recently. She endorses 1 pillow orthopnea and PND at baseline. She reports abdominal fullness. She denies chest pain, palpitations, diaphoresis, n/v, fevers, chills, productive cough, bloody stool or diarrhea.   She called the [employer] office today with her complaints and was advised to present to [facility] ED. EKG revealed NSR without ST-T wave changes, possible old anteroseptal infarction. CXR revealed moderate pulmonary edema consistent with CHF superimposed on chronic lung changes. BNP elevated at >4000. BUN/Cr WNL. CE neg x 1.   Inpatient Medications:     . sodium chloride   Intravenous Once  . furosemide      . furosemide  80 mg Intravenous Once    (Not in a hospital admission)  Family History  Problem Relation Age of Onset  . Stroke Mother   . Diabetes Mother   . Heart disease Mother   . Stroke Father   . Diabetes Father   . Cancer Father     PROSTATE  . Cancer Brother     RENAL  AND BLADDER  . Lung disease Brother   . Lung disease Sister   . Hypertension Daughter   . Hypertension Son      History   Social History  . Marital Status: Married    Spouse Name: N/A    Number of Children: 3  . Years of Education: 10   Occupational History  . [employer]    1980   Social History Main Topics  . Smoking status: Never Smoker   . Smokeless tobacco: Never Used  . Alcohol Use: No  . Drug Use: No  . Sexually Active:    Other Topics Concern  . 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\NO SMOKING, OR DRUG USE     Review of Systems: General: negative for chills, fever, night sweats,  positive for weight change ~10 lbs increase Cardiovascular: negative for chest pain, palpitations, PND and SOB, positive for dyspnea on exertion, edema, orthopnea Dermatological: negative for rash Respiratory: negative for cough or wheezing Urologic: negative for hematuria Abdominal: negative for nausea, vomiting, diarrhea, bright red blood per rectum, melena, or hematemesis Neurologic: negative for visual changes, syncope, or dizziness All other systems reviewed and are otherwise negative except as noted above.  Physical Exam: Blood pressure 146/75, pulse 69, temperature 98.2 F (36.8 C), temperature source Oral, resp. rate 17, height 5\' 3"  (1.6 m), weight 72.576 kg (160 lb), SpO2 92.00%.  GENERAL:  Mild/moderate dyspnea HEENT:  Pupils equal round and reactive, fundi not visualized, oral mucosa unremarkable NECK:  JVD to to jaw at 90 degress.   Waveform within normal limits, carotid upstroke brisk and symmetric, no bruits, no thyromegaly LYMPHATICS:  No cervical, inguinal adenopathy LUNGS:  Clear to auscultation bilaterally BACK:  No CVA tenderness CHEST:  Unremarkable HEART:  PMI not displaced or sustained,S1 and S2 within normal limits, no S3, no S4, no clicks, no rubs, no murmurs ABD:  Flat, positive bowel sounds normal in frequency in pitch, no bruits, no rebound, no guarding, no midline pulsatile mass, no hepatomegaly, no splenomegaly EXT:  2 plus pulses throughout, moderate edema, no cyanosis no clubbing SKIN:  No rashes no nodules NEURO:  Cranial nerves II through XII grossly intact, motor grossly intact throughout PSYCH:  Cognitively intact, oriented to person place and time   Labs: Recent Labs  Basename 09/13/11 1438   WBC 10.9*   HGB 12.3   HCT 36.5   MCV 85.5   PLT 236   BMET    Component Value Date/Time   NA 132* 09/13/2011 1438   K 4.1 09/13/2011 1438   CL 91* 09/13/2011 1438   CO2 32 09/13/2011 1438   GLUCOSE 119* 09/13/2011 1438   BUN 12 09/13/2011 1438    CREATININE 0.90 09/13/2011 1438   CALCIUM 9.5 09/13/2011 1438   GFRNONAA 57* 09/13/2011 1438   GFRAA 66* 09/13/2011 1438   Results for LEVEDA, KENDRIX (MRN [id number]) as of 09/13/2011 16:09  Ref. Range 09/13/2011 14:42  CK, MB Latest Range: 0.3-4.0 ng/mL 2.8  CK Total Latest Range: 7-177 U/L 55  Troponin I Latest Range: <0.30 ng/mL <0.30  Pro B Natriuretic peptide (BNP) Latest Range: 0-450 pg/mL 4661.0 (H)     Radiology/Studies: Dg Chest 2 View  09/13/2011  *RADIOLOGY REPORT*  Clinical Data: Short of breath for 1 month.  Difficulty breathing today.  PORTABLE CHEST - 1 VIEW  Comparison: 08/24/2011.  Findings: Unchanged dual lead  left subclavian cardiac pacemaker. Decreased lung volumes are present compared to prior.  There is bilateral perihilar and basilar predominant airspace disease compatible with pulmonary edema and CHF.  Cephalization of pulmonary blood flow is present with pulmonary vascular congestion. These changes are superimposed on chronic lung disease.  IMPRESSION: Moderate CHF.  Unchanged pacemaker apparatus.  Original Report Authenticated By: Andreas Newport, M.D.   EKG: NSR, 70 bpm, 1st degree AV block, Q wave V1, V2 unchanged from previous EKG  ASSESSMENT AND PLAN:    Signed, R. Hurman Horn, PA-C 09/13/2011, 3:31 PM    History reviewed with the patient, no changes to be made.  She has had progressive dyspnea times 1 week that did not respond to increased lasix at home.  She has had a 10 lb weight gain in one week.  She might have had some increased salt.  She denies chest pain.  She came into the ER when the dyspnea became too severe.  I completed and entered the physical exam findings above.  All available labs, radiology testing, previous records reviewed. Agree with documented assessment and plan.  She will be admitted for IV diuresis.  Other meds at home will be continued.  Of note, her amiodarone was stopped recently as it was questioned whether this could be the cause  of her dyspnea.  She clearly is in CHF.  I think that this medication could be restarted at discharge as maintaining NSR is very important.  She alos needs continued education about salt restriction.  She probably needs a lower baseline BP at home as her sytolics seem to be above 140 usually.   Fayrene Fearing Jquan Egelston  4:11 PM 08/04/2011

## 2011-09-14 ENCOUNTER — Encounter (HOSPITAL_COMMUNITY): Payer: Self-pay | Admitting: *Deleted

## 2011-09-14 ENCOUNTER — Inpatient Hospital Stay (HOSPITAL_COMMUNITY): Payer: Medicare Other

## 2011-09-14 ENCOUNTER — Other Ambulatory Visit: Payer: Self-pay

## 2011-09-14 DIAGNOSIS — J189 Pneumonia, unspecified organism: Secondary | ICD-10-CM | POA: Diagnosis present

## 2011-09-14 DIAGNOSIS — I509 Heart failure, unspecified: Secondary | ICD-10-CM

## 2011-09-14 DIAGNOSIS — I5033 Acute on chronic diastolic (congestive) heart failure: Principal | ICD-10-CM

## 2011-09-14 LAB — CARDIAC PANEL(CRET KIN+CKTOT+MB+TROPI)
CK, MB: 1.9 ng/mL (ref 0.3–4.0)
Relative Index: INVALID (ref 0.0–2.5)
Relative Index: INVALID (ref 0.0–2.5)
Total CK: 37 U/L (ref 7–177)
Total CK: 36 U/L (ref 7–177)
Troponin I: <0.3 ng/mL (ref <0.30)
Troponin I: <0.3 ng/mL (ref <0.30)
Troponin I: <0.3 ng/mL (ref <0.30)

## 2011-09-14 LAB — BASIC METABOLIC PANEL
CO2: 32 mEq/L (ref 19–32)
Calcium: 9.2 mg/dL (ref 8.4–10.5)
GFR calc Af Amer: 74 mL/min — ABNORMAL LOW (ref >90)
GFR calc non Af Amer: 64 mL/min — ABNORMAL LOW (ref >90)
Sodium: 133 mEq/L — ABNORMAL LOW (ref 135–145)

## 2011-09-14 MED ORDER — FUROSEMIDE 10 MG/ML IJ SOLN
80.0000 mg | Freq: Once | INTRAMUSCULAR | Status: AC
  Administered 2011-09-14: 80 mg via INTRAVENOUS
  Filled 2011-09-14: qty 8

## 2011-09-14 MED ORDER — TIMOLOL MALEATE 0.5 % OP SOLN
1.0000 [drp] | Freq: Two times a day (BID) | OPHTHALMIC | Status: DC
  Administered 2011-09-14 – 2011-09-21 (×14): 1 [drp] via OPHTHALMIC
  Filled 2011-09-14: qty 5

## 2011-09-14 MED ORDER — DEXTROSE 5 % IV SOLN
1.0000 g | INTRAVENOUS | Status: DC
  Administered 2011-09-15 – 2011-09-18 (×4): 1 g via INTRAVENOUS
  Filled 2011-09-14 (×5): qty 10

## 2011-09-14 MED ORDER — CLINDAMYCIN PHOSPHATE 300 MG/50ML IV SOLN
300.0000 mg | Freq: Three times a day (TID) | INTRAVENOUS | Status: DC
  Administered 2011-09-15 – 2011-09-18 (×10): 300 mg via INTRAVENOUS
  Filled 2011-09-14 (×11): qty 50

## 2011-09-14 MED ORDER — DM-GUAIFENESIN ER 30-600 MG PO TB12
1.0000 | ORAL_TABLET | Freq: Two times a day (BID) | ORAL | Status: DC | PRN
  Administered 2011-09-16 – 2011-09-18 (×3): 1 via ORAL
  Filled 2011-09-14 (×3): qty 1

## 2011-09-14 MED ORDER — ALBUTEROL SULFATE (5 MG/ML) 0.5% IN NEBU
2.5000 mg | INHALATION_SOLUTION | Freq: Four times a day (QID) | RESPIRATORY_TRACT | Status: DC
  Administered 2011-09-15 – 2011-09-17 (×8): 2.5 mg via RESPIRATORY_TRACT
  Filled 2011-09-14 (×8): qty 0.5

## 2011-09-14 NOTE — Progress Notes (Signed)
SUBJECTIVE:  Weak but breathing better.  Still not at baseline.  She denies pain.   PHYSICAL EXAM Filed Vitals:   09/13/11 1930 09/13/11 2040 09/13/11 2336 09/14/11 0434  BP: 145/65 119/56 120/58 121/54  Pulse: 39 64 65 62  Temp:  97.9 F (36.6 C)  99.4 F (37.4 C)  TempSrc:  Oral  Oral  Resp: 21 20  20   Height:  5\' 2"  (1.575 m)    Weight:  68.811 kg (151 lb 11.2 oz)  68.493 kg (151 lb)  SpO2: 85% 92%  90%   General:  Chronically ill appearing Lungs:  Improved but crackles still at the left lower lobe Heart:  RRR Abdomen:  Positive bowel sounds, no rebound no guarding Extremities:  Edema improved  LABS: Lab Results  Component Value Date   CKTOTAL 42 09/14/2011   CKMB 2.5 09/14/2011   TROPONINI <0.30 09/14/2011   Results for orders placed during the hospital encounter of 09/13/11 (from the past 24 hour(s))  CBC     Status: Abnormal   Collection Time   09/13/11  2:38 PM      Component Value Range   WBC 10.9 (*) 4.0 - 10.5 (K/uL)   RBC 4.27  3.87 - 5.11 (MIL/uL)   Hemoglobin 12.3  12.0 - 15.0 (g/dL)   HCT 86.5  78.4 - 69.6 (%)   MCV 85.5  78.0 - 100.0 (fL)   MCH 28.8  26.0 - 34.0 (pg)   MCHC 33.7  30.0 - 36.0 (g/dL)   RDW 29.5 (*) 28.4 - 15.5 (%)   Platelets 236  150 - 400 (K/uL)  DIFFERENTIAL     Status: Abnormal   Collection Time   09/13/11  2:38 PM      Component Value Range   Neutrophils Relative 75  43 - 77 (%)   Neutro Abs 8.2 (*) 1.7 - 7.7 (K/uL)   Lymphocytes Relative 11 (*) 12 - 46 (%)   Lymphs Abs 1.2  0.7 - 4.0 (K/uL)   Monocytes Relative 13 (*) 3 - 12 (%)   Monocytes Absolute 1.4 (*) 0.1 - 1.0 (K/uL)   Eosinophils Relative 1  0 - 5 (%)   Eosinophils Absolute 0.1  0.0 - 0.7 (K/uL)   Basophils Relative 1  0 - 1 (%)   Basophils Absolute 0.1  0.0 - 0.1 (K/uL)  COMPREHENSIVE METABOLIC PANEL     Status: Abnormal   Collection Time   09/13/11  2:38 PM      Component Value Range   Sodium 132 (*) 135 - 145 (mEq/L)   Potassium 4.1  3.5 - 5.1 (mEq/L)   Chloride 91 (*) 96 - 112 (mEq/L)   CO2 32  19 - 32 (mEq/L)   Glucose, Bld 119 (*) 70 - 99 (mg/dL)   BUN 12  6 - 23 (mg/dL)   Creatinine, Ser 1.32  0.50 - 1.10 (mg/dL)   Calcium 9.5  8.4 - 44.0 (mg/dL)   Total Protein 7.8  6.0 - 8.3 (g/dL)   Albumin 2.9 (*) 3.5 - 5.2 (g/dL)   AST 25  0 - 37 (U/L)   ALT 13  0 - 35 (U/L)   Alkaline Phosphatase 64  39 - 117 (U/L)   Total Bilirubin 0.8  0.3 - 1.2 (mg/dL)   GFR calc non Af Amer 57 (*) >90 (mL/min)   GFR calc Af Amer 66 (*) >90 (mL/min)  CARDIAC PANEL(CRET KIN+CKTOT+MB+TROPI)     Status: Normal   Collection Time  09/13/11  2:42 PM      Component Value Range   Total CK 55  7 - 177 (U/L)   CK, MB 2.8  0.3 - 4.0 (ng/mL)   Troponin I <0.30  <0.30 (ng/mL)   Relative Index RELATIVE INDEX IS INVALID  0.0 - 2.5   PRO B NATRIURETIC PEPTIDE     Status: Abnormal   Collection Time   09/13/11  2:42 PM      Component Value Range   Pro B Natriuretic peptide (BNP) 4661.0 (*) 0 - 450 (pg/mL)  MRSA PCR SCREENING     Status: Normal   Collection Time   09/13/11  8:58 PM      Component Value Range   MRSA by PCR NEGATIVE  NEGATIVE   CBC     Status: Abnormal   Collection Time   09/13/11  9:18 PM      Component Value Range   WBC 9.0  4.0 - 10.5 (K/uL)   RBC 4.25  3.87 - 5.11 (MIL/uL)   Hemoglobin 12.0  12.0 - 15.0 (g/dL)   HCT 16.1  09.6 - 04.5 (%)   MCV 86.4  78.0 - 100.0 (fL)   MCH 28.2  26.0 - 34.0 (pg)   MCHC 32.7  30.0 - 36.0 (g/dL)   RDW 40.9 (*) 81.1 - 15.5 (%)   Platelets 219  150 - 400 (K/uL)  CREATININE, SERUM     Status: Abnormal   Collection Time   09/13/11  9:18 PM      Component Value Range   Creatinine, Ser 0.89  0.50 - 1.10 (mg/dL)   GFR calc non Af Amer 58 (*) >90 (mL/min)   GFR calc Af Amer 67 (*) >90 (mL/min)  CARDIAC PANEL(CRET KIN+CKTOT+MB+TROPI)     Status: Normal   Collection Time   09/13/11  9:18 PM      Component Value Range   Total CK 49  7 - 177 (U/L)   CK, MB 2.7  0.3 - 4.0 (ng/mL)   Troponin I <0.30  <0.30  (ng/mL)   Relative Index RELATIVE INDEX IS INVALID  0.0 - 2.5   BASIC METABOLIC PANEL     Status: Abnormal   Collection Time   09/14/11  3:13 AM      Component Value Range   Sodium 133 (*) 135 - 145 (mEq/L)   Potassium 3.5  3.5 - 5.1 (mEq/L)   Chloride 91 (*) 96 - 112 (mEq/L)   CO2 32  19 - 32 (mEq/L)   Glucose, Bld 100 (*) 70 - 99 (mg/dL)   BUN 13  6 - 23 (mg/dL)   Creatinine, Ser 9.14  0.50 - 1.10 (mg/dL)   Calcium 9.2  8.4 - 78.2 (mg/dL)   GFR calc non Af Amer 64 (*) >90 (mL/min)   GFR calc Af Amer 74 (*) >90 (mL/min)  CARDIAC PANEL(CRET KIN+CKTOT+MB+TROPI)     Status: Normal   Collection Time   09/14/11  3:13 AM      Component Value Range   Total CK 42  7 - 177 (U/L)   CK, MB 2.5  0.3 - 4.0 (ng/mL)   Troponin I <0.30  <0.30 (ng/mL)   Relative Index RELATIVE INDEX IS INVALID  0.0 - 2.5     Intake/Output Summary (Last 24 hours) at 09/14/11 0826 Last data filed at 09/14/11 0136  Gross per 24 hour  Intake      3 ml  Output   2000 ml  Net  -1997  ml    ASSESSMENT AND PLAN:   1) Acute on chronic diastolic congestive heart failure: Volume better but still increased. I will continue with the IV Lasix at the current dose today.  Watch Na, creat.   2) Hypertension:  BP is controlled    3) Atrial fibrillation:  Discussed with Dr. Clelia Croft.  She is in NSR.  Amiodarone was stopped recently and apparently there is some interstitial lung disease on PFTs.  I cannot find these results.  I sent a note to Dr. Delford Field.  We need his opinion on these and I suspect that we will need to hold the amiodarone permanently.       Fayrene Fearing [facility] 09/14/2011 8:26 AM

## 2011-09-14 NOTE — Progress Notes (Signed)
CTSP for increasing SOB and lower O2 sats.   Pt says breathing is about the same as this am, but O2 sats were 85%, now 92% on 4 liters. She just rec'd pm Lasix 80mg  IV. No CP.   On exam, rectal temp 101.4, VS as recorded. JVD to jaw, right greater than left. Lungs with slight exp wheeze right and basilar crackles, also rales; left with dense dry rales, no wheeze noted, ?Crackles  MD in to see pt,  May need repeat CXR with ?decubitus films, possible Tx stepdown and antibiotics.    Attending Note:   The patient was seen and examined.  Agree with assessment and plan as noted above.  Pt has a temp of 101.4.  She complains of severe dyspnea and looks uncomfortable.   Wt. Is down 9 lbs from yesterday  HEENT: elevated JVD  Lungs:  Bilateral wheezes, coarse rhonchi and rales in the left base.  Decrease breath sounds and rales in the right base/ Cor.  RR  Ext: soft 1-2 + pitting edema  Echo in 10/12 showed normal LV systolic function.    Imp:   I suspect she has a pneumonia with subsequent worsening diastolic CHF.  Her weight is down 9 lbs today Plan:   PA and Lat CXR. Abx coverage Transfer to stepdown. Will discuss with Dr. Clelia Croft and he can see in the AM.     Alvia Grove., MD, [facility] 09/14/2011, 7:51 PM

## 2011-09-14 NOTE — Progress Notes (Signed)
Noted pt with distended neck veins, Rt. Greater than left & crackles & wheezing to Bil bases. 02 sat 84% on 2L. VS Temp 99.2, P64 R24, BP117/67.  Ronny Flurry, PA notified.  Instructed nurse she will come up to see pt.

## 2011-09-14 NOTE — Progress Notes (Signed)
UR Completed.  [name] 09/14/2011 [phone]  

## 2011-09-14 NOTE — Progress Notes (Addendum)
Pt c/o productive cough.  Notified Alinda Money, [place] taking call for R. Barrett, PA

## 2011-09-15 ENCOUNTER — Telehealth: Payer: Self-pay | Admitting: Critical Care Medicine

## 2011-09-15 LAB — BASIC METABOLIC PANEL
BUN: 15 mg/dL (ref 6–23)
CO2: 39 mEq/L — ABNORMAL HIGH (ref 19–32)
Calcium: 9.2 mg/dL (ref 8.4–10.5)
GFR calc non Af Amer: 52 mL/min — ABNORMAL LOW (ref >90)
Glucose, Bld: 94 mg/dL (ref 70–99)
Potassium: 3.5 mEq/L (ref 3.5–5.1)

## 2011-09-15 LAB — CARDIAC PANEL(CRET KIN+CKTOT+MB+TROPI)
CK, MB: 1.8 ng/mL (ref 0.3–4.0)
CK, MB: 1.7 ng/mL (ref 0.3–4.0)
CK, MB: 1.6 ng/mL (ref 0.3–4.0)
Relative Index: INVALID (ref 0.0–2.5)
Total CK: 33 U/L (ref 7–177)
Troponin I: <0.3 ng/mL (ref <0.30)
Troponin I: <0.3 ng/mL (ref <0.30)
Troponin I: <0.3 ng/mL (ref <0.30)

## 2011-09-15 LAB — INFLUENZA PANEL BY PCR (TYPE A & B)
Influenza A By PCR: POSITIVE — AB
Influenza B By PCR: NEGATIVE

## 2011-09-15 MED ORDER — OSELTAMIVIR PHOSPHATE 75 MG PO CAPS
75.0000 mg | ORAL_CAPSULE | Freq: Two times a day (BID) | ORAL | Status: AC
  Administered 2011-09-15 – 2011-09-19 (×10): 75 mg via ORAL
  Filled 2011-09-15 (×12): qty 1

## 2011-09-15 MED ORDER — BIOTENE DRY MOUTH MT LIQD
15.0000 mL | Freq: Two times a day (BID) | OROMUCOSAL | Status: DC
  Administered 2011-09-15 – 2011-09-21 (×12): 15 mL via OROMUCOSAL

## 2011-09-15 NOTE — Progress Notes (Signed)
SUBJECTIVE:  She says that she feels OK but tired.  Denies any pain   PHYSICAL EXAM Filed Vitals:   09/15/11 0430 09/15/11 0500 09/15/11 0600 09/15/11 0700  BP: 127/42 125/38 124/61 125/56  Pulse: 66 59 66 64  Temp:      TempSrc:      Resp: 22 19 20 20   Height:      Weight:    146 lb 9.7 oz (66.5 kg)  SpO2: 94% 95% 97% 97%   General:  Weak appearing, no distress Lungs:  Improved with fewer fine crackles but still with rales 1/3 up and scattered coarse crackles Heart:  RRR Abdomen:  Positive bowel sounds, no rebound no guarding Extremities:  Decreased edema  LABS: Lab Results  Component Value Date   CKTOTAL 33 09/15/2011   CKMB 1.8 09/15/2011   TROPONINI <0.30 09/15/2011   Results for orders placed during the hospital encounter of 09/13/11 (from the past 24 hour(s))  CARDIAC PANEL(CRET KIN+CKTOT+MB+TROPI)     Status: Normal   Collection Time   09/14/11  9:07 AM      Component Value Range   Total CK 37  7 - 177 (U/L)   CK, MB 2.0  0.3 - 4.0 (ng/mL)   Troponin I <0.30  <0.30 (ng/mL)   Relative Index RELATIVE INDEX IS INVALID  0.0 - 2.5   CARDIAC PANEL(CRET KIN+CKTOT+MB+TROPI)     Status: Normal   Collection Time   09/14/11  4:12 PM      Component Value Range   Total CK 39  7 - 177 (U/L)   CK, MB 2.1  0.3 - 4.0 (ng/mL)   Troponin I <0.30  <0.30 (ng/mL)   Relative Index RELATIVE INDEX IS INVALID  0.0 - 2.5   CARDIAC PANEL(CRET KIN+CKTOT+MB+TROPI)     Status: Normal   Collection Time   09/14/11  9:40 PM      Component Value Range   Total CK 36  7 - 177 (U/L)   CK, MB 1.9  0.3 - 4.0 (ng/mL)   Troponin I <0.30  <0.30 (ng/mL)   Relative Index RELATIVE INDEX IS INVALID  0.0 - 2.5   CARDIAC PANEL(CRET KIN+CKTOT+MB+TROPI)     Status: Normal   Collection Time   09/15/11  2:37 AM      Component Value Range   Total CK 33  7 - 177 (U/L)   CK, MB 1.8  0.3 - 4.0 (ng/mL)   Troponin I <0.30  <0.30 (ng/mL)   Relative Index RELATIVE INDEX IS INVALID  0.0 - 2.5   BASIC  METABOLIC PANEL     Status: Abnormal   Collection Time   09/15/11  2:37 AM      Component Value Range   Sodium 130 (*) 135 - 145 (mEq/L)   Potassium 3.5  3.5 - 5.1 (mEq/L)   Chloride 85 (*) 96 - 112 (mEq/L)   CO2 39 (*) 19 - 32 (mEq/L)   Glucose, Bld 94  70 - 99 (mg/dL)   BUN 15  6 - 23 (mg/dL)   Creatinine, Ser 1.61  0.50 - 1.10 (mg/dL)   Calcium 9.2  8.4 - 09.6 (mg/dL)   GFR calc non Af Amer 52 (*) >90 (mL/min)   GFR calc Af Amer 60 (*) >90 (mL/min)    Intake/Output Summary (Last 24 hours) at 09/15/11 0742 Last data filed at 09/15/11 0600  Gross per 24 hour  Intake    634 ml  Output   2675 ml  Net  -2041 ml    ASSESSMENT AND PLAN:  1) Acute on chronic diastolic congestive heart failure:  She had increasing dyspnea despite her weights being down and good urine output.  Started on antibiotics last night.  CXR was unchanged with moderate CHF.  I will encourage good pulmonary toilet and continue the IV lasix today.    2) Hypertension: BP is controlled   3) Atrial fibrillation: Discussed with Dr. Clelia Croft yesterday. She is in NSR. Amiodarone was stopped recently and apparently there is some interstitial lung disease on PFTs. I cannot find these results. I sent a note to Dr. Delford Field. We need his opinion on these and I suspect that we will need to hold the amiodarone permanently.      [name] [facility] 09/15/2011 7:42 AM

## 2011-09-15 NOTE — Clinical Documentation Improvement (Signed)
RESPIRATORY FAILURE DOCUMENTATION CLARIFICATION QUERY   THIS DOCUMENT IS NOT A PERMANENT PART OF THE MEDICAL RECORD  TO RESPOND TO THE THIS QUERY, FOLLOW THE INSTRUCTIONS BELOW:  1. If needed, update documentation for the patient's encounter via the notes activity.  2. Access this query again and click edit on the In [employer].  3. After updating, or not, click F2 to complete all highlighted (required) fields concerning your review. Select "additional documentation in the medical record" OR "no additional documentation provided".  4. Click Sign note button.  5. The deficiency will fall out of your In Basket *Please let us know if you are not able to complete this workflow by phone or e-mail (listed below).  Please update your documentation within the medical record to reflect your response to this query.                                                                                    09/15/11  Dear Dr.Rhonda Raford Pitcher Marton Redwood,  In a better effort to capture your patient's severity of illness, reflect appropriate length of stay and utilization of resources, a review of the patient medical record has revealed the following indicators.    Based on your clinical judgment, please clarify and document in a progress note and/or discharge summary the clinical condition associated with the following supporting information:  In responding to this query please exercise your independent judgment.  The fact that a query is asked, does not imply that any particular answer is desired or expected.  Possible Clinical Conditions?   _______Acute Respiratory Failure  _______Acute on Chronic Respiratory Failure  _______Other Condition________________  _______Cannot Clinically Determine  Clinical findings:   Risk Factors: pmh: diastolic chf; atrial fib  Signs&Symptoms: per H&P: She has had progressive dyspnea times 1 week that did not respond to increased lasix at home.  She has had a 10 lb  weight gain in one week.  She might have had some increased salt.  She denies chest pain.  She came into the ER when the dyspnea became too severe. Pt reports a one week history of worsening SOB, increased weight gain of approximately 10 lbs. She reports an uninhibited consumption of salty foods recently. She endorses 1 pillow orthopnea and PND at baseline. She reports abdominal fullness  Diagnostics: O2 sats: 85%  Lab: BNP: 4661  Radiology:  Unchanged dual lead left subclavian cardiac pacemaker. Decreased lung volumes are present compared to prior.  There is bilateral perihilar and basilar predominant airspace disease compatible with pulmonary edema and CHF.  Cephalization of pulmonary blood flow is present with pulmonary vascular congestion. These changes are superimposed on chronic lung disease.  IMPRESSION: Moderate CHF.  Unchanged pacemaker apparatus.  Treatment: O2 Mode:  cannula Oxygen: O2 concentrations: 4-6 l/m  Respiratory Treatment: albuterol   You may use possible, probable, or suspect with inpatient documentation. possible, probable, suspected diagnoses MUST be documented at the time of discharge  Reviewed:  no additional documentation provided [facility]  Thank You,  Amada Kingfisher RN, BSN, CCM  Clinical Documentation Specialist: [phone]  [email or web address]  Health Information Management [place]

## 2011-09-15 NOTE — Progress Notes (Signed)
While repositioning pt; noticed that pt was warmer than normal to touch; temperature taken and resulted at 100.9.  Pt denies any complaints.  Will continue to monitor and reassess frequently.  No PRN tylenol medication at this time.  Will notify MD if necessary.

## 2011-09-15 NOTE — Progress Notes (Signed)
Attempted to ween down pt's O2 from 6L to 4L;  Pt's sats dropped to 91-92%.  Increased O2 back up to 6L.  Will continue to monitor, reassess and update as needed

## 2011-09-15 NOTE — Consult Note (Signed)
PCP:   [name], MD, MD   Reason for consult: Fever  HPI: Ms. Callanan is a 77 year old white female with a history of atrial fibrillation/tachybradycardia syndrome status post pacemaker placement (10/12) congestive heart failure secondary to diastolic dysfunction, and possible interstitial lung disease who was admitted on 12/26 for increasing shortness of breath. She reports a gradual increase in shortness of breath over the past 7-10 days associated with a 10 pound weight gain. She also noticed increasing lower extremity edema. Initially, she had a minimally productive cough and endorses orthopnea and worsening dyspnea on exertion.  She was admitted to the cardiology service and placed on IV Lasix with effective diuresis. Last evening, she was noted to have a fever of 101.4 associated with increased cough and yellow sputum production.  Chest x-ray shows persistent bilateral interstitial and airspace opacities. She was empirically placed on Rocephin and clindamycin to cover anaerobic causes and I was called for consult.  Of note, her husband did have a recent flulike illness characterized by fever of 102 which has resolved without treatment.  Review of Systems:  Review of Systems - all systems reviewed and are negative except in the history of present illness with following exceptions: Increased fatigue.  Denies myalgias.  Past Medical History: Past Medical History  Diagnosis Date  . Pneumonia 04/2010  . Hypertension   . Renal artery stenosis 2008    STATUS POST ANGIOPLASTY  . GERD (gastroesophageal reflux disease)   . Back pain     CHRONIC  . Atrial fibrillation     Prior TEE/CV in May 2012  . Hyponatremia     improved  . Seizure disorder     on anti-epileptic therapy  . Urinary incontinence   . Osteopenia   . SIADH (syndrome of inappropriate ADH production)     h/o HYPONATREMIA ; LIKELY RELATED TO HER LUNG PROCESS  . Arthritis   . Hiatal hernia   . Hiatal hernia   .  Tachy-brady syndrome     with pauses noted in May 2012  . Dysphagia    Past Surgical History  Procedure Date  . Cholecystectomy   . Back surgery     X2  . Total knee arthroplasty     RIGHT AND LEFT KNEE  . Tonsillectomy   . Appendectomy   . Cardiac catheterization 2007    Normal  . Knee arthroscopy     LEFT KNEE  . Cataract extraction   . Shoulder surgery   . Renal angioplasty 01/2007    RIGHT  . Partial hysterectomy   . Insert / replace / remove pacemaker 06/2011  . Eye surgery     cataracts removed on both eyes    Medications: Prior to Admission medications   Medication Sig Start Date End Date Taking? Authorizing Provider  amLODipine-olmesartan (AZOR) 10-40 MG per tablet Take 1 tablet by mouth daily.     Yes Historical Provider, MD  Calcium Citrate (CITRACAL PO) Take 2 tablets by mouth daily.    Yes Historical Provider, MD  cholecalciferol (VITAMIN D) 1000 UNITS tablet Take 1,000 Units by mouth daily.     Yes Historical Provider, MD  dabigatran (PRADAXA) 75 MG CAPS Take 75 mg by mouth every 12 (twelve) hours.     Yes Historical Provider, MD  divalproex (DEPAKOTE) 500 MG EC tablet Take 500 mg by mouth at bedtime.     Yes Historical Provider, MD  furosemide (LASIX) 40 MG tablet Take 1 tablet (40 mg total) by mouth 2 (  two) times daily. 09/07/11  Yes Peter [place], MD  HYDROcodone-acetaminophen (VICODIN) 5-500 MG per tablet Take 1 tablet by mouth every 6 (six) hours as needed. For pain    Yes Historical Provider, MD  lisinopril (PRINIVIL,ZESTRIL) 40 MG tablet Take 40 mg by mouth 2 (two) times daily.     Yes Historical Provider, MD  methocarbamol (ROBAXIN) 500 MG tablet Take 500 mg by mouth 2 (two) times daily as needed. For pain   Yes Historical Provider, MD  metoprolol tartrate (LOPRESSOR) 25 MG tablet Take 1 tablet (25 mg total) by mouth 2 (two) times daily. 09/07/11  Yes Peter [place], MD  Multiple Vitamins-Minerals (MULTIVITAMINS THER. W/MINERALS) TABS Take 1 tablet by mouth  daily.     Yes Historical Provider, MD  omeprazole (PRILOSEC) 40 MG capsule Take 40 mg by mouth daily.     Yes Historical Provider, MD  polyethylene glycol powder (GLYCOLAX/MIRALAX) powder Take 8 g by mouth daily as needed. For constipation   Yes Historical Provider, MD  timolol (BETIMOL) 0.5 % ophthalmic solution Place 1 drop into both eyes 2 (two) times daily.    Yes Historical Provider, MD  travoprost, benzalkonium, (TRAVATAN) 0.004 % ophthalmic solution Place 1 drop into the left eye at bedtime.    Yes Historical Provider, MD    Current medications: . albuterol  2.5 mg Nebulization QID  . amLODipine  10 mg Oral Daily  . antiseptic oral rinse  15 mL Mouth Rinse BID  . cefTRIAXone (ROCEPHIN)  IV  1 g Intravenous Q24H  . cholecalciferol  1,000 Units Oral Daily  . clindamycin (CLEOCIN) IV  300 mg Intravenous Q8H  . dabigatran  75 mg Oral Q12H  . divalproex  500 mg Oral QHS  . furosemide  80 mg Intravenous BID  . furosemide  80 mg Intravenous Once  . lisinopril  40 mg Oral BID  . metoprolol tartrate  25 mg Oral BID  . multivitamins ther. w/minerals  1 tablet Oral Daily  . olmesartan  40 mg Oral Daily  . pantoprazole  80 mg Oral Q1200  . potassium chloride  10 mEq Oral BID  . sodium chloride  3 mL Intravenous Q12H  . timolol  1 drop Both Eyes BID  . travoprost (benzalkonium)  1 drop Left Eye QHS  . DISCONTD: timolol  1 drop Both Eyes BID   Allergies:  No Known Allergies  Social History: She is remarried with 3 children, 2 grandchildren, and   3 great-grandchildren.  She has a tenth grade education and is retired   from [employer] since 1980.  No smoking or drug use.    FAMILY HISTORY:  Father died at 33 due to diabetes and prostate cancer.   Mother died at 61 due to diabetes and heart and stroke.  She has a   brother with bladder and renal cancer.  Brother with lung disease.   Brother died due to motor vehicle accident.  Sister with lung disease.   Her daughter and son  have hypertension.    Physical Exam: Filed Vitals:   09/15/11 0430 09/15/11 0500 09/15/11 0600 09/15/11 0700  BP: 127/42 125/38 124/61 125/56  Pulse: 66 59 66 64  Temp:      TempSrc:      Resp: 22 19 20 20   Height:      Weight:    66.5 kg (146 lb 9.7 oz)  SpO2: 94% 95% 97% 97%   General appearance: alert and Fatigued, chronically ill Head: Normocephalic,  without obvious abnormality, atraumatic Eyes: conjunctivae/corneas clear. PERRL, EOM's intact.  Nose: Nares normal. Septum midline. Mucosa normal. No drainage or sinus tenderness. Throat: lips, mucosa, and tongue normal; teeth and gums normal Neck: no adenopathy, no carotid bruit, no JVD and thyroid not enlarged, symmetric, no tenderness/mass/nodules Resp: rales bilaterally 1/3 way up with associated rhonchi  Cardio: regular rate and rhythm, S1, S2 normal, no murmur, click, rub or gallop GI: soft, non-tender; bowel sounds normal; no masses,  no organomegaly Extremities: extremities normal, atraumatic, no cyanosis or edema Pulses: 2+ and symmetric Lymph nodes: Cervical adenopathy: no cervical lymphadenopathy Neurologic: Alert and oriented X 3, normal strength and tone. Normal symmetric reflexes.     Labs on Admission:   [facility] 09/15/11 0237 09/14/11 0313  NA 130* 133*  K 3.5 3.5  CL 85* 91*  CO2 39* 32  GLUCOSE 94 100*  BUN 15 13  CREATININE 0.98 0.82  CALCIUM 9.2 9.2  MG -- --  PHOS -- --    Basename 09/13/11 1438  AST 25  ALT 13  ALKPHOS 64  BILITOT 0.8  PROT 7.8  ALBUMIN 2.9*   No results found for this basename: LIPASE:2,AMYLASE:2 in the last 72 hours  Basename 09/13/11 2118 09/13/11 1438  WBC 9.0 10.9*  NEUTROABS -- 8.2*  HGB 12.0 12.3  HCT 36.7 36.5  MCV 86.4 85.5  PLT 219 236    Basename 09/15/11 0237 09/14/11 2140 09/14/11 1612  CKTOTAL 33 36 39  CKMB 1.8 1.9 2.1  CKMBINDEX -- -- --  TROPONINI <0.30 <0.30 <0.30   Lab Results  Component Value Date   INR 2.19* 07/14/2011   INR 2.62*  02/09/2011   INR 3.63* 02/08/2011   No results found for this basename: TSH,T4TOTAL,FREET3,T3FREE,THYROIDAB in the last 72 hours No results found for this basename: VITAMINB12:2,FOLATE:2,FERRITIN:2,TIBC:2,IRON:2,RETICCTPCT:2 in the last 72 hours  Radiological Exams on Admission: Dg Chest Port 1 View  09/14/2011  *RADIOLOGY REPORT*  Clinical Data: Shortness of breath and respiratory distress  PORTABLE CHEST - 1 VIEW  Comparison: 09/13/2011  Findings: There is a left chest wall pacer device with lead in the right atrial appendage and right ventricle.  The heart size appears normal.  No significant change in the bilateral interstitial and airspace opacities.  IMPRESSION:  1.  No change in aeration the lungs compared with previous exam.  Original Report Authenticated By: Rosealee Albee, M.D.   Dg Chest Port 1 View  09/13/2011  *RADIOLOGY REPORT*  Clinical Data: Short of breath for 1 month.  Difficulty breathing today.  PORTABLE CHEST - 1 VIEW  Comparison: 08/24/2011.  Findings: Unchanged dual lead left subclavian cardiac pacemaker. Decreased lung volumes are present compared to prior.  There is bilateral perihilar and basilar predominant airspace disease compatible with pulmonary edema and CHF.  Cephalization of pulmonary blood flow is present with pulmonary vascular congestion. These changes are superimposed on chronic lung disease.  IMPRESSION: Moderate CHF.  Unchanged pacemaker apparatus.  Original Report Authenticated By: Andreas Newport, M.D.   Orders placed in visit on 09/14/11  . EKG 12-LEAD    Assessment/Plan 1. Fever/pneumonia- new onset fever and worsening dyspnea with purulent sputum is consistent with superimposed pneumonia. I suspect her recent symptoms are more related to heart failure but she may now also have pneumonia Rocephin and clindamycin are reasonable coverage options and will be continued. If she develops diarrhea with clindamycin and Zosyn may be an alternative option. Blood  cultures have been obtained. We'll obtain a flu swab given  her husband's recent flulike illness in the influenza outbreak in the community. We'll add Tamiflu if positive.  2. Dyspnea- she has been diagnosed with interstitial lung disease in the past has seen pulmonology. Her amiodarone has been recently discontinued. It is unclear to me whether she has had formal pulmonary function test as I am unable to find these results and she does reck not recall having this done.  Consider PFTs when stable. 3. Acute on chronic diastolic congestive heart failure- diuresis per cardiology 4.  Atrial fibrillation- management per cardiology.  5. Hypertension- well controlled I appreciate cardiology management we will follow her course closely with you and assist with transition to the outpatient setting.     Jehu Mccauslin,W DOUGLAS 09/15/2011, 8:38 AM

## 2011-09-15 NOTE — Telephone Encounter (Signed)
[name] from dr. Elvis Coil office called to ask that this msg be disregarded as she was told by dr [place] that dr Delford Field does not have a pft for them. [name]

## 2011-09-16 DIAGNOSIS — R0989 Other specified symptoms and signs involving the circulatory and respiratory systems: Secondary | ICD-10-CM

## 2011-09-16 DIAGNOSIS — R0609 Other forms of dyspnea: Secondary | ICD-10-CM

## 2011-09-16 LAB — CARDIAC PANEL(CRET KIN+CKTOT+MB+TROPI): CK, MB: 1.3 ng/mL (ref 0.3–4.0)

## 2011-09-16 LAB — BASIC METABOLIC PANEL
BUN: 20 mg/dL (ref 6–23)
Chloride: 87 mEq/L — ABNORMAL LOW (ref 96–112)
Glucose, Bld: 96 mg/dL (ref 70–99)
Potassium: 4 mEq/L (ref 3.5–5.1)

## 2011-09-16 MED ORDER — FLORA-Q PO CAPS
1.0000 | ORAL_CAPSULE | Freq: Every day | ORAL | Status: DC
  Administered 2011-09-16 – 2011-09-21 (×6): 1 via ORAL
  Filled 2011-09-16 (×6): qty 1

## 2011-09-16 NOTE — Progress Notes (Signed)
Patient ID: [name], female   DOB: Sep 23, 1924, 77 y.o.   MRN: [id number] SUBJECTIVE: Mrs Picking is feeling a little bit better today. Her shortness of breath is improved and she denies any chest discomfort with breathing. She's had no cough. Her feet are much less swollen. Her BUN and creatinine are starting to increase. She was -872 cc yesterday. Filed Vitals:   09/16/11 0751 09/16/11 0800 09/16/11 0959 09/16/11 1000  BP: 97/37  118/58 118/58  Pulse: 65 61  64  Temp:      TempSrc:      Resp: 16     Height:      Weight:      SpO2: 94% 93%      Intake/Output Summary (Last 24 hours) at 09/16/11 1015 Last data filed at 09/16/11 0700  Gross per 24 hour  Intake    278 ml  Output   1150 ml  Net   -872 ml    LABS: Basic Metabolic Panel:  Basename 09/16/11 0600 09/15/11 0237  NA 132* 130*  K 4.0 3.5  CL 87* 85*  CO2 39* 39*  GLUCOSE 96 94  BUN 20 15  CREATININE 1.19* 0.98  CALCIUM 8.8 9.2  MG -- --  PHOS -- --   Liver Function Tests:  Basename 09/13/11 1438  AST 25  ALT 13  ALKPHOS 64  BILITOT 0.8  PROT 7.8  ALBUMIN 2.9*   No results found for this basename: LIPASE:2,AMYLASE:2 in the last 72 hours CBC:  Basename 09/13/11 2118 09/13/11 1438  WBC 9.0 10.9*  NEUTROABS -- 8.2*  HGB 12.0 12.3  HCT 36.7 36.5  MCV 86.4 85.5  PLT 219 236   Cardiac Enzymes:  Basename 09/16/11 0224 09/15/11 2137 09/15/11 1507  CKTOTAL 23 29 31   CKMB 1.3 1.4 1.6  CKMBINDEX -- -- --  TROPONINI <0.30 <0.30 <0.30   BNP: No components found with this basename: POCBNP:3 D-Dimer: No results found for this basename: DDIMER:2 in the last 72 hours Hemoglobin A1C: No results found for this basename: HGBA1C in the last 72 hours Fasting Lipid Panel: No results found for this basename: CHOL,HDL,LDLCALC,TRIG,CHOLHDL,LDLDIRECT in the last 72 hours Thyroid Function Tests: No results found for this basename: TSH,T4TOTAL,FREET3,T3FREE,THYROIDAB in the last 72 hours Anemia Panel: No results  found for this basename: VITAMINB12,FOLATE,FERRITIN,TIBC,IRON,RETICCTPCT in the last 72 hours  RADIOLOGY: Dg Chest 2 View  08/24/2011  *RADIOLOGY REPORT*  Clinical Data: Follow-up lung nodule  CHEST - 2 VIEW  Comparison: 08/23/2011  Findings: Nipple markers were utilized.  1 cm nodular density in the right lung base does not correspond to the nipple marker.  This may be a lung nodule however there is scarring in the bases in this may be overlapping lung markings.  COPD.  Bibasilar scarring.  This is most prominent the bases.  No definite heart failure or pneumonia.  IMPRESSION: COPD with scarring.  1 cm right lower lobe nodule does not correspond to the nipple shadow.  Follow-up chest CT is suggested to rule out a lung nodule.  Original Report Authenticated By: Camelia Phenes, M.D.   Dg Chest 2 View  08/23/2011  *RADIOLOGY REPORT*  Clinical Data: Dyspnea.Shortness of breath.  Nonsmoker.  CHEST - 2 VIEW  Comparison: 07/24/2011 and 02/06/2011.  Findings: Slight improved aeration left lung.  Pulmonary vascular congestion most notable centrally superimposed on chronic changes.  1.2 cm nodular density right lower lobe.  PA view with nipple marker in place recommended.  Sequential pacemaker enters from  the left with leads unchanged in the region of the right atrium and right ventricle.  Thoracic compression deformities stable.  Calcified aorta.  IMPRESSION: Slight improved aeration left lung.  Pulmonary vascular congestion most notable centrally superimposed on chronic changes.  1.2 cm nodular density right lower lobe.  PA view with nipple marker in place recommended.  Original Report Authenticated By: Fuller Canada, M.D.   Dg Chest Port 1 View  09/14/2011  *RADIOLOGY REPORT*  Clinical Data: Shortness of breath and respiratory distress  PORTABLE CHEST - 1 VIEW  Comparison: 09/13/2011  Findings: There is a left chest Idris Edmundson pacer device with lead in the right atrial appendage and right ventricle.  The heart size  appears normal.  No significant change in the bilateral interstitial and airspace opacities.  IMPRESSION:  1.  No change in aeration the lungs compared with previous exam.  Original Report Authenticated By: Rosealee Albee, M.D.   Dg Chest Port 1 View  09/13/2011  *RADIOLOGY REPORT*  Clinical Data: Short of breath for 1 month.  Difficulty breathing today.  PORTABLE CHEST - 1 VIEW  Comparison: 08/24/2011.  Findings: Unchanged dual lead left subclavian cardiac pacemaker. Decreased lung volumes are present compared to prior.  There is bilateral perihilar and basilar predominant airspace disease compatible with pulmonary edema and CHF.  Cephalization of pulmonary blood flow is present with pulmonary vascular congestion. These changes are superimposed on chronic lung disease.  IMPRESSION: Moderate CHF.  Unchanged pacemaker apparatus.  Original Report Authenticated By: Andreas Newport, M.D.    PHYSICAL EXAM General: Well developed, well nourished, in no acute distress Head: Eyes PERRLA, No xanthomas.   Normal cephalic and atramatic  Lungs: Breath sounds decreased in both bases with inspiratory rhonchi. Heart: HRRR S1 S2 , no rub Pulses are 2+ & equal.            No carotid bruit. No JVD.  No abdominal bruits. No femoral bruits. Abdomen: Bowel sounds are positive, abdomen soft and non-tender without masses or                  Hernia's noted. Msk:  Back normal, normal gait. Normal strength and tone for age. Extremities: No clubbing, 1+ pitting edema bilaterally  DP +1 Neuro: Alert and oriented X 3. Psych:  Good affect, responds appropriately  TELEMETRY: Reviewed telemetry pt in A. fib with a well-controlled ventricular rate.  ASSESSMENT AND PLAN:  Principal Problem:  *Dyspnea Active Problems:  Hypertension  GERD (gastroesophageal reflux disease)  Atrial fibrillation  Acute on chronic diastolic congestive heart failure  PNA (pneumonia)  Patient Is Better in regards to her acute on chronic  diastolic heart failure. Her BUN and creatinine are started increased. She's are received her IV Lasix today. We'll discontinue the p.m. dose and reassess in the morning. Pneumonia being treated by internal medicine. We'll check BMP in the morning.   Valera Castle, MD 09/16/2011 10:15 AM

## 2011-09-16 NOTE — Progress Notes (Signed)
Subjective: Events and notes reviewed. Feels no worse. Maybe feels a touch better. Was oob to chair yesterday.  Objective: Vital signs in last 24 hours: Temp:  [97.6 F (36.4 C)-100.9 F (38.3 C)] 97.6 F (36.4 C) (12/29 0400) Pulse Rate:  [60-69] 64  (12/29 1000) Resp:  [16-20] 16  (12/29 0751) BP: (92-118)/(37-65) 118/58 mmHg (12/29 1000) SpO2:  [93 %-97 %] 97 % (12/29 1000) Weight:  [66.4 kg (146 lb 6.2 oz)] 146 lb 6.2 oz (66.4 kg) (12/29 0500) Weight change: -0.1 kg (-3.5 oz) Last BM Date: 09/15/11  Intake/Output from previous day: 12/28 0701 - 12/29 0700 In: 456 [P.O.:240; IV Piggyback:216] Out: 1150 [Urine:1150] Intake/Output this shift:    General appearance: alert, cooperative and appears stated age Resp: she has insp wheeze bilaterally throughout lung fields, she has dense rales at both bases.  no rhonchi.  reasonable air movement. Cardio: regular rate and rhythm, S1, S2 normal, no murmur, click, rub or gallop GI: soft, non-tender; bowel sounds normal; no masses,  no organomegaly Extremities: extremities normal, atraumatic, no cyanosis or edema Neurologic: Grossly normal   Lab Results:  Basename 09/13/11 2118 09/13/11 1438  WBC 9.0 10.9*  HGB 12.0 12.3  HCT 36.7 36.5  PLT 219 236   BMET  Basename 09/16/11 0600 09/15/11 0237  NA 132* 130*  K 4.0 3.5  CL 87* 85*  CO2 39* 39*  GLUCOSE 96 94  BUN 20 15  CREATININE 1.19* 0.98  CALCIUM 8.8 9.2   CMET CMP     Component Value Date/Time   NA 132* 09/16/2011 0600   K 4.0 09/16/2011 0600   CL 87* 09/16/2011 0600   CO2 39* 09/16/2011 0600   GLUCOSE 96 09/16/2011 0600   BUN 20 09/16/2011 0600   CREATININE 1.19* 09/16/2011 0600   CALCIUM 8.8 09/16/2011 0600   PROT 7.8 09/13/2011 1438   ALBUMIN 2.9* 09/13/2011 1438   AST 25 09/13/2011 1438   ALT 13 09/13/2011 1438   ALKPHOS 64 09/13/2011 1438   BILITOT 0.8 09/13/2011 1438   GFRNONAA 41* 09/16/2011 0600   GFRAA 47* 09/16/2011 0600      Studies/Results: Dg Chest Port 1 View  09/14/2011  *RADIOLOGY REPORT*  Clinical Data: Shortness of breath and respiratory distress  PORTABLE CHEST - 1 VIEW  Comparison: 09/13/2011  Findings: There is a left chest wall pacer device with lead in the right atrial appendage and right ventricle.  The heart size appears normal.  No significant change in the bilateral interstitial and airspace opacities.  IMPRESSION:  1.  No change in aeration the lungs compared with previous exam.  Original Report Authenticated By: Rosealee Albee, M.D.    Medications: I have reviewed the patient's current medications.  Assessment/Plan:  Principal Problem:  *Dyspnea-being treated for chf, flu and pna. And may have some underlying interstitial lung dz as well. Active Problems:  Hypertension-stable. Not hypotensive now.  GERD (gastroesophageal reflux disease)-follow  Atrial fibrillation-paced rhythm . On Pradaxa.  Acute on chronic diastolic congestive heart failure-per cardiology  PNA (pneumonia)-on treatment. i don't see why she would be on depakote. Husband will check home med list and we will stop it if she was not on it before.  It is listed on med rec as a home medicine but she is not aware of this and carries no diagnosis to go with it.  Add probiotic.   LOS: 3 days   Ezequiel Kayser, MD 09/16/2011, 11:04 AM

## 2011-09-17 LAB — BASIC METABOLIC PANEL
BUN: 17 mg/dL (ref 6–23)
CO2: 39 mEq/L — ABNORMAL HIGH (ref 19–32)
Chloride: 85 mEq/L — ABNORMAL LOW (ref 96–112)
Creatinine, Ser: 1.07 mg/dL (ref 0.50–1.10)

## 2011-09-17 MED ORDER — ALBUTEROL SULFATE (5 MG/ML) 0.5% IN NEBU
2.5000 mg | INHALATION_SOLUTION | Freq: Four times a day (QID) | RESPIRATORY_TRACT | Status: DC
  Administered 2011-09-17 – 2011-09-18 (×5): 2.5 mg via RESPIRATORY_TRACT
  Filled 2011-09-17 (×5): qty 0.5

## 2011-09-17 NOTE — Progress Notes (Signed)
Subjective: I spoke to Dr. Daleen Squibb.  She feels about the same as yesterday.  Objective: Vital signs in last 24 hours: Temp:  [98.1 F (36.7 C)-99.6 F (37.6 C)] 98.1 F (36.7 C) (12/30 0406) Pulse Rate:  [60-73] 63  (12/30 0900) Resp:  [16-20] 18  (12/30 0735) BP: (92-127)/(44-61) 127/57 mmHg (12/30 0931) SpO2:  [92 %-98 %] 92 % (12/30 0900) on 2 liters of oxygen Weight:  [67.1 kg (147 lb 14.9 oz)] 147 lb 14.9 oz (67.1 kg) (12/30 0500) Weight change: 0.7 kg (1 lb 8.7 oz) Last BM Date: 09/15/11  Intake/Output from previous day: 12/29 0701 - 12/30 0700 In: 933 [P.O.:660; I.V.:73; IV Piggyback:200] Out: 1000 [Urine:1000] Intake/Output this shift:    General appearance: alert, cooperative and appears stated age Resp: dense insp. crackles throughout both lung fields, decreased air movement bilaterally,  some insp. wheeze bilaterally too. Cardio: regular rate and rhythm, S1, S2 normal, no murmur, click, rub or gallop GI: soft, non-tender; bowel sounds normal; no masses,  no organomegaly Extremities: extremities normal, atraumatic, no cyanosis or edema Neurologic: Grossly normal   Lab Results: No results found for this basename: WBC:2,HGB:2,HCT:2,PLT:2 in the last 72 hours BMET  Basename 09/17/11 0540 09/16/11 0600  NA 128* 132*  K 3.8 4.0  CL 85* 87*  CO2 39* 39*  GLUCOSE 120* 96  BUN 17 20  CREATININE 1.07 1.19*  CALCIUM 8.7 8.8   CMET CMP     Component Value Date/Time   NA 128* 09/17/2011 0540   K 3.8 09/17/2011 0540   CL 85* 09/17/2011 0540   CO2 39* 09/17/2011 0540   GLUCOSE 120* 09/17/2011 0540   BUN 17 09/17/2011 0540   CREATININE 1.07 09/17/2011 0540   CALCIUM 8.7 09/17/2011 0540   PROT 7.8 09/13/2011 1438   ALBUMIN 2.9* 09/13/2011 1438   AST 25 09/13/2011 1438   ALT 13 09/13/2011 1438   ALKPHOS 64 09/13/2011 1438   BILITOT 0.8 09/13/2011 1438   GFRNONAA 46* 09/17/2011 0540   GFRAA 54* 09/17/2011 0540     Studies/Results: No results  found.  Medications: I have reviewed the patient's current medications.     Marland Kitchen albuterol  2.5 mg Nebulization Q6H  . amLODipine  10 mg Oral Daily  . antiseptic oral rinse  15 mL Mouth Rinse BID  . cefTRIAXone (ROCEPHIN)  IV  1 g Intravenous Q24H  . cholecalciferol  1,000 Units Oral Daily  . clindamycin (CLEOCIN) IV  300 mg Intravenous Q8H  . dabigatran  75 mg Oral Q12H  . divalproex  500 mg Oral QHS  . Flora-Q  1 capsule Oral Daily  . lisinopril  40 mg Oral BID  . metoprolol tartrate  25 mg Oral BID  . multivitamins ther. w/minerals  1 tablet Oral Daily  . olmesartan  40 mg Oral Daily  . oseltamivir  75 mg Oral BID  . pantoprazole  80 mg Oral Q1200  . potassium chloride  10 mEq Oral BID  . sodium chloride  3 mL Intravenous Q12H  . timolol  1 drop Both Eyes BID  . travoprost (benzalkonium)  1 drop Left Eye QHS  . DISCONTD: albuterol  2.5 mg Nebulization QID     Assessment/Plan:  Principal Problem:  *Dyspnea-there are several competing potential diagnoses here.  She has had flu, she likely has pna, she has chf and the more i see her the more i feel there may be a component of lung fibrosis (either ipf vs. amio toxicity).  She  had a ct chest 07/19/11. I would defer new Ct chest to primary M.D. And she does have an outpt. Pulmonology visit set up for early January 2013.  I would never restart amio on her.  Continue antibiotics.  D/c foley per nursing suggestion and she says she can help [place] keep up with ins and outs.  Okay to move to Floor bed from my perspective. Active Problems:  Hypertension-stable.  GERD (gastroesophageal reflux disease)-stable.  Atrial fibrillation-atrial pacing now so not in a-fib now.  Acute on chronic diastolic congestive heart failure-per cards and agree with holding lasix with hyponatremia.  PNA (pneumonia)-see above.   LOS: 4 days   Ezequiel Kayser, MD 09/17/2011, 12:24 PM

## 2011-09-17 NOTE — Progress Notes (Signed)
Patient ID: [name], female   DOB: June 15, 1925, 77 y.o.   MRN: [id number] SUBJECTIVE: [name] looks much better today sitting up in the bed. She denies any shortness of breath or pleuritic chest pain. Her input and output was negative again yesterday though I held her p.m. Lasix. 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 She's been afebrile. She is atrial paced  Filed Vitals:   09/17/11 0500 09/17/11 0734 09/17/11 0735 09/17/11 0800  BP:      Pulse:   64 62  Temp:      TempSrc:      Resp:   18   Height:      Weight: 67.1 kg (147 lb 14.9 oz)     SpO2:  96% 96% 98%    Intake/Output Summary (Last 24 hours) at 09/17/11 0858 Last data filed at 09/17/11 0600  Gross per 24 hour  Intake    933 ml  Output   1000 ml  Net    -67 ml    LABS: Basic Metabolic Panel:  Basename 09/17/11 0540 09/16/11 0600  NA 128* 132*  K 3.8 4.0  CL 85* 87*  CO2 39* 39*  GLUCOSE 120* 96  BUN 17 20  CREATININE 1.07 1.19*  CALCIUM 8.7 8.8  MG -- --  PHOS -- --   Liver Function Tests: No results found for this basename: AST:2,ALT:2,ALKPHOS:2,BILITOT:2,PROT:2,ALBUMIN:2 in the last 72 hours No results found for this basename: LIPASE:2,AMYLASE:2 in the last 72 hours CBC: No results found for this basename: WBC:2,NEUTROABS:2,HGB:2,HCT:2,MCV:2,PLT:2 in the last 72 hours Cardiac Enzymes:  Basename 09/16/11 0224 09/15/11 2137 09/15/11 1507  CKTOTAL 23 29 31   CKMB 1.3 1.4 1.6  CKMBINDEX -- -- --  TROPONINI <0.30 <0.30 <0.30   BNP: No components found with this basename: POCBNP:3 D-Dimer: No results found for this basename: DDIMER:2 in the last 72 hours Hemoglobin A1C: No results found for this basename: HGBA1C in the last 72 hours Fasting Lipid Panel: No results found for this basename: CHOL,HDL,LDLCALC,TRIG,CHOLHDL,LDLDIRECT in the last 72 hours Thyroid Function Tests: No results found for this basename: TSH,T4TOTAL,FREET3,T3FREE,THYROIDAB in the last 72  hours Anemia Panel: No results found for this basename: VITAMINB12,FOLATE,FERRITIN,TIBC,IRON,RETICCTPCT in the last 72 hours  RADIOLOGY: Dg Chest 2 View  08/24/2011  *RADIOLOGY REPORT*  Clinical Data: Follow-up lung nodule  CHEST - 2 VIEW  Comparison: 08/23/2011  Findings: Nipple markers were utilized.  1 cm nodular density in the right lung base does not correspond to the nipple marker.  This may be a lung nodule however there is scarring in the bases in this may be overlapping lung markings.  COPD.  Bibasilar scarring.  This is most prominent the bases.  No definite heart failure or pneumonia.  IMPRESSION: COPD with scarring.  1 cm right lower lobe nodule does not correspond to the nipple shadow.  Follow-up chest CT is suggested to rule out a lung nodule.  Original Report Authenticated By: [name], M.D.   Dg Chest 2 View  08/23/2011  *RADIOLOGY REPORT*  Clinical Data: Dyspnea.Shortness of breath.  Nonsmoker.  CHEST - 2 VIEW  Comparison: 07/24/2011 and 02/06/2011.  Findings: Slight improved aeration left lung.  Pulmonary vascular congestion most notable centrally superimposed on chronic changes.  1.2 cm nodular density right lower lobe.  PA view with nipple marker in place recommended.  Sequential pacemaker enters from the left with leads unchanged in the region of the right atrium and right ventricle.  Thoracic  compression deformities stable.  Calcified aorta.  IMPRESSION: Slight improved aeration left lung.  Pulmonary vascular congestion most notable centrally superimposed on chronic changes.  1.2 cm nodular density right lower lobe.  PA view with nipple marker in place recommended.  Original Report Authenticated By: [name], M.D.   Dg Chest Port 1 View  09/14/2011  *RADIOLOGY REPORT*  Clinical Data: Shortness of breath and respiratory distress  PORTABLE CHEST - 1 VIEW  Comparison: 09/13/2011  Findings: There is a left chest [name] pacer device with lead in the right atrial appendage and  right ventricle.  The heart size appears normal.  No significant change in the bilateral interstitial and airspace opacities.  IMPRESSION:  1.  No change in aeration the lungs compared with previous exam.  Original Report Authenticated By: [name], M.D.   Dg Chest Port 1 View  09/13/2011  *RADIOLOGY REPORT*  Clinical Data: Short of breath for 1 month.  Difficulty breathing today.  PORTABLE CHEST - 1 VIEW  Comparison: 08/24/2011.  Findings: Unchanged dual lead left subclavian cardiac pacemaker. Decreased lung volumes are present compared to prior.  There is bilateral perihilar and basilar predominant airspace disease compatible with pulmonary edema and CHF.  Cephalization of pulmonary blood flow is present with pulmonary vascular congestion. These changes are superimposed on chronic lung disease.  IMPRESSION: Moderate CHF.  Unchanged pacemaker apparatus.  Original Report Authenticated By: [name], M.D.    PHYSICAL EXAM General: Well developed, well nourished, in no acute distress, looks much better Head: Eyes PERRLA, No xanthomas.   Normal cephalic and atramatic  Lungs: Inspiratory expiratory rhonchi with decreased breath sounds in the bases particularly left lower lobe. Heart: HRRR S1 S2, with soft S4 murmur.  Pulses are 2+ & equal.            No carotid bruit. No JVD.  No abdominal bruits. No femoral bruits. Abdomen: Bowel sounds are positive, abdomen soft and non-tender without masses or                  Hernia's noted. Msk:  Back normal, normal gait. Normal strength and tone for age. Extremities: No clubbing, cyanosis or edema.  DP +1 Neuro: Alert and oriented X 3. Psych:  Good affect, responds appropriately  TELEMETRY: Reviewed telemetry pt in atrial paced.  ASSESSMENT AND PLAN:  Principal Problem:  *Dyspnea Active Problems:  Hypertension  GERD (gastroesophageal reflux disease)  Atrial fibrillation  Acute on chronic diastolic congestive heart failure  PNA  (pneumonia)  Her dyspnea has improved. I think her congestive heart failure has largely resolved. We'll hole a 6 again today with her sodium low. Continue antibiotics and incentive spirometry for pneumonia. I would not reinitiate amiodarone at this point. Discussed with husband. We'll also discuss with Dr. [place] when he returns. We'll check electrolytes in the morning. Increase activity.  Valera Castle, MD 09/17/2011 8:58 AM

## 2011-09-18 DIAGNOSIS — R0989 Other specified symptoms and signs involving the circulatory and respiratory systems: Secondary | ICD-10-CM

## 2011-09-18 DIAGNOSIS — R0609 Other forms of dyspnea: Secondary | ICD-10-CM

## 2011-09-18 DIAGNOSIS — J101 Influenza due to other identified influenza virus with other respiratory manifestations: Secondary | ICD-10-CM | POA: Diagnosis not present

## 2011-09-18 DIAGNOSIS — E871 Hypo-osmolality and hyponatremia: Secondary | ICD-10-CM | POA: Diagnosis not present

## 2011-09-18 LAB — BASIC METABOLIC PANEL
CO2: 36 mEq/L — ABNORMAL HIGH (ref 19–32)
Calcium: 8.8 mg/dL (ref 8.4–10.5)
Creatinine, Ser: 0.81 mg/dL (ref 0.50–1.10)
Glucose, Bld: 93 mg/dL (ref 70–99)

## 2011-09-18 MED ORDER — MOXIFLOXACIN HCL 400 MG PO TABS
400.0000 mg | ORAL_TABLET | Freq: Every day | ORAL | Status: DC
  Administered 2011-09-18 – 2011-09-20 (×3): 400 mg via ORAL
  Filled 2011-09-18 (×4): qty 1

## 2011-09-18 MED ORDER — FUROSEMIDE 40 MG PO TABS
40.0000 mg | ORAL_TABLET | Freq: Every day | ORAL | Status: DC
  Administered 2011-09-18 – 2011-09-21 (×4): 40 mg via ORAL
  Filled 2011-09-18 (×4): qty 1

## 2011-09-18 NOTE — Progress Notes (Signed)
Subjective: Feeling better.  Less shortness of breath and cough.  Fever resolved.  Swelling improved.  Objective: Vital signs in last 24 hours: Temp:  [97.9 F (36.6 C)-99.3 F (37.4 C)] 97.9 F (36.6 C) (12/31 0333) Pulse Rate:  [61-69] 65  (12/31 0335) Resp:  [18-20] 20  (12/31 0000) BP: (107-127)/(46-57) 114/51 mmHg (12/31 0335) SpO2:  [92 %-99 %] 96 % (12/31 0335) Weight:  [67 kg (147 lb 11.3 oz)] 147 lb 11.3 oz (67 kg) (12/31 0500) Weight change: -0.1 kg (-3.5 oz) Last BM Date: 09/17/11  CBG (last 3)  No results found for this basename: GLUCAP:3 in the last 72 hours  Intake/Output from previous day: 12/30 0701 - 12/31 0700 In: 463 [P.O.:240; I.V.:23; IV Piggyback:200] Out: 1291 [Urine:1290; Stool:1] Intake/Output this shift:    General appearance: chronically ill, fatigued Eyes: no scleral icterus Throat: oropharynx moist without erythema Resp: crackles 1/3 way up bilaterally, improved slightly Cardio: regular rate and rhythm, S1, S2 normal, no murmur, click, rub or gallop GI: soft, non-tender; bowel sounds normal; no masses,  no organomegaly Extremities: no clubbing, cyanosis; trace edema   Lab Results:  Basename 09/18/11 0538 09/17/11 0540  NA 126* 128*  K 4.4 3.8  CL 87* 85*  CO2 36* 39*  GLUCOSE 93 120*  BUN 12 17  CREATININE 0.81 1.07  CALCIUM 8.8 8.7  MG -- --  PHOS -- --   No results found for this basename: AST:2,ALT:2,ALKPHOS:2,BILITOT:2,PROT:2,ALBUMIN:2 in the last 72 hours No results found for this basename: WBC:2,NEUTROABS:2,HGB:2,HCT:2,MCV:2,PLT:2 in the last 72 hours Lab Results  Component Value Date   INR 2.19* 07/14/2011   INR 2.62* 02/09/2011   INR 3.63* 02/08/2011    Basename 09/16/11 0224 09/15/11 2137 09/15/11 1507  CKTOTAL 23 29 31   CKMB 1.3 1.4 1.6  CKMBINDEX -- -- --  TROPONINI <0.30 <0.30 <0.30   No results found for this basename: TSH,T4TOTAL,FREET3,T3FREE,THYROIDAB in the last 72 hours No results found for this basename:  VITAMINB12:2,FOLATE:2,FERRITIN:2,TIBC:2,IRON:2,RETICCTPCT:2 in the last 72 hours  Studies/Results: No results found.   Medications: Scheduled:   . albuterol  2.5 mg Nebulization Q6H  . amLODipine  10 mg Oral Daily  . antiseptic oral rinse  15 mL Mouth Rinse BID  . cefTRIAXone (ROCEPHIN)  IV  1 g Intravenous Q24H  . cholecalciferol  1,000 Units Oral Daily  . clindamycin (CLEOCIN) IV  300 mg Intravenous Q8H  . dabigatran  75 mg Oral Q12H  . divalproex  500 mg Oral QHS  . Flora-Q  1 capsule Oral Daily  . lisinopril  40 mg Oral BID  . metoprolol tartrate  25 mg Oral BID  . multivitamins ther. w/minerals  1 tablet Oral Daily  . olmesartan  40 mg Oral Daily  . oseltamivir  75 mg Oral BID  . pantoprazole  80 mg Oral Q1200  . potassium chloride  10 mEq Oral BID  . sodium chloride  3 mL Intravenous Q12H  . timolol  1 drop Both Eyes BID  . travoprost (benzalkonium)  1 drop Left Eye QHS  . DISCONTD: albuterol  2.5 mg Nebulization QID   Continuous:   Assessment/Plan: Principal Problem: 1. *Dyspnea- multifactorial due to Acute on Chronic CHF, Influenza/Pneumonia, and probable ILD.  Some improvement with diuresis and antibiotic therapy.  Scheduled for outpatient pulmonary evaluation- PFTs will be helpful once acute issues stabilize.  Active Problems: 2. PNA (pneumonia)- will transition to po Avelox to complete 7 days of antibiotics given clinical improvement. 3. Influenza A- continue Tamiflu  to continue 5 day course. 4. Acute on chronic diastolic congestive heart failure- Lasix held due to hyponatremia.  Volume status improved. 5. Hyponatremia- likely secondary to SIADH +/- diuretics.  Monitor. 6. Hypertension- stable.  She is on ARB and ACE inhibitor- does she need both? 7.  Atrial fibrillation- remains in NSR off Amiodarone.  Per Cards. 8. Dispo- OK to transfer to floor from my standpoint.  PT/OT evaluation.      LOS: 5 days   SHAW,W DOUGLAS 09/18/2011, 7:27 AM

## 2011-09-18 NOTE — Progress Notes (Signed)
Patient ID: [name], female   DOB: 1925-03-23, 77 y.o.   MRN: [id number] SUBJECTIVE: Patient is feeling better this morning with less shortness of breath. She denies any chest pain. Appetite is improving.  Her sodium has dropped further to 126. BUN and creatinine have actually improved to 12 and 0.8.  Dr. Clelia Croft of internal medicine if she is better from his perspective and can be transferred to the floor.  Filed Vitals:   09/18/11 0335 09/18/11 0500 09/18/11 0802 09/18/11 0818  BP: 114/51   135/65  Pulse: 65   79  Temp:    97.6 F (36.4 C)  TempSrc:    Oral  Resp:      Height:      Weight:  67 kg (147 lb 11.3 oz)    SpO2: 96%  97% 92%    Intake/Output Summary (Last 24 hours) at 09/18/11 0838 Last data filed at 09/18/11 0700  Gross per 24 hour  Intake    463 ml  Output   1291 ml  Net   -828 ml    LABS: Basic Metabolic Panel:  Basename 09/18/11 0538 09/17/11 0540  NA 126* 128*  K 4.4 3.8  CL 87* 85*  CO2 36* 39*  GLUCOSE 93 120*  BUN 12 17  CREATININE 0.81 1.07  CALCIUM 8.8 8.7  MG -- --  PHOS -- --   Liver Function Tests: No results found for this basename: AST:2,ALT:2,ALKPHOS:2,BILITOT:2,PROT:2,ALBUMIN:2 in the last 72 hours No results found for this basename: LIPASE:2,AMYLASE:2 in the last 72 hours CBC: No results found for this basename: WBC:2,NEUTROABS:2,HGB:2,HCT:2,MCV:2,PLT:2 in the last 72 hours Cardiac Enzymes:  Basename 09/16/11 0224 09/15/11 2137 09/15/11 1507  CKTOTAL 23 29 31   CKMB 1.3 1.4 1.6  CKMBINDEX -- -- --  TROPONINI <0.30 <0.30 <0.30   BNP: No components found with this basename: POCBNP:3 D-Dimer: No results found for this basename: DDIMER:2 in the last 72 hours Hemoglobin A1C: No results found for this basename: HGBA1C in the last 72 hours Fasting Lipid Panel: No results found for this basename: CHOL,HDL,LDLCALC,TRIG,CHOLHDL,LDLDIRECT in the last 72 hours Thyroid Function Tests: No results found for this basename:  TSH,T4TOTAL,FREET3,T3FREE,THYROIDAB in the last 72 hours Anemia Panel: No results found for this basename: VITAMINB12,FOLATE,FERRITIN,TIBC,IRON,RETICCTPCT in the last 72 hours  RADIOLOGY: Dg Chest 2 View  08/24/2011  *RADIOLOGY REPORT*  Clinical Data: Follow-up lung nodule  CHEST - 2 VIEW  Comparison: 08/23/2011  Findings: Nipple markers were utilized.  1 cm nodular density in the right lung base does not correspond to the nipple marker.  This may be a lung nodule however there is scarring in the bases in this may be overlapping lung markings.  COPD.  Bibasilar scarring.  This is most prominent the bases.  No definite heart failure or pneumonia.  IMPRESSION: COPD with scarring.  1 cm right lower lobe nodule does not correspond to the nipple shadow.  Follow-up chest CT is suggested to rule out a lung nodule.  Original Report Authenticated By: Camelia Phenes, M.D.   Dg Chest 2 View  08/23/2011  *RADIOLOGY REPORT*  Clinical Data: Dyspnea.Shortness of breath.  Nonsmoker.  CHEST - 2 VIEW  Comparison: 07/24/2011 and 02/06/2011.  Findings: Slight improved aeration left lung.  Pulmonary vascular congestion most notable centrally superimposed on chronic changes.  1.2 cm nodular density right lower lobe.  PA view with nipple marker in place recommended.  Sequential pacemaker enters from the left with leads unchanged in the region of the right atrium  and right ventricle.  Thoracic compression deformities stable.  Calcified aorta.  IMPRESSION: Slight improved aeration left lung.  Pulmonary vascular congestion most notable centrally superimposed on chronic changes.  1.2 cm nodular density right lower lobe.  PA view with nipple marker in place recommended.  Original Report Authenticated By: Fuller Canada, M.D.   Dg Chest Port 1 View  09/14/2011  *RADIOLOGY REPORT*  Clinical Data: Shortness of breath and respiratory distress  PORTABLE CHEST - 1 VIEW  Comparison: 09/13/2011  Findings: There is a left chest Cheri Ayotte pacer  device with lead in the right atrial appendage and right ventricle.  The heart size appears normal.  No significant change in the bilateral interstitial and airspace opacities.  IMPRESSION:  1.  No change in aeration the lungs compared with previous exam.  Original Report Authenticated By: Rosealee Albee, M.D.   Dg Chest Port 1 View  09/13/2011  *RADIOLOGY REPORT*  Clinical Data: Short of breath for 1 month.  Difficulty breathing today.  PORTABLE CHEST - 1 VIEW  Comparison: 08/24/2011.  Findings: Unchanged dual lead left subclavian cardiac pacemaker. Decreased lung volumes are present compared to prior.  There is bilateral perihilar and basilar predominant airspace disease compatible with pulmonary edema and CHF.  Cephalization of pulmonary blood flow is present with pulmonary vascular congestion. These changes are superimposed on chronic lung disease.  IMPRESSION: Moderate CHF.  Unchanged pacemaker apparatus.  Original Report Authenticated By: Andreas Newport, M.D.    PHYSICAL EXAM General: Well developed, well nourished, in no acute distress Head: Eyes PERRLA, No xanthomas.   Normal cephalic and atramatic  Lungs: Bibasilar crackles left greater than right, decreased breath sounds in the right lower lobe Heart: HRRR S1 S2, with soft S4 murmur.  Pulses are 2+ & equal.            No carotid bruit. No JVD.  No abdominal bruits. No femoral bruits. Abdomen: Bowel sounds are positive, abdomen soft and non-tender without masses or                  Hernia's noted. Msk:  Back normal, normal gait. Normal strength and tone for age. Extremities: No clubbing, cyanosis or edema.  DP +1 Neuro: Alert and oriented X 3. Psych:  Good affect, responds appropriately  TELEMETRY: Reviewed telemetry pt in atrial pacing  ASSESSMENT AND PLAN:  Principal Problem:  *Dyspnea Active Problems:  Hypertension  GERD (gastroesophageal reflux disease)  Atrial fibrillation  Acute on chronic diastolic congestive heart  failure  PNA (pneumonia)  Influenza A  Hyponatremia  [name] is better. Still concerned about her sodium. Her volume status seems to be increased today. She was on 40 mg of Lasix at home before being changed to 40 mg twice a day last week prior to admission. I will start with 40 mg by mouth daily. We'll arrange transfer to the floor. Will check electrolytes in the morning.  Valera Castle, MD 09/18/2011 8:38 AM

## 2011-09-18 NOTE — Progress Notes (Signed)
UR Completed.  [name] 09/18/2011 [phone]  

## 2011-09-19 DIAGNOSIS — J189 Pneumonia, unspecified organism: Secondary | ICD-10-CM | POA: Diagnosis not present

## 2011-09-19 DIAGNOSIS — K219 Gastro-esophageal reflux disease without esophagitis: Secondary | ICD-10-CM | POA: Diagnosis not present

## 2011-09-19 DIAGNOSIS — R0609 Other forms of dyspnea: Secondary | ICD-10-CM | POA: Diagnosis not present

## 2011-09-19 DIAGNOSIS — I10 Essential (primary) hypertension: Secondary | ICD-10-CM | POA: Diagnosis not present

## 2011-09-19 LAB — BASIC METABOLIC PANEL
BUN: 10 mg/dL (ref 6–23)
Calcium: 9.2 mg/dL (ref 8.4–10.5)
GFR calc non Af Amer: 59 mL/min — ABNORMAL LOW (ref >90)
Glucose, Bld: 92 mg/dL (ref 70–99)
Potassium: 4.4 mEq/L (ref 3.5–5.1)

## 2011-09-19 MED ORDER — ALBUTEROL SULFATE (5 MG/ML) 0.5% IN NEBU: 2.5000 mg | INHALATION_SOLUTION | RESPIRATORY_TRACT | Status: DC | PRN

## 2011-09-19 MED ORDER — DEXTROMETHORPHAN POLISTIREX 30 MG/5ML PO LQCR
30.0000 mg | Freq: Three times a day (TID) | ORAL | Status: DC
  Administered 2011-09-20 – 2011-09-21 (×4): 30 mg via ORAL
  Filled 2011-09-19 (×8): qty 5

## 2011-09-19 MED ORDER — ALBUTEROL SULFATE (5 MG/ML) 0.5% IN NEBU
2.5000 mg | INHALATION_SOLUTION | Freq: Four times a day (QID) | RESPIRATORY_TRACT | Status: DC
  Administered 2011-09-19 – 2011-09-20 (×5): 2.5 mg via RESPIRATORY_TRACT
  Filled 2011-09-19 (×5): qty 0.5

## 2011-09-19 NOTE — Progress Notes (Signed)
Subjective: Feeling better.  Breathing better.  Ambulated in hall with PT.  Persistent dry cough.  Edema resolved.  Objective: Vital signs in last 24 hours: Temp:  [97 F (36.1 C)-98.2 F (36.8 C)] 98.1 F (36.7 C) (01/01 0343) Pulse Rate:  [61-66] 61  (01/01 0305) Resp:  [15-20] 15  (01/01 0755) BP: (113-133)/(53-67) 133/65 mmHg (01/01 0755) SpO2:  [92 %-99 %] 97 % (01/01 0848) FiO2 (%):  [2 %] 2 % (12/31 1125) Weight:  [65.5 kg (144 lb 6.4 oz)] 144 lb 6.4 oz (65.5 kg) (01/01 0500) Weight change: -1.5 kg (-3 lb 4.9 oz) Last BM Date: 09/18/11  CBG (last 3)  No results found for this basename: GLUCAP:3 in the last 72 hours  Intake/Output from previous day: 12/31 0701 - 01/01 0700 In: 363 [P.O.:360; I.V.:3] Out: 2600 [Urine:2600] Intake/Output this shift:    General appearance: alert and looks much better, still fatigued Eyes: no scleral icterus Throat: oropharynx moist without erythema Resp: minimal bibasilar crackles, much improved Cardio: regular rate and rhythm, S1, S2 normal, no murmur, click, rub or gallop GI: soft, non-tender; bowel sounds normal; no masses,  no organomegaly Extremities: no clubbing, cyanosis; trace edema, improved  Lab Results:  Basename 09/19/11 0600 09/18/11 0538  NA 129* 126*  K 4.4 4.4  CL 88* 87*  CO2 35* 36*  GLUCOSE 92 93  BUN 10 12  CREATININE 0.88 0.81  CALCIUM 9.2 8.8  MG -- --  PHOS -- --    Studies/Results: No results found.   Medications: Scheduled:   . albuterol  2.5 mg Nebulization QID  . amLODipine  10 mg Oral Daily  . antiseptic oral rinse  15 mL Mouth Rinse BID  . cholecalciferol  1,000 Units Oral Daily  . dabigatran  75 mg Oral Q12H  . divalproex  500 mg Oral QHS  . Flora-Q  1 capsule Oral Daily  . furosemide  40 mg Oral Daily  . metoprolol tartrate  25 mg Oral BID  . moxifloxacin  400 mg Oral q1800  . multivitamins ther. w/minerals  1 tablet Oral Daily  . olmesartan  40 mg Oral Daily  . oseltamivir  75 mg  Oral BID  . pantoprazole  80 mg Oral Q1200  . potassium chloride  10 mEq Oral BID  . sodium chloride  3 mL Intravenous Q12H  . timolol  1 drop Both Eyes BID  . travoprost (benzalkonium)  1 drop Left Eye QHS  . DISCONTD: albuterol  2.5 mg Nebulization Q6H  . DISCONTD: lisinopril  40 mg Oral BID   Continuous:   Assessment/Plan: Principal Problem: 1. *Dyspnea- much improved.  Continue diuresis per Cards, antibiotics and Tamiflu.  Consider PFTs once stable to evaluate for ILD/Amiodarone lung toxicity.  Active Problems: 2. CHF secondary to diastolic dysfunction- continue diuretics, medical therapy per Cardiology. 3. PNA (pneumonia)- continue Avelox to complete 7 day course of antibiotics.  Improved. 4. Influenza A- continue Tamiflu to complete 5 day course.  Improved. 5. Hyponatremia- improving despite restarting diuretics consistent with SIADH.  Monitor. 6. Hypertension- stable.  She has been on Azor and Lisinopril at home.  Not sure she needs both ACE/ARB but will continue for now unless Cards feels otherwise. 7. Atrial fibrillation- continue treatment per Cards. 8. Dispo- anticipate transfer to telemtry today.  Continue PT/OT.  Hopeful for discharge by the end of the week to home.       LOS: 6 days   Maile Linford,W DOUGLAS 09/19/2011, 10:07 AM

## 2011-09-19 NOTE — Progress Notes (Signed)
Pt. With order to transfer to 4700. V/S stable, no c/o made , report given to [facility]. Transferred.

## 2011-09-19 NOTE — Progress Notes (Signed)
Physical Therapy Evaluation Patient Details Name: [name] MRN: [id number] DOB: 03-24-1925 Today's Date: 09/19/2011  Problem List:  Patient Active Problem List  Diagnoses  . Dyspnea  . Pneumonia  . Hypertension  . Renal artery stenosis  . GERD (gastroesophageal reflux disease)  . Back pain  . Campath-induced atrial fibrillation  . Tachycardia-bradycardia  . Diastolic CHF, chronic  . Anticoagulant long-term use  . Dysphagia  . Acute respiratory failure with hypoxia  . PNA (pneumonia)  . Influenza A  . Hyponatremia    Past Medical History:  Past Medical History  Diagnosis Date  . Pneumonia 04/2010  . Hypertension   . Renal artery stenosis 2008    STATUS POST ANGIOPLASTY  . GERD (gastroesophageal reflux disease)   . Back pain     CHRONIC  . Campath-induced atrial fibrillation     Prior TEE/CV in May 2012  . Hyponatremia     improved  . Seizure disorder     on anti-epileptic therapy  . Urinary incontinence   . Osteopenia   . SIADH (syndrome of inappropriate ADH production)     h/o HYPONATREMIA ; LIKELY RELATED TO HER LUNG PROCESS  . Arthritis   . Hiatal hernia   . Hiatal hernia   . Tachy-brady syndrome     with pauses noted in May 2012  . Dysphagia    Past Surgical History:  Past Surgical History  Procedure Date  . Cholecystectomy   . Back surgery     X2  . Total knee arthroplasty     RIGHT AND LEFT KNEE  . Tonsillectomy   . Appendectomy   . Cardiac catheterization 2007    Normal  . Knee arthroscopy     LEFT KNEE  . Cataract extraction   . Shoulder surgery   . Renal angioplasty 01/2007    RIGHT  . Partial hysterectomy   . Insert / replace / remove pacemaker 06/2011  . Eye surgery     cataracts removed on both eyes    PT Assessment/Plan/Recommendation PT Assessment Clinical Impression Statement: pt presents with the flu.  pt very motivated to improve mobility and return home.  Feel pt would benefit from HHPT pending progress while in hospital  and LOS.   PT Recommendation/Assessment: Patient will need skilled PT in the acute care venue PT Problem List: Decreased activity tolerance;Decreased balance;Decreased mobility;Decreased knowledge of use of DME;Cardiopulmonary status limiting activity Barriers to Discharge: None PT Therapy Diagnosis : Difficulty walking (Decondition/Debility) PT Plan PT Frequency: Min 3X/week PT Treatment/Interventions: DME instruction;Gait training;Stair training;Functional mobility training;Therapeutic activities;Therapeutic exercise;Balance training;Patient/family education PT Recommendation Recommendations for Other Services: OT consult Follow Up Recommendations: Home health PT Equipment Recommended: None recommended by PT PT Goals  Acute Rehab PT Goals PT Goal Formulation: With patient Time For Goal Achievement: 2 weeks Pt will go Supine/Side to Sit: Independently PT Goal: Supine/Side to Sit - Progress: Not met Pt will go Sit to Supine/Side: Independently PT Goal: Sit to Supine/Side - Progress: Not met Pt will go Sit to Stand: with modified independence PT Goal: Sit to Stand - Progress: Not met Pt will Ambulate: >150 feet;with modified independence;with least restrictive assistive device PT Goal: Ambulate - Progress: Not met Pt will Go Up / Down Stairs: 1-2 stairs;with supervision;with rail(s) PT Goal: Up/Down Stairs - Progress: Not met  PT Evaluation Precautions/Restrictions  Precautions Precautions:  (Droplet) Restrictions Weight Bearing Restrictions: No Prior Functioning  Home Living Lives With: Spouse Receives Help From: Family Type of Home: House Home Layout:  One level Home Access: Stairs to enter [employer] of Steps: 2 Home Adaptive Equipment: Dan Humphreys - four wheeled;Straight cane Prior Function Level of Independence: Independent with basic ADLs;Independent with homemaking with ambulation;Independent with gait;Independent with transfers;Requires assistive device for  independence Able to Take Stairs?: Yes Cognition   Sensation/Coordination   Extremity Assessment RLE Assessment RLE Assessment: Within Functional Limits LLE Assessment LLE Assessment: Within Functional Limits Mobility (including Balance) Bed Mobility Bed Mobility: No Transfers Transfers: Yes Sit to Stand: 5: Supervision;With upper extremity assist;From chair/3-in-1 Sit to Stand Details (indicate cue type and reason): demos good use of armrests.   Stand to Sit: 5: Supervision;With upper extremity assist;With armrests;To chair/3-in-1 Stand to Sit Details: cues to get closer to chair prior to sitting Ambulation/Gait Ambulation/Gait: Yes Ambulation/Gait Assistance: 4: Min assist Ambulation/Gait Assistance Details (indicate cue type and reason): cues for deep breathing, encouragement.   Ambulation Distance (Feet): 120 Feet Assistive device: Rolling walker Gait Pattern: Decreased stride length;Shuffle Gait velocity: pt notes she always ambulates slowly.   Stairs: No Corporate treasurer: No    Exercise    End of Session PT - End of Session Equipment Utilized During Treatment: Gait belt Activity Tolerance: Patient limited by fatigue Patient left: in chair;with call bell in reach;with family/visitor present Nurse Communication: Mobility status for transfers General Behavior During Session: [facility] for tasks performed Cognition: [facility] for tasks performed  Sunny Schlein, [place] [phone] 09/19/2011, 3:04 PM

## 2011-09-19 NOTE — Progress Notes (Signed)
Patient ID: [name], female   DOB: 10-Jun-1925, 78 y.o.   MRN: [id number] SUBJECTIVE: [name] is much better this morning. [name] sitting in the chair is less short of breath. He is going back and forth to the bathroom. [name] diuresed well yesterday which is 40 mg of by mouth Lasix. Sodium is increased a little in her BUN and creatinine are stable. [name] is ready for transfer to the floor.    Filed Vitals:   09/19/11 0343 09/19/11 0500 09/19/11 0755 09/19/11 0848  BP:   133/65   Pulse:      Temp: 98.1 F (36.7 C)     TempSrc: Oral     Resp:   15   Height:      Weight:  65.5 kg (144 lb 6.4 oz)    SpO2:    97%    Intake/Output Summary (Last 24 hours) at 09/19/11 0930 Last data filed at 09/19/11 0600  Gross per 24 hour  Intake    363 ml  Output   2600 ml  Net  -2237 ml    LABS: Basic Metabolic Panel:  Basename 09/19/11 0600 09/18/11 0538  NA 129* 126*  K 4.4 4.4  CL 88* 87*  CO2 35* 36*  GLUCOSE 92 93  BUN 10 12  CREATININE 0.88 0.81  CALCIUM 9.2 8.8  MG -- --  PHOS -- --   Liver Function Tests: No results found for this basename: AST:2,ALT:2,ALKPHOS:2,BILITOT:2,PROT:2,ALBUMIN:2 in the last 72 hours No results found for this basename: LIPASE:2,AMYLASE:2 in the last 72 hours CBC: No results found for this basename: WBC:2,NEUTROABS:2,HGB:2,HCT:2,MCV:2,PLT:2 in the last 72 hours Cardiac Enzymes: No results found for this basename: CKTOTAL:3,CKMB:3,CKMBINDEX:3,TROPONINI:3 in the last 72 hours BNP: No components found with this basename: POCBNP:3 D-Dimer: No results found for this basename: DDIMER:2 in the last 72 hours Hemoglobin A1C: No results found for this basename: HGBA1C in the last 72 hours Fasting Lipid Panel: No results found for this basename: CHOL,HDL,LDLCALC,TRIG,CHOLHDL,LDLDIRECT in the last 72 hours Thyroid Function Tests: No results found for this basename: TSH,T4TOTAL,FREET3,T3FREE,THYROIDAB in the last 72 hours Anemia Panel: No results found for  this basename: VITAMINB12,FOLATE,FERRITIN,TIBC,IRON,RETICCTPCT in the last 72 hours  RADIOLOGY: Dg Chest 2 View  08/24/2011  *RADIOLOGY REPORT*  Clinical Data: Follow-up lung nodule  CHEST - 2 VIEW  Comparison: 08/23/2011  Findings: Nipple markers were utilized.  1 cm nodular density in the right lung base does not correspond to the nipple marker.  This may be a lung nodule however there is scarring in the bases in this may be overlapping lung markings.  COPD.  Bibasilar scarring.  This is most prominent the bases.  No definite heart failure or pneumonia.  IMPRESSION: COPD with scarring.  1 cm right lower lobe nodule does not correspond to the nipple shadow.  Follow-up chest CT is suggested to rule out a lung nodule.  Original Report Authenticated By: Camelia Phenes, M.D.   Dg Chest 2 View  08/23/2011  *RADIOLOGY REPORT*  Clinical Data: Dyspnea.Shortness of breath.  Nonsmoker.  CHEST - 2 VIEW  Comparison: 07/24/2011 and 02/06/2011.  Findings: Slight improved aeration left lung.  Pulmonary vascular congestion most notable centrally superimposed on chronic changes.  1.2 cm nodular density right lower lobe.  PA view with nipple marker in place recommended.  Sequential pacemaker enters from the left with leads unchanged in the region of the right atrium and right ventricle.  Thoracic compression deformities stable.  Calcified aorta.  IMPRESSION: Slight improved aeration  left lung.  Pulmonary vascular congestion most notable centrally superimposed on chronic changes.  1.2 cm nodular density right lower lobe.  PA view with nipple marker in place recommended.  Original Report Authenticated By: Fuller Canada, M.D.   Dg Chest Port 1 View  09/14/2011  *RADIOLOGY REPORT*  Clinical Data: Shortness of breath and respiratory distress  PORTABLE CHEST - 1 VIEW  Comparison: 09/13/2011  Findings: There is a left chest Shelsea [employer]artner pacer device with lead in the right atrial appendage and right ventricle.  The heart size appears  normal.  No significant change in the bilateral interstitial and airspace opacities.  IMPRESSION:  1.  No change in aeration the lungs compared with previous exam.  Original Report Authenticated By: Rosealee Albee, M.D.   Dg Chest Port 1 View  09/13/2011  *RADIOLOGY REPORT*  Clinical Data: Short of breath for 1 month.  Difficulty breathing today.  PORTABLE CHEST - 1 VIEW  Comparison: 08/24/2011.  Findings: Unchanged dual lead left subclavian cardiac pacemaker. Decreased lung volumes are present compared to prior.  There is bilateral perihilar and basilar predominant airspace disease compatible with pulmonary edema and CHF.  Cephalization of pulmonary blood flow is present with pulmonary vascular congestion. These changes are superimposed on chronic lung disease.  IMPRESSION: Moderate CHF.  Unchanged pacemaker apparatus.  Original Report Authenticated By: Andreas Newport, M.D.    PHYSICAL EXAM General: Well developed, well nourished, in no acute distress Head: Eyes PERRLA, No xanthomas.   Normal cephalic and atramatic  Lungs: Improved breath sounds with decreased wheezing and rhonchi. Crackles in the left base. Consolidation findings in both lungs. Heart: HRRR S1 S2, with soft S4 murmur.  Pulses are 2+ & equal.            No carotid bruit. No JVD.  No abdominal bruits. No femoral bruits. Abdomen: Bowel sounds are positive, abdomen soft and non-tender without masses or                  Hernia's noted. Msk:  Back normal, normal gait. Normal strength and tone for age. Extremities: No clubbing, cyanosis or edema.  DP +1 Neuro: Alert and oriented X 3. Psych:  Good affect, responds appropriately  TELEMETRY: Reviewed telemetry pt in atrial pacing.  ASSESSMENT AND PLAN:  Principal Problem:  *Dyspnea Active Problems:  Hypertension  GERD (gastroesophageal reflux disease)  Campath-induced atrial fibrillation  PNA (pneumonia)  Influenza A  Hyponatremia  [name] is doing much better. I have  made no changes in her medical therapy today but will transfer her to telemetry. Increase activity as tolerated. We'll check electrolytes again in the morning.  Valera Castle, MD 09/19/2011 9:30 AM

## 2011-09-20 DIAGNOSIS — R0609 Other forms of dyspnea: Secondary | ICD-10-CM

## 2011-09-20 DIAGNOSIS — J189 Pneumonia, unspecified organism: Secondary | ICD-10-CM | POA: Diagnosis not present

## 2011-09-20 DIAGNOSIS — K219 Gastro-esophageal reflux disease without esophagitis: Secondary | ICD-10-CM | POA: Diagnosis not present

## 2011-09-20 DIAGNOSIS — I10 Essential (primary) hypertension: Secondary | ICD-10-CM | POA: Diagnosis not present

## 2011-09-20 DIAGNOSIS — R0989 Other specified symptoms and signs involving the circulatory and respiratory systems: Secondary | ICD-10-CM | POA: Diagnosis not present

## 2011-09-20 LAB — BASIC METABOLIC PANEL
BUN: 9 mg/dL (ref 6–23)
Chloride: 89 mEq/L — ABNORMAL LOW (ref 96–112)
Creatinine, Ser: 0.94 mg/dL (ref 0.50–1.10)
GFR calc Af Amer: 63 mL/min — ABNORMAL LOW (ref >90)
Glucose, Bld: 93 mg/dL (ref 70–99)

## 2011-09-20 NOTE — Progress Notes (Signed)
Occupational Therapy Evaluation Patient Details Name: [name] MRN: [id number] DOB: 1924-10-13 Today's Date: 09/20/2011  Problem List:  Patient Active Problem List  Diagnoses  . Dyspnea  . Pneumonia  . Hypertension  . Renal artery stenosis  . GERD (gastroesophageal reflux disease)  . Back pain  . Campath-induced atrial fibrillation  . Tachycardia-bradycardia  . Diastolic CHF, chronic  . Anticoagulant long-term use  . Dysphagia  . Acute respiratory failure with hypoxia  . PNA (pneumonia)  . Influenza A  . Hyponatremia    Past Medical History:  Past Medical History  Diagnosis Date  . Pneumonia 04/2010  . Hypertension   . Renal artery stenosis 2008    STATUS POST ANGIOPLASTY  . GERD (gastroesophageal reflux disease)   . Back pain     CHRONIC  . Campath-induced atrial fibrillation     Prior TEE/CV in May 2012  . Hyponatremia     improved  . Seizure disorder     on anti-epileptic therapy  . Urinary incontinence   . Osteopenia   . SIADH (syndrome of inappropriate ADH production)     h/o HYPONATREMIA ; LIKELY RELATED TO HER LUNG PROCESS  . Arthritis   . Hiatal hernia   . Hiatal hernia   . Tachy-brady syndrome     with pauses noted in May 2012  . Dysphagia    Past Surgical History:  Past Surgical History  Procedure Date  . Cholecystectomy   . Back surgery     X2  . Total knee arthroplasty     RIGHT AND LEFT KNEE  . Tonsillectomy   . Appendectomy   . Cardiac catheterization 2007    Normal  . Knee arthroscopy     LEFT KNEE  . Cataract extraction   . Shoulder surgery   . Renal angioplasty 01/2007    RIGHT  . Partial hysterectomy   . Insert / replace / remove pacemaker 06/2011  . Eye surgery     cataracts removed on both eyes    OT Assessment/Plan/Recommendation OT Assessment Clinical Impression Statement: Pt is an 78 year old woman recovering from flu and PNA with HF history.  Pt is at a supervision level in mobility and set up for ADL.  Will have  husband available for assist as needed at home.  No DME or further OT needs. OT Recommendation/Assessment: Patient does not need any further OT services OT Recommendation Equipment Recommended: None recommended by OT OT Evaluation Precautions/Restrictions  Precautions Precautions:  (Droplet) Restrictions Weight Bearing Restrictions: No Prior Functioning Home Living Lives With: Spouse Receives Help From: Family Type of Home: House Home Layout: One level Home Access: Stairs to enter [employer] of Steps: 2 Bathroom Shower/Tub: Psychologist, counselling;Door Bathroom Toilet: Standard Home Adaptive Equipment: Dan Humphreys - four wheeled;Straight cane Prior Function Level of Independence: Independent with basic ADLs;Independent with homemaking with ambulation;Independent with gait;Independent with transfers;Requires assistive device for independence Able to Take Stairs?: Yes ADL ADL Eating/Feeding: Performed;Independent Where Assessed - Eating/Feeding: Edge of bed Grooming: Performed;Supervision/safety;Wash/dry hands Where Assessed - Grooming: Standing at sink Upper Body Bathing: Simulated;Set up Where Assessed - Upper Body Bathing: Sitting, bed Lower Body Bathing: Simulated;Set up Where Assessed - Lower Body Bathing: Sit to stand from bed;Sitting, bed Upper Body Dressing: Simulated;Set up Where Assessed - Upper Body Dressing: Sitting, bed Lower Body Dressing: Performed;Set up Where Assessed - Lower Body Dressing: Sitting, bed Toilet Transfer: Simulated;Supervision/safety Toilet Transfer Method: Ambulating Toileting - Hygiene: Simulated;Independent ADL Comments: Pt primarily limited by fatigue.  Recommended  use of pt's plastic porch chairs as shower seat to conserve energy.  Instructed in energy conservation techniques. Vision/Perception  Vision - History Baseline Vision: Wears glasses only for reading Patient Visual Report: No change from  baseline Cognition Cognition Arousal/Alertness: Awake/alert Overall Cognitive Status: Appears within functional limits for tasks assessed Sensation/Coordination Sensation Light Touch: Appears Intact Hot/Cold: Appears Intact Proprioception: Appears Intact Coordination Gross Motor Movements are Fluid and Coordinated: Yes Fine Motor Movements are Fluid and Coordinated: Yes Extremity Assessment RUE Assessment RUE Assessment: Within Functional Limits LUE Assessment LUE Assessment: Within Functional Limits Mobility  Bed Mobility Bed Mobility: Yes (Mod I, HOB up 30 degrees, use of rail) Transfers Sit to Stand: 5: Supervision;With upper extremity assist;From chair/3-in-1 Stand to Sit: 5: Supervision;With upper extremity assist;With armrests;To chair/3-in-1 Exercises   End of Session OT - End of Session Activity Tolerance: Patient limited by fatigue Patient left: in bed;with call bell in reach;with family/visitor present General Behavior During Session: [facility] for tasks performed Cognition: [facility] for tasks performed   Evern Bio 09/20/2011, 8:38 AM  [phone]

## 2011-09-20 NOTE — Progress Notes (Signed)
UR Completed.  [name] 09/20/2011 [phone]  

## 2011-09-20 NOTE — Progress Notes (Signed)
Subjective: Continues to improve with less shortness of breath and cough.  Diuresing well.  Objective: Vital signs in last 24 hours: Temp:  [97.5 F (36.4 C)-98.1 F (36.7 C)] 98.1 F (36.7 C) (01/02 0640) Pulse Rate:  [57-64] 64  (01/02 0640) Resp:  [15-18] 18  (01/02 0640) BP: (111-123)/(52-66) 123/66 mmHg (01/02 0640) SpO2:  [95 %-100 %] 95 % (01/02 0755) Weight:  [63.504 kg (140 lb)] 140 lb (63.504 kg) (01/02 0640) Weight change: -1.996 kg (-4 lb 6.4 oz) Last BM Date: 09/18/11  CBG (last 3)  No results found for this basename: GLUCAP:3 in the last 72 hours  Intake/Output from previous day: 01/01 0701 - 01/02 0700 In: 773 [P.O.:770; I.V.:3] Out: 500 [Urine:500] Intake/Output this shift:    General appearance: alert and looks stronger, no acute distress Eyes: no scleral icterus Throat: oropharynx moist without erythema Resp: very minimal crackles in bilateral bases Cardio: regular rate and rhythm, S1, S2 normal, no murmur, click, rub or gallop GI: soft, non-tender; bowel sounds normal; no masses,  no organomegaly Extremities: no clubbing, cyanosis; trace edema  Lab Results:  Basename 09/20/11 0513 09/19/11 0600  NA 129* 129*  K 4.3 4.4  CL 89* 88*  CO2 35* 35*  GLUCOSE 93 92  BUN 9 10  CREATININE 0.94 0.88  CALCIUM 9.2 9.2  MG -- --  PHOS -- --   No results found for this basename: AST:2,ALT:2,ALKPHOS:2,BILITOT:2,PROT:2,ALBUMIN:2 in the last 72 hours No results found for this basename: WBC:2,NEUTROABS:2,HGB:2,HCT:2,MCV:2,PLT:2 in the last 72 hours No results found for this basename: CKTOTAL:3,CKMB:3,CKMBINDEX:3,TROPONINI:3 in the last 72 hours No results found for this basename: TSH,T4TOTAL,FREET3,T3FREE,THYROIDAB in the last 72 hours No results found for this basename: VITAMINB12:2,FOLATE:2,FERRITIN:2,TIBC:2,IRON:2,RETICCTPCT:2 in the last 72 hours  Studies/Results: No results found.   Medications: Scheduled:   . albuterol  2.5 mg Nebulization QID  .  amLODipine  10 mg Oral Daily  . antiseptic oral rinse  15 mL Mouth Rinse BID  . cholecalciferol  1,000 Units Oral Daily  . dabigatran  75 mg Oral Q12H  . dextromethorphan  30 mg Oral TID  . divalproex  500 mg Oral QHS  . Flora-Q  1 capsule Oral Daily  . furosemide  40 mg Oral Daily  . metoprolol tartrate  25 mg Oral BID  . moxifloxacin  400 mg Oral q1800  . multivitamins ther. w/minerals  1 tablet Oral Daily  . olmesartan  40 mg Oral Daily  . oseltamivir  75 mg Oral BID  . pantoprazole  80 mg Oral Q1200  . potassium chloride  10 mEq Oral BID  . sodium chloride  3 mL Intravenous Q12H  . timolol  1 drop Both Eyes BID  . travoprost (benzalkonium)  1 drop Left Eye QHS   Continuous:   Assessment/Plan: Principal Problem: 1. *Dyspnea- improving with diuresis and antibiotics.  Wean O2 to off as tolerated.  PFTs/pulmonary follow-up as outpatient Active Problems: 2. PNA (pneumonia)- continue Avelox through 1/4 to complete 7 days of antibiotics.  Improved. 3. Influenza A- complete Tamiflu 5 day course today. 4. Hyponatremia- stable despite diuresis.  Monitor. 5. Hypertension- stable. She has been on Azor and Lisinopril at home. Not sure she needs both ACE/ARB but will continue for now unless Cards feels otherwise. 7. Atrial fibrillation- continue treatment per Cards.  8. Dispo- Continue PT/OT. Wean O2 to off. Should be stable for discharge in next 1-2 days from my standpoint- defer to Cardiology.   LOS: 7 days   SHAW,W DOUGLAS 09/20/2011,  7:58 AM

## 2011-09-20 NOTE — Progress Notes (Signed)
Patient ID: [name], female   DOB: 10/13/1924, 78 y.o.   MRN: [id number] SUBJECTIVE: Feels much better. No CP, SOB, palpitations. Weaning off 02.  Filed Vitals:   09/19/11 2147 09/20/11 0126 09/20/11 0640 09/20/11 0755  BP: 121/64 111/65 123/66   Pulse: 57 62 64   Temp:  97.5 F (36.4 C) 98.1 F (36.7 C)   TempSrc:  Oral Oral   Resp:  18 18   Height:      Weight:   63.504 kg (140 lb)   SpO2:  96% 98% 95%    Intake/Output Summary (Last 24 hours) at 09/20/11 0838 Last data filed at 09/19/11 2200  Gross per 24 hour  Intake    773 ml  Output    500 ml  Net    273 ml    LABS: Basic Metabolic Panel:  Basename 09/20/11 0513 09/19/11 0600  NA 129* 129*  K 4.3 4.4  CL 89* 88*  CO2 35* 35*  GLUCOSE 93 92  BUN 9 10  CREATININE 0.94 0.88  CALCIUM 9.2 9.2  MG -- --  PHOS -- --   Liver Function Tests: No results found for this basename: AST:2,ALT:2,ALKPHOS:2,BILITOT:2,PROT:2,ALBUMIN:2 in the last 72 hours No results found for this basename: LIPASE:2,AMYLASE:2 in the last 72 hours CBC: No results found for this basename: WBC:2,NEUTROABS:2,HGB:2,HCT:2,MCV:2,PLT:2 in the last 72 hours Cardiac Enzymes: No results found for this basename: CKTOTAL:3,CKMB:3,CKMBINDEX:3,TROPONINI:3 in the last 72 hours BNP: No components found with this basename: POCBNP:3 D-Dimer: No results found for this basename: DDIMER:2 in the last 72 hours Hemoglobin A1C: No results found for this basename: HGBA1C in the last 72 hours Fasting Lipid Panel: No results found for this basename: CHOL,HDL,LDLCALC,TRIG,CHOLHDL,LDLDIRECT in the last 72 hours Thyroid Function Tests: No results found for this basename: TSH,T4TOTAL,FREET3,T3FREE,THYROIDAB in the last 72 hours Anemia Panel: No results found for this basename: VITAMINB12,FOLATE,FERRITIN,TIBC,IRON,RETICCTPCT in the last 72 hours  RADIOLOGY: Dg Chest 2 View  08/24/2011  *RADIOLOGY REPORT*  Clinical Data: Follow-up lung nodule  CHEST - 2 VIEW   Comparison: 08/23/2011  Findings: Nipple markers were utilized.  1 cm nodular density in the right lung base does not correspond to the nipple marker.  This may be a lung nodule however there is scarring in the bases in this may be overlapping lung markings.  COPD.  Bibasilar scarring.  This is most prominent the bases.  No definite heart failure or pneumonia.  IMPRESSION: COPD with scarring.  1 cm right lower lobe nodule does not correspond to the nipple shadow.  Follow-up chest CT is suggested to rule out a lung nodule.  Original Report Authenticated By: Camelia Phenes, M.D.   Dg Chest 2 View  08/23/2011  *RADIOLOGY REPORT*  Clinical Data: Dyspnea.Shortness of breath.  Nonsmoker.  CHEST - 2 VIEW  Comparison: 07/24/2011 and 02/06/2011.  Findings: Slight improved aeration left lung.  Pulmonary vascular congestion most notable centrally superimposed on chronic changes.  1.2 cm nodular density right lower lobe.  PA view with nipple marker in place recommended.  Sequential pacemaker enters from the left with leads unchanged in the region of the right atrium and right ventricle.  Thoracic compression deformities stable.  Calcified aorta.  IMPRESSION: Slight improved aeration left lung.  Pulmonary vascular congestion most notable centrally superimposed on chronic changes.  1.2 cm nodular density right lower lobe.  PA view with nipple marker in place recommended.  Original Report Authenticated By: Fuller Canada, M.D.   Dg Chest [facility]  09/14/2011  *RADIOLOGY REPORT*  Clinical Data: Shortness of breath and respiratory distress  PORTABLE CHEST - 1 VIEW  Comparison: 09/13/2011  Findings: There is a left chest Michae Grimley pacer device with lead in the right atrial appendage and right ventricle.  The heart size appears normal.  No significant change in the bilateral interstitial and airspace opacities.  IMPRESSION:  1.  No change in aeration the lungs compared with previous exam.  Original Report Authenticated By:  Rosealee Albee, M.D.   Dg Chest Port 1 View  09/13/2011  *RADIOLOGY REPORT*  Clinical Data: Short of breath for 1 month.  Difficulty breathing today.  PORTABLE CHEST - 1 VIEW  Comparison: 08/24/2011.  Findings: Unchanged dual lead left subclavian cardiac pacemaker. Decreased lung volumes are present compared to prior.  There is bilateral perihilar and basilar predominant airspace disease compatible with pulmonary edema and CHF.  Cephalization of pulmonary blood flow is present with pulmonary vascular congestion. These changes are superimposed on chronic lung disease.  IMPRESSION: Moderate CHF.  Unchanged pacemaker apparatus.  Original Report Authenticated By: Andreas Newport, M.D.    PHYSICAL EXAM No change except improved breath sounds throughout.  TELEMETRY: Reviewed telemetry pt in Atrial pacing. ASSESSMENT AND PLAN:  Principal Problem:  *Dyspnea Active Problems:  Hypertension  GERD (gastroesophageal reflux disease)  [place]ath-induced atrial fibrillation  PNA (pneumonia)  Influenza A  Hyponatremia  Improving. Holding NSR with a pacing. CHF resolved. Home soon.  Valera Castle, MD 09/20/2011 8:38 AM

## 2011-09-21 DIAGNOSIS — K219 Gastro-esophageal reflux disease without esophagitis: Secondary | ICD-10-CM | POA: Diagnosis not present

## 2011-09-21 DIAGNOSIS — J189 Pneumonia, unspecified organism: Secondary | ICD-10-CM | POA: Diagnosis not present

## 2011-09-21 DIAGNOSIS — R0609 Other forms of dyspnea: Secondary | ICD-10-CM

## 2011-09-21 DIAGNOSIS — E876 Hypokalemia: Secondary | ICD-10-CM

## 2011-09-21 DIAGNOSIS — I10 Essential (primary) hypertension: Secondary | ICD-10-CM | POA: Diagnosis not present

## 2011-09-21 DIAGNOSIS — R0989 Other specified symptoms and signs involving the circulatory and respiratory systems: Secondary | ICD-10-CM | POA: Diagnosis not present

## 2011-09-21 LAB — CULTURE, BLOOD (ROUTINE X 2)
Culture  Setup Time: 201212280856
Culture: NO GROWTH

## 2011-09-21 LAB — BASIC METABOLIC PANEL
BUN: 12 mg/dL (ref 6–23)
CO2: 31 mEq/L (ref 19–32)
Chloride: 90 mEq/L — ABNORMAL LOW (ref 96–112)
Creatinine, Ser: 1.05 mg/dL (ref 0.50–1.10)
Glucose, Bld: 118 mg/dL — ABNORMAL HIGH (ref 70–99)

## 2011-09-21 MED ORDER — FUROSEMIDE 40 MG PO TABS: 40.0000 mg | ORAL_TABLET | Freq: Every day | ORAL | Status: DC

## 2011-09-21 MED ORDER — MOXIFLOXACIN HCL 400 MG PO TABS: 400.0000 mg | ORAL_TABLET | Freq: Every day | ORAL | Status: AC

## 2011-09-21 MED ORDER — ALBUTEROL SULFATE (2.5 MG/3ML) 0.083% IN NEBU: 2.5000 mg | INHALATION_SOLUTION | Freq: Four times a day (QID) | RESPIRATORY_TRACT | Status: DC | PRN

## 2011-09-21 MED ORDER — AMLODIPINE BESYLATE 10 MG PO TABS: 10.0000 mg | ORAL_TABLET | Freq: Every day | ORAL | Status: DC

## 2011-09-21 MED ORDER — OLMESARTAN MEDOXOMIL 40 MG PO TABS: 40.0000 mg | ORAL_TABLET | Freq: Every day | ORAL | Status: DC

## 2011-09-21 MED ORDER — POTASSIUM CHLORIDE CRYS ER 10 MEQ PO TBCR: 10.0000 meq | EXTENDED_RELEASE_TABLET | Freq: Two times a day (BID) | ORAL | Status: DC

## 2011-09-21 MED ORDER — DM-GUAIFENESIN ER 30-600 MG PO TB12: 1.0000 | ORAL_TABLET | Freq: Two times a day (BID) | ORAL | Status: AC | PRN

## 2011-09-21 NOTE — Progress Notes (Signed)
   CARE MANAGEMENT NOTE HEART FAILURE  09/21/2011   Patient:  [name], [name]   Account Number:  [id number]    Date Initiated:  09/14/2011  Documentation initiated by:  Shannan Harper  Subjective/Objective Assessment:   Patient admitted with progressively worsening SOB.  Hx of CHF with good communication with MD through notes as per EPIC.  Telephone encounters.   Action/Plan:   Lives at home with husband and was independent prior to admission.   Anticipated DC Date:  09/21/2011  Anticipated DC Plan:  HOME/SELF CARE  In-house referral:  Clinical Social Worker    DC Planning Services:  CM consult    PAC Choice:  HOME HEALTH   Choice offered to / List presented to:  C-1 Patient DME arranged:  OXYGEN     DME agency:  [facility]   [facility] arranged:  HH - 11 Patient Refused      Status of service:  Completed, signed off  Medicare Important Message Given:   (If response is "NO", the following Medicare IM given date fields will be blank) Date Medicare IM Given:   Date Additional Medicare IM Given:    Discharge Disposition:  HOME/SELF CARE  Per UR Regulation:  Reviewed for med. necessity/level of care/duration of stay  Comments:   Sabino Niemann, LCSW following for discharge planning.  Oxygen set up through [facility] as per above.  Heart Failure Education discussed, packet given.  Salt, daily weights, zones, and patient had a working scale were discussed.  Will continue to assist.  Patient did not agree with a [facility] referral. 09/21/11 1543 Shannan Harper, RN, BSN   UR Concurrent Review Completed. 09/20/11 0960 Shannan Harper, RN, BSN  UR Concurrent Review Completed. 09/18/11 1211 Shannan Harper, RN, BSN  UR Initial Review Completed. 09/14/11 1121 Shannan Harper, RN, BSN   Initial CM contact:     By:   Initial CSW contact:  09/21/2011 12:00 M  By:  Sabino Niemann    Is this an INP Readmission < 30 days:  N (If "YES" please see readmission information at the bottom of  note)  Patient living status prior to this admission:  FAMILY  Patient setting prior to this admission:  HOME  Comorbid conditions being treated that contributed to this admission:  HIGH-Recent D/C, Afib, HTN, CHF  CHF Readmission Risk:  high  Type of patient education provided  HF Patient Education Assessment / Teach Back  HF Zone Tool / Magnet  Limit salt intake  Weigh daily     Patient education provided by  [facility]    Was referral made to [facility]:  N  Is the patient's PCP the same as attending:  N PCP:    Readmission < 30 Days If pt has HH, did they contact the agency before going to the ED:   Name of [facility] agency:    Was the follow-up physician visit scheduled prior to discharge:    Did the patient follow-up with the physician prior to this readmission:    Was there HF Clinic visits prior to readmission:    Were there ED visits between admissions:    Readmit type:    If unscheduled and related indicate reason for readmit:

## 2011-09-21 NOTE — Progress Notes (Signed)
CSW contacted [facility] to provide O2 for patient so she can return home.

## 2011-09-21 NOTE — Progress Notes (Signed)
Pt's tele and IV has been d/c, patient and family verbalizes understanding of discharge instructions________________________________________________________________________________________________________D. Manson Passey RN

## 2011-09-21 NOTE — Progress Notes (Signed)
Subjective: She is feeling better.  She feels like she is ready for discharge.  Unable to discontinue O2 yesterday.    Objective: Vital signs in last 24 hours: Temp:  [97.9 F (36.6 C)-98.7 F (37.1 C)] 98.2 F (36.8 C) (01/03 0605) Pulse Rate:  [64-69] 67  (01/03 0605) Resp:  [18-20] 18  (01/03 0605) BP: (112-122)/(61-74) 113/71 mmHg (01/03 0605) SpO2:  [90 %-96 %] 95 % (01/03 0605) Weight:  [64.048 kg (141 lb 3.2 oz)] 141 lb 3.2 oz (64.048 kg) (01/03 0605) Weight change: 0.544 kg (1 lb 3.2 oz) Last BM Date: 09/18/11  CBG (last 3)  No results found for this basename: GLUCAP:3 in the last 72 hours  Intake/Output from previous day: 01/02 0701 - 01/03 0700 In: 633 [P.O.:630; I.V.:3] Out: 1950 [Urine:1950] Intake/Output this shift:    General appearance: alert and no distress Eyes: no scleral icterus Throat: oropharynx moist without erythema Resp: rales bilaterally, slight increase compared to yesterday Cardio: regular rate and rhythm, S1, S2 normal, no murmur, click, rub or gallop GI: soft, non-tender; bowel sounds normal; no masses,  no organomegaly Extremities: no clubbing, cyanosis; trace edema  Lab Results:  Basename 09/20/11 0513 09/19/11 0600  NA 129* 129*  K 4.3 4.4  CL 89* 88*  CO2 35* 35*  GLUCOSE 93 92  BUN 9 10  CREATININE 0.94 0.88  CALCIUM 9.2 9.2  MG -- --  PHOS -- --    No results found for this basename: CKTOTAL:3,CKMB:3,CKMBINDEX:3,TROPONINI:3 in the last 72 hours No results found for this basename: TSH,T4TOTAL,FREET3,T3FREE,THYROIDAB in the last 72 hours No results found for this basename: VITAMINB12:2,FOLATE:2,FERRITIN:2,TIBC:2,IRON:2,RETICCTPCT:2 in the last 72 hours  Studies/Results: No results found.   Medications: Scheduled:   . amLODipine  10 mg Oral Daily  . antiseptic oral rinse  15 mL Mouth Rinse BID  . cholecalciferol  1,000 Units Oral Daily  . dabigatran  75 mg Oral Q12H  . dextromethorphan  30 mg Oral TID  . divalproex  500  mg Oral QHS  . Flora-Q  1 capsule Oral Daily  . furosemide  40 mg Oral Daily  . metoprolol tartrate  25 mg Oral BID  . moxifloxacin  400 mg Oral q1800  . multivitamins ther. w/minerals  1 tablet Oral Daily  . olmesartan  40 mg Oral Daily  . pantoprazole  80 mg Oral Q1200  . potassium chloride  10 mEq Oral BID  . sodium chloride  3 mL Intravenous Q12H  . timolol  1 drop Both Eyes BID  . travoprost (benzalkonium)  1 drop Left Eye QHS  . DISCONTD: albuterol  2.5 mg Nebulization QID   Continuous:   Assessment/Plan: Principal Problem: 1. *Dyspnea- slight increase in weight and crackles in bilateral bases though overall improved.  Patient to follow-up with Dr. Delford Field regarding PFTs, ?ILD.  Ambulate and arrange home O2 if sats drop below 88% off O2.    Active Problems: 2. CHF secondary to Diastolic Dysfunction- Lasix dose per Cards (?increase dose). Advised daily weights, sodium restriction.   3. PNA (pneumonia)- continue Avelox po through 1/4. 4. Influenza A- resolved.  Completed Tamiflu. OK to discontinue respiratory isolation. 5. Hypertension- BP excellent. 6. Atrial fibrillation- per Cards. 7. Hyponatremia- recheck BMET today.  Will follow-up as outpatient. 8. Disposition- per Cards.  Appears to be nearing baseline. OK to discharge from my standpoint. I can see her back within 1 week.    LOS: 8 days   Kweku Stankey,W DOUGLAS 09/21/2011, 7:35 AM

## 2011-09-21 NOTE — Progress Notes (Signed)
Pt's BP 95/58, MD notified no orders given at this time will continue to monitor pt_____________D. Manson Passey RN

## 2011-09-21 NOTE — Progress Notes (Signed)
Patient ID: [name], female   DOB: 04-28-1925, 78 y.o.   MRN: [id number] SUBJECTIVE: Looks much better. Still desats with 02 off. Diuresed 1317 ml yesterday on Lasix 40mg  each am.  Filed Vitals:   09/20/11 1146 09/20/11 1407 09/20/11 2227 09/21/11 0605  BP:  112/61 118/64 113/71  Pulse:  69 64 67  Temp:  98.7 F (37.1 C) 97.9 F (36.6 C) 98.2 F (36.8 C)  TempSrc:  Oral Oral Oral  Resp:  20 18 18   Height:      Weight:    64.048 kg (141 lb 3.2 oz)  SpO2: 90% 91% 96% 95%    Intake/Output Summary (Last 24 hours) at 09/21/11 0835 Last data filed at 09/21/11 0606  Gross per 24 hour  Intake    533 ml  Output   1950 ml  Net  -1417 ml    LABS: Basic Metabolic Panel:  Basename 09/20/11 0513 09/19/11 0600  NA 129* 129*  K 4.3 4.4  CL 89* 88*  CO2 35* 35*  GLUCOSE 93 92  BUN 9 10  CREATININE 0.94 0.88  CALCIUM 9.2 9.2  MG -- --  PHOS -- --   Liver Function Tests: No results found for this basename: AST:2,ALT:2,ALKPHOS:2,BILITOT:2,PROT:2,ALBUMIN:2 in the last 72 hours No results found for this basename: LIPASE:2,AMYLASE:2 in the last 72 hours CBC: No results found for this basename: WBC:2,NEUTROABS:2,HGB:2,HCT:2,MCV:2,PLT:2 in the last 72 hours Cardiac Enzymes: No results found for this basename: CKTOTAL:3,CKMB:3,CKMBINDEX:3,TROPONINI:3 in the last 72 hours BNP: No components found with this basename: POCBNP:3 D-Dimer: No results found for this basename: DDIMER:2 in the last 72 hours Hemoglobin A1C: No results found for this basename: HGBA1C in the last 72 hours Fasting Lipid Panel: No results found for this basename: CHOL,HDL,LDLCALC,TRIG,CHOLHDL,LDLDIRECT in the last 72 hours Thyroid Function Tests: No results found for this basename: TSH,T4TOTAL,FREET3,T3FREE,THYROIDAB in the last 72 hours Anemia Panel: No results found for this basename: VITAMINB12,FOLATE,FERRITIN,TIBC,IRON,RETICCTPCT in the last 72 hours  RADIOLOGY: Dg Chest 2 View  08/24/2011  *RADIOLOGY  REPORT*  Clinical Data: Follow-up lung nodule  CHEST - 2 VIEW  Comparison: 08/23/2011  Findings: Nipple markers were utilized.  1 cm nodular density in the right lung base does not correspond to the nipple marker.  This may be a lung nodule however there is scarring in the bases in this may be overlapping lung markings.  COPD.  Bibasilar scarring.  This is most prominent the bases.  No definite heart failure or pneumonia.  IMPRESSION: COPD with scarring.  1 cm right lower lobe nodule does not correspond to the nipple shadow.  Follow-up chest CT is suggested to rule out a lung nodule.  Original Report Authenticated By: Camelia Phenes, M.D.   Dg Chest 2 View  08/23/2011  *RADIOLOGY REPORT*  Clinical Data: Dyspnea.Shortness of breath.  Nonsmoker.  CHEST - 2 VIEW  Comparison: 07/24/2011 and 02/06/2011.  Findings: Slight improved aeration left lung.  Pulmonary vascular congestion most notable centrally superimposed on chronic changes.  1.2 cm nodular density right lower lobe.  PA view with nipple marker in place recommended.  Sequential pacemaker enters from the left with leads unchanged in the region of the right atrium and right ventricle.  Thoracic compression deformities stable.  Calcified aorta.  IMPRESSION: Slight improved aeration left lung.  Pulmonary vascular congestion most notable centrally superimposed on chronic changes.  1.2 cm nodular density right lower lobe.  PA view with nipple marker in place recommended.  Original Report Authenticated By: Almedia Balls  Constance Goltz, M.D.   Dg Chest Port 1 View  09/14/2011  *RADIOLOGY REPORT*  Clinical Data: Shortness of breath and respiratory distress  PORTABLE CHEST - 1 VIEW  Comparison: 09/13/2011  Findings: There is a left chest Endrit Gittins pacer device with lead in the right atrial appendage and right ventricle.  The heart size appears normal.  No significant change in the bilateral interstitial and airspace opacities.  IMPRESSION:  1.  No change in aeration the lungs  compared with previous exam.  Original Report Authenticated By: Rosealee Albee, M.D.   Dg Chest Port 1 View  09/13/2011  *RADIOLOGY REPORT*  Clinical Data: Short of breath for 1 month.  Difficulty breathing today.  PORTABLE CHEST - 1 VIEW  Comparison: 08/24/2011.  Findings: Unchanged dual lead left subclavian cardiac pacemaker. Decreased lung volumes are present compared to prior.  There is bilateral perihilar and basilar predominant airspace disease compatible with pulmonary edema and CHF.  Cephalization of pulmonary blood flow is present with pulmonary vascular congestion. These changes are superimposed on chronic lung disease.  IMPRESSION: Moderate CHF.  Unchanged pacemaker apparatus.  Original Report Authenticated By: Andreas Newport, M.D.    PHYSICAL EXAM General: Well developed, well nourished, in no acute distress Head: Eyes PERRLA, No xanthomas.   Normal cephalic and atramatic  Lungs.Much clearer without rales. Heart: HRRR S1 S2, with soft S4 murmur.  Pulses are 2+ & equal.            No carotid bruit. No JVD.  No abdominal bruits. No femoral bruits. Abdomen: Bowel sounds are positive, abdomen soft and non-tender without masses or                  Hernia's noted. Msk:  Back normal, normal gait. Normal strength and tone for age. Extremities: No clubbing, cyanosis or edema.  DP +1 Neuro: Alert and oriented X 3. Psych:  Good affect, responds appropriately  TELEMETRY: Reviewed telemetry pt in Atrial pacing  ASSESSMENT AND PLAN:  She is much better. Can be discharged today with possible 02. Will need followup with Dr Delford Field, Dr Clelia Croft and the Dr [place]. CHF protocol reviewed with pt and husband. Finish 10 days of antibiotics.  Valera Castle, MD 09/21/2011 8:35 AM

## 2011-09-21 NOTE — Plan of Care (Signed)
Problem: Food- and Nutrition-Related Knowledge Deficit (NB-1.1) Goal: Nutrition education Formal process to instruct or train a patient/client in a skill or to impart knowledge to help patients/clients voluntarily manage or modify food choices and eating behavior to maintain or improve health.  Outcome: Completed/Met Date Met:  09/21/11 RD was consulted for CHF diet education. Patient with a HX of CHF and prior education per patient. Patient reports trying to use less salt at home but still uses a salt shaker on the table and uses high salt containing products. RD educated patient on reasons to reduce sodium as well as appropriate food choices. Patient's husband has the CHF booklet at home and has read the information on the diet. No additional materials provided. All patient questions were answered, and patient was encouraged to notify RN if additional education requested.   No other nutrition risk identified at this time. No additional interventions to be provided at this time.  Goals met.   [name] MARIE

## 2011-09-21 NOTE — Progress Notes (Signed)
Walked with patient in hallway, before walking, patient's O2 was 95% on RA. While walking in the hallway patient's O2 sats decreased to 86% on RA, 3L of O2 was applied and pt's O2 sats increased to 93-94%._____________D. Manson Passey

## 2011-09-21 NOTE — Progress Notes (Signed)
Physical Therapy Treatment Patient Details Name: [name] MRN: [id number] DOB: 04-12-1925 Today's Date: 09/21/2011  PT Assessment/Plan  PT - Assessment/Plan Comments on Treatment Session: Pt plans to d/c home today and waiting for home O2 tank.  Pt agreeable to ambulate but refuse steps.  Pt needed 3L O2 during ambulation with SaO2 maitaining 94%.  Pt plans to d/c home without HHPT after recommendation for HHPT. PT Plan: Discharge plan remains appropriate;Frequency remains appropriate PT Frequency: Min 3X/week Follow Up Recommendations: Home health PT Equipment Recommended: None recommended by PT PT Goals  Acute Rehab PT Goals PT Goal Formulation: With patient Time For Goal Achievement: 2 weeks Pt will go Supine/Side to Sit: Independently PT Goal: Supine/Side to Sit - Progress: Progressing toward goal Pt will go Sit to Stand: with modified independence PT Goal: Sit to Stand - Progress: Progressing toward goal Pt will Ambulate: >150 feet;with modified independence;with least restrictive assistive device PT Goal: Ambulate - Progress: Progressing toward goal  PT Treatment Precautions/Restrictions  Precautions Precautions:  (Droplet) Restrictions Weight Bearing Restrictions: No Mobility (including Balance) Bed Mobility Bed Mobility: Yes Supine to Sit: 6: Modified independent (Device/Increase time) Transfers Transfers: Yes Sit to Stand: 5: Supervision;With upper extremity assist;From chair/3-in-1 Sit to Stand Details (indicate cue type and reason): Supervision for safety with cues for [facility] during transfer. Stand to Sit: 5: Supervision;With upper extremity assist;With armrests;To chair/3-in-1 Stand to Sit Details: cues for hand placement] Ambulation/Gait Ambulation/Gait: Yes Ambulation/Gait Assistance: 5: Supervision Ambulation/Gait Assistance Details (indicate cue type and reason): Supervision for safety with assistance with O2 tank Ambulation Distance (Feet): 150  Feet Assistive device: Straight cane Gait Pattern: Decreased stride length;Shuffle Stairs: No Wheelchair Mobility Wheelchair Mobility: No    Exercise    End of Session PT - End of Session Equipment Utilized During Treatment: Gait belt Activity Tolerance: Patient tolerated treatment well Patient left: in chair;with call bell in reach;with family/visitor present Nurse Communication: Mobility status for transfers General Behavior During Session: [facility] for tasks performed Cognition: [facility] for tasks performed  Charde Macfarlane 09/21/2011, 3:15 PM [phone]

## 2011-09-21 NOTE — Discharge Summary (Signed)
Discharge Summary   Patient ID: [name],  MRN: [id number], DOB/AGE: [birth year] 78 y.o.  Admit date: 09/13/2011 Discharge date: 09/21/2011  Discharge Diagnoses Principal Problem:  *Dyspnea Active Problems:  Hypertension  GERD (gastroesophageal reflux disease)  [place]ath-induced atrial fibrillation  Diastolic CHF, chronic  PNA (pneumonia)  Influenza A  Hyponatremia  Hypokalemia   Allergies No Known Allergies  Procedures  None  History of Present Illness  [name] is a 78yo female with PMHx significant for diastolic CHF (on Lopressor, Lisinopril, Lasix outpatient), s/p PPM implantation (10/12 for tachy-brady syndrome), atrial fibrillation (recently stopped Amiodarone last week, Pradaxa) and GERD.   Recently seen in the office on 12/03. Had recently been admitted post-PPM implantation (see above indication) for multifactorial acute respiratory failure secondary to diastolic CHF and aspiration 2/2 to dysphagia. Breathing was much improved at this office visit. Pulse was regular. Pt complained of some LE edema. Lungs were clear on exam, continued on Lasix 40 mg daily.   Pt reported a one week history of worsening SOB, increased weight gain of approximately 10 lbs. She reported an uninhibited consumption of salty foods recently prior to presentation. She endorsed 1 pillow orthopnea and PND at baseline. She reported abdominal fullness. She denied chest pain, palpitations, diaphoresis, n/v, fevers, chills, productive cough, bloody stool or diarrhea.  She called the [employer] office today with her complaints and was advised to present to [facility] ED. EKG revealed NSR without ST-T wave changes, possible old anteroseptal infarction. CXR revealed moderate pulmonary edema consistent with CHF superimposed on chronic lung changes. BNP elevated at >4000. BUN/Cr WNL. CE neg x 1. Amiodarone had recently been discontinued as was question whether this could be the cause of her  dyspnea.  Hospital Course   She was subsequently admitted for acute on chronic diastolic CHF and IV diuresis was initiated. Amiodarone was held as there is a question of apparent interstitial lung disease on PFTs in the past. She diuresed well however on day one of admission, she developed worsening dyspnea with oxygen saturation decreasing 85% on room air. This increased to 92% on 4 L O2. Exam revealed bilateral wheezes coarse rhonchi and rales in the left base. Superimposed pneumonia was suspected and she was started on broad-spectrum antibiotic coverage. Chest x-ray was unchanged from that ordered in the ED. She was transferred to step down.   Internal medicine was consulted and a flu swab was obtained she was started on Tamiflu. She continued to diuresis well with slight elevations in BUN and creatinine. Lasix was switched to PO and dose was adjusted appropriately. She developed hyponatremia likely secondary to diuresis. BP medications were adjusted throughout her course. She maintained normal sinus rhythm and was found to be in atrial pacing intermittently.   Her dyspnea improved and today she is I/O - 8.7L. She increased activity level gradually without incident. She was maintained on 2L O2 via n/c throughout her admission and noted to have desaturations off of O2. She will be discharged with HH oxygen. Today she is stable, at baseline and will be discharged home. BP was a bit soft, morning antihypertensives were held. Repeat BP was WNL. She was advised to continue PM dose of BP meds. She will finish course of Avelox. ACEI discontinued as she was already on an ARB. Lasix was adjusted. Amiodarone will be held, Pradaxa continued. She will be discharged with albuterol. She will follow-up with Dr. Clelia Croft, Dr. Swaziland and Dr. Priscille Kluver to undergo formal PFTs and follow-up.  Discharge Vitals:  Blood pressure 95/58, pulse 65, temperature 98.1 F (36.7 C), temperature source Oral, resp. rate 18,  height 5\' 2"  (1.575 m), weight 64.048 kg (141 lb 3.2 oz), SpO2 92.00%.   I/O: - 8.7L  Weight change: 0.544 kg (1 lb 3.2 oz)  Labs:  Lab 09/21/11 0930 09/20/11 0513 09/19/11 0600  NA 128* 129* 129*  K 4.9 4.3 4.4  CL 90* 89* 88*  CO2 31 35* 35*  BUN 12 9 10   CREATININE 1.05 0.94 0.88  CALCIUM 9.5 9.2 9.2  PROT -- -- --  BILITOT -- -- --  ALKPHOS -- -- --  ALT -- -- --  AST -- -- --  AMYLASE -- -- --  LIPASE -- -- --  GLUCOSE 118* 93 92   Disposition: stable, at baseline Discharge Orders    Future Appointments: Provider: Department: Dept Phone: Center:   10/03/2011 10:15 AM Rubye Oaks, NP [facility] [phone] None   10/18/2011 4:00 PM Peter Swaziland, MD [facility] [phone] None   11/23/2011 11:00 AM Lorenda Cahill [facility] [phone] [facility]   11/30/2011 11:30 AM Peter [place], MD [facility] [phone] None   07/09/2012 9:00 AM [facility] [phone] [facility]   07/09/2012 10:00 AM [name], MD [facility] [phone] [facility]     Future Orders Please Complete By Expires   Ambulatory referral to Home Health      Comments:   Please evaluate [name] for admission to [facility].  Disciplines requested: Home Health Aide  Services to provide: Oxygen Via Nasal Cannula at 2 liters/minute  Physician to follow patient's care (the person listed here will be responsible for signing ongoing orders): PCP  Requested Start of Care Date: Tomorrow  Special Instructions:  Please arrange for home health oxygen.     Follow-up Information    Follow up with Peter Swaziland, MD on 10/18/2011. (At 4:00 PM. )    Contact information:   [address], [address] [place] [address] [phone]       Follow up with PARRETT,TAMMY, NP on 10/03/2011. (10:15 AM)    Contact information:   [facility] [address] [place] [address] [phone]       Please follow up. (Dr.  Alver Fisher office will call you with an appointment. )          Discharge Medications:  Current Discharge Medication List    START taking these medications   Details  albuterol (PROVENTIL) (2.5 MG/3ML) 0.083% nebulizer solution Take 3 mLs (2.5 mg total) by nebulization every 6 (six) hours as needed for wheezing. Qty: 75 mL, Refills: 12    amLODipine (NORVASC) 10 MG tablet Take 1 tablet (10 mg total) by mouth daily. Qty: 30 tablet, Refills: 3    dextromethorphan-guaiFENesin (MUCINEX DM) 30-600 MG per 12 hr tablet Take 1 tablet by mouth 2 (two) times daily as needed. Qty: 14 tablet, Refills: 0    moxifloxacin (AVELOX) 400 MG tablet Take 1 tablet (400 mg total) by mouth daily at 6 PM. Qty: 7 tablet, Refills: 0    olmesartan (BENICAR) 40 MG tablet Take 1 tablet (40 mg total) by mouth daily. Qty: 30 tablet, Refills: 3    potassium chloride (K-DUR,KLOR-CON) 10 MEQ tablet Take 1 tablet (10 mEq total) by mouth 2 (two) times daily. Qty: 60 tablet, Refills: 3      CONTINUE these medications which have CHANGED   Details  furosemide (LASIX) 40 MG  tablet Take 1 tablet (40 mg total) by mouth daily. Qty: 30 tablet, Refills: 3      CONTINUE these medications which have NOT CHANGED   Details  Calcium Citrate (CITRACAL PO) Take 2 tablets by mouth daily.     cholecalciferol (VITAMIN D) 1000 UNITS tablet Take 1,000 Units by mouth daily.      dabigatran (PRADAXA) 75 MG CAPS Take 75 mg by mouth every 12 (twelve) hours.      divalproex (DEPAKOTE) 500 MG EC tablet Take 500 mg by mouth at bedtime.      HYDROcodone-acetaminophen (VICODIN) 5-500 MG per tablet Take 1 tablet by mouth every 6 (six) hours as needed. For pain     methocarbamol (ROBAXIN) 500 MG tablet Take 500 mg by mouth 2 (two) times daily as needed. For pain    metoprolol tartrate (LOPRESSOR) 25 MG tablet Take 1 tablet (25 mg total) by mouth 2 (two) times daily. Qty: 180 tablet, Refills: 3    Multiple Vitamins-Minerals  (MULTIVITAMINS THER. W/MINERALS) TABS Take 1 tablet by mouth daily.      omeprazole (PRILOSEC) 40 MG capsule Take 40 mg by mouth daily.      polyethylene glycol powder (GLYCOLAX/MIRALAX) powder Take 8 g by mouth daily as needed. For constipation    timolol (BETIMOL) 0.5 % ophthalmic solution Place 1 drop into both eyes 2 (two) times daily.     travoprost, benzalkonium, (TRAVATAN) 0.004 % ophthalmic solution Place 1 drop into the left eye at bedtime.       STOP taking these medications     amLODipine-olmesartan (AZOR) 10-40 MG per tablet Comments:  Reason for Stopping:       lisinopril (PRINIVIL,ZESTRIL) 40 MG tablet Comments:  Reason for Stopping:          Outstanding Labs/Studies: None; recommended PFTs as outpatient  Duration of Discharge Encounter: 45 minutes including physician time.  Signed, R. Hurman Horn, PA-C 09/21/2011, 12:01 PM    Agree with assessment and plan and discharged instructions. I reviewed verbally with the patient and her husband.

## 2011-09-21 NOTE — Progress Notes (Signed)
CSW met with patient and patient's husband at bedside. CSW reviewed all CHF management guidelines and reinforced the importance of eating a low sodium diet. Pt and pt's husband had questions about dietary restrictions and the CSW provided the patient with a packet about appropriate dietary choices. Pt and patient's husband were not interested in home health or a [facility] referral.  Clinical Social worker completed a comprehensive psychosocial assessment which can be found in the shadow chart. Clinical Social Worker will sign off for now as social work intervention is no longer needed. Please consult [place] again if new need arises.

## 2011-09-25 ENCOUNTER — Telehealth: Payer: Self-pay | Admitting: Cardiology

## 2011-09-25 NOTE — Telephone Encounter (Signed)
New Problem:    Patient's husband called needing some samples or an emergency prescription of his wife's metoprolol tartrate (LOPRESSOR) 25 MG tablet.  She has run out and all they need is six more tablets to make it to her next refill. Please call back.

## 2011-09-25 NOTE — Telephone Encounter (Signed)
Patient's husband called, stating patient out of her metoprolol tart ,waiting on mail order.Wants 8 tablets,that will be enough,mail order will be in this week.Metoprolol tart 25 mg twice daily # 8 tablets called to [facility] [address]

## 2011-09-27 ENCOUNTER — Ambulatory Visit (INDEPENDENT_AMBULATORY_CARE_PROVIDER_SITE_OTHER)
Admission: RE | Admit: 2011-09-27 | Discharge: 2011-09-27 | Disposition: A | Discharge status: Home / Self Care | Payer: Medicare Other | Source: Ambulatory Visit | Attending: Critical Care Medicine | Admitting: Critical Care Medicine

## 2011-09-27 ENCOUNTER — Encounter: Payer: Self-pay | Admitting: Critical Care Medicine

## 2011-09-27 ENCOUNTER — Ambulatory Visit (INDEPENDENT_AMBULATORY_CARE_PROVIDER_SITE_OTHER): Payer: Medicare Other | Admitting: Critical Care Medicine

## 2011-09-27 DIAGNOSIS — R0600 Dyspnea, unspecified: Secondary | ICD-10-CM

## 2011-09-27 DIAGNOSIS — J90 Pleural effusion, not elsewhere classified: Secondary | ICD-10-CM | POA: Diagnosis not present

## 2011-09-27 DIAGNOSIS — R918 Other nonspecific abnormal finding of lung field: Secondary | ICD-10-CM | POA: Diagnosis not present

## 2011-09-27 DIAGNOSIS — J811 Chronic pulmonary edema: Secondary | ICD-10-CM

## 2011-09-27 DIAGNOSIS — I517 Cardiomegaly: Secondary | ICD-10-CM | POA: Diagnosis not present

## 2011-09-27 DIAGNOSIS — R0989 Other specified symptoms and signs involving the circulatory and respiratory systems: Secondary | ICD-10-CM

## 2011-09-27 DIAGNOSIS — J45909 Unspecified asthma, uncomplicated: Secondary | ICD-10-CM

## 2011-09-27 DIAGNOSIS — R0609 Other forms of dyspnea: Secondary | ICD-10-CM | POA: Diagnosis not present

## 2011-09-27 LAB — PULMONARY FUNCTION TEST

## 2011-09-27 MED ORDER — COMPRESSOR/NEBULIZER MISC: Status: DC

## 2011-09-27 NOTE — Patient Instructions (Signed)
Stay on oxygen You are clear for amiodarone use, I will call Dr Swaziland USe albuterol in nebulizer as needed, I will call [facility] for new nebulizer Chest xray today Return to pulmonary as needed

## 2011-09-27 NOTE — Progress Notes (Signed)
Subjective:    Patient ID: [name], female    DOB: [birth year], 78 y.o.   MRN: [id number]  HPI  78 y.o.F Seen in post hosp f/u for PNA.   Pt in hosp 10/26- 11/5  For atrial fib, acute on chronic Diastolic CHF Pulmonary edema, resp failure with hypoxemia.   Pt notes still with dyspnea, not very active.  Still having heartburn.  No real cough.   No chest pain. Now has PPM.  On pradaxa. Still on pacerone.  No real dysphagia. Notes some edema.  Non smoker. No pfts yet.  Sleeps on reg pillow.  No qhs dyspnea. No real wheeze.  PNA one year ago in hosp.    1/9 Still coughing some mucus, dyspnea is better. Pt in hosp 12/26 - 1/3 Now on oxygen 2Liter [facility] On nebulizer med but no neb med Past Medical History  Diagnosis Date  . Pneumonia 04/2010  . Hypertension   . Renal artery stenosis 2008    STATUS POST ANGIOPLASTY  . GERD (gastroesophageal reflux disease)   . Back pain     CHRONIC  . Atrial fibrillation     Prior TEE/CV in May 2012  . Hyponatremia     improved  . Seizure disorder     on anti-epileptic therapy  . Urinary incontinence   . Osteopenia   . SIADH (syndrome of inappropriate ADH production)     h/o HYPONATREMIA ; LIKELY RELATED TO HER LUNG PROCESS  . Arthritis   . Hiatal hernia   . Hiatal hernia   . Tachy-brady syndrome     with pauses noted in May 2012  . Dysphagia      Family History  Problem Relation Age of Onset  . Stroke Mother   . Diabetes Mother   . Heart disease Mother     Unkown  . Stroke Father   . Diabetes Father   . Cancer Father     PROSTATE  . Cancer Brother     RENAL AND BLADDER  . Lung disease Brother   . Lung disease Sister   . Hypertension Daughter   . Hypertension Son      History   Social History  . Marital Status: Married    Spouse Name: N/A    Number of Children: 3  . Years of Education: 10   Occupational History  . [employer]    1980   Social History Main Topics  . Smoking status: Never Smoker     . Smokeless tobacco: Never Used  . Alcohol Use: No  . Drug Use: No  . Sexually Active: No   Other Topics Concern  . Not on file   Social History Narrative   REMARRIED3 CHILDREN2 GRANDCHILDREN 4 GREAT GRANDCHILDREN10th GRADE EDUCATIONRETIRED FROM SEARS MAIL ORDER SINCE 1980\NO SMOKING, OR DRUG USEACTIVITY: AMBULATES WITH CANE OUTSIDE HOME DUE TO KNEE PAIN. ABLE TO AMBULATE WITHIN HOME, UP STAIRS WITHOUT INCIDENT AT BASELINE.      No Known Allergies   Outpatient Prescriptions Prior to Visit  Medication Sig Dispense Refill  . amLODipine (NORVASC) 10 MG tablet Take 1 tablet (10 mg total) by mouth daily.  30 tablet  3  . Calcium Citrate (CITRACAL PO) Take 2 tablets by mouth daily.       . cholecalciferol (VITAMIN D) 1000 UNITS tablet Take 1,000 Units by mouth daily.        . dabigatran (PRADAXA) 75 MG CAPS Take 75 mg by mouth every 12 (twelve) hours.        Marland Kitchen  dextromethorphan-guaiFENesin (MUCINEX DM) 30-600 MG per 12 hr tablet Take 1 tablet by mouth 2 (two) times daily as needed.  14 tablet  0  . divalproex (DEPAKOTE) 500 MG EC tablet Take 500 mg by mouth at bedtime.        . furosemide (LASIX) 40 MG tablet Take 1 tablet (40 mg total) by mouth daily.  30 tablet  3  . HYDROcodone-acetaminophen (VICODIN) 5-500 MG per tablet Take 1 tablet by mouth every 6 (six) hours as needed. For pain       . methocarbamol (ROBAXIN) 500 MG tablet Take 500 mg by mouth 2 (two) times daily as needed. For pain      . metoprolol tartrate (LOPRESSOR) 25 MG tablet Take 1 tablet (25 mg total) by mouth 2 (two) times daily.  180 tablet  3  . moxifloxacin (AVELOX) 400 MG tablet Take 1 tablet (400 mg total) by mouth daily at 6 PM.  7 tablet  0  . Multiple Vitamins-Minerals (MULTIVITAMINS THER. W/MINERALS) TABS Take 1 tablet by mouth daily.        Marland Kitchen olmesartan (BENICAR) 40 MG tablet Take 1 tablet (40 mg total) by mouth daily.  30 tablet  3  . omeprazole (PRILOSEC) 40 MG capsule Take 40 mg by mouth daily.        .  polyethylene glycol powder (GLYCOLAX/MIRALAX) powder Take 8 g by mouth daily as needed. For constipation      . potassium chloride (K-DUR,KLOR-CON) 10 MEQ tablet Take 1 tablet (10 mEq total) by mouth 2 (two) times daily.  60 tablet  3  . timolol (BETIMOL) 0.5 % ophthalmic solution Place 1 drop into both eyes 2 (two) times daily.       . travoprost, benzalkonium, (TRAVATAN) 0.004 % ophthalmic solution Place 1 drop into the left eye at bedtime.       Marland Kitchen albuterol (PROVENTIL) (2.5 MG/3ML) 0.083% nebulizer solution Take 3 mLs (2.5 mg total) by nebulization every 6 (six) hours as needed for wheezing.  75 mL  12     Review of Systems  Constitutional:   No  weight loss, night sweats,  Fevers, chills, fatigue, lassitude. HEENT:   No headaches,  Difficulty swallowing,  Tooth/dental problems,  Sore throat,                No sneezing, itching, ear ache, nasal congestion, post nasal drip,   CV:  No chest pain,  Orthopnea, PND, swelling in lower extremities, anasarca, dizziness, palpitations  GI  Notes  Heartburn,notes  Indigestion,  No  abdominal pain, nausea, vomiting, diarrhea, change in bowel habits, loss of appetite  Resp: Notes  shortness of breath with exertion not at rest.  No excess mucus, no productive cough,  No non-productive cough,  No coughing up of blood.  No change in color of mucus.  No wheezing.  No chest wall deformity  Skin: no rash or lesions.  GU: no dysuria, change in color of urine, no urgency or frequency.  No flank pain.  MS:  No joint pain or swelling.  No decreased range of motion.  No back pain.  Psych:  No change in mood or affect. No depression or anxiety.  No memory loss.     Objective:   Physical Exam   Filed Vitals:   09/27/11 1154  BP: 106/60  Pulse: 80  Temp: 97.8 F (36.6 C)  TempSrc: Oral  Height: 5\' 2"  (1.575 m)  Weight: 147 lb (66.679 kg)  SpO2: 99%  Gen: Pleasant, well-nourished, in no distress,  normal affect  ENT: No lesions,  mouth clear,   oropharynx clear, no postnasal drip  Neck: No JVD, no TMG, no carotid bruits  Lungs: No use of accessory muscles, no dullness to percussion,distant BS  Cardiovascular: RRR, heart sounds normal, no murmur or gallops, no peripheral edema  Abdomen: soft and NT, no HSM,  BS normal  Musculoskeletal: No deformities, no cyanosis or clubbing  Neuro: alert, non focal  Skin: Warm, no lesions or rashes   Dg Chest 2 View  09/27/2011  *RADIOLOGY REPORT*  Clinical Data: Shortness of breath and cough.  Pulmonary edema.  CHEST - 2 VIEW  Comparison: Plain films of the chest 08/24/2011, 02/06/2011 and 09/14/2011.  CT chest 07/19/2011.  Findings: Heart size is mildly enlarged.  The patient has very small bilateral pleural effusions.  There is basilar scarring without marked change.  No pneumothorax identified.  Coarsening of the pulmonary interstitium again noted.  IMPRESSION:  1.  Very small bilateral pleural effusions. 2.  Chronic interstitial change and mild cardiomegaly.  Original Report Authenticated By: Bernadene Bell. D'ALESSIO, M.D.   \     Assessment & Plan:   Dyspnea Dyspnea likely due to diastolic CHF with pulm edema, now improved . I doubt any primary lung disease. Note most issue has been pulmonary edema and recent PNA with PAF RVR The current PFTs show DLCO 68%  I do not feel this precludes amiodarone use in this patient. Plan Cont oxygen therapy Ok to use albuterol prn The pt may use amiodarone     Updated Medication List Outpatient Encounter Prescriptions as of 09/27/2011  Medication Sig Dispense Refill  . amLODipine (NORVASC) 10 MG tablet Take 1 tablet (10 mg total) by mouth daily.  30 tablet  3  . Calcium Citrate (CITRACAL PO) Take 2 tablets by mouth daily.       . cholecalciferol (VITAMIN D) 1000 UNITS tablet Take 1,000 Units by mouth daily.        . dabigatran (PRADAXA) 75 MG CAPS Take 75 mg by mouth every 12 (twelve) hours.        Marland Kitchen dextromethorphan-guaiFENesin (MUCINEX DM)  30-600 MG per 12 hr tablet Take 1 tablet by mouth 2 (two) times daily as needed.  14 tablet  0  . divalproex (DEPAKOTE) 500 MG EC tablet Take 500 mg by mouth at bedtime.        . furosemide (LASIX) 40 MG tablet Take 1 tablet (40 mg total) by mouth daily.  30 tablet  3  . HYDROcodone-acetaminophen (VICODIN) 5-500 MG per tablet Take 1 tablet by mouth every 6 (six) hours as needed. For pain       . methocarbamol (ROBAXIN) 500 MG tablet Take 500 mg by mouth 2 (two) times daily as needed. For pain      . metoprolol tartrate (LOPRESSOR) 25 MG tablet Take 1 tablet (25 mg total) by mouth 2 (two) times daily.  180 tablet  3  . moxifloxacin (AVELOX) 400 MG tablet Take 1 tablet (400 mg total) by mouth daily at 6 PM.  7 tablet  0  . Multiple Vitamins-Minerals (MULTIVITAMINS THER. W/MINERALS) TABS Take 1 tablet by mouth daily.        Marland Kitchen olmesartan (BENICAR) 40 MG tablet Take 1 tablet (40 mg total) by mouth daily.  30 tablet  3  . omeprazole (PRILOSEC) 40 MG capsule Take 40 mg by mouth daily.        . polyethylene glycol powder (  GLYCOLAX/MIRALAX) powder Take 8 g by mouth daily as needed. For constipation      . potassium chloride (K-DUR,KLOR-CON) 10 MEQ tablet Take 1 tablet (10 mEq total) by mouth 2 (two) times daily.  60 tablet  3  . timolol (BETIMOL) 0.5 % ophthalmic solution Place 1 drop into both eyes 2 (two) times daily.       . travoprost, benzalkonium, (TRAVATAN) 0.004 % ophthalmic solution Place 1 drop into the left eye at bedtime.       Marland Kitchen albuterol (PROVENTIL) (2.5 MG/3ML) 0.083% nebulizer solution Take 3 mLs (2.5 mg total) by nebulization every 6 (six) hours as needed for wheezing.  75 mL  12  . Nebulizers (COMPRESSOR/NEBULIZER) MISC Use as directed with albuterol  1 each  0

## 2011-09-27 NOTE — Progress Notes (Signed)
PFT done today. 

## 2011-09-28 NOTE — Assessment & Plan Note (Signed)
Dyspnea likely due to diastolic CHF with pulm edema, now improved . I doubt any primary lung disease. Note most issue has been pulmonary edema and recent PNA with PAF RVR The current PFTs show DLCO 68%  I do not feel this precludes amiodarone use in this patient. Plan Cont oxygen therapy Ok to use albuterol prn The pt may use amiodarone

## 2011-09-28 NOTE — Progress Notes (Signed)
Quick Note:  Notify the patient that the Xray is stable and no pneumonia, I called the cardiology MDs to tell them ok to start amiodarone.  ______

## 2011-09-29 ENCOUNTER — Encounter: Payer: Self-pay | Admitting: Cardiology

## 2011-09-29 ENCOUNTER — Telehealth: Payer: Self-pay | Admitting: Cardiology

## 2011-09-29 DIAGNOSIS — R0602 Shortness of breath: Secondary | ICD-10-CM | POA: Diagnosis not present

## 2011-09-29 DIAGNOSIS — M899 Disorder of bone, unspecified: Secondary | ICD-10-CM | POA: Diagnosis not present

## 2011-09-29 DIAGNOSIS — I10 Essential (primary) hypertension: Secondary | ICD-10-CM | POA: Diagnosis not present

## 2011-09-29 DIAGNOSIS — I519 Heart disease, unspecified: Secondary | ICD-10-CM | POA: Diagnosis not present

## 2011-09-29 DIAGNOSIS — M949 Disorder of cartilage, unspecified: Secondary | ICD-10-CM | POA: Diagnosis not present

## 2011-09-29 NOTE — Telephone Encounter (Signed)
Consulted Jacqueline Fredrickson np, stay on pradaxa 75mg  bid, pt wants written script mailed to him with #[id number] [id number], Informed Dr Swaziland not here, script request will be sent to his nurse to get signed and to mail. Pt verbalized understanding.

## 2011-09-29 NOTE — Telephone Encounter (Signed)
New msg Pt's husband called. He said she has been taking pradaxa 75 mg two times per day. At discharge from hospital he got rx for 150 mg of pradaxa two times per day.  please call with dosage she is supposed to take.

## 2011-10-02 ENCOUNTER — Other Ambulatory Visit: Payer: Self-pay

## 2011-10-02 MED ORDER — DABIGATRAN ETEXILATE MESYLATE 75 MG PO CAPS: 75.0000 mg | ORAL_CAPSULE | Freq: Two times a day (BID) | ORAL | Status: DC

## 2011-10-02 NOTE — Telephone Encounter (Signed)
Pradaxa 75 mg bid # 180, 3 refills written prescription mailed to patient.

## 2011-10-03 ENCOUNTER — Inpatient Hospital Stay: Payer: Medicare Other | Admitting: Adult Health

## 2011-10-06 ENCOUNTER — Other Ambulatory Visit (HOSPITAL_COMMUNITY): Payer: Self-pay | Admitting: *Deleted

## 2011-10-11 ENCOUNTER — Encounter (HOSPITAL_COMMUNITY)
Admission: RE | Admit: 2011-10-11 | Discharge: 2011-10-11 | Disposition: A | Discharge status: Home / Self Care | Payer: Medicare Other | Source: Ambulatory Visit | Attending: Internal Medicine | Admitting: Internal Medicine

## 2011-10-11 ENCOUNTER — Encounter: Payer: Self-pay | Admitting: Critical Care Medicine

## 2011-10-11 DIAGNOSIS — M810 Age-related osteoporosis without current pathological fracture: Secondary | ICD-10-CM | POA: Diagnosis not present

## 2011-10-11 MED ORDER — SODIUM CHLORIDE 0.9 % IV SOLN
INTRAVENOUS | Status: AC
  Administered 2011-10-11: 12:00:00 via INTRAVENOUS

## 2011-10-11 MED ORDER — ZOLEDRONIC ACID 5 MG/100ML IV SOLN
5.0000 mg | Freq: Once | INTRAVENOUS | Status: AC
  Administered 2011-10-11: 5 mg via INTRAVENOUS
  Filled 2011-10-11: qty 100

## 2011-10-11 NOTE — Progress Notes (Signed)
Pt arrived to unit c skin tear lle  She and husband state from "husband accidentally closed car door on her leg)  Gently washed c saline and covered c opsite   Strongly inforced to call md and to especially contact her md if any symptoms of swelling ,pain, reddness or calf tenderness  States understands  Note sent to dr Clelia Croft

## 2011-10-18 ENCOUNTER — Encounter: Payer: Self-pay | Admitting: Nurse Practitioner

## 2011-10-18 ENCOUNTER — Ambulatory Visit (INDEPENDENT_AMBULATORY_CARE_PROVIDER_SITE_OTHER): Payer: Medicare Other | Admitting: Nurse Practitioner

## 2011-10-18 ENCOUNTER — Encounter: Payer: Medicare Other | Admitting: Cardiology

## 2011-10-18 VITALS — BP 126/68 | HR 60 | Ht 62.0 in | Wt 149.0 lb

## 2011-10-18 DIAGNOSIS — Z7901 Long term (current) use of anticoagulants: Secondary | ICD-10-CM | POA: Diagnosis not present

## 2011-10-18 DIAGNOSIS — I5032 Chronic diastolic (congestive) heart failure: Secondary | ICD-10-CM

## 2011-10-18 DIAGNOSIS — I4891 Unspecified atrial fibrillation: Secondary | ICD-10-CM

## 2011-10-18 DIAGNOSIS — I10 Essential (primary) hypertension: Secondary | ICD-10-CM

## 2011-10-18 DIAGNOSIS — I509 Heart failure, unspecified: Secondary | ICD-10-CM

## 2011-10-18 NOTE — Assessment & Plan Note (Signed)
Blood pressure looks ok. No change with her medicines.

## 2011-10-18 NOTE — Assessment & Plan Note (Signed)
She remains on Pradaxa. She remains off of amiodarone. Dr. Swaziland wishes to leave her off the amiodarone. We do not see where she can identify any benefit when she is in sinus versus atrial fib. She is committed to long term anticoagulation.

## 2011-10-18 NOTE — Progress Notes (Signed)
[name] Date of Birth: 06/25/1925 Medical Record #[id number]  History of Present Illness: [name] is seen today for a post hospital visit. She is seen with Dr. [place]. She is slowly getting stronger. She was admitted right after Christmas with pneumonia and diastolic heart failure. She was there for about a week. Had had some increased salt use prior to that admission according to her husband. She was treated with antibiotics and diuretics. Her amiodarone had been stopped due to concerns for interstitial lung disease. She saw Dr. Delford Field earlier this month. He felt like she did not have a primary lung disorder and could take the amiodarone if needed.  She does have PAF. She is on Pradaxa. She has no awareness of her arrhythmia.   Since getting home, she is slowly getting stronger. Still using some oxygen as needed. Coughing less. No sputum. She is weighing daily and her weights have been stable for the most part. She has had to take some extra Lasix on occasion. No chest pain. No swelling. She is using her cane. No falls. Her husband manages her medicines and she seems to be tolerating them ok.   Current Outpatient Prescriptions on File Prior to Visit  Medication Sig Dispense Refill  . amLODipine (NORVASC) 10 MG tablet Take 1 tablet (10 mg total) by mouth daily.  30 tablet  3  . Calcium Citrate (CITRACAL PO) Take 2 tablets by mouth daily.       . cholecalciferol (VITAMIN D) 1000 UNITS tablet Take 1,000 Units by mouth daily.        . dabigatran (PRADAXA) 75 MG CAPS Take 75 mg by mouth every 12 (twelve) hours.        . divalproex (DEPAKOTE) 500 MG EC tablet Take 500 mg by mouth at bedtime.        . furosemide (LASIX) 40 MG tablet Take 1 tablet (40 mg total) by mouth daily.  30 tablet  3  . HYDROcodone-acetaminophen (VICODIN) 5-500 MG per tablet Take 1 tablet by mouth every 6 (six) hours as needed. For pain       . methocarbamol (ROBAXIN) 500 MG tablet Take 500 mg by mouth 2 (two) times daily  as needed. For pain      . metoprolol tartrate (LOPRESSOR) 25 MG tablet Take 1 tablet (25 mg total) by mouth 2 (two) times daily.  180 tablet  3  . Multiple Vitamins-Minerals (MULTIVITAMINS THER. W/MINERALS) TABS Take 1 tablet by mouth daily.        Marland Kitchen olmesartan (BENICAR) 40 MG tablet Take 1 tablet (40 mg total) by mouth daily.  30 tablet  3  . omeprazole (PRILOSEC) 40 MG capsule Take 40 mg by mouth daily.        . polyethylene glycol powder (GLYCOLAX/MIRALAX) powder Take 8 g by mouth daily as needed. For constipation      . potassium chloride (K-DUR,KLOR-CON) 10 MEQ tablet Take 1 tablet (10 mEq total) by mouth 2 (two) times daily.  60 tablet  3  . timolol (BETIMOL) 0.5 % ophthalmic solution Place 1 drop into both eyes 2 (two) times daily.       . travoprost, benzalkonium, (TRAVATAN) 0.004 % ophthalmic solution Place 1 drop into the left eye at bedtime.       Marland Kitchen DISCONTD: dabigatran (PRADAXA) 75 MG CAPS Take 1 capsule (75 mg total) by mouth every 12 (twelve) hours.  180 capsule  3    Allergies  Allergen Reactions  . Diltiazem Other (See  Comments)    bradycardia    Past Medical History  Diagnosis Date  . Pneumonia 04/2010  . Hypertension   . Renal artery stenosis 2008    STATUS POST ANGIOPLASTY  . GERD (gastroesophageal reflux disease)   . Back pain     CHRONIC  . Atrial fibrillation     Prior TEE/CV in May 2012; managed with rate control and anticoagulation  . Hyponatremia     improved  . Seizure disorder     on anti-epileptic therapy  . Urinary incontinence   . Osteopenia   . SIADH (syndrome of inappropriate ADH production)     h/o HYPONATREMIA ; LIKELY RELATED TO HER LUNG PROCESS  . Arthritis   . Hiatal hernia   . Tachy-brady syndrome     with pauses noted in May 2012; s/p PTVP October 2012  . Dysphagia     Past Surgical History  Procedure Date  . Cholecystectomy   . Back surgery     X2  . Total knee arthroplasty     RIGHT AND LEFT KNEE  . Tonsillectomy   .  Appendectomy   . Cardiac catheterization 2007    Normal  . Knee arthroscopy     LEFT KNEE  . Cataract extraction   . Shoulder surgery   . Renal angioplasty 01/2007    RIGHT  . Partial hysterectomy   . Insert / replace / remove pacemaker 06/2011  . Eye surgery     cataracts removed on both eyes    History  Smoking status  . Never Smoker   Smokeless tobacco  . Never Used    History  Alcohol Use No    Family History  Problem Relation Age of Onset  . Stroke Mother   . Diabetes Mother   . Heart disease Mother     Unkown  . Stroke Father   . Diabetes Father   . Cancer Father     PROSTATE  . Cancer Brother     RENAL AND BLADDER  . Lung disease Brother   . Lung disease Sister   . Hypertension Daughter   . Hypertension Son     Review of Systems: The review of systems is per the HPI.  All other systems were reviewed and are negative.  Physical Exam: BP 126/68  Pulse 60  Ht 5\' 2"  (1.575 m)  Wt 149 lb (67.586 kg)  BMI 27.25 kg/m2 Patient is an elderly female who still looks a little frail. She is in no acute distress. Skin is warm and dry. Color is normal.  HEENT is unremarkable. Normocephalic/atraumatic. PERRL. Sclera are nonicteric. Neck is supple. No masses. No JVD. Lungs are clear. Cardiac exam shows a fairly regular rate and rhythm but with some irregularity. Abdomen is soft. Extremities are without edema. Gait and ROM are intact. No gross neurologic deficits noted.    Laboratory Data: N/A   Assessment / Plan:

## 2011-10-18 NOTE — Assessment & Plan Note (Signed)
Has had recent admission for diastolic heart failure with pneumonia. Slowly getting stronger. We discussed the need for salt restriction. She will continue to weigh daily. I have left her on her current regimen. We will see her back as scheduled in March. Patient is agreeable to this plan and will call if any problems develop in the interim.

## 2011-10-18 NOTE — Assessment & Plan Note (Signed)
She remains on her Pradaxa. No adverse bleeding or bruising reported.

## 2011-10-18 NOTE — Patient Instructions (Signed)
Try to increase your activity as tolerated.   Use your oxygen as needed.  Stay on your current medicines.  We will see you back in about 8 weeks.  Weigh yourself each morning and record. Take extra dose of diuretic for weight gain of 3 pounds in 24 hours.   Limit sodium intake. Goal is to have less than 2000 mg (2gm) of salt per day.  Call the [facility] office at [phone] if you have any questions, problems or concerns.

## 2011-10-26 DIAGNOSIS — Z961 Presence of intraocular lens: Secondary | ICD-10-CM | POA: Diagnosis not present

## 2011-10-26 DIAGNOSIS — H409 Unspecified glaucoma: Secondary | ICD-10-CM | POA: Diagnosis not present

## 2011-10-26 DIAGNOSIS — H401490 Capsular glaucoma with pseudoexfoliation of lens, unspecified eye, stage unspecified: Secondary | ICD-10-CM | POA: Diagnosis not present

## 2011-11-01 DIAGNOSIS — R0602 Shortness of breath: Secondary | ICD-10-CM | POA: Diagnosis not present

## 2011-11-01 DIAGNOSIS — I519 Heart disease, unspecified: Secondary | ICD-10-CM | POA: Diagnosis not present

## 2011-11-01 DIAGNOSIS — E871 Hypo-osmolality and hyponatremia: Secondary | ICD-10-CM | POA: Diagnosis not present

## 2011-11-01 DIAGNOSIS — I10 Essential (primary) hypertension: Secondary | ICD-10-CM | POA: Diagnosis not present

## 2011-11-03 ENCOUNTER — Encounter: Payer: Self-pay | Admitting: Cardiology

## 2011-11-23 ENCOUNTER — Other Ambulatory Visit (INDEPENDENT_AMBULATORY_CARE_PROVIDER_SITE_OTHER): Payer: Medicare Other

## 2011-11-23 DIAGNOSIS — R5381 Other malaise: Secondary | ICD-10-CM

## 2011-11-23 DIAGNOSIS — D044 Carcinoma in situ of skin of scalp and neck: Secondary | ICD-10-CM | POA: Diagnosis not present

## 2011-11-23 DIAGNOSIS — I4891 Unspecified atrial fibrillation: Secondary | ICD-10-CM | POA: Diagnosis not present

## 2011-11-23 DIAGNOSIS — L570 Actinic keratosis: Secondary | ICD-10-CM | POA: Diagnosis not present

## 2011-11-23 DIAGNOSIS — C44529 Squamous cell carcinoma of skin of other part of trunk: Secondary | ICD-10-CM | POA: Diagnosis not present

## 2011-11-23 DIAGNOSIS — R5383 Other fatigue: Secondary | ICD-10-CM

## 2011-11-23 DIAGNOSIS — D0420 Carcinoma in situ of skin of unspecified ear and external auricular canal: Secondary | ICD-10-CM | POA: Diagnosis not present

## 2011-11-23 DIAGNOSIS — C44320 Squamous cell carcinoma of skin of unspecified parts of face: Secondary | ICD-10-CM | POA: Diagnosis not present

## 2011-11-23 DIAGNOSIS — D0460 Carcinoma in situ of skin of unspecified upper limb, including shoulder: Secondary | ICD-10-CM | POA: Diagnosis not present

## 2011-11-23 DIAGNOSIS — D239 Other benign neoplasm of skin, unspecified: Secondary | ICD-10-CM | POA: Diagnosis not present

## 2011-11-23 DIAGNOSIS — C44221 Squamous cell carcinoma of skin of unspecified ear and external auricular canal: Secondary | ICD-10-CM | POA: Diagnosis not present

## 2011-11-23 LAB — HEPATIC FUNCTION PANEL
ALT: 26 U/L (ref 0–35)
Bilirubin, Direct: 0.2 mg/dL (ref 0.0–0.3)
Total Bilirubin: 0.3 mg/dL (ref 0.3–1.2)

## 2011-11-23 LAB — BASIC METABOLIC PANEL
Chloride: 96 mEq/L (ref 96–112)
Creatinine, Ser: 0.9 mg/dL (ref 0.4–1.2)
Sodium: 133 mEq/L — ABNORMAL LOW (ref 135–145)

## 2011-11-29 DIAGNOSIS — I519 Heart disease, unspecified: Secondary | ICD-10-CM | POA: Diagnosis not present

## 2011-11-29 DIAGNOSIS — I10 Essential (primary) hypertension: Secondary | ICD-10-CM | POA: Diagnosis not present

## 2011-11-29 DIAGNOSIS — J8410 Pulmonary fibrosis, unspecified: Secondary | ICD-10-CM | POA: Diagnosis not present

## 2011-11-29 DIAGNOSIS — R0602 Shortness of breath: Secondary | ICD-10-CM | POA: Diagnosis not present

## 2011-11-30 ENCOUNTER — Encounter: Payer: Self-pay | Admitting: Cardiology

## 2011-11-30 ENCOUNTER — Ambulatory Visit (INDEPENDENT_AMBULATORY_CARE_PROVIDER_SITE_OTHER): Payer: Medicare Other | Admitting: Cardiology

## 2011-11-30 VITALS — BP 142/62 | HR 63 | Ht 62.0 in | Wt 150.0 lb

## 2011-11-30 DIAGNOSIS — I5032 Chronic diastolic (congestive) heart failure: Secondary | ICD-10-CM | POA: Diagnosis not present

## 2011-11-30 DIAGNOSIS — I495 Sick sinus syndrome: Secondary | ICD-10-CM

## 2011-11-30 DIAGNOSIS — I4891 Unspecified atrial fibrillation: Secondary | ICD-10-CM | POA: Diagnosis not present

## 2011-11-30 DIAGNOSIS — I509 Heart failure, unspecified: Secondary | ICD-10-CM | POA: Diagnosis not present

## 2011-11-30 NOTE — Assessment & Plan Note (Signed)
She is status post pacemaker implant. She appears to be in sinus rhythm today with atrial pacing. She is asymptomatic. I see no advantage at this point to resuming amiodarone given the concern about possible pulmonary toxicity.

## 2011-11-30 NOTE — Assessment & Plan Note (Signed)
We will continue with rate control and anticoagulation with Pradaxa.

## 2011-11-30 NOTE — Patient Instructions (Signed)
Continue your current medication  I will see you again in 4 months.   

## 2011-11-30 NOTE — Progress Notes (Signed)
[name] Date of Birth: 06/25/1925 Medical Record #[id number]  History of Present Illness: [name] is seen today for a followup visit. She states she is really doing much better. She still gets short of breath at times with activity. She has had no cough or choking. Her lower extremity edema has improved significantly and she has lost 7 pounds. She denies any palpitations or dizziness.   Current Outpatient Prescriptions on File Prior to Visit  Medication Sig Dispense Refill  . Calcium Citrate (CITRACAL PO) Take 2 tablets by mouth daily.       . cholecalciferol (VITAMIN D) 1000 UNITS tablet Take 1,000 Units by mouth daily.        . dabigatran (PRADAXA) 75 MG CAPS Take 75 mg by mouth every 12 (twelve) hours.        . divalproex (DEPAKOTE) 500 MG EC tablet Take 500 mg by mouth at bedtime.        . furosemide (LASIX) 40 MG tablet Take 1 tablet (40 mg total) by mouth daily.  30 tablet  3  . HYDROcodone-acetaminophen (VICODIN) 5-500 MG per tablet Take 1 tablet by mouth every 6 (six) hours as needed. For pain       . methocarbamol (ROBAXIN) 500 MG tablet Take 500 mg by mouth 2 (two) times daily as needed. For pain      . metoprolol tartrate (LOPRESSOR) 25 MG tablet Take 1 tablet (25 mg total) by mouth 2 (two) times daily.  180 tablet  3  . Multiple Vitamins-Minerals (MULTIVITAMINS THER. W/MINERALS) TABS Take 1 tablet by mouth daily.        Marland Kitchen olmesartan (BENICAR) 40 MG tablet Take 1 tablet (40 mg total) by mouth daily.  30 tablet  3  . omeprazole (PRILOSEC) 40 MG capsule Take 40 mg by mouth daily.        . polyethylene glycol powder (GLYCOLAX/MIRALAX) powder Take 8 g by mouth daily as needed. For constipation      . potassium chloride (K-DUR,KLOR-CON) 10 MEQ tablet Take 1 tablet (10 mEq total) by mouth 2 (two) times daily.  60 tablet  3  . timolol (BETIMOL) 0.5 % ophthalmic solution Place 1 drop into both eyes 2 (two) times daily.       Marland Kitchen tolterodine (DETROL LA) 4 MG 24 hr capsule Take 4 mg by  mouth daily.      . travoprost, benzalkonium, (TRAVATAN) 0.004 % ophthalmic solution Place 1 drop into the left eye at bedtime.       Marland Kitchen DISCONTD: amLODipine (NORVASC) 10 MG tablet Take 1 tablet (10 mg total) by mouth daily.  30 tablet  3    Allergies  Allergen Reactions  . Diltiazem Other (See Comments)    bradycardia    Past Medical History  Diagnosis Date  . Pneumonia 04/2010  . Hypertension   . Renal artery stenosis 2008    STATUS POST ANGIOPLASTY  . GERD (gastroesophageal reflux disease)   . Back pain     CHRONIC  . Atrial fibrillation     Prior TEE/CV in May 2012; managed with rate control and anticoagulation  . Hyponatremia     improved  . Seizure disorder     on anti-epileptic therapy  . Urinary incontinence   . Osteopenia   . SIADH (syndrome of inappropriate ADH production)     h/o HYPONATREMIA ; LIKELY RELATED TO HER LUNG PROCESS  . Arthritis   . Hiatal hernia   . Tachy-brady syndrome  with pauses noted in May 2012; s/p PTVP October 2012  . Dysphagia     Past Surgical History  Procedure Date  . Cholecystectomy   . Back surgery     X2  . Total knee arthroplasty     RIGHT AND LEFT KNEE  . Tonsillectomy   . Appendectomy   . Cardiac catheterization 2007    Normal  . Knee arthroscopy     LEFT KNEE  . Cataract extraction   . Shoulder surgery   . Renal angioplasty 01/2007    RIGHT  . Partial hysterectomy   . Insert / replace / remove pacemaker 06/2011  . Eye surgery     cataracts removed on both eyes    History  Smoking status  . Never Smoker   Smokeless tobacco  . Never Used    History  Alcohol Use No    Family History  Problem Relation Age of Onset  . Stroke Mother   . Diabetes Mother   . Heart disease Mother     Unkown  . Stroke Father   . Diabetes Father   . Cancer Father     PROSTATE  . Cancer Brother     RENAL AND BLADDER  . Lung disease Brother   . Lung disease Sister   . Hypertension Daughter   . Hypertension Son      Review of Systems: The review of systems is per the HPI.  All other systems were reviewed and are negative.  Physical Exam: BP 142/62  Pulse 63  Ht 5\' 2"  (1.575 m)  Wt 150 lb (68.04 kg)  BMI 27.44 kg/m2 Patient is an elderly female who still looks a little frail. She is in no acute distress. Skin is warm and dry. Color is normal.  HEENT is unremarkable. Normocephalic/atraumatic. PERRL. Sclera are nonicteric. Neck is supple. No masses. No JVD. Lungs are clear. Cardiac exam shows a fairly regular rate and rhythm but with some irregularity. Abdomen is soft. Extremities are without edema. Gait and ROM are intact. No gross neurologic deficits noted.    Laboratory Data:  ECG today demonstrates an atrially paced rhythm. There is evidence of old septal infarction.   Assessment / Plan:

## 2011-11-30 NOTE — Assessment & Plan Note (Signed)
She has no significant volume overload on exam. She appears to have recovered nicely from her pneumonitis. She doesn't have any definite recurrent symptoms of aspiration.

## 2011-12-07 DIAGNOSIS — C44529 Squamous cell carcinoma of skin of other part of trunk: Secondary | ICD-10-CM | POA: Diagnosis not present

## 2011-12-07 DIAGNOSIS — C44320 Squamous cell carcinoma of skin of unspecified parts of face: Secondary | ICD-10-CM | POA: Diagnosis not present

## 2011-12-07 DIAGNOSIS — L570 Actinic keratosis: Secondary | ICD-10-CM | POA: Diagnosis not present

## 2011-12-11 DIAGNOSIS — C44320 Squamous cell carcinoma of skin of unspecified parts of face: Secondary | ICD-10-CM | POA: Diagnosis not present

## 2011-12-11 DIAGNOSIS — D0439 Carcinoma in situ of skin of other parts of face: Secondary | ICD-10-CM | POA: Diagnosis not present

## 2011-12-11 DIAGNOSIS — L988 Other specified disorders of the skin and subcutaneous tissue: Secondary | ICD-10-CM | POA: Diagnosis not present

## 2012-01-01 ENCOUNTER — Encounter: Payer: Self-pay | Admitting: Internal Medicine

## 2012-01-01 ENCOUNTER — Ambulatory Visit (INDEPENDENT_AMBULATORY_CARE_PROVIDER_SITE_OTHER): Payer: Medicare Other | Admitting: Internal Medicine

## 2012-01-01 VITALS — BP 142/60 | HR 74 | Resp 18 | Ht 62.0 in | Wt 156.0 lb

## 2012-01-01 DIAGNOSIS — I495 Sick sinus syndrome: Secondary | ICD-10-CM | POA: Diagnosis not present

## 2012-01-01 DIAGNOSIS — I4891 Unspecified atrial fibrillation: Secondary | ICD-10-CM

## 2012-01-01 LAB — PACEMAKER DEVICE OBSERVATION
AL IMPEDENCE PM: 460 Ohm
AL THRESHOLD: 1 V
ATRIAL PACING PM: 97
BAMS-0003: 60 {beats}/min
DEVICE MODEL PM: 7280874
RV LEAD THRESHOLD: 0.75 V

## 2012-01-01 MED ORDER — OLMESARTAN MEDOXOMIL 40 MG PO TABS: 40.0000 mg | ORAL_TABLET | Freq: Every day | ORAL | Status: DC

## 2012-01-01 NOTE — Assessment & Plan Note (Signed)
Normal pacemaker function See Pace Art report No changes today  

## 2012-01-01 NOTE — Assessment & Plan Note (Signed)
Maintaining sinus rhythm off of amiodarone Continue pradaxa

## 2012-01-01 NOTE — Patient Instructions (Signed)
Your physician wants you to follow-up in: October 2013 with Dr Johney Frame.  You will receive a reminder letter in the mail two months in advance. If you don't receive a letter, please call our office to schedule the follow-up appointment.

## 2012-01-01 NOTE — Progress Notes (Signed)
PCP:  [name], MD, MD  The patient presents today for routine electrophysiology followup.  Since last being seen in our clinic, the patient reports doing very well.  Today, she denies symptoms of palpitations, chest pain, shortness of breath,  lower extremity edema, dizziness, presyncope, syncope, or neurologic sequela.  The patient feels that she is tolerating medications without difficulties and is otherwise without complaint today.   Past Medical History  Diagnosis Date  . Pneumonia 04/2010  . Hypertension   . Renal artery stenosis 2008    STATUS POST ANGIOPLASTY  . GERD (gastroesophageal reflux disease)   . Back pain     CHRONIC  . Atrial fibrillation     Prior TEE/CV in May 2012; managed with rate control and anticoagulation  . Hyponatremia     improved  . Seizure disorder     on anti-epileptic therapy  . Urinary incontinence   . Osteopenia   . SIADH (syndrome of inappropriate ADH production)     h/o HYPONATREMIA ; LIKELY RELATED TO HER LUNG PROCESS  . Arthritis   . Hiatal hernia   . Tachy-brady syndrome     with pauses noted in May 2012; s/p PTVP October 2012  . Dysphagia    Past Surgical History  Procedure Date  . Cholecystectomy   . Back surgery     X2  . Total knee arthroplasty     RIGHT AND LEFT KNEE  . Tonsillectomy   . Appendectomy   . Cardiac catheterization 2007    Normal  . Knee arthroscopy     LEFT KNEE  . Cataract extraction   . Shoulder surgery   . Renal angioplasty 01/2007    RIGHT  . Partial hysterectomy   . Insert / replace / remove pacemaker 06/2011  . Eye surgery     cataracts removed on both eyes    Current Outpatient Prescriptions  Medication Sig Dispense Refill  . amLODipine (NORVASC) 10 MG tablet Take 5 mg by mouth daily.      . Calcium Citrate (CITRACAL PO) Take 2 tablets by mouth daily.       . cholecalciferol (VITAMIN D) 1000 UNITS tablet Take 1,000 Units by mouth daily.        . dabigatran (PRADAXA) 75 MG CAPS Take 75 mg by  mouth every 12 (twelve) hours.        . divalproex (DEPAKOTE) 500 MG EC tablet Take 500 mg by mouth at bedtime.        . furosemide (LASIX) 40 MG tablet Take 1 tablet (40 mg total) by mouth daily.  30 tablet  3  . HYDROcodone-acetaminophen (VICODIN) 5-500 MG per tablet Take 1 tablet by mouth every 6 (six) hours as needed. For pain       . methocarbamol (ROBAXIN) 500 MG tablet Take 500 mg by mouth 2 (two) times daily as needed. For pain      . metoprolol tartrate (LOPRESSOR) 25 MG tablet Take 1 tablet (25 mg total) by mouth 2 (two) times daily.  180 tablet  3  . Multiple Vitamins-Minerals (MULTIVITAMINS THER. W/MINERALS) TABS Take 1 tablet by mouth daily.        Marland Kitchen olmesartan (BENICAR) 40 MG tablet Take 1 tablet (40 mg total) by mouth daily.  90 tablet  3  . olmesartan (BENICAR) 40 MG tablet Take 1 tablet (40 mg total) by mouth daily.  90 tablet  3  . omeprazole (PRILOSEC) 40 MG capsule Take 40 mg by mouth daily.        Marland Kitchen  polyethylene glycol powder (GLYCOLAX/MIRALAX) powder Take 8 g by mouth daily as needed. For constipation      . potassium chloride (K-DUR,KLOR-CON) 10 MEQ tablet Take 1 tablet (10 mEq total) by mouth 2 (two) times daily.  60 tablet  3  . timolol (BETIMOL) 0.5 % ophthalmic solution Place 1 drop into both eyes 2 (two) times daily.       Marland Kitchen tolterodine (DETROL LA) 4 MG 24 hr capsule Take 4 mg by mouth daily.      . travoprost, benzalkonium, (TRAVATAN) 0.004 % ophthalmic solution Place 1 drop into the left eye at bedtime.       Marland Kitchen DISCONTD: olmesartan (BENICAR) 40 MG tablet Take 1 tablet (40 mg total) by mouth daily.  30 tablet  3    Allergies  Allergen Reactions  . Diltiazem Other (See Comments)    bradycardia    History   Social History  . Marital Status: Married    Spouse Name: N/A    Number of Children: 3  . Years of Education: 10   Occupational History  . [employer]    1980   Social History Main Topics  . Smoking status: Never Smoker   . Smokeless  tobacco: Never Used  . Alcohol Use: No  . Drug Use: No  . Sexually Active: No   Other Topics Concern  . Not on file   Social History Narrative   REMARRIED3 CHILDREN2 GRANDCHILDREN 4 GREAT GRANDCHILDREN10th GRADE EDUCATIONRETIRED FROM SEARS MAIL ORDER SINCE 1980\NO SMOKING, OR DRUG USEACTIVITY: AMBULATES WITH CANE OUTSIDE HOME DUE TO KNEE PAIN. ABLE TO AMBULATE WITHIN HOME, UP STAIRS WITHOUT INCIDENT AT BASELINE.     Family History  Problem Relation Age of Onset  . Stroke Mother   . Diabetes Mother   . Heart disease Mother     Unkown  . Stroke Father   . Diabetes Father   . Cancer Father     PROSTATE  . Cancer Brother     RENAL AND BLADDER  . Lung disease Brother   . Lung disease Sister   . Hypertension Daughter   . Hypertension Son     Physical Exam: Filed Vitals:   01/01/12 1432  BP: 142/60  Pulse: 74  Resp: 18  Height: 5\' 2"  (1.575 m)  Weight: 156 lb (70.761 kg)    GEN- The patient is well appearing, alert and oriented x 3 today.   Head- normocephalic, atraumatic Eyes-  Sclera clear, conjunctiva pink Ears- hearing intact Oropharynx- clear Neck- supple, no JVP Lymph- no cervical lymphadenopathy Lungs- Clear to ausculation bilaterally, normal work of breathing Chest- pacemaker pocket is well healed Heart- Regular rate and rhythm, no murmurs, rubs or gallops, PMI not laterally displaced GI- soft, NT, ND, + BS Extremities- no clubbing, cyanosis, or edema  Pacemaker interrogation- reviewed in detail today,  See PACEART report  Assessment and Plan:

## 2012-01-09 DIAGNOSIS — R569 Unspecified convulsions: Secondary | ICD-10-CM | POA: Diagnosis not present

## 2012-01-15 ENCOUNTER — Other Ambulatory Visit: Payer: Self-pay | Admitting: Physician Assistant

## 2012-03-11 DIAGNOSIS — R5383 Other fatigue: Secondary | ICD-10-CM | POA: Diagnosis not present

## 2012-03-11 DIAGNOSIS — I4891 Unspecified atrial fibrillation: Secondary | ICD-10-CM | POA: Diagnosis not present

## 2012-03-11 DIAGNOSIS — R5381 Other malaise: Secondary | ICD-10-CM | POA: Diagnosis not present

## 2012-03-11 DIAGNOSIS — I10 Essential (primary) hypertension: Secondary | ICD-10-CM | POA: Diagnosis not present

## 2012-03-11 DIAGNOSIS — J8410 Pulmonary fibrosis, unspecified: Secondary | ICD-10-CM | POA: Diagnosis not present

## 2012-04-18 ENCOUNTER — Encounter: Payer: Self-pay | Admitting: Cardiology

## 2012-04-18 ENCOUNTER — Ambulatory Visit (INDEPENDENT_AMBULATORY_CARE_PROVIDER_SITE_OTHER): Payer: Medicare Other | Admitting: Cardiology

## 2012-04-18 VITALS — BP 126/72 | HR 64 | Ht 62.0 in | Wt 151.0 lb

## 2012-04-18 DIAGNOSIS — Z7901 Long term (current) use of anticoagulants: Secondary | ICD-10-CM | POA: Diagnosis not present

## 2012-04-18 DIAGNOSIS — I10 Essential (primary) hypertension: Secondary | ICD-10-CM

## 2012-04-18 DIAGNOSIS — I4891 Unspecified atrial fibrillation: Secondary | ICD-10-CM

## 2012-04-18 DIAGNOSIS — I5032 Chronic diastolic (congestive) heart failure: Secondary | ICD-10-CM

## 2012-04-18 DIAGNOSIS — I509 Heart failure, unspecified: Secondary | ICD-10-CM

## 2012-04-18 DIAGNOSIS — I495 Sick sinus syndrome: Secondary | ICD-10-CM

## 2012-04-18 NOTE — Assessment & Plan Note (Signed)
She is well compensated on exam today without evidence of edema or rales. Continue with metoprolol, Benicar, and Lasix.

## 2012-04-18 NOTE — Patient Instructions (Signed)
Continue your current medication.  I will see you again in 6 months.   

## 2012-04-18 NOTE — Assessment & Plan Note (Signed)
She appears to be maintaining sinus rhythm well. She is on chronic anticoagulation. We may need to reconsider she has more frequent falls but generally she is doing well on this.

## 2012-04-18 NOTE — Assessment & Plan Note (Signed)
Blood pressure is under excellent control today. 

## 2012-04-18 NOTE — Progress Notes (Signed)
[name] Date of Birth: 06/25/1925 Medical Record #[id number]  History of Present Illness: [name] is seen today for a followup visit. She states she is doing well from a cardiac standpoint. She denies any chest pain. She does have some intermittent shortness of breath for which she uses oxygen occasionally. She did have a fall one week ago when she lost her balance. She landed on her buttocks and had some bruising on her arms as well. There were no fractures. She did not hit her head. She generally walks with a cane or a rolling walker. Her last pacemaker evaluation in April showed that she was maintaining sinus rhythm.   Current Outpatient Prescriptions on File Prior to Visit  Medication Sig Dispense Refill  . Calcium Citrate (CITRACAL PO) Take 2 tablets by mouth daily.       . cholecalciferol (VITAMIN D) 1000 UNITS tablet Take 1,000 Units by mouth daily.        . dabigatran (PRADAXA) 75 MG CAPS Take 75 mg by mouth every 12 (twelve) hours.        . divalproex (DEPAKOTE) 500 MG EC tablet Take 500 mg by mouth at bedtime.        . furosemide (LASIX) 40 MG tablet Take 1 tablet (40 mg total) by mouth daily.  30 tablet  3  . HYDROcodone-acetaminophen (VICODIN) 5-500 MG per tablet Take 1 tablet by mouth every 6 (six) hours as needed. For pain       . metoprolol tartrate (LOPRESSOR) 25 MG tablet Take 1 tablet (25 mg total) by mouth 2 (two) times daily.  180 tablet  3  . Multiple Vitamins-Minerals (MULTIVITAMINS THER. W/MINERALS) TABS Take 1 tablet by mouth daily.        Marland Kitchen olmesartan (BENICAR) 40 MG tablet Take 1 tablet (40 mg total) by mouth daily.  90 tablet  3  . pantoprazole (PROTONIX) 40 MG tablet Take 40 mg by mouth daily.      . polyethylene glycol powder (GLYCOLAX/MIRALAX) powder Take 8 g by mouth daily as needed. For constipation      . potassium chloride (K-DUR,KLOR-CON) 10 MEQ tablet Take 1 tablet (10 mEq total) by mouth 2 (two) times daily.  60 tablet  3  . timolol (BETIMOL) 0.5 %  ophthalmic solution Place 1 drop into both eyes 2 (two) times daily.       Marland Kitchen tolterodine (DETROL LA) 4 MG 24 hr capsule Take 4 mg by mouth daily.      . travoprost, benzalkonium, (TRAVATAN) 0.004 % ophthalmic solution Place 1 drop into the left eye at bedtime.       Marland Kitchen DISCONTD: BENICAR 40 MG tablet TAKE 1 TABLET EVERY DAY  30 tablet  3  . DISCONTD: olmesartan (BENICAR) 40 MG tablet Take 1 tablet (40 mg total) by mouth daily.  90 tablet  3    No Known Allergies  Past Medical History  Diagnosis Date  . Pneumonia 04/2010  . Hypertension   . Renal artery stenosis 2008    STATUS POST ANGIOPLASTY  . GERD (gastroesophageal reflux disease)   . Back pain     CHRONIC  . Atrial fibrillation     Prior TEE/CV in May 2012; managed with rate control and anticoagulation  . Hyponatremia     improved  . Seizure disorder     on anti-epileptic therapy  . Urinary incontinence   . Osteopenia   . SIADH (syndrome of inappropriate ADH production)     h/o HYPONATREMIA ;  LIKELY RELATED TO HER LUNG PROCESS  . Arthritis   . Hiatal hernia   . Tachy-brady syndrome     with pauses noted in May 2012; s/p PTVP October 2012  . Dysphagia     Past Surgical History  Procedure Date  . Cholecystectomy   . Back surgery     X2  . Total knee arthroplasty     RIGHT AND LEFT KNEE  . Tonsillectomy   . Appendectomy   . Cardiac catheterization 2007    Normal  . Knee arthroscopy     LEFT KNEE  . Cataract extraction   . Shoulder surgery   . Renal angioplasty 01/2007    RIGHT  . Partial hysterectomy   . Insert / replace / remove pacemaker 06/2011  . Eye surgery     cataracts removed on both eyes    History  Smoking status  . Never Smoker   Smokeless tobacco  . Never Used    History  Alcohol Use No    Family History  Problem Relation Age of Onset  . Stroke Mother   . Diabetes Mother   . Heart disease Mother     Unkown  . Stroke Father   . Diabetes Father   . Cancer Father     PROSTATE  .  Cancer Brother     RENAL AND BLADDER  . Lung disease Brother   . Lung disease Sister   . Hypertension Daughter   . Hypertension Son     Review of Systems: The review of systems is per the HPI.  All other systems were reviewed and are negative.  Physical Exam: BP 126/72  Pulse 64  Ht 5\' 2"  (1.575 m)  Wt 68.493 kg (151 lb)  BMI 27.62 kg/m2  SpO2 98% Patient is an elderly female who still looks a little frail. She is in no acute distress. Skin is warm and dry. Color is normal.  HEENT is unremarkable. Normocephalic/atraumatic. PERRL. Sclera are nonicteric. Neck is supple. No masses. No JVD. Lungs are clear. Cardiac exam shows a fairly regular rate and rhythm but with some irregularity. Abdomen is soft. Extremities are without edema. She has some bruising on her arms. Gait and ROM are intact. No gross neurologic deficits noted.    Laboratory Data:    Assessment / Plan:

## 2012-04-23 DIAGNOSIS — S20229A Contusion of unspecified back wall of thorax, initial encounter: Secondary | ICD-10-CM | POA: Diagnosis not present

## 2012-04-23 DIAGNOSIS — R269 Unspecified abnormalities of gait and mobility: Secondary | ICD-10-CM | POA: Diagnosis not present

## 2012-04-23 DIAGNOSIS — I10 Essential (primary) hypertension: Secondary | ICD-10-CM | POA: Diagnosis not present

## 2012-04-23 DIAGNOSIS — M549 Dorsalgia, unspecified: Secondary | ICD-10-CM | POA: Diagnosis not present

## 2012-04-23 DIAGNOSIS — IMO0002 Reserved for concepts with insufficient information to code with codable children: Secondary | ICD-10-CM | POA: Diagnosis not present

## 2012-04-24 DIAGNOSIS — L570 Actinic keratosis: Secondary | ICD-10-CM | POA: Diagnosis not present

## 2012-04-24 DIAGNOSIS — Z85828 Personal history of other malignant neoplasm of skin: Secondary | ICD-10-CM | POA: Diagnosis not present

## 2012-04-24 DIAGNOSIS — L821 Other seborrheic keratosis: Secondary | ICD-10-CM | POA: Diagnosis not present

## 2012-05-02 DIAGNOSIS — Z961 Presence of intraocular lens: Secondary | ICD-10-CM | POA: Diagnosis not present

## 2012-05-02 DIAGNOSIS — H401490 Capsular glaucoma with pseudoexfoliation of lens, unspecified eye, stage unspecified: Secondary | ICD-10-CM | POA: Diagnosis not present

## 2012-05-02 DIAGNOSIS — H4011X0 Primary open-angle glaucoma, stage unspecified: Secondary | ICD-10-CM | POA: Diagnosis not present

## 2012-05-03 DIAGNOSIS — M5137 Other intervertebral disc degeneration, lumbosacral region: Secondary | ICD-10-CM | POA: Diagnosis not present

## 2012-05-03 DIAGNOSIS — M25569 Pain in unspecified knee: Secondary | ICD-10-CM | POA: Diagnosis not present

## 2012-05-21 DIAGNOSIS — M25569 Pain in unspecified knee: Secondary | ICD-10-CM | POA: Diagnosis not present

## 2012-05-23 DIAGNOSIS — M48061 Spinal stenosis, lumbar region without neurogenic claudication: Secondary | ICD-10-CM | POA: Diagnosis not present

## 2012-05-23 DIAGNOSIS — M5137 Other intervertebral disc degeneration, lumbosacral region: Secondary | ICD-10-CM | POA: Diagnosis not present

## 2012-05-29 DIAGNOSIS — S8010XA Contusion of unspecified lower leg, initial encounter: Secondary | ICD-10-CM | POA: Diagnosis not present

## 2012-05-30 DIAGNOSIS — M25569 Pain in unspecified knee: Secondary | ICD-10-CM | POA: Diagnosis not present

## 2012-06-06 DIAGNOSIS — M25569 Pain in unspecified knee: Secondary | ICD-10-CM | POA: Diagnosis not present

## 2012-06-13 DIAGNOSIS — M25569 Pain in unspecified knee: Secondary | ICD-10-CM | POA: Diagnosis not present

## 2012-06-18 DIAGNOSIS — J8410 Pulmonary fibrosis, unspecified: Secondary | ICD-10-CM | POA: Diagnosis not present

## 2012-06-18 DIAGNOSIS — I10 Essential (primary) hypertension: Secondary | ICD-10-CM | POA: Diagnosis not present

## 2012-06-18 DIAGNOSIS — E871 Hypo-osmolality and hyponatremia: Secondary | ICD-10-CM | POA: Diagnosis not present

## 2012-06-18 DIAGNOSIS — Z23 Encounter for immunization: Secondary | ICD-10-CM | POA: Diagnosis not present

## 2012-06-18 DIAGNOSIS — R5383 Other fatigue: Secondary | ICD-10-CM | POA: Diagnosis not present

## 2012-06-20 DIAGNOSIS — M545 Low back pain: Secondary | ICD-10-CM | POA: Diagnosis not present

## 2012-06-20 DIAGNOSIS — M48061 Spinal stenosis, lumbar region without neurogenic claudication: Secondary | ICD-10-CM | POA: Diagnosis not present

## 2012-06-24 ENCOUNTER — Other Ambulatory Visit: Payer: Self-pay | Admitting: Cardiology

## 2012-06-26 DIAGNOSIS — M25569 Pain in unspecified knee: Secondary | ICD-10-CM | POA: Diagnosis not present

## 2012-06-28 DIAGNOSIS — M1710 Unilateral primary osteoarthritis, unspecified knee: Secondary | ICD-10-CM | POA: Diagnosis not present

## 2012-07-01 ENCOUNTER — Encounter: Payer: Medicare Other | Admitting: Internal Medicine

## 2012-07-03 ENCOUNTER — Other Ambulatory Visit: Payer: Self-pay | Admitting: *Deleted

## 2012-07-03 ENCOUNTER — Encounter: Payer: Self-pay | Admitting: *Deleted

## 2012-07-03 DIAGNOSIS — Z48812 Encounter for surgical aftercare following surgery on the circulatory system: Secondary | ICD-10-CM

## 2012-07-03 DIAGNOSIS — I701 Atherosclerosis of renal artery: Secondary | ICD-10-CM

## 2012-07-03 DIAGNOSIS — Z950 Presence of cardiac pacemaker: Secondary | ICD-10-CM | POA: Insufficient documentation

## 2012-07-09 ENCOUNTER — Ambulatory Visit (INDEPENDENT_AMBULATORY_CARE_PROVIDER_SITE_OTHER): Payer: Medicare Other | Admitting: Vascular Surgery

## 2012-07-09 ENCOUNTER — Ambulatory Visit (INDEPENDENT_AMBULATORY_CARE_PROVIDER_SITE_OTHER): Payer: Medicare Other | Admitting: *Deleted

## 2012-07-09 ENCOUNTER — Encounter: Payer: Self-pay | Admitting: Vascular Surgery

## 2012-07-09 VITALS — BP 159/71 | HR 60 | Resp 18 | Ht 62.0 in | Wt 143.4 lb

## 2012-07-09 DIAGNOSIS — Z48812 Encounter for surgical aftercare following surgery on the circulatory system: Secondary | ICD-10-CM | POA: Diagnosis not present

## 2012-07-09 DIAGNOSIS — M1710 Unilateral primary osteoarthritis, unspecified knee: Secondary | ICD-10-CM | POA: Diagnosis not present

## 2012-07-09 DIAGNOSIS — I701 Atherosclerosis of renal artery: Secondary | ICD-10-CM | POA: Diagnosis not present

## 2012-07-09 NOTE — Progress Notes (Signed)
The patient has today for followup of her right renal artery angioplasty in 2008. Since my last visit with her one year ago she has had a pacemaker placed. She otherwise is doing well and is remains active. She does have well-controlled hypertension.  Past Medical History  Diagnosis Date  . Pneumonia 04/2010  . Hypertension   . Renal artery stenosis 2008    STATUS POST ANGIOPLASTY  . GERD (gastroesophageal reflux disease)   . Back pain     CHRONIC  . Atrial fibrillation     Prior TEE/CV in May 2012; managed with rate control and anticoagulation  . Hyponatremia     improved  . Seizure disorder     on anti-epileptic therapy  . Urinary incontinence   . Osteopenia   . SIADH (syndrome of inappropriate ADH production)     h/o HYPONATREMIA ; LIKELY RELATED TO HER LUNG PROCESS  . Arthritis   . Hiatal hernia   . Tachy-brady syndrome     with pauses noted in May 2012; s/p PTVP October 2012  . Dysphagia     History  Substance Use Topics  . Smoking status: Never Smoker   . Smokeless tobacco: Never Used  . Alcohol Use: No    Family History  Problem Relation Age of Onset  . Stroke Mother   . Diabetes Mother   . Heart disease Mother     Unkown  . Stroke Father   . Diabetes Father   . Cancer Father     PROSTATE  . Cancer Brother     RENAL AND BLADDER  . Lung disease Brother   . Lung disease Sister   . Hypertension Daughter   . Hypertension Son     No Known Allergies  Current outpatient prescriptions:Calcium Citrate (CITRACAL PO), Take 2 tablets by mouth daily. , Disp: , Rfl: ;  cholecalciferol (VITAMIN D) 1000 UNITS tablet, Take 1,000 Units by mouth daily.  , Disp: , Rfl: ;  dabigatran (PRADAXA) 75 MG CAPS, Take 75 mg by mouth every 12 (twelve) hours.  , Disp: , Rfl: ;  divalproex (DEPAKOTE) 500 MG EC tablet, Take 500 mg by mouth at bedtime.  , Disp: , Rfl:  furosemide (LASIX) 40 MG tablet, Take 1 tablet (40 mg total) by mouth daily., Disp: 30 tablet, Rfl: 3;   HYDROcodone-acetaminophen (VICODIN) 5-500 MG per tablet, Take 1 tablet by mouth every 6 (six) hours as needed. For pain , Disp: , Rfl: ;  metoprolol tartrate (LOPRESSOR) 25 MG tablet, Take 1 tablet (25 mg total) by mouth 2 (two) times daily., Disp: 180 tablet, Rfl: 3 Multiple Vitamins-Minerals (MULTIVITAMINS THER. W/MINERALS) TABS, Take 1 tablet by mouth daily.  , Disp: , Rfl: ;  olmesartan (BENICAR) 40 MG tablet, Take 1 tablet (40 mg total) by mouth daily., Disp: 90 tablet, Rfl: 3;  pantoprazole (PROTONIX) 40 MG tablet, Take 40 mg by mouth daily., Disp: , Rfl: ;  polyethylene glycol powder (GLYCOLAX/MIRALAX) powder, Take 8 g by mouth daily as needed. For constipation, Disp: , Rfl:  potassium chloride (K-DUR,KLOR-CON) 10 MEQ tablet, Take 1 tablet (10 mEq total) by mouth 2 (two) times daily., Disp: 60 tablet, Rfl: 3;  timolol (BETIMOL) 0.5 % ophthalmic solution, Place 1 drop into both eyes 2 (two) times daily. , Disp: , Rfl: ;  tolterodine (DETROL LA) 4 MG 24 hr capsule, Take 4 mg by mouth daily., Disp: , Rfl:  travoprost, benzalkonium, (TRAVATAN) 0.004 % ophthalmic solution, Place 1 drop into the left eye at  bedtime. , Disp: , Rfl: ;  amLODipine (NORVASC) 5 MG tablet, Take 5 mg by mouth daily., Disp: , Rfl:   BP 159/71  Pulse 60  Resp 18  Ht 5\' 2"  (1.575 m)  Wt 143 lb 6.4 oz (65.046 kg)  BMI 26.23 kg/m2  SpO2 97%  Body mass index is 26.23 kg/(m^2).       Physical exam: Well-developed well-nourished white female appearing stated age Abdomen soft nontender and no abdominal bruits Respirations equal bilaterally Palpable femoral pulses bilaterally   Renal duplex was undertaken in our office today. I reviewed this with the patient and her husband present. This does show widely patent renal arteries bilaterally. She has normal renal size bilaterally.  Impression and plan no evidence of renal artery stenosis status post right renal angioplasty in 2008. I discussed this with the patient. I feel  comfortable discontinuing her yearly renal duplex. I explained that even if she did have some recurrent stenosis over time that we would not intervene in that she had recurrence of her severe hypertension. I explained that this did occur we would reinitiate evaluation with duplex. They are comfortable with this plan and will see [place] again on an as-needed basis

## 2012-07-11 ENCOUNTER — Ambulatory Visit (INDEPENDENT_AMBULATORY_CARE_PROVIDER_SITE_OTHER): Payer: Medicare Other | Admitting: Internal Medicine

## 2012-07-11 ENCOUNTER — Encounter: Payer: Self-pay | Admitting: Internal Medicine

## 2012-07-11 VITALS — BP 128/82 | HR 65 | Ht 62.0 in | Wt 144.0 lb

## 2012-07-11 DIAGNOSIS — Z950 Presence of cardiac pacemaker: Secondary | ICD-10-CM | POA: Diagnosis not present

## 2012-07-11 DIAGNOSIS — I4891 Unspecified atrial fibrillation: Secondary | ICD-10-CM | POA: Diagnosis not present

## 2012-07-11 DIAGNOSIS — I495 Sick sinus syndrome: Secondary | ICD-10-CM | POA: Diagnosis not present

## 2012-07-11 MED ORDER — OLMESARTAN MEDOXOMIL 40 MG PO TABS: 40.0000 mg | ORAL_TABLET | Freq: Every day | ORAL | Status: DC

## 2012-07-11 MED ORDER — DABIGATRAN ETEXILATE MESYLATE 75 MG PO CAPS: 75.0000 mg | ORAL_CAPSULE | Freq: Two times a day (BID) | ORAL | Status: DC

## 2012-07-11 MED ORDER — FUROSEMIDE 40 MG PO TABS: 40.0000 mg | ORAL_TABLET | Freq: Every day | ORAL | Status: DC

## 2012-07-11 MED ORDER — POTASSIUM CHLORIDE CRYS ER 10 MEQ PO TBCR: 10.0000 meq | EXTENDED_RELEASE_TABLET | Freq: Two times a day (BID) | ORAL | Status: DC

## 2012-07-11 MED ORDER — AMLODIPINE BESYLATE 5 MG PO TABS: 5.0000 mg | ORAL_TABLET | Freq: Every day | ORAL | Status: DC

## 2012-07-11 MED ORDER — METOPROLOL TARTRATE 25 MG PO TABS: 25.0000 mg | ORAL_TABLET | Freq: Two times a day (BID) | ORAL | Status: DC

## 2012-07-11 NOTE — Progress Notes (Signed)
PCP: [name], MD Primary Cardiologist:  Dr Swaziland  [name] is a 78 y.o. female who presents today for routine electrophysiology followup.  Since last being seen in our clinic, the patient reports doing very well.  She has made amazing recovery from her prior pneumonia.  She has recently been to her beach house and remains active.  She does have gait instability and has had several recent falls.  Today, she denies symptoms of palpitations, chest pain, shortness of breath,  lower extremity edema, dizziness, presyncope, or syncope.  The patient is otherwise without complaint today.   Past Medical History  Diagnosis Date  . Pneumonia 04/2010  . Hypertension   . Renal artery stenosis 2008    STATUS POST ANGIOPLASTY  . GERD (gastroesophageal reflux disease)   . Back pain     CHRONIC  . Atrial fibrillation     Prior TEE/CV in May 2012; managed with rate control and anticoagulation  . Hyponatremia     improved  . Seizure disorder     on anti-epileptic therapy  . Urinary incontinence   . Osteopenia   . SIADH (syndrome of inappropriate ADH production)     h/o HYPONATREMIA ; LIKELY RELATED TO HER LUNG PROCESS  . Arthritis   . Hiatal hernia   . Tachy-brady syndrome     with pauses noted in May 2012; s/p PTVP October 2012  . Dysphagia    Past Surgical History  Procedure Date  . Cholecystectomy   . Back surgery     X2  . Total knee arthroplasty     RIGHT AND LEFT KNEE  . Tonsillectomy   . Appendectomy   . Cardiac catheterization 2007    Normal  . Knee arthroscopy     LEFT KNEE  . Cataract extraction   . Shoulder surgery   . Renal angioplasty 01/2007    RIGHT  . Partial hysterectomy   . Pacemaker insertion 06/2011    SJM Accent DR RF implanted by Dr Johney Frame  . Eye surgery     cataracts removed on both eyes    Current Outpatient Prescriptions  Medication Sig Dispense Refill  . amLODipine (NORVASC) 5 MG tablet Take 5 mg by mouth daily.      . Calcium Citrate  (CITRACAL PO) Take 2 tablets by mouth daily.       . cholecalciferol (VITAMIN D) 1000 UNITS tablet Take 1,000 Units by mouth daily.        . dabigatran (PRADAXA) 75 MG CAPS Take 75 mg by mouth every 12 (twelve) hours.        . divalproex (DEPAKOTE) 500 MG EC tablet Take 500 mg by mouth at bedtime.        . furosemide (LASIX) 40 MG tablet Take 1 tablet (40 mg total) by mouth daily.  30 tablet  3  . HYDROcodone-acetaminophen (VICODIN) 5-500 MG per tablet Take 1 tablet by mouth every 6 (six) hours as needed. For pain       . metoprolol tartrate (LOPRESSOR) 25 MG tablet Take 1 tablet (25 mg total) by mouth 2 (two) times daily.  180 tablet  3  . Multiple Vitamins-Minerals (MULTIVITAMINS THER. W/MINERALS) TABS Take 1 tablet by mouth daily.        Marland Kitchen olmesartan (BENICAR) 40 MG tablet Take 1 tablet (40 mg total) by mouth daily.  90 tablet  3  . pantoprazole (PROTONIX) 40 MG tablet Take 40 mg by mouth daily.      . polyethylene  glycol powder (GLYCOLAX/MIRALAX) powder Take 8 g by mouth daily as needed. For constipation      . potassium chloride (K-DUR,KLOR-CON) 10 MEQ tablet Take 1 tablet (10 mEq total) by mouth 2 (two) times daily.  60 tablet  3  . timolol (BETIMOL) 0.5 % ophthalmic solution Place 1 drop into both eyes 2 (two) times daily.       Marland Kitchen tolterodine (DETROL LA) 4 MG 24 hr capsule Take 4 mg by mouth daily.      . travoprost, benzalkonium, (TRAVATAN) 0.004 % ophthalmic solution Place 1 drop into the left eye at bedtime.         Physical Exam: Filed Vitals:   07/11/12 1042  BP: 128/82  Pulse: 65  Height: 5\' 2"  (1.575 m)  Weight: 144 lb (65.318 kg)  SpO2: 95%    GEN- The patient is well appearing, alert and oriented x 3 today.   Head- normocephalic, atraumatic Eyes-  Sclera clear, conjunctiva pink Ears- hearing intact Oropharynx- clear Lungs- Clear to ausculation bilaterally, normal work of breathing Chest- pacemaker pocket is well healed Heart- Regular rate and rhythm, no murmurs,  rubs or gallops, PMI not laterally displaced GI- soft, NT, ND, + BS Extremities- no clubbing, cyanosis, or edema  Pacemaker interrogation- reviewed in detail today,  See PACEART report  Assessment and Plan:  1. Bradycardia Normal pacemaker function See Pace Art report No changes today  2. afib- 3% afib burden She is on pradaxa 75mg  BID Given her advanced age and risks, I will enroll her in our anticoagulation clinic for close follow-up with Weston Brass.  She needs BMET and CBC at that time.  She will continue to follow regularly with Dr Swaziland and I will see again in a year

## 2012-07-11 NOTE — Patient Instructions (Addendum)
Remote monitoring is used to monitor your Pacemaker of ICD from home. This monitoring reduces the number of office visits required to check your device to one time per year. It allows [place] to keep an eye on the functioning of your device to ensure it is working properly. You are scheduled for a device check from home on October 14, 2012. You may send your transmission at any time that day. If you have a wireless device, the transmission will be sent automatically. After your physician reviews your transmission, you will receive a postcard with your next transmission date.  Your physician wants you to follow-up in: 1 year with Dr Johney Frame.  You will receive a reminder letter in the mail two months in advance. If you don't receive a letter, please call our office to schedule the follow-up appointment.  See Kennon Rounds in CVRR next 1-2 weeks to review anti-coagulation

## 2012-07-12 DIAGNOSIS — M1710 Unilateral primary osteoarthritis, unspecified knee: Secondary | ICD-10-CM | POA: Diagnosis not present

## 2012-07-15 DIAGNOSIS — M1710 Unilateral primary osteoarthritis, unspecified knee: Secondary | ICD-10-CM | POA: Diagnosis not present

## 2012-07-17 DIAGNOSIS — M1710 Unilateral primary osteoarthritis, unspecified knee: Secondary | ICD-10-CM | POA: Diagnosis not present

## 2012-07-23 DIAGNOSIS — M1710 Unilateral primary osteoarthritis, unspecified knee: Secondary | ICD-10-CM | POA: Diagnosis not present

## 2012-07-25 DIAGNOSIS — M1710 Unilateral primary osteoarthritis, unspecified knee: Secondary | ICD-10-CM | POA: Diagnosis not present

## 2012-07-26 ENCOUNTER — Ambulatory Visit: Payer: Medicare Other | Admitting: Pharmacist

## 2012-07-26 ENCOUNTER — Ambulatory Visit (INDEPENDENT_AMBULATORY_CARE_PROVIDER_SITE_OTHER): Payer: Medicare Other | Admitting: Pharmacist

## 2012-07-26 DIAGNOSIS — I4891 Unspecified atrial fibrillation: Secondary | ICD-10-CM | POA: Diagnosis not present

## 2012-07-26 LAB — CBC WITH DIFFERENTIAL/PLATELET
Basophils Absolute: 0 10*3/uL (ref 0.0–0.1)
HCT: 40.5 % (ref 36.0–46.0)
Lymphs Abs: 1.7 10*3/uL (ref 0.7–4.0)
Monocytes Relative: 15 % — ABNORMAL HIGH (ref 3.0–12.0)
Neutrophils Relative %: 58.2 % (ref 43.0–77.0)
Platelets: 201 10*3/uL (ref 150.0–400.0)
RDW: 18.4 % — ABNORMAL HIGH (ref 11.5–14.6)

## 2012-07-26 LAB — BASIC METABOLIC PANEL
BUN: 18 mg/dL (ref 6–23)
Calcium: 9.5 mg/dL (ref 8.4–10.5)
Creatinine, Ser: 1.2 mg/dL (ref 0.4–1.2)
GFR: 43.69 mL/min — ABNORMAL LOW (ref >60.00)
Glucose, Bld: 108 mg/dL — ABNORMAL HIGH (ref 70–99)
Potassium: 4.1 mEq/L (ref 3.5–5.1)

## 2012-07-26 NOTE — Progress Notes (Signed)
Pt was started on Pradaxa 75mg  BID for Atrial Fibrillation on June 28, 2011.  She does not report any problems since she has been on Pradaxa.  No bleeding or other side effects such as dyspepsia.   She does not report any issues affording the medication or it causing her to hit the donut hole.   Reviewed patients medication list.  Pt is not currently on any combined P-gp and strong CYP3A4 inhibitors/inducers (ketoconazole, traconazole, ritonavir, carbamazepine, phenytoin, rifampin, St. John's wort).  Pt has not had labs drawn since March of this year.  Will draw today to check dosing of Pradaxa.   Reviewed labs.  SCr 1.2, Weight 69kg, CrCl- 51mL/min. Since patient has borderline CrCl and is elderly, will continue at 75mg  BID.  If follow SCr improves, may need to increase dose to 150mg .  Hgb and HCT Within Normal Limits  A full discussion of the nature of anticoagulants has been carried out.  A benefit/risk analysis has been presented to the patient, so that they understand the justification for choosing anticoagulation with Praxada at this time.  The need for compliance is stressed.  Pt is aware to take the medication twice daily.  Side effects of potential bleeding are discussed, including unusual colored urine or stools, coughing up blood or coffee ground emesis, nose bleeds or serious fall or head trauma.  Discussed signs and symptoms of stroke. The patient should avoid any OTC items containing aspirin or ibuprofen.  Avoid alcohol consumption.   Call if any signs of abnormal bleeding.  Discussed financial obligations and resolved any difficulty in obtaining medication.  Asked pt to call MD if any medications change or she is scheduled for a procedure. Next lab test test in 6 months.

## 2012-07-30 DIAGNOSIS — M1710 Unilateral primary osteoarthritis, unspecified knee: Secondary | ICD-10-CM | POA: Diagnosis not present

## 2012-08-01 DIAGNOSIS — M1710 Unilateral primary osteoarthritis, unspecified knee: Secondary | ICD-10-CM | POA: Diagnosis not present

## 2012-08-09 DIAGNOSIS — M1990 Unspecified osteoarthritis, unspecified site: Secondary | ICD-10-CM | POA: Diagnosis not present

## 2012-09-12 DIAGNOSIS — M48061 Spinal stenosis, lumbar region without neurogenic claudication: Secondary | ICD-10-CM | POA: Diagnosis not present

## 2012-09-12 DIAGNOSIS — M545 Low back pain: Secondary | ICD-10-CM | POA: Diagnosis not present

## 2012-09-25 ENCOUNTER — Encounter: Payer: Self-pay | Admitting: Internal Medicine

## 2012-10-02 DIAGNOSIS — K219 Gastro-esophageal reflux disease without esophagitis: Secondary | ICD-10-CM | POA: Diagnosis not present

## 2012-10-02 DIAGNOSIS — I10 Essential (primary) hypertension: Secondary | ICD-10-CM | POA: Diagnosis not present

## 2012-10-02 DIAGNOSIS — Z950 Presence of cardiac pacemaker: Secondary | ICD-10-CM | POA: Diagnosis not present

## 2012-10-02 DIAGNOSIS — Z7901 Long term (current) use of anticoagulants: Secondary | ICD-10-CM | POA: Diagnosis not present

## 2012-10-02 DIAGNOSIS — R072 Precordial pain: Secondary | ICD-10-CM | POA: Diagnosis not present

## 2012-10-02 DIAGNOSIS — R32 Unspecified urinary incontinence: Secondary | ICD-10-CM | POA: Diagnosis present

## 2012-10-02 DIAGNOSIS — I509 Heart failure, unspecified: Secondary | ICD-10-CM | POA: Diagnosis not present

## 2012-10-02 DIAGNOSIS — I4891 Unspecified atrial fibrillation: Secondary | ICD-10-CM | POA: Diagnosis not present

## 2012-10-02 DIAGNOSIS — J8410 Pulmonary fibrosis, unspecified: Secondary | ICD-10-CM | POA: Diagnosis not present

## 2012-10-02 DIAGNOSIS — R079 Chest pain, unspecified: Secondary | ICD-10-CM | POA: Diagnosis not present

## 2012-10-02 DIAGNOSIS — R0789 Other chest pain: Secondary | ICD-10-CM | POA: Diagnosis not present

## 2012-10-02 DIAGNOSIS — H409 Unspecified glaucoma: Secondary | ICD-10-CM | POA: Diagnosis not present

## 2012-10-02 DIAGNOSIS — I517 Cardiomegaly: Secondary | ICD-10-CM | POA: Diagnosis not present

## 2012-10-03 ENCOUNTER — Telehealth: Payer: Self-pay | Admitting: Cardiology

## 2012-10-03 NOTE — Telephone Encounter (Signed)
New Problem:    Patient's husband called in because his wife was in the hospital in [facility] and was discharged today.  She had chest pains and was in Afib.  Would like to know if she needed to be seen by Dr. Swaziland soon.  Please call back.

## 2012-10-03 NOTE — Telephone Encounter (Signed)
Spoke to patient's husband he stated wife was discharged today 10/03/12 from [facility] in [place].States he had to take wife to the ER there and she was admitted with chest pain and atrial fib.States she is doing better, just very weak.States they were told to make appointment with Dr.Jordan.States they will not be back home until Tuesday 10/08/12.Appointment scheduled with Norma Fredrickson NP 10/10/12 at 9:30 am.States they will bring patient's hospital records with them to appointment.

## 2012-10-11 ENCOUNTER — Other Ambulatory Visit: Payer: Self-pay | Admitting: Nurse Practitioner

## 2012-10-11 ENCOUNTER — Encounter: Payer: Self-pay | Admitting: Nurse Practitioner

## 2012-10-11 ENCOUNTER — Ambulatory Visit (INDEPENDENT_AMBULATORY_CARE_PROVIDER_SITE_OTHER): Payer: Medicare Other | Admitting: Nurse Practitioner

## 2012-10-11 VITALS — BP 106/58 | HR 78 | Resp 28 | Ht 62.0 in | Wt 151.8 lb

## 2012-10-11 DIAGNOSIS — I4891 Unspecified atrial fibrillation: Secondary | ICD-10-CM

## 2012-10-11 LAB — CBC WITH DIFFERENTIAL/PLATELET
Basophils Absolute: 0 10*3/uL (ref 0.0–0.1)
Basophils Relative: 0.3 % (ref 0.0–3.0)
Eosinophils Absolute: 0.1 10*3/uL (ref 0.0–0.7)
Eosinophils Relative: 1.1 % (ref 0.0–5.0)
HCT: 37.4 % (ref 36.0–46.0)
Hemoglobin: 12.4 g/dL (ref 12.0–15.0)
Lymphocytes Relative: 19.7 % (ref 12.0–46.0)
Lymphs Abs: 1.1 10*3/uL (ref 0.7–4.0)
MCHC: 33.1 g/dL (ref 30.0–36.0)
MCV: 90.6 fl (ref 78.0–100.0)
Monocytes Absolute: 0.7 10*3/uL (ref 0.1–1.0)
Monocytes Relative: 12.5 % — ABNORMAL HIGH (ref 3.0–12.0)
Neutro Abs: 3.8 10*3/uL (ref 1.4–7.7)
Neutrophils Relative %: 66.4 % (ref 43.0–77.0)
Platelets: 164 10*3/uL (ref 150.0–400.0)
RBC: 4.13 Mil/uL (ref 3.87–5.11)
RDW: 16.2 % — ABNORMAL HIGH (ref 11.5–14.6)
WBC: 5.7 10*3/uL (ref 4.5–10.5)

## 2012-10-11 LAB — BASIC METABOLIC PANEL
BUN: 19 mg/dL (ref 6–23)
CO2: 32 mEq/L (ref 19–32)
Calcium: 9.4 mg/dL (ref 8.4–10.5)
Chloride: 93 mEq/L — ABNORMAL LOW (ref 96–112)
Creatinine, Ser: 1.2 mg/dL (ref 0.4–1.2)
GFR: 44.49 mL/min — ABNORMAL LOW (ref >60.00)
Glucose, Bld: 86 mg/dL (ref 70–99)
Potassium: 4.3 mEq/L (ref 3.5–5.1)
Sodium: 133 mEq/L — ABNORMAL LOW (ref 135–145)

## 2012-10-11 LAB — PROTIME-INR
INR: 1.4 ratio — ABNORMAL HIGH (ref 0.8–1.0)
Prothrombin Time: 14.2 s — ABNORMAL HIGH (ref 10.2–12.4)

## 2012-10-11 LAB — APTT: aPTT: 55.2 s — ABNORMAL HIGH (ref 21.7–28.8)

## 2012-10-11 NOTE — Addendum Note (Signed)
Addended by: Worthy Rancher D on: 10/11/2012 10:08 AM   Modules accepted: Orders

## 2012-10-11 NOTE — Patient Instructions (Addendum)
We need to arrange for a cardioversion next week to try and get you back in rhythm  For now, stay on your current medicines  We will need to check labs today  Call the [facility] office at [phone] if you have any questions, problems or concerns.

## 2012-10-11 NOTE — Progress Notes (Addendum)
[name] Date of Birth: 06/25/1925 Medical Record #[id number]  History of Present Illness: [name] is seen back today for a post hospital visit. She is seen for Dr. Swaziland. She has CHF, tachy brady with atrial fib, remains on anticoagulation. She is 87. She has a pacemaker in place. Last seen here in October by Dr. Johney Frame (only 3% burden of AF noted at that time) and back in August by Dr. Swaziland. She was cardioverted successfully in October of 2012. She has been on chronic Pradaxa. Last echo in 2012 showed a normal EF.  She comes in today. She is here with her husband. She was at the beach last week. Started feeling "wierd" one morning last week at breakfast and was short of breath and having chest pain. Went to the hospital at [facility]. Found to be in atrial fib with RVR. Negative troponins and negative Lexiscan noted. Lopressor was increased while there but she has not increased her dose since coming home. Still feels bad. Weak and short of breath. Less chest pain. No known CAD. No recent falls. Remains on her Pradaxa.   Current Outpatient Prescriptions on File Prior to Visit  Medication Sig Dispense Refill  . amLODipine (NORVASC) 5 MG tablet Take 1 tablet (5 mg total) by mouth daily.  90 tablet  4  . Calcium Citrate (CITRACAL PO) Take 2 tablets by mouth daily.       . cholecalciferol (VITAMIN D) 1000 UNITS tablet Take 1,000 Units by mouth daily.        . dabigatran (PRADAXA) 75 MG CAPS Take 1 capsule (75 mg total) by mouth every 12 (twelve) hours.  180 capsule  4  . divalproex (DEPAKOTE) 500 MG EC tablet Take 500 mg by mouth at bedtime.        . furosemide (LASIX) 40 MG tablet Take 1 tablet (40 mg total) by mouth daily.  90 tablet  4  . HYDROcodone-acetaminophen (VICODIN) 5-500 MG per tablet Take 1 tablet by mouth every 6 (six) hours as needed. For pain       . metoprolol tartrate (LOPRESSOR) 25 MG tablet Take 1 tablet (25 mg total) by mouth 2 (two) times daily.  180 tablet  4  .  Multiple Vitamins-Minerals (MULTIVITAMINS THER. W/MINERALS) TABS Take 1 tablet by mouth daily.        Marland Kitchen olmesartan (BENICAR) 40 MG tablet Take 1 tablet (40 mg total) by mouth daily.  90 tablet  4  . pantoprazole (PROTONIX) 40 MG tablet Take 40 mg by mouth daily.      . polyethylene glycol powder (GLYCOLAX/MIRALAX) powder Take 8 g by mouth daily as needed. For constipation      . potassium chloride (K-DUR,KLOR-CON) 10 MEQ tablet Take 1 tablet (10 mEq total) by mouth 2 (two) times daily.  180 tablet  4  . timolol (BETIMOL) 0.5 % ophthalmic solution Place 1 drop into both eyes 2 (two) times daily.       Marland Kitchen tolterodine (DETROL LA) 4 MG 24 hr capsule Take 4 mg by mouth daily.      . travoprost, benzalkonium, (TRAVATAN) 0.004 % ophthalmic solution Place 1 drop into the left eye at bedtime.         No Known Allergies  Past Medical History  Diagnosis Date  . Pneumonia 04/2010  . Hypertension   . Renal artery stenosis 2008    STATUS POST ANGIOPLASTY  . GERD (gastroesophageal reflux disease)   . Back pain  CHRONIC  . Atrial fibrillation     Prior TEE/CV in May 2012; managed with rate control and anticoagulation  . Hyponatremia     improved  . Seizure disorder     on anti-epileptic therapy  . Urinary incontinence   . Osteopenia   . SIADH (syndrome of inappropriate ADH production)     h/o HYPONATREMIA ; LIKELY RELATED TO HER LUNG PROCESS  . Arthritis   . Hiatal hernia   . Tachy-brady syndrome     with pauses noted in May 2012; s/p PTVP October 2012  . Dysphagia     Past Surgical History  Procedure Date  . Cholecystectomy   . Back surgery     X2  . Total knee arthroplasty     RIGHT AND LEFT KNEE  . Tonsillectomy   . Appendectomy   . Cardiac catheterization 2007    Normal  . Knee arthroscopy     LEFT KNEE  . Cataract extraction   . Shoulder surgery   . Renal angioplasty 01/2007    RIGHT  . Partial hysterectomy   . Pacemaker insertion 06/2011    SJM Accent DR RF implanted by  Dr Johney Frame  . Eye surgery     cataracts removed on both eyes    History  Smoking status  . Never Smoker   Smokeless tobacco  . Never Used    History  Alcohol Use No    Family History  Problem Relation Age of Onset  . Stroke Mother   . Diabetes Mother   . Heart disease Mother     Unkown  . Stroke Father   . Diabetes Father   . Cancer Father     PROSTATE  . Cancer Brother     RENAL AND BLADDER  . Lung disease Brother   . Lung disease Sister   . Hypertension Daughter   . Hypertension Son     Review of Systems: The review of systems is per the HPI.  All other systems were reviewed and are negative.  Physical Exam: BP 106/58  Pulse 78  Resp 28  Ht 5\' 2"  (1.575 m)  Wt 151 lb 12 oz (68.833 kg)  BMI 27.76 kg/m2  SpO2 98% Patient is an elderly female who looks chronically ill but in no acute distress. Skin is warm and dry. Color is normal.  HEENT is unremarkable. Normocephalic/atraumatic. PERRL. Sclera are nonicteric. Neck is supple. No masses. No JVD. Lungs are clear. Cardiac exam shows a rather fast regular rate and rhythm. Abdomen is soft. Extremities are without edema. Gait and ROM are intact. No gross neurologic deficits noted.  LABORATORY DATA: EKG today shows underlying atrial fib, rate of 73 with pacing noted.  Records from [facility] are reviewed.  Negative troponins Negative Lexiscan BNP was elevated at 705  Assessment / Plan: 1. Recurrent atrial fib - on Pradaxa - I have talked with Dr. Swaziland - will arrange for cardioversion for next week. This procedure is reviewed and she is willing to proceed.   2. Underlying pacemaker  3. HTN  4. Advanced age   77. Chronic anticoagulation - she has remained on her Pradaxa and has not missed any doses.   Patient is agreeable to this plan and will call if any problems develop in the interim.

## 2012-10-14 ENCOUNTER — Encounter (HOSPITAL_COMMUNITY): Payer: Self-pay | Admitting: Gastroenterology

## 2012-10-14 ENCOUNTER — Ambulatory Visit (HOSPITAL_COMMUNITY)
Admission: RE | Admit: 2012-10-14 | Discharge: 2012-10-14 | Disposition: A | Discharge status: Home / Self Care | Payer: Medicare Other | Source: Ambulatory Visit | Attending: Cardiovascular Disease | Admitting: Cardiovascular Disease

## 2012-10-14 ENCOUNTER — Ambulatory Visit (INDEPENDENT_AMBULATORY_CARE_PROVIDER_SITE_OTHER): Payer: Medicare Other | Admitting: *Deleted

## 2012-10-14 ENCOUNTER — Encounter (HOSPITAL_COMMUNITY): Payer: Self-pay | Admitting: Anesthesiology

## 2012-10-14 ENCOUNTER — Ambulatory Visit (HOSPITAL_COMMUNITY): Payer: Medicare Other | Admitting: Anesthesiology

## 2012-10-14 ENCOUNTER — Encounter: Payer: Self-pay | Admitting: Internal Medicine

## 2012-10-14 ENCOUNTER — Encounter (HOSPITAL_COMMUNITY)
Admission: RE | Disposition: A | Discharge status: Home / Self Care | Payer: Self-pay | Source: Ambulatory Visit | Attending: Cardiovascular Disease

## 2012-10-14 DIAGNOSIS — Z950 Presence of cardiac pacemaker: Secondary | ICD-10-CM | POA: Diagnosis not present

## 2012-10-14 DIAGNOSIS — I495 Sick sinus syndrome: Secondary | ICD-10-CM

## 2012-10-14 DIAGNOSIS — I10 Essential (primary) hypertension: Secondary | ICD-10-CM | POA: Insufficient documentation

## 2012-10-14 DIAGNOSIS — Z7901 Long term (current) use of anticoagulants: Secondary | ICD-10-CM | POA: Insufficient documentation

## 2012-10-14 DIAGNOSIS — I4891 Unspecified atrial fibrillation: Secondary | ICD-10-CM | POA: Diagnosis not present

## 2012-10-14 HISTORY — PX: CARDIOVERSION: SHX1299

## 2012-10-14 LAB — REMOTE PACEMAKER DEVICE
AL IMPEDENCE PM: 430 Ohm
ATRIAL PACING PM: 89
BAMS-0001: 150 {beats}/min
DEVICE MODEL PM: 7280874
RV LEAD IMPEDENCE PM: 660 Ohm

## 2012-10-14 SURGERY — CARDIOVERSION
Anesthesia: Monitor Anesthesia Care | Wound class: Clean

## 2012-10-14 MED ORDER — LIDOCAINE HCL (CARDIAC) 20 MG/ML IV SOLN: INTRAVENOUS | Status: DC | PRN

## 2012-10-14 MED ORDER — LIDOCAINE HCL (CARDIAC) 20 MG/ML IV SOLN
INTRAVENOUS | Status: DC | PRN
  Administered 2012-10-14: 70 mg via INTRAVENOUS

## 2012-10-14 MED ORDER — PROPOFOL 10 MG/ML IV BOLUS
INTRAVENOUS | Status: DC | PRN
  Administered 2012-10-14: 50 mg via INTRAVENOUS

## 2012-10-14 MED ORDER — SODIUM CHLORIDE 0.45 % IV SOLN
INTRAVENOUS | Status: DC
  Administered 2012-10-14: 500 mL via INTRAVENOUS

## 2012-10-14 MED ORDER — DEXTROSE-NACL 5-0.45 % IV SOLN: INTRAVENOUS | Status: DC

## 2012-10-14 MED ORDER — SODIUM CHLORIDE 0.9 % IV SOLN
INTRAVENOUS | Status: DC | PRN
  Administered 2012-10-14: 14:00:00 via INTRAVENOUS

## 2012-10-14 NOTE — Anesthesia Preprocedure Evaluation (Addendum)
Anesthesia Evaluation  Patient identified by MRN, date of birth, ID band Patient awake    Reviewed: Allergy & Precautions, H&P , NPO status , Patient's Chart, lab work & pertinent test results, reviewed documented beta blocker date and time   Airway Mallampati: II TM Distance: >3 FB Neck ROM: Full    Dental  (+) Teeth Intact   Pulmonary shortness of breath,          Cardiovascular hypertension, Pt. on medications and Pt. on home beta blockers + Peripheral Vascular Disease and +CHF + dysrhythmias Atrial Fibrillation + pacemaker     Neuro/Psych    GI/Hepatic hiatal hernia, GERD-  Poorly Controlled,  Endo/Other    Renal/GU      Musculoskeletal   Abdominal   Peds  Hematology   Anesthesia Other Findings   Reproductive/Obstetrics                         Anesthesia Physical Anesthesia Plan  ASA: III  Anesthesia Plan: MAC   Post-op Pain Management:    Induction: Intravenous  Airway Management Planned: Mask  Additional Equipment:   Intra-op Plan:   Post-operative Plan:   Informed Consent: I have reviewed the patients History and Physical, chart, labs and discussed the procedure including the risks, benefits and alternatives for the proposed anesthesia with the patient or authorized representative who has indicated his/her understanding and acceptance.     Plan Discussed with: CRNA  Anesthesia Plan Comments:         Anesthesia Quick Evaluation

## 2012-10-14 NOTE — Anesthesia Postprocedure Evaluation (Signed)
  Anesthesia Post-op Note  Patient: [name]  Procedure(s) Performed: Procedure(s) (LRB) with comments: CARDIOVERSION (N/A)  Patient Location: PACU  Anesthesia Type:General  Level of Consciousness: awake, alert , oriented and patient cooperative  Airway and Oxygen Therapy: Patient Spontanous Breathing and Patient connected to nasal cannula oxygen  Post-op Pain: none  Post-op Assessment: Post-op Vital signs reviewed, PATIENT'S CARDIOVASCULAR STATUS UNSTABLE, RESPIRATORY FUNCTION UNSTABLE, Patent Airway and No signs of Nausea or vomiting  Post-op Vital Signs: Reviewed and stable  Complications: No apparent anesthesia complications

## 2012-10-14 NOTE — Preoperative (Signed)
Beta Blockers   Reason not to administer Beta Blockers:Metoprolol 0730 this morning

## 2012-10-14 NOTE — Transfer of Care (Signed)
Immediate Anesthesia Transfer of Care Note  Patient: [name]  Procedure(s) Performed: Procedure(s) (LRB) with comments: CARDIOVERSION (N/A)  Patient Location: PACU  Anesthesia Type:General  Level of Consciousness: awake, alert , oriented and patient cooperative  Airway & Oxygen Therapy: Patient Spontanous Breathing and Patient connected to nasal cannula oxygen  Post-op Assessment: Report given to PACU RN, Post -op Vital signs reviewed and stable and Patient moving all extremities  Post vital signs: Reviewed and stable  Complications: No apparent anesthesia complications

## 2012-10-14 NOTE — Interval H&P Note (Signed)
History and Physical Interval Note:  10/14/2012 1:23 PM  [name]  has presented today for surgery, with the diagnosis of a-fib  The various methods of treatment have been discussed with the patient and family. After consideration of risks, benefits and other options for treatment, the patient has consented to  Procedure(s) (LRB) with comments: CARDIOVERSION (N/A) as a surgical intervention .  The patient's history has been reviewed, patient examined, no change in status, stable for surgery.  I have reviewed the patient's chart and labs.  Questions were answered to the patient's satisfaction.     Elyn Aquas.

## 2012-10-14 NOTE — Interval H&P Note (Signed)
History and Physical Interval Note:  10/14/2012 2:27 PM  [name]  has presented today for surgery, with the diagnosis of a-fib  The various methods of treatment have been discussed with the patient and family. After consideration of risks, benefits and other options for treatment, the patient has consented to  Procedure(s) (LRB) with comments: CARDIOVERSION (N/A) as a surgical intervention .  The patient's history has been reviewed, patient examined, no change in status, stable for surgery.  I have reviewed the patient's chart and labs.  Questions were answered to the patient's satisfaction.     Elyn Aquas.

## 2012-10-14 NOTE — CV Procedure (Signed)
    Cardioversion Note  [name] [id number] 06/25/1925  Procedure: DC Cardioversion Indications: atrial fib  Procedure Details Consent: Obtained Time Out: Verified patient identification, verified procedure, site/side was marked, verified correct patient position, special equipment/implants available, Radiology Safety Procedures followed,  medications/allergies/relevent history reviewed, required imaging and test results available.  Performed  The patient has been on adequate anticoagulation.  The patient received IV Propofol 50 mg for sedation.  Synchronous cardioversion was performed at 120  joules.  The cardioversion was successful.    Complications: No apparent complications Patient did tolerate procedure well.   Vesta Mixer, Montez Hageman., MD, [facility] 10/14/2012, 2:32 PM

## 2012-10-14 NOTE — H&P (View-Only) (Signed)
[name] Date of Birth: 06/25/1925 Medical Record #[id number]  History of Present Illness: [name] is seen back today for a post hospital visit. She is seen for Dr. Swaziland. She has CHF, tachy brady with atrial fib, remains on anticoagulation. She is 79. She has a pacemaker in place. Last seen here in October by Dr. Johney Frame (only 3% burden of AF noted at that time) and back in August by Dr. Swaziland. She was cardioverted successfully in October of 2012. She has been on chronic Pradaxa. Last echo in 2012 showed a normal EF.  She comes in today. She is here with her husband. She was at the beach last week. Started feeling "wierd" one morning last week at breakfast and was short of breath and having chest pain. Went to the hospital at [facility]. Found to be in atrial fib with RVR. Negative troponins and negative Lexiscan noted. Lopressor was increased while there but she has not increased her dose since coming home. Still feels bad. Weak and short of breath. Less chest pain. No known CAD. No recent falls. Remains on her Pradaxa.   Current Outpatient Prescriptions on File Prior to Visit  Medication Sig Dispense Refill  . amLODipine (NORVASC) 5 MG tablet Take 1 tablet (5 mg total) by mouth daily.  90 tablet  4  . Calcium Citrate (CITRACAL PO) Take 2 tablets by mouth daily.       . cholecalciferol (VITAMIN D) 1000 UNITS tablet Take 1,000 Units by mouth daily.        . dabigatran (PRADAXA) 75 MG CAPS Take 1 capsule (75 mg total) by mouth every 12 (twelve) hours.  180 capsule  4  . divalproex (DEPAKOTE) 500 MG EC tablet Take 500 mg by mouth at bedtime.        . furosemide (LASIX) 40 MG tablet Take 1 tablet (40 mg total) by mouth daily.  90 tablet  4  . HYDROcodone-acetaminophen (VICODIN) 5-500 MG per tablet Take 1 tablet by mouth every 6 (six) hours as needed. For pain       . metoprolol tartrate (LOPRESSOR) 25 MG tablet Take 1 tablet (25 mg total) by mouth 2 (two) times daily.  180 tablet  4  .  Multiple Vitamins-Minerals (MULTIVITAMINS THER. W/MINERALS) TABS Take 1 tablet by mouth daily.        Marland Kitchen olmesartan (BENICAR) 40 MG tablet Take 1 tablet (40 mg total) by mouth daily.  90 tablet  4  . pantoprazole (PROTONIX) 40 MG tablet Take 40 mg by mouth daily.      . polyethylene glycol powder (GLYCOLAX/MIRALAX) powder Take 8 g by mouth daily as needed. For constipation      . potassium chloride (K-DUR,KLOR-CON) 10 MEQ tablet Take 1 tablet (10 mEq total) by mouth 2 (two) times daily.  180 tablet  4  . timolol (BETIMOL) 0.5 % ophthalmic solution Place 1 drop into both eyes 2 (two) times daily.       Marland Kitchen tolterodine (DETROL LA) 4 MG 24 hr capsule Take 4 mg by mouth daily.      . travoprost, benzalkonium, (TRAVATAN) 0.004 % ophthalmic solution Place 1 drop into the left eye at bedtime.         No Known Allergies  Past Medical History  Diagnosis Date  . Pneumonia 04/2010  . Hypertension   . Renal artery stenosis 2008    STATUS POST ANGIOPLASTY  . GERD (gastroesophageal reflux disease)   . Back pain  CHRONIC  . Atrial fibrillation     Prior TEE/CV in May 2012; managed with rate control and anticoagulation  . Hyponatremia     improved  . Seizure disorder     on anti-epileptic therapy  . Urinary incontinence   . Osteopenia   . SIADH (syndrome of inappropriate ADH production)     h/o HYPONATREMIA ; LIKELY RELATED TO HER LUNG PROCESS  . Arthritis   . Hiatal hernia   . Tachy-brady syndrome     with pauses noted in May 2012; s/p PTVP October 2012  . Dysphagia     Past Surgical History  Procedure Date  . Cholecystectomy   . Back surgery     X2  . Total knee arthroplasty     RIGHT AND LEFT KNEE  . Tonsillectomy   . Appendectomy   . Cardiac catheterization 2007    Normal  . Knee arthroscopy     LEFT KNEE  . Cataract extraction   . Shoulder surgery   . Renal angioplasty 01/2007    RIGHT  . Partial hysterectomy   . Pacemaker insertion 06/2011    SJM Accent DR RF implanted by  Dr Johney Frame  . Eye surgery     cataracts removed on both eyes    History  Smoking status  . Never Smoker   Smokeless tobacco  . Never Used    History  Alcohol Use No    Family History  Problem Relation Age of Onset  . Stroke Mother   . Diabetes Mother   . Heart disease Mother     Unkown  . Stroke Father   . Diabetes Father   . Cancer Father     PROSTATE  . Cancer Brother     RENAL AND BLADDER  . Lung disease Brother   . Lung disease Sister   . Hypertension Daughter   . Hypertension Son     Review of Systems: The review of systems is per the HPI.  All other systems were reviewed and are negative.  Physical Exam: BP 106/58  Pulse 78  Resp 28  Ht 5\' 2"  (1.575 m)  Wt 151 lb 12 oz (68.833 kg)  BMI 27.76 kg/m2  SpO2 98% Patient is an elderly female who looks chronically ill but in no acute distress. Skin is warm and dry. Color is normal.  HEENT is unremarkable. Normocephalic/atraumatic. PERRL. Sclera are nonicteric. Neck is supple. No masses. No JVD. Lungs are clear. Cardiac exam shows a rather fast regular rate and rhythm. Abdomen is soft. Extremities are without edema. Gait and ROM are intact. No gross neurologic deficits noted.  LABORATORY DATA: EKG today shows  Records from [facility] are reviewed.  Negative troponins Negative Lexiscan BNP was elevated at 705  Assessment / Plan: 1. Recurrent atrial fib - on Pradaxa - I have talked with Dr. Swaziland - will arrange for cardioversion for next week. This procedure is reviewed and she is willing to proceed.   2. Underlying pacemaker  3. HTN  4. Advanced age   79. Chronic anticoagulation - she has remained on her Pradaxa and has not missed any doses.   Patient is agreeable to this plan and will call if any problems develop in the interim.

## 2012-10-15 ENCOUNTER — Encounter: Payer: Self-pay | Admitting: *Deleted

## 2012-10-15 ENCOUNTER — Encounter (HOSPITAL_COMMUNITY): Payer: Self-pay | Admitting: Cardiovascular Disease

## 2012-10-28 ENCOUNTER — Telehealth: Payer: Self-pay | Admitting: Cardiology

## 2012-10-28 NOTE — Telephone Encounter (Signed)
Spoke with pt's husband. He is not sure who originally prescribed oxygen for pt but it is time to have oxygen certification renewed. He would like to know if Dr. Swaziland would like for pt to continue oxygen and if so could renewal  be done through our office. Husband reports pt uses oxygen as needed. Usually uses it every other day. Will send to Dr. Swaziland

## 2012-10-28 NOTE — Telephone Encounter (Signed)
New Problem    Pt has concerns about oxygen use. Certification has run out. Would like to speak to nurse about this and if oxygen use is still necessary.

## 2012-10-29 DIAGNOSIS — Z961 Presence of intraocular lens: Secondary | ICD-10-CM | POA: Diagnosis not present

## 2012-10-29 DIAGNOSIS — H0230 Blepharochalasis unspecified eye, unspecified eyelid: Secondary | ICD-10-CM | POA: Diagnosis not present

## 2012-10-29 DIAGNOSIS — H401490 Capsular glaucoma with pseudoexfoliation of lens, unspecified eye, stage unspecified: Secondary | ICD-10-CM | POA: Diagnosis not present

## 2012-10-29 NOTE — Telephone Encounter (Signed)
She was started on oxygen over a year ago. Oxygen Sats recorded in record are 98%. I don't know if this is with or without oxygen. She will need documentation of resting and exertional oxygen sats on RA in order to qualify for ongoing home oxygen.  Peter Swaziland MD, [facility]

## 2012-10-29 NOTE — Telephone Encounter (Signed)
Follow-up:    Please fax all relevant info to Attn: Shon Hough [phone].

## 2012-11-04 NOTE — Telephone Encounter (Signed)
Spoke to patient's husband was told will call [facility] 11/07/11 and check to see if patient will qualify for reordering O2.

## 2012-11-06 ENCOUNTER — Telehealth: Payer: Self-pay | Admitting: Cardiology

## 2012-11-06 NOTE — Telephone Encounter (Signed)
I think [name] called her.

## 2012-11-06 NOTE — Telephone Encounter (Signed)
New Problem:    Returning [name]'s call form the other day.  Please call back.

## 2012-11-07 NOTE — Telephone Encounter (Signed)
Spoke with husband was told I called [facility] patient does not need 02 renewed.

## 2012-11-07 NOTE — Telephone Encounter (Signed)
See previous 11/07/12 note.

## 2012-11-14 ENCOUNTER — Other Ambulatory Visit: Payer: Self-pay

## 2012-12-03 ENCOUNTER — Other Ambulatory Visit: Payer: Self-pay

## 2012-12-03 ENCOUNTER — Other Ambulatory Visit: Payer: Self-pay | Admitting: *Deleted

## 2012-12-03 ENCOUNTER — Telehealth: Payer: Self-pay

## 2012-12-03 DIAGNOSIS — I5032 Chronic diastolic (congestive) heart failure: Secondary | ICD-10-CM

## 2012-12-03 NOTE — Telephone Encounter (Signed)
Opened in error

## 2012-12-04 ENCOUNTER — Ambulatory Visit
Admission: RE | Admit: 2012-12-04 | Discharge: 2012-12-04 | Disposition: A | Discharge status: Home / Self Care | Payer: Medicare Other | Source: Ambulatory Visit

## 2012-12-04 DIAGNOSIS — Z1231 Encounter for screening mammogram for malignant neoplasm of breast: Secondary | ICD-10-CM

## 2012-12-05 ENCOUNTER — Other Ambulatory Visit: Payer: Self-pay

## 2012-12-12 ENCOUNTER — Other Ambulatory Visit: Payer: Self-pay | Admitting: *Deleted

## 2012-12-17 DIAGNOSIS — I519 Heart disease, unspecified: Secondary | ICD-10-CM | POA: Diagnosis not present

## 2012-12-17 DIAGNOSIS — I10 Essential (primary) hypertension: Secondary | ICD-10-CM | POA: Diagnosis not present

## 2012-12-17 DIAGNOSIS — I4891 Unspecified atrial fibrillation: Secondary | ICD-10-CM | POA: Diagnosis not present

## 2012-12-17 DIAGNOSIS — M949 Disorder of cartilage, unspecified: Secondary | ICD-10-CM | POA: Diagnosis not present

## 2012-12-17 DIAGNOSIS — R0602 Shortness of breath: Secondary | ICD-10-CM | POA: Diagnosis not present

## 2012-12-17 DIAGNOSIS — Z1331 Encounter for screening for depression: Secondary | ICD-10-CM | POA: Diagnosis not present

## 2012-12-17 DIAGNOSIS — M899 Disorder of bone, unspecified: Secondary | ICD-10-CM | POA: Diagnosis not present

## 2012-12-18 DIAGNOSIS — I10 Essential (primary) hypertension: Secondary | ICD-10-CM | POA: Diagnosis not present

## 2012-12-24 DIAGNOSIS — R350 Frequency of micturition: Secondary | ICD-10-CM | POA: Diagnosis not present

## 2012-12-24 DIAGNOSIS — N3941 Urge incontinence: Secondary | ICD-10-CM | POA: Diagnosis not present

## 2012-12-24 DIAGNOSIS — I509 Heart failure, unspecified: Secondary | ICD-10-CM | POA: Diagnosis not present

## 2012-12-24 DIAGNOSIS — I10 Essential (primary) hypertension: Secondary | ICD-10-CM | POA: Diagnosis not present

## 2013-01-13 ENCOUNTER — Ambulatory Visit (INDEPENDENT_AMBULATORY_CARE_PROVIDER_SITE_OTHER): Payer: Medicare Other | Admitting: *Deleted

## 2013-01-13 ENCOUNTER — Encounter: Payer: Self-pay | Admitting: Internal Medicine

## 2013-01-13 DIAGNOSIS — I495 Sick sinus syndrome: Secondary | ICD-10-CM

## 2013-01-13 DIAGNOSIS — Z950 Presence of cardiac pacemaker: Secondary | ICD-10-CM

## 2013-01-15 DIAGNOSIS — I10 Essential (primary) hypertension: Secondary | ICD-10-CM | POA: Diagnosis not present

## 2013-01-15 DIAGNOSIS — I519 Heart disease, unspecified: Secondary | ICD-10-CM | POA: Diagnosis not present

## 2013-01-24 ENCOUNTER — Ambulatory Visit (INDEPENDENT_AMBULATORY_CARE_PROVIDER_SITE_OTHER): Payer: Medicare Other

## 2013-01-24 DIAGNOSIS — I4891 Unspecified atrial fibrillation: Secondary | ICD-10-CM | POA: Diagnosis not present

## 2013-01-24 DIAGNOSIS — Z7901 Long term (current) use of anticoagulants: Secondary | ICD-10-CM

## 2013-01-24 DIAGNOSIS — L821 Other seborrheic keratosis: Secondary | ICD-10-CM | POA: Diagnosis not present

## 2013-01-24 DIAGNOSIS — L570 Actinic keratosis: Secondary | ICD-10-CM | POA: Diagnosis not present

## 2013-01-24 DIAGNOSIS — C44621 Squamous cell carcinoma of skin of unspecified upper limb, including shoulder: Secondary | ICD-10-CM | POA: Diagnosis not present

## 2013-01-24 DIAGNOSIS — Z85828 Personal history of other malignant neoplasm of skin: Secondary | ICD-10-CM | POA: Diagnosis not present

## 2013-01-24 DIAGNOSIS — D485 Neoplasm of uncertain behavior of skin: Secondary | ICD-10-CM | POA: Diagnosis not present

## 2013-01-24 LAB — CBC
Hemoglobin: 12.3 g/dL (ref 12.0–15.0)
MCH: 28.3 pg (ref 26.0–34.0)
RBC: 4.34 MIL/uL (ref 3.87–5.11)

## 2013-01-24 NOTE — Progress Notes (Signed)
Pt was started on Pradaxa for atrial fibrillation on 06/28/11 75mg  BID by Dr Swaziland.  Reviewed patients medication list.  Pt is not currently on any combined P-gp and strong CYP3A4 inhibitors/inducers (ketoconazole, traconazole, ritonavir, carbamazepine, phenytoin, rifampin, St. John's wort).  Reviewed labs.  SCr-0.9 , Weight-69kg, CrCl-44.8 .  Dose is not appropriate based on CrCl, pt's dosage needs to be increased to 150mg  BID.   Hgb and HCT 12.3/36.5 WNL.  Called spoke with pt advised to increase Pradaxa dosage to 150mg  BID.  New Rx sent to [facility] with increased strength tablet.  Pt aware.

## 2013-01-24 NOTE — Patient Instructions (Addendum)
A full discussion of the nature of anticoagulants has been carried out.  A benefit/risk analysis has been presented to the patient, so that they understand the justification for choosing anticoagulation with Praxada at this time.  The need for compliance is stressed.  Pt is aware to take the medication twice daily.  Side effects of potential bleeding are discussed, including unusual colored urine or stools, coughing up blood or coffee ground emesis, nose bleeds or serious fall or head trauma.  Discussed signs and symptoms of stroke. The patient should avoid any OTC items containing aspirin or ibuprofen.  Avoid alcohol consumption.   Call if any signs of abnormal bleeding.  Discussed financial obligations and resolved any difficulty in obtaining medication.  Asked pt to call MD if any medications change or he is scheduled for a procedure. Next lab test test in 6 months.    

## 2013-01-27 MED ORDER — DABIGATRAN ETEXILATE MESYLATE 150 MG PO CAPS: 150.0000 mg | ORAL_CAPSULE | Freq: Two times a day (BID) | ORAL | Status: DC

## 2013-01-29 DIAGNOSIS — R3989 Other symptoms and signs involving the genitourinary system: Secondary | ICD-10-CM | POA: Diagnosis not present

## 2013-01-29 DIAGNOSIS — N3941 Urge incontinence: Secondary | ICD-10-CM | POA: Diagnosis not present

## 2013-01-29 DIAGNOSIS — R32 Unspecified urinary incontinence: Secondary | ICD-10-CM | POA: Diagnosis not present

## 2013-01-29 DIAGNOSIS — R350 Frequency of micturition: Secondary | ICD-10-CM | POA: Diagnosis not present

## 2013-01-29 DIAGNOSIS — R351 Nocturia: Secondary | ICD-10-CM | POA: Diagnosis not present

## 2013-02-05 ENCOUNTER — Encounter: Payer: Self-pay | Admitting: *Deleted

## 2013-02-28 ENCOUNTER — Telehealth: Payer: Self-pay | Admitting: Cardiology

## 2013-02-28 ENCOUNTER — Inpatient Hospital Stay (HOSPITAL_COMMUNITY): Payer: Medicare Other

## 2013-02-28 ENCOUNTER — Ambulatory Visit (INDEPENDENT_AMBULATORY_CARE_PROVIDER_SITE_OTHER): Payer: Medicare Other | Admitting: Cardiology

## 2013-02-28 ENCOUNTER — Encounter: Payer: Self-pay | Admitting: Cardiology

## 2013-02-28 ENCOUNTER — Encounter (HOSPITAL_COMMUNITY): Payer: Self-pay | Admitting: General Practice

## 2013-02-28 ENCOUNTER — Inpatient Hospital Stay (HOSPITAL_COMMUNITY)
Admission: AD | Admit: 2013-02-28 | Discharge: 2013-03-03 | DRG: 308 | Disposition: A | Discharge status: Home / Self Care | Payer: Medicare Other | Source: Ambulatory Visit | Attending: Cardiology | Admitting: Cardiology

## 2013-02-28 VITALS — BP 122/60 | HR 84 | Ht 62.0 in | Wt 153.2 lb

## 2013-02-28 DIAGNOSIS — I495 Sick sinus syndrome: Secondary | ICD-10-CM

## 2013-02-28 DIAGNOSIS — E236 Other disorders of pituitary gland: Secondary | ICD-10-CM | POA: Diagnosis not present

## 2013-02-28 DIAGNOSIS — G40909 Epilepsy, unspecified, not intractable, without status epilepticus: Secondary | ICD-10-CM | POA: Diagnosis present

## 2013-02-28 DIAGNOSIS — K219 Gastro-esophageal reflux disease without esophagitis: Secondary | ICD-10-CM | POA: Diagnosis present

## 2013-02-28 DIAGNOSIS — I5032 Chronic diastolic (congestive) heart failure: Secondary | ICD-10-CM

## 2013-02-28 DIAGNOSIS — I5033 Acute on chronic diastolic (congestive) heart failure: Secondary | ICD-10-CM | POA: Diagnosis not present

## 2013-02-28 DIAGNOSIS — Z96659 Presence of unspecified artificial knee joint: Secondary | ICD-10-CM

## 2013-02-28 DIAGNOSIS — I10 Essential (primary) hypertension: Secondary | ICD-10-CM | POA: Diagnosis present

## 2013-02-28 DIAGNOSIS — M1990 Unspecified osteoarthritis, unspecified site: Secondary | ICD-10-CM | POA: Diagnosis present

## 2013-02-28 DIAGNOSIS — I701 Atherosclerosis of renal artery: Secondary | ICD-10-CM | POA: Diagnosis present

## 2013-02-28 DIAGNOSIS — I2789 Other specified pulmonary heart diseases: Secondary | ICD-10-CM | POA: Diagnosis present

## 2013-02-28 DIAGNOSIS — Z7901 Long term (current) use of anticoagulants: Secondary | ICD-10-CM | POA: Diagnosis not present

## 2013-02-28 DIAGNOSIS — I509 Heart failure, unspecified: Secondary | ICD-10-CM | POA: Diagnosis present

## 2013-02-28 DIAGNOSIS — J449 Chronic obstructive pulmonary disease, unspecified: Secondary | ICD-10-CM | POA: Diagnosis not present

## 2013-02-28 DIAGNOSIS — J90 Pleural effusion, not elsewhere classified: Secondary | ICD-10-CM | POA: Diagnosis not present

## 2013-02-28 DIAGNOSIS — Z950 Presence of cardiac pacemaker: Secondary | ICD-10-CM

## 2013-02-28 DIAGNOSIS — E871 Hypo-osmolality and hyponatremia: Secondary | ICD-10-CM | POA: Diagnosis present

## 2013-02-28 DIAGNOSIS — I4891 Unspecified atrial fibrillation: Secondary | ICD-10-CM | POA: Diagnosis present

## 2013-02-28 HISTORY — DX: Presence of cardiac pacemaker: Z95.0

## 2013-02-28 LAB — MAGNESIUM: Magnesium: 2.2 mg/dL (ref 1.5–2.5)

## 2013-02-28 LAB — CBC
HCT: 35.6 % — ABNORMAL LOW (ref 36.0–46.0)
Hemoglobin: 12.1 g/dL (ref 12.0–15.0)
MCHC: 34 g/dL (ref 30.0–36.0)
RBC: 4.22 MIL/uL (ref 3.87–5.11)
WBC: 4.4 10*3/uL (ref 4.0–10.5)

## 2013-02-28 LAB — BASIC METABOLIC PANEL
CO2: 28 mEq/L (ref 19–32)
Chloride: 91 mEq/L — ABNORMAL LOW (ref 96–112)
Creatinine, Ser: 1 mg/dL (ref 0.50–1.10)
Potassium: 5.2 mEq/L — ABNORMAL HIGH (ref 3.5–5.1)

## 2013-02-28 LAB — MRSA PCR SCREENING: MRSA by PCR: NEGATIVE

## 2013-02-28 MED ORDER — HYDROCODONE-ACETAMINOPHEN 5-325 MG PO TABS: 1.0000 | ORAL_TABLET | Freq: Four times a day (QID) | ORAL | Status: DC | PRN

## 2013-02-28 MED ORDER — TRAVOPROST 0.004 % OP SOLN: 1.0000 [drp] | Freq: Every day | OPHTHALMIC | Status: DC

## 2013-02-28 MED ORDER — TIMOLOL HEMIHYDRATE 0.5 % OP SOLN: 1.0000 [drp] | Freq: Two times a day (BID) | OPHTHALMIC | Status: DC

## 2013-02-28 MED ORDER — IRBESARTAN 300 MG PO TABS
300.0000 mg | ORAL_TABLET | Freq: Every day | ORAL | Status: DC
  Administered 2013-03-01 – 2013-03-03 (×3): 300 mg via ORAL
  Filled 2013-02-28 (×3): qty 1

## 2013-02-28 MED ORDER — DABIGATRAN ETEXILATE MESYLATE 150 MG PO CAPS
150.0000 mg | ORAL_CAPSULE | Freq: Two times a day (BID) | ORAL | Status: DC
  Administered 2013-02-28 – 2013-03-03 (×6): 150 mg via ORAL
  Filled 2013-02-28 (×7): qty 1

## 2013-02-28 MED ORDER — FESOTERODINE FUMARATE ER 8 MG PO TB24
8.0000 mg | ORAL_TABLET | Freq: Every day | ORAL | Status: DC
  Administered 2013-03-01 – 2013-03-03 (×3): 8 mg via ORAL
  Filled 2013-02-28 (×3): qty 1

## 2013-02-28 MED ORDER — METOPROLOL TARTRATE 25 MG PO TABS
25.0000 mg | ORAL_TABLET | Freq: Two times a day (BID) | ORAL | Status: DC
  Administered 2013-02-28 – 2013-03-01 (×3): 25 mg via ORAL
  Filled 2013-02-28 (×5): qty 1

## 2013-02-28 MED ORDER — ONDANSETRON HCL 4 MG/2ML IJ SOLN: 4.0000 mg | Freq: Four times a day (QID) | INTRAMUSCULAR | Status: DC | PRN

## 2013-02-28 MED ORDER — ADULT MULTIVITAMIN W/MINERALS CH
1.0000 | ORAL_TABLET | Freq: Every day | ORAL | Status: DC
  Administered 2013-03-01 – 2013-03-03 (×3): 1 via ORAL
  Filled 2013-02-28 (×3): qty 1

## 2013-02-28 MED ORDER — FUROSEMIDE 40 MG PO TABS
40.0000 mg | ORAL_TABLET | Freq: Every day | ORAL | Status: DC
  Administered 2013-03-01 – 2013-03-02 (×2): 40 mg via ORAL
  Filled 2013-02-28 (×4): qty 1

## 2013-02-28 MED ORDER — ACETAMINOPHEN 325 MG PO TABS: 650.0000 mg | ORAL_TABLET | ORAL | Status: DC | PRN

## 2013-02-28 MED ORDER — DIVALPROEX SODIUM 500 MG PO DR TAB
500.0000 mg | DELAYED_RELEASE_TABLET | Freq: Every day | ORAL | Status: DC
  Administered 2013-02-28 – 2013-03-02 (×3): 500 mg via ORAL
  Filled 2013-02-28 (×4): qty 1

## 2013-02-28 MED ORDER — TIMOLOL MALEATE 0.5 % OP SOLN
1.0000 [drp] | Freq: Two times a day (BID) | OPHTHALMIC | Status: DC
  Administered 2013-02-28 – 2013-03-03 (×6): 1 [drp] via OPHTHALMIC
  Filled 2013-02-28: qty 5

## 2013-02-28 MED ORDER — PANTOPRAZOLE SODIUM 40 MG PO TBEC
40.0000 mg | DELAYED_RELEASE_TABLET | Freq: Every day | ORAL | Status: DC
  Administered 2013-03-01 – 2013-03-03 (×3): 40 mg via ORAL
  Filled 2013-02-28: qty 1

## 2013-02-28 MED ORDER — TRAVOPROST (BAK FREE) 0.004 % OP SOLN
1.0000 [drp] | Freq: Every day | OPHTHALMIC | Status: DC
  Administered 2013-02-28 – 2013-03-02 (×3): 1 [drp] via OPHTHALMIC
  Filled 2013-02-28: qty 2.5

## 2013-02-28 MED ORDER — VITAMIN D3 25 MCG (1000 UNIT) PO TABS
1000.0000 [IU] | ORAL_TABLET | Freq: Every day | ORAL | Status: DC
  Administered 2013-03-01 – 2013-03-03 (×3): 1000 [IU] via ORAL
  Filled 2013-02-28 (×3): qty 1

## 2013-02-28 MED ORDER — AMLODIPINE BESYLATE 5 MG PO TABS
5.0000 mg | ORAL_TABLET | Freq: Every day | ORAL | Status: DC
  Administered 2013-03-01 – 2013-03-03 (×3): 5 mg via ORAL
  Filled 2013-02-28 (×3): qty 1

## 2013-02-28 MED ORDER — THERA M PLUS PO TABS: 1.0000 | ORAL_TABLET | Freq: Every day | ORAL | Status: DC

## 2013-02-28 NOTE — Telephone Encounter (Signed)
New problem  Pt's husband says he thinks her heart is out of rhythm again. He said she can barely walk and is weak. He asked if you could call him back.

## 2013-02-28 NOTE — H&P (Signed)
See below attached H&P by Dr. Swaziland. Pt admitted from office today. Jacqueline Dunn PA-C ------     [name]  Date of Birth: 07/12/1925  Medical Record #[id number]   History of Present Illness:  [name] is seen today as a work in visit. She has CHF, tachy brady with atrial fib, is on anticoagulation with Pradaxa. She is 87. She has a pacemaker in place. She was cardioverted successfully in October of 2012 and again in Jan. 2014. Last echo in 2012 showed a normal EF. She had a prior normal Lexiscan study in [place] in 2013. She was previously on amiodarone in 2012. She had an extensive hospitalization in December 2012 with respiratory failure that was felt to be multifactorial including diastolic heart failure, aspiration pneumonitis, and some interstitial lung disease. Amiodarone was discontinued. She has been managed with rate control alone. On presentation today she describes a four-day history of feeling much worse. She has no energy and is markedly fatigued. She complains of increased shortness of breath without orthopnea or PND. She denies any chest pain. She can tell that her heart is not beating right. On evaluation she is found to be in atrial fibrillation with controlled ventricular response of 84 beats per minute. It is felt that her symptoms are directly related to her arrhythmia.   Current Outpatient Prescriptions on File Prior to Visit   Medication  Sig  Dispense  Refill   .  amLODipine (NORVASC) 5 MG tablet  Take 1 tablet (5 mg total) by mouth daily.  90 tablet  4   .  Calcium Citrate (CITRACAL PO)  Take 2 tablets by mouth daily.     .  cholecalciferol (VITAMIN D) 1000 UNITS tablet  Take 1,000 Units by mouth daily.     .  dabigatran (PRADAXA) 150 MG CAPS  Take 1 capsule (150 mg total) by mouth every 12 (twelve) hours.  180 capsule  1   .  divalproex (DEPAKOTE) 500 MG EC tablet  Take 500 mg by mouth at bedtime.     .  furosemide (LASIX) 40 MG tablet  Take 1 tablet (40 mg  total) by mouth daily.  90 tablet  4   .  HYDROcodone-acetaminophen (VICODIN) 5-500 MG per tablet  Take 1 tablet by mouth every 6 (six) hours as needed. For pain     .  metoprolol tartrate (LOPRESSOR) 25 MG tablet  Take 1 tablet (25 mg total) by mouth 2 (two) times daily.  180 tablet  4   .  Multiple Vitamins-Minerals (MULTIVITAMINS THER. W/MINERALS) TABS  Take 1 tablet by mouth daily.     Marland Kitchen  olmesartan (BENICAR) 40 MG tablet  Take 1 tablet (40 mg total) by mouth daily.  90 tablet  4   .  pantoprazole (PROTONIX) 40 MG tablet  Take 40 mg by mouth daily.     .  polyethylene glycol powder (GLYCOLAX/MIRALAX) powder  Take 8 g by mouth daily as needed. For constipation     .  potassium chloride (K-DUR,KLOR-CON) 10 MEQ tablet  Take 1 tablet (10 mEq total) by mouth 2 (two) times daily.  180 tablet  4   .  timolol (BETIMOL) 0.5 % ophthalmic solution  Place 1 drop into both eyes 2 (two) times daily.     Marland Kitchen  tolterodine (DETROL LA) 4 MG 24 hr capsule  Take 4 mg by mouth daily.     .  travoprost, benzalkonium, (TRAVATAN) 0.004 % ophthalmic solution  Place  1 drop into the left eye at bedtime.      No current facility-administered medications on file prior to visit.    No Known Allergies  Past Medical History   Diagnosis  Date   .  Pneumonia  04/2010   .  Hypertension    .  Renal artery stenosis  2008     STATUS POST ANGIOPLASTY   .  GERD (gastroesophageal reflux disease)    .  Back pain      CHRONIC   .  Atrial fibrillation      Prior TEE/CV in May 2012; managed with rate control and anticoagulation   .  Hyponatremia      improved   .  Seizure disorder      on anti-epileptic therapy   .  Urinary incontinence    .  Osteopenia    .  SIADH (syndrome of inappropriate ADH production)      h/o HYPONATREMIA ; LIKELY RELATED TO HER LUNG PROCESS   .  Arthritis    .  Hiatal hernia    .  Tachy-brady syndrome      with pauses noted in May 2012; s/p PTVP October 2012   .  Dysphagia     Past Surgical  History   Procedure  Laterality  Date   .  Cholecystectomy     .  Back surgery       X2   .  Total knee arthroplasty       RIGHT AND LEFT KNEE   .  Tonsillectomy     .  Appendectomy     .  Cardiac catheterization   2007     Normal   .  Knee arthroscopy       LEFT KNEE   .  Cataract extraction     .  Shoulder surgery     .  Renal angioplasty   01/2007     RIGHT   .  Partial hysterectomy     .  Pacemaker insertion   06/2011     SJM Accent DR RF implanted by Dr [name]   .  Eye surgery       cataracts removed on both eyes   .  Cardioversion     .  Cardioversion   10/14/2012     Procedure: CARDIOVERSION; Surgeon: Jacqueline Mixer, MD; Location[facility] END[facility]COPY; Service: Cardiovascular; Laterality: N/A;    History   Smoking status   .  Never Smoker   Smokeless tobacco   .  Never Used    History   Alcohol Use  No    Family History   Problem  Relation  Age of Onset   .  Stroke  Mother    .  Diabetes  Mother    .  Heart disease  Mother      Unkown   .  Stroke  Father    .  Diabetes  Father    .  Cancer  Father      PROSTATE   .  Cancer  Brother      RENAL AND BLADDER   .  Lung disease  Brother    .  Lung disease  Sister    .  Hypertension  Daughter    .  Hypertension  Son     Review of Systems:  The review of systems is per the HPI. All other systems were reviewed and are negative.   Physical Exam:  BP 122/60  Pulse 84  Ht 5\' 2"  (1.575 m)  Wt 153 lb 3.2 oz (69.491 kg)  BMI 28.01 kg/m2  Patient is an elderly female who looks chronically ill. She is extremely short of breath with any exertion. She is pale. Skin is warm and dry. HEENT is unremarkable. Normocephalic/atraumatic. PERRL. Sclera are nonicteric. Neck is supple. No masses. JVD to 10 cm. Lungs reveal few crackles. Cardiac exam shows an irregular rate and rhythm. Normal S1-S2. No gallop or murmur. Abdomen is soft. Extremities are without edema. Gait and ROM are intact. No gross neurologic deficits noted.    LABORATORY DATA: EKG today shows underlying atrial fib, rate of 84 with pacing noted. QTC is 418 ms.   Assessment / Plan:  1. Recurrent atrial fib - on Pradaxa - rate is controlled but patient is significantly symptomatic. There is a question of pulmonary toxicity with prior amiodarone. I think it is best that we avoid this medication. I recommended admission to the hospital for telemetry monitoring. We will check baseline lab work including chemistries, CBC, TSH, and chest x-ray. We'll check magnesium level. Her QTC is normal at 418 ms. I think she would be a good candidate for Tikosyn. We will plan on letting Tikosyn over the weekend and schedule her for elective cardioversion on Monday.  2. Status post pacemaker for tachybradycardia syndrome.  3. HTN  4. Advanced age  75. Chronic anticoagulation - she has remained on her Pradaxa and has not missed any doses.  6. Acute on chronic diastolic heart failure. Exacerbated by atrial fibrillation.  Depending on her initial evaluation with chest x-ray and labs she may need more diuresis. I recommend updating her echocardiogram since her last evaluation was in 2012.   Naida Escalante Swaziland MD, [facility]  02/28/2013  1:50 PM

## 2013-02-28 NOTE — Progress Notes (Addendum)
QTc manually calculated from today's EKG, prolonged at average of . (Her paced beats throw off the pre-calculated QTc.) Reviewed with EP as well who felt range was 470-514. Tikosyn is contraindicated for QTc 440. D/w Dr. Swaziland. Will not initiate Tikosyn at this time. We discussed different treatment options as well as CXR findings. Our current plan is to obtain 2D echocardiogram, and if no evidence of LV dysfunction, consider initiation of Multaq tomorrow. CXR negative for heart failure or pneumonia, but does show COPD and scarring. Will otherwise continue home meds this evening. Na is on low side at 127, K is 5.2. Sh has h/o hyponatremia felt due to SIADH. Most recent Na was 136 however in 12/2012 by [facility] labs (previously 126-133). May need to consider pulm eval if we think hyponatremia is from her lung process as previously mentioned. Hold KCl and repeat BMET in AM. If hyperkalemia persists, will need to adjust ACEI as well. Pt and nurse updated of plan. Ramari Bray PA-C  Note: the patient did not take her AM lasix today because she had planned on seeing the doctor and didn't want urination to interfere with the car ride/appointment. Will give her today's dose now. Rosevelt Luu PA-C

## 2013-02-28 NOTE — Telephone Encounter (Signed)
Will forward to Cheryl P LPN with Dr Jordan  

## 2013-02-28 NOTE — Telephone Encounter (Signed)
Returned call to patient's husband he stated wife passed out this past Tuesday.Stated she is very weak,sob.Stated home monitor says her heart is out of rhythm.Appointment scheduled with Dr.Jordan today at 1:15 pm.

## 2013-02-28 NOTE — Progress Notes (Signed)
[name] Date of Birth: 1925/03/29 Medical Record #[id number]  History of Present Illness: [name] is seen today as a work in visit.  She has CHF, tachy brady with atrial fib, is on anticoagulation with Pradaxa. She is 79. She has a pacemaker in place.  She was cardioverted successfully in October of 2012 and again in Jan. 2014.  Last echo in 2012 showed a normal EF. She had a prior normal Lexiscan study in [place] in 2013. She was previously on amiodarone in 2012. She had an extensive hospitalization in December 2012 with respiratory failure that was felt to be multifactorial including diastolic heart failure, aspiration pneumonitis, and some interstitial lung disease. Amiodarone was discontinued. She has been managed with rate control alone. On presentation today she describes a four-day history of feeling much worse. She has no energy and is markedly fatigued. She complains of increased shortness of breath without orthopnea or PND. She denies any chest pain. She can tell that her heart is not beating right. On evaluation she is found to be in atrial fibrillation with controlled ventricular response of 84 beats per minute. It is felt that her symptoms are directly related to her arrhythmia.   Current Outpatient Prescriptions on File Prior to Visit  Medication Sig Dispense Refill  . amLODipine (NORVASC) 5 MG tablet Take 1 tablet (5 mg total) by mouth daily.  90 tablet  4  . Calcium Citrate (CITRACAL PO) Take 2 tablets by mouth daily.       . cholecalciferol (VITAMIN D) 1000 UNITS tablet Take 1,000 Units by mouth daily.        . dabigatran (PRADAXA) 150 MG CAPS Take 1 capsule (150 mg total) by mouth every 12 (twelve) hours.  180 capsule  1  . divalproex (DEPAKOTE) 500 MG EC tablet Take 500 mg by mouth at bedtime.        . furosemide (LASIX) 40 MG tablet Take 1 tablet (40 mg total) by mouth daily.  90 tablet  4  . HYDROcodone-acetaminophen (VICODIN) 5-500 MG per tablet Take 1 tablet by  mouth every 6 (six) hours as needed. For pain       . metoprolol tartrate (LOPRESSOR) 25 MG tablet Take 1 tablet (25 mg total) by mouth 2 (two) times daily.  180 tablet  4  . Multiple Vitamins-Minerals (MULTIVITAMINS THER. W/MINERALS) TABS Take 1 tablet by mouth daily.        Marland Kitchen olmesartan (BENICAR) 40 MG tablet Take 1 tablet (40 mg total) by mouth daily.  90 tablet  4  . pantoprazole (PROTONIX) 40 MG tablet Take 40 mg by mouth daily.      . polyethylene glycol powder (GLYCOLAX/MIRALAX) powder Take 8 g by mouth daily as needed. For constipation      . potassium chloride (K-DUR,KLOR-CON) 10 MEQ tablet Take 1 tablet (10 mEq total) by mouth 2 (two) times daily.  180 tablet  4  . timolol (BETIMOL) 0.5 % ophthalmic solution Place 1 drop into both eyes 2 (two) times daily.       Marland Kitchen tolterodine (DETROL LA) 4 MG 24 hr capsule Take 4 mg by mouth daily.      . travoprost, benzalkonium, (TRAVATAN) 0.004 % ophthalmic solution Place 1 drop into the left eye at bedtime.        No current facility-administered medications on file prior to visit.    No Known Allergies  Past Medical History  Diagnosis Date  . Pneumonia 04/2010  . Hypertension   .  Renal artery stenosis 2008    STATUS POST ANGIOPLASTY  . GERD (gastroesophageal reflux disease)   . Back pain     CHRONIC  . Atrial fibrillation     Prior TEE/CV in May 2012; managed with rate control and anticoagulation  . Hyponatremia     improved  . Seizure disorder     on anti-epileptic therapy  . Urinary incontinence   . Osteopenia   . SIADH (syndrome of inappropriate ADH production)     h/o HYPONATREMIA ; LIKELY RELATED TO HER LUNG PROCESS  . Arthritis   . Hiatal hernia   . Tachy-brady syndrome     with pauses noted in May 2012; s/p PTVP October 2012  . Dysphagia     Past Surgical History  Procedure Laterality Date  . Cholecystectomy    . Back surgery      X2  . Total knee arthroplasty      RIGHT AND LEFT KNEE  . Tonsillectomy    .  Appendectomy    . Cardiac catheterization  2007    Normal  . Knee arthroscopy      LEFT KNEE  . Cataract extraction    . Shoulder surgery    . Renal angioplasty  01/2007    RIGHT  . Partial hysterectomy    . Pacemaker insertion  06/2011    SJM [employer] DR RF implanted by Dr Johney Frame  . Eye surgery      cataracts removed on both eyes  . Cardioversion    . Cardioversion  10/14/2012    Procedure: CARDIOVERSION;  Surgeon: Vesta Mixer, MD;  Location: [facility];  Service: Cardiovascular;  Laterality: N/A;    History  Smoking status  . Never Smoker   Smokeless tobacco  . Never Used    History  Alcohol Use No    Family History  Problem Relation Age of Onset  . Stroke Mother   . Diabetes Mother   . Heart disease Mother     Unkown  . Stroke Father   . Diabetes Father   . Cancer Father     PROSTATE  . Cancer Brother     RENAL AND BLADDER  . Lung disease Brother   . Lung disease Sister   . Hypertension Daughter   . Hypertension Son     Review of Systems: The review of systems is per the HPI.  All other systems were reviewed and are negative.  Physical Exam: BP 122/60  Pulse 84  Ht 5\' 2"  (1.575 m)  Wt 153 lb 3.2 oz (69.491 kg)  BMI 28.01 kg/m2 Patient is an elderly female who looks chronically ill. She is extremely short of breath with any exertion. She is pale. Skin is warm and dry.  HEENT is unremarkable. Normocephalic/atraumatic. PERRL. Sclera are nonicteric. Neck is supple. No masses. JVD to 10 cm. Lungs reveal few crackles. Cardiac exam shows an irregular rate and rhythm. Normal S1-S2. No gallop or murmur. Abdomen is soft. Extremities are without edema. Gait and ROM are intact. No gross neurologic deficits noted.  LABORATORY DATA: EKG today shows underlying atrial fib, rate of 84 with pacing noted. QTC is 418 ms.   Assessment / Plan: 1. Recurrent atrial fib - on Pradaxa - rate is controlled but patient is significantly symptomatic. There is a question of  pulmonary toxicity with prior amiodarone. I think it is best that we avoid this medication. I recommended admission to the hospital for telemetry monitoring. We will check baseline lab work  including chemistries, CBC, TSH, and chest x-ray. We'll check magnesium level. Her QTC is normal at 418 ms. I think she would be a good candidate for Tikosyn. We will plan on letting Tikosyn over the weekend and schedule her for elective cardioversion on Monday.  2. Status post pacemaker for tachybradycardia syndrome.  3. HTN  4. Advanced age  76. Chronic anticoagulation - she has remained on her Pradaxa and has not missed any doses.   6. Acute on chronic diastolic heart failure. Exacerbated by atrial fibrillation. Depending on her initial evaluation with chest x-ray and labs she may need more diuresis. I recommend updating her echocardiogram since her last evaluation was in 2012.  Aleea Hendry [place] MD, [facility]  02/28/2013 1:50 PM

## 2013-03-01 LAB — BASIC METABOLIC PANEL
Calcium: 9.1 mg/dL (ref 8.4–10.5)
Creatinine, Ser: 0.95 mg/dL (ref 0.50–1.10)
GFR calc Af Amer: 62 mL/min — ABNORMAL LOW (ref >90)
GFR calc non Af Amer: 53 mL/min — ABNORMAL LOW (ref >90)

## 2013-03-01 LAB — CBC
MCH: 28.6 pg (ref 26.0–34.0)
MCHC: 33.8 g/dL (ref 30.0–36.0)
MCV: 84.7 fL (ref 78.0–100.0)
Platelets: 172 10*3/uL (ref 150–400)
RDW: 15.9 % — ABNORMAL HIGH (ref 11.5–15.5)
WBC: 5.2 10*3/uL (ref 4.0–10.5)

## 2013-03-01 NOTE — Progress Notes (Signed)
Patient Name: [name]     SUBJECTIVE:   With hix of recurrent Afib and question of amio toxicity admitted 6.13 for tikosyn loading;felt to have too long QTc;  Change in plan was to use dronaderone if echo ok  has been on dabigitran  Lexiscan 2013 normal Echo 2012 Nl LVEF  S/p pcer with hx of HFpEF Past Medical History  Diagnosis Date  . Pneumonia 04/2010  . Hypertension   . Renal artery stenosis 2008    STATUS POST ANGIOPLASTY  . GERD (gastroesophageal reflux disease)   . Back pain     CHRONIC  . Atrial fibrillation     Prior TEE/CV in May 2012; managed with rate control and anticoagulation  . Hyponatremia     improved  . Seizure disorder     on anti-epileptic therapy  . Urinary incontinence   . Osteopenia   . SIADH (syndrome of inappropriate ADH production)     h/o HYPONATREMIA ; LIKELY RELATED TO HER LUNG PROCESS  . Arthritis   . Hiatal hernia   . Tachy-brady syndrome     with pauses noted in May 2012; s/p PTVP October 2012  . Dysphagia   . CHF (congestive heart failure)   . Pacemaker     PHYSICAL EXAM Filed Vitals:   02/28/13 1647 02/28/13 2100 03/01/13 0500 03/01/13 0940  BP:  146/82 137/88 150/87  Pulse:  66 64   Temp:  97.7 F (36.5 C) 97.9 F (36.6 C)   TempSrc:      Resp:  16 18   Height: 5\' 2"  (1.575 m)     Weight: 153 lb 6.4 oz (69.582 kg)  153 lb 9.6 oz (69.673 kg)   SpO2:  98% 97%     Well developed and nourished in no acute distress HENT normal Neck supple with JVP-9-10 Carotids brisk and full without bruits Clear Irregularly irregular rate and rhythm with controlled ventricular response, no murmurs or gallops Abd-soft with active BS without hepatomegaly No Clubbing cyanosis edema Skin-warm and dry A & Oriented  Grossly normal sensory and motor function  TELEMETRY: Reviewed telemetry pt in  afib awith intermittent Vpacing:    Intake/Output Summary (Last 24 hours) at 03/01/13 0941 Last data filed at 03/01/13 0500  Gross per 24  hour  Intake    240 ml  Output      0 ml  Net    240 ml    LABS: Basic Metabolic Panel:  Recent Labs Lab 02/28/13 1550 03/01/13 0425  NA 127* 127*  K 5.2* 4.2  CL 91* 90*  CO2 28 27  GLUCOSE 96 89  BUN 15 13  CREATININE 1.00 0.95  CALCIUM 9.5 9.1  MG 2.2  --    Cardiac Enzymes: No results found for this basename: CKTOTAL, CKMB, CKMBINDEX, TROPONINI,  in the last 72 hours CBC:  Recent Labs Lab 02/28/13 1550 03/01/13 0425  WBC 4.4 5.2  HGB 12.1 12.0  HCT 35.6* 35.5*  MCV 84.4 84.7  PLT 169 172     Recent Labs  02/28/13 1550  TSH 1.350   Anemia Panel: No results found for this basename: VITAMINB12, FOLATE, FERRITIN, TIBC, IRON, RETICCTPCT,  in the last 72 hours   Device Interrogation    ASSESSMENT AND PLAN:  Active Problems:   Atrial fibrillation   Tachycardia-bradycardia   Diastolic CHF, chronic   Hyponatremia   Pacemaker-[employer]  Await echo and will plan dronaderone wth DCCV on mon Continue diurtics  Signed, Sherryl Manges  MD  03/01/2013

## 2013-03-01 NOTE — Progress Notes (Signed)
UR Completed Arsen Mangione Graves-Bigelow, RN,BSN [phone]  

## 2013-03-02 LAB — BASIC METABOLIC PANEL
CO2: 33 mEq/L — ABNORMAL HIGH (ref 19–32)
Calcium: 9.6 mg/dL (ref 8.4–10.5)
Creatinine, Ser: 0.91 mg/dL (ref 0.50–1.10)

## 2013-03-02 LAB — PROTIME-INR: Prothrombin Time: 19.3 seconds — ABNORMAL HIGH (ref 11.6–15.2)

## 2013-03-02 MED ORDER — SODIUM CHLORIDE 0.9 % IJ SOLN
3.0000 mL | Freq: Two times a day (BID) | INTRAMUSCULAR | Status: DC
  Administered 2013-03-02 (×3): 3 mL via INTRAVENOUS

## 2013-03-02 MED ORDER — SODIUM CHLORIDE 0.9 % IJ SOLN: 3.0000 mL | INTRAMUSCULAR | Status: DC | PRN

## 2013-03-02 MED ORDER — DRONEDARONE HCL 400 MG PO TABS
400.0000 mg | ORAL_TABLET | Freq: Two times a day (BID) | ORAL | Status: DC
  Administered 2013-03-02 – 2013-03-03 (×2): 400 mg via ORAL
  Filled 2013-03-02 (×4): qty 1

## 2013-03-02 MED ORDER — SODIUM CHLORIDE 0.9 % IV SOLN: 250.0000 mL | INTRAVENOUS | Status: DC

## 2013-03-02 MED ORDER — SODIUM CHLORIDE 0.9 % IV SOLN: INTRAVENOUS | Status: DC

## 2013-03-02 NOTE — Progress Notes (Signed)
*  PRELIMINARY RESULTS* Echocardiogram 2D Echocardiogram has been performed.  Jeryl Columbia 03/02/2013, 12:57 PM

## 2013-03-02 NOTE — Progress Notes (Signed)
Patient Name: [name]     SUBJECTIVE:   With hix of recurrent Afib and question of amio toxicity admitted 6.13 for tikosyn loading;felt to have too long QTc;  Change in plan was to use dronaderone if echo ok  has been on dabigitran  Echo still pending  Lexiscan 2013 normal Echo 2012 Nl LVEF  S/p pcer with hx of HFpEF Past Medical History  Diagnosis Date  . Pneumonia 04/2010  . Hypertension   . Renal artery stenosis 2008    STATUS POST ANGIOPLASTY  . GERD (gastroesophageal reflux disease)   . Back pain     CHRONIC  . Atrial fibrillation     Prior TEE/CV in May 2012; managed with rate control and anticoagulation  . Hyponatremia     improved  . Seizure disorder     on anti-epileptic therapy  . Urinary incontinence   . Osteopenia   . SIADH (syndrome of inappropriate ADH production)     h/o HYPONATREMIA ; LIKELY RELATED TO HER LUNG PROCESS  . Arthritis   . Hiatal hernia   . Tachy-brady syndrome     with pauses noted in May 2012; s/p PTVP October 2012  . Dysphagia   . CHF (congestive heart failure)   . Pacemaker     PHYSICAL EXAM Filed Vitals:   03/01/13 0942 03/01/13 1400 03/01/13 2100 03/02/13 0500  BP:  114/62 137/85 112/50  Pulse: 64 66 60 62  Temp:  97.4 F (36.3 C) 98.1 F (36.7 C) 97.6 F (36.4 C)  TempSrc:  Oral    Resp:  18 20 18   Height:      Weight:    149 lb (67.586 kg)  SpO2:  97% 99% 97%    Well developed and nourished in no acute distress HENT normal Neck supple with JVP-9-10 Carotids brisk and full without bruits Clear Irregularly irregular rate and rhythm with controlled ventricular response, no murmurs or gallops Abd-soft with active BS without hepatomegaly No Clubbing cyanosis edema Skin-warm and dry A & Oriented  Grossly normal sensory and motor function  TELEMETRY: Reviewed telemetry pt in  afib awith intermittent Vpacing:    Intake/Output Summary (Last 24 hours) at 03/02/13 0833 Last data filed at 03/02/13 0500  Gross  per 24 hour  Intake    840 ml  Output    550 ml  Net    290 ml    LABS: Basic Metabolic Panel:  Recent Labs Lab 02/28/13 1550 03/01/13 0425  NA 127* 127*  K 5.2* 4.2  CL 91* 90*  CO2 28 27  GLUCOSE 96 89  BUN 15 13  CREATININE 1.00 0.95  CALCIUM 9.5 9.1  MG 2.2  --    Cardiac Enzymes: No results found for this basename: CKTOTAL, CKMB, CKMBINDEX, TROPONINI,  in the last 72 hours CBC:  Recent Labs Lab 02/28/13 1550 03/01/13 0425  WBC 4.4 5.2  HGB 12.1 12.0  HCT 35.6* 35.5*  MCV 84.4 84.7  PLT 169 172     Recent Labs  02/28/13 1550  TSH 1.350   Anemia Panel: No results found for this basename: VITAMINB12, FOLATE, FERRITIN, TIBC, IRON, RETICCTPCT,  in the last 72 hours   Device Interrogation    ASSESSMENT AND PLAN:  Active Problems:   Atrial fibrillation   Tachycardia-bradycardia   Diastolic CHF, chronic   Hyponatremia   Pacemaker-[employer]  Await echo but will start    dronaderone and plan DCCV on mon--presuming ef is stillnormal Continue diuretics Discontinue lopressor  as relatively slow HR already  Signed, Sherryl Manges MD  03/02/2013

## 2013-03-03 ENCOUNTER — Encounter (HOSPITAL_COMMUNITY)
Admission: AD | Disposition: A | Discharge status: Home / Self Care | Payer: Self-pay | Source: Ambulatory Visit | Attending: Cardiology

## 2013-03-03 DIAGNOSIS — I4891 Unspecified atrial fibrillation: Secondary | ICD-10-CM

## 2013-03-03 HISTORY — PX: CARDIOVERSION: SHX1299

## 2013-03-03 LAB — BASIC METABOLIC PANEL
CO2: 31 mEq/L (ref 19–32)
Calcium: 8.7 mg/dL (ref 8.4–10.5)
Creatinine, Ser: 1.04 mg/dL (ref 0.50–1.10)
Glucose, Bld: 92 mg/dL (ref 70–99)

## 2013-03-03 SURGERY — CARDIOVERSION
Anesthesia: LOCAL | Wound class: Clean

## 2013-03-03 MED ORDER — FENTANYL CITRATE 0.05 MG/ML IJ SOLN
INTRAMUSCULAR | Status: AC
  Filled 2013-03-03: qty 2

## 2013-03-03 MED ORDER — DABIGATRAN ETEXILATE MESYLATE 75 MG PO CAPS: 75.0000 mg | ORAL_CAPSULE | Freq: Two times a day (BID) | ORAL | Status: DC

## 2013-03-03 MED ORDER — MIDAZOLAM HCL 5 MG/5ML IJ SOLN
INTRAMUSCULAR | Status: AC
  Filled 2013-03-03: qty 5

## 2013-03-03 MED ORDER — DRONEDARONE HCL 400 MG PO TABS: 400.0000 mg | ORAL_TABLET | Freq: Two times a day (BID) | ORAL | Status: DC

## 2013-03-03 NOTE — CV Procedure (Signed)
Preop Dx  afib Post op DX  NSR   Procedure  DC Cardioversion   Pt was sedated by anesthesia receiving 3 mg versed 25 mg fentanyl     A synchronized shock 200  joules restored sinus Rhythm with AV pacing  Pt tolerated without difficulty   Pacer interrogated and reprogrammed

## 2013-03-03 NOTE — Progress Notes (Signed)
D/c orders received;IV removed with gauze on, pt remains in stable condition, pt meds and instructions reviewed and given to pt; pt d/c to home 

## 2013-03-03 NOTE — Interval H&P Note (Signed)
History and Physical Interval Note:  03/03/2013 11:08 AM  [name]  has presented today for surgery, with the diagnosis of a  The various methods of treatment have been discussed with the patient and family. After consideration of risks, benefits and other options for treatment, the patient has consented to  Procedure(s): CARDIOVERSION (N/A) as a surgical intervention .  The patient's history has been reviewed, patient examined, no change in status, stable for surgery.  I have reviewed the patient's chart and labs.  Questions were answered to the patient's satisfaction.     Sherryl Manges

## 2013-03-03 NOTE — Progress Notes (Signed)
TELEMETRY: Reviewed telemetry pt in atrial fibrillation with controlled ventricular response. Occ demand pacing.: Filed Vitals:   03/02/13 0500 03/02/13 1319 03/02/13 2037 03/03/13 0530  BP: 112/50 110/73 120/74 110/75  Pulse: 62 78 68 63  Temp: 97.6 F (36.4 C) 97.7 F (36.5 C) 97.6 F (36.4 C) 97.4 F (36.3 C)  TempSrc:  Oral Oral Oral  Resp: 18 18 18 18   Height:      Weight: 149 lb (67.586 kg)   152 lb 12.5 oz (69.3 kg)  SpO2: 97% 98% 97% 97%    Intake/Output Summary (Last 24 hours) at 03/03/13 0841 Last data filed at 03/03/13 0400  Gross per 24 hour  Intake   1080 ml  Output    100 ml  Net    980 ml    SUBJECTIVE Feels well. Still intermittent SOB. No chest pain. Weak.  LABS: Basic Metabolic Panel:  Recent Labs  16/10/96 1550  03/02/13 0950 03/03/13 0717  NA 127*  < > 127* 127*  K 5.2*  < > 3.9 3.8  CL 91*  < > 87* 87*  CO2 28  < > 33* 31  GLUCOSE 96  < > 94 92  BUN 15  < > 12 12  CREATININE 1.00  < > 0.91 1.04  CALCIUM 9.5  < > 9.6 8.7  MG 2.2  --   --   --   < > = values in this interval not displayed. CBC:  Recent Labs  02/28/13 1550 03/01/13 0425  WBC 4.4 5.2  HGB 12.1 12.0  HCT 35.6* 35.5*  MCV 84.4 84.7  PLT 169 172   Thyroid Function Tests:  Recent Labs  02/28/13 1550  TSH 1.350    Radiology/Studies:  Dg Chest 2 View  02/28/2013   *RADIOLOGY REPORT*  Clinical Data: Bradycardia.  CHF  CHEST - 2 VIEW  Comparison: 09/27/2011  Findings: COPD with pulmonary hyperinflation and pulmonary scarring.  Negative for pneumonia.  Negative for heart failure or edema.  Minimal left effusion.  Dual lead pacemaker is unchanged.  IMPRESSION: COPD and scarring.  Minimal left effusion.  Negative for heart failure or pneumonia.   Original Report Authenticated By: Janeece Riggers, M.D.   Echo: Study Conclusions  - Left ventricle: The cavity size was normal. Wall thickness was increased in a pattern of moderate LVH. Systolic function was normal. The  estimated ejection fraction was in the range of 55% to 60%. - Mitral valve: Mildly calcified annulus. Mild regurgitation. - Left atrium: The atrium was mildly to moderately dilated. - Right atrium: The atrium was dilated. - Tricuspid valve: Moderate regurgitation. - Pulmonary arteries: Systolic pressure was severely increased. PA peak pressure: 90mm Hg (S).  Ecg: afib with demand V pacing. Nonspecific ST-T changes.   PHYSICAL EXAM General: Well developed, elderly, in no acute distress. Head: Normal Neck: Negative for carotid bruits. JVD 10 cm. Lungs: bilateral dry crackles. Heart: IRRR S1 S2 without murmurs, rubs, or gallops.  Abdomen: Soft, non-tender, non-distended with normoactive bowel sounds. No hepatomegaly.  Extremities: No clubbing, cyanosis or edema.  Distal pedal pulses are 2+ and equal bilaterally. Neuro: Alert and oriented X 3. Moves all extremities spontaneously. Psych:  Responds to questions appropriately with a normal affect.  ASSESSMENT AND PLAN: 1. Atrial fibrillation. Recurrent and symptomatic. Echo shows normal LV function. Mild to moderate LAE. RAE. Severe pulmonary HTN. Poor candidate for amiodarone given concerns of pulmonary toxicity. QT too long for Tikosyn. Now on dronanderone. Plan to proceed with  DCCV today. Anticoagulation with Pradaxa.  2. Diastolic CHF.   3. Severe pulmonary HTN.  4. Tachybrady syndrome s/p pacemaker.   5. Hyponatremia.  Active Problems:   Atrial fibrillation   Tachycardia-bradycardia   Diastolic CHF, chronic   Hyponatremia   Pacemaker-[employer]    Signed, Jamyiah Labella [place] MD,FACC 03/03/2013 8:50 AM

## 2013-03-03 NOTE — H&P (View-Only) (Signed)
 TELEMETRY: Reviewed telemetry pt in atrial fibrillation with controlled ventricular response. Occ demand pacing.: Filed Vitals:   03/02/13 0500 03/02/13 1319 03/02/13 2037 03/03/13 0530  BP: 112/50 110/73 120/74 110/75  Pulse: 62 78 68 63  Temp: 97.6 F (36.4 C) 97.7 F (36.5 C) 97.6 F (36.4 C) 97.4 F (36.3 C)  TempSrc:  Oral Oral Oral  Resp: 18 18 18 18  Height:      Weight: 149 lb (67.586 kg)   152 lb 12.5 oz (69.3 kg)  SpO2: 97% 98% 97% 97%    Intake/Output Summary (Last 24 hours) at 03/03/13 0841 Last data filed at 03/03/13 0400  Gross per 24 hour  Intake   1080 ml  Output    100 ml  Net    980 ml    SUBJECTIVE Feels well. Still intermittent SOB. No chest pain. Weak.  LABS: Basic Metabolic Panel:  Recent Labs  02/28/13 1550  03/02/13 0950 03/03/13 0717  NA 127*  < > 127* 127*  K 5.2*  < > 3.9 3.8  CL 91*  < > 87* 87*  CO2 28  < > 33* 31  GLUCOSE 96  < > 94 92  BUN 15  < > 12 12  CREATININE 1.00  < > 0.91 1.04  CALCIUM 9.5  < > 9.6 8.7  MG 2.2  --   --   --   < > = values in this interval not displayed. CBC:  Recent Labs  02/28/13 1550 03/01/13 0425  WBC 4.4 5.2  HGB 12.1 12.0  HCT 35.6* 35.5*  MCV 84.4 84.7  PLT 169 172   Thyroid Function Tests:  Recent Labs  02/28/13 1550  TSH 1.350    Radiology/Studies:  Dg Chest 2 View  02/28/2013   *RADIOLOGY REPORT*  Clinical Data: Bradycardia.  CHF  CHEST - 2 VIEW  Comparison: 09/27/2011  Findings: COPD with pulmonary hyperinflation and pulmonary scarring.  Negative for pneumonia.  Negative for heart failure or edema.  Minimal left effusion.  Dual lead pacemaker is unchanged.  IMPRESSION: COPD and scarring.  Minimal left effusion.  Negative for heart failure or pneumonia.   Original Report Authenticated By: David Clark, M.D.   Echo: Study Conclusions  - Left ventricle: The cavity size was normal. Wall thickness was increased in a pattern of moderate LVH. Systolic function was normal. The  estimated ejection fraction was in the range of 55% to 60%. - Mitral valve: Mildly calcified annulus. Mild regurgitation. - Left atrium: The atrium was mildly to moderately dilated. - Right atrium: The atrium was dilated. - Tricuspid valve: Moderate regurgitation. - Pulmonary arteries: Systolic pressure was severely increased. PA peak pressure: 90mm Hg (S).  Ecg: afib with demand V pacing. Nonspecific ST-T changes.   PHYSICAL EXAM General: Well developed, elderly, in no acute distress. Head: Normal Neck: Negative for carotid bruits. JVD 10 cm. Lungs: bilateral dry crackles. Heart: IRRR S1 S2 without murmurs, rubs, or gallops.  Abdomen: Soft, non-tender, non-distended with normoactive bowel sounds. No hepatomegaly.  Extremities: No clubbing, cyanosis or edema.  Distal pedal pulses are 2+ and equal bilaterally. Neuro: Alert and oriented X 3. Moves all extremities spontaneously. Psych:  Responds to questions appropriately with a normal affect.  ASSESSMENT AND PLAN: 1. Atrial fibrillation. Recurrent and symptomatic. Echo shows normal LV function. Mild to moderate LAE. RAE. Severe pulmonary HTN. Poor candidate for amiodarone given concerns of pulmonary toxicity. QT too long for Tikosyn. Now on dronanderone. Plan to proceed with   DCCV today. Anticoagulation with Pradaxa.  2. Diastolic CHF.   3. Severe pulmonary HTN.  4. Tachybrady syndrome s/p pacemaker.   5. Hyponatremia.  Active Problems:   Atrial fibrillation   Tachycardia-bradycardia   Diastolic CHF, chronic   Hyponatremia   Pacemaker-[employer]    Signed, Peter Jordan MD,FACC 03/03/2013 8:50 AM    

## 2013-03-03 NOTE — Discharge Summary (Signed)
CARDIOLOGY DISCHARGE SUMMARY    Patient ID: [name],  MRN: [id number], DOB/AGE: 1925-05-30 79 y.o.  Admit date: 02/28/2013 Discharge date: 03/03/2013  Primary Care Physician: Buren Kos, MD Primary Cardiologist: Peter Swaziland, MD  Primary Discharge Diagnosis:  1. Symptomatic, recurrent AF s/p DCCV 2. Acute on chronic diastolic HF  Secondary Discharge Diagnoses:  1. Tachy-brady syndrome s/p PPM implant 2. HTN 3. Renal artery stenosis  4. Seizure disorder 5. SIADH with hyponatremia 6. Osteoarthritis 7. GERD  Procedures This Admission:  1. 2D echo 03/01/2013  Study Conclusions - Left ventricle: The cavity size was normal. Wall thickness was increased in a pattern of moderate LVH. Systolic function was normal. The estimated ejection fraction was in the range of 55% to 60%. - Mitral valve: Mildly calcified annulus. Mild regurgitation. - Left atrium: The atrium was mildly to moderately dilated. - Right atrium: The atrium was dilated. - Tricuspid valve: Moderate regurgitation. - Pulmonary arteries: Systolic pressure was severely increased. PA peak pressure: 90mm Hg (S). 2. DCCV 03/03/2013 Synchronized shock 200 joules restored sinus rhythm with AV pacing  Patient tolerated without difficulty  Pacer interrogated and reprogrammed   History: Per dictated H&P, [name] was seen 02/28/2013 by Dr. Swaziland as a work in visit. She has chronic diastolic HF, AF with tachy-brady s/p PPM and anticoagulated with Pradaxa. She was cardioverted successfully in October 2012 and again in Jan 2014. She had a prior normal Lexiscan study in [place] in 2013. She was previously on amiodarone in 2012. She had an extensive hospitalization in December 2012 with respiratory failure that was felt to be multifactorial including diastolic heart failure, aspiration pneumonia and interstitial lung disease. Amiodarone was discontinued. She has been managed with rate control alone. On presentation  to Dr. Swaziland she described a four-day history of feeling "much worse." She had no energy and was markedly fatigued. She reported increased shortness of breath without orthopnea or PND. She reported palpitations. She denied chest pain. Her 12-lead ECG revealed atrial fibrillation with controlled ventricular response of 84 beats per minute. Dr. Swaziland felt that her symptoms were directly related to her AF. Therefore, she was directed admitted to telemetry for further evaluation.   Hospital Course:  [name] was admitted on 02/28/2013 to [facility] telemetry. She was diuresed with IV Lasix. She underwent repeat echocardiography which revealed normal LVEF, mild to moderate atria dilatation and severe pulmonary hypertension as outlined above. Her QTc was too long for Tikosyn and given her previous intolerance to amiodarone, Multaq was chosen for AAD therapy. She started 400 mg twice daily. She remained on telemetry without any ventricular arrhythmias. She underwent DCCV and PPM reprogramming today which was successful for restoring SR. She remains hemodynamically stable. She has been continued on Pradaxa; however, given her advanced age, decreased CrCl and medication interactions between Multaq and Pradaxa (Multaq increases concentration of Pradaxa), her Pradaxa dose was decreased to 75 mg twice daily. She has been seen, examined and deemed stable for discharge today by Dr. Berton Mount. She will follow-up with Dr. Swaziland in 2-3 weeks.   Discharge Vitals: Blood pressure 121/69, pulse 75, temperature 97.3 F (36.3 C), temperature source Rectal, resp. rate 18, height 5\' 2"  (1.575 m), weight 152 lb 12.5 oz (69.3 kg), SpO2 97.00%.   Labs: Lab Results  Component Value Date   WBC 5.2 03/01/2013   HGB 12.0 03/01/2013   HCT 35.5* 03/01/2013   MCV 84.7 03/01/2013   PLT 172 03/01/2013     Recent Labs  Lab 03/03/13 0717  NA 127*  K 3.8  CL 87*  CO2 31  BUN 12  CREATININE 1.04  CALCIUM 8.7  GLUCOSE 92    Recent  Labs  03/02/13 0950  INR 1.69*    Disposition:  The patient is being discharged in stable condition.  Follow-up:     Follow-up Information   Follow up with Norma Fredrickson, NP On 03/19/2013. (At 3:30 PM for hospital follow-up)    Contact information:   [facility]  [address] [place] [address] [phone]     Discharge Medications:    Medication List    TAKE these medications       amLODipine 5 MG tablet  Commonly known as:  NORVASC  Take 1 tablet (5 mg total) by mouth daily.     cholecalciferol 1000 UNITS tablet  Commonly known as:  VITAMIN D  Take 1,000 Units by mouth daily.     CITRACAL PO  Take 2 tablets by mouth daily.     dabigatran 75 MG Caps  Commonly known as:  PRADAXA  Take 1 capsule (75 mg total) by mouth every 12 (twelve) hours. Hold PM dose today 03/03/2013. Start 75 mg twice daily tomorrow 03/04/2013..     divalproex 500 MG DR tablet  Commonly known as:  DEPAKOTE  Take 500 mg by mouth at bedtime.     dronedarone 400 MG tablet  Commonly known as:  MULTAQ  Take 1 tablet (400 mg total) by mouth 2 (two) times daily with a meal.     furosemide 40 MG tablet  Commonly known as:  LASIX  Take 1 tablet (40 mg total) by mouth daily.     HYDROcodone-acetaminophen 5-500 MG per tablet  Commonly known as:  VICODIN  Take 1 tablet by mouth every 6 (six) hours as needed. For pain       metoprolol tartrate 25 MG tablet  Commonly known as:  LOPRESSOR  Take 1 tablet (25 mg total) by mouth 2 (two) times daily.     multivitamins ther. w/minerals Tabs  Take 1 tablet by mouth daily.     olmesartan 40 MG tablet  Commonly known as:  BENICAR  Take 1 tablet (40 mg total) by mouth daily.     pantoprazole 40 MG tablet  Commonly known as:  PROTONIX  Take 40 mg by mouth daily.     polyethylene glycol powder powder  Commonly known as:  GLYCOLAX/MIRALAX  Take 8 g by mouth daily as needed. For constipation     potassium chloride 10  MEQ tablet  Commonly known as:  K-DUR,KLOR-CON  Take 1 tablet (10 mEq total) by mouth 2 (two) times daily.     timolol 0.5 % ophthalmic solution  Commonly known as:  BETIMOL  Place 1 drop into both eyes 2 (two) times daily.     tolterodine 4 MG 24 hr capsule  Commonly known as:  DETROL LA  Take 4 mg by mouth daily.     travoprost (benzalkonium) 0.004 % ophthalmic solution  Commonly known as:  TRAVATAN  Place 1 drop into the left eye at bedtime.       Duration of Discharge Encounter: Greater than 30 minutes including physician time.  Signed, Rick Duff, PA-C 03/03/2013, 2:07 PM

## 2013-03-05 ENCOUNTER — Telehealth: Payer: Self-pay | Admitting: Cardiology

## 2013-03-05 NOTE — Telephone Encounter (Signed)
New Problem  Pt was discharged from the hospital on Monday with a prescription for Multaq 400 MG.  Husband said [facility] has not processed the prescription yet. He wants to know if we have any samples they can get until they get the prescription. He said [facility] said they had to order the medicine, then they told him that [facility] systems were down so they could not process it.

## 2013-03-05 NOTE — Telephone Encounter (Signed)
Returned call to patient samples of multaq 400 mg left at front desk 3rd floor.

## 2013-03-06 DIAGNOSIS — M48061 Spinal stenosis, lumbar region without neurogenic claudication: Secondary | ICD-10-CM | POA: Diagnosis not present

## 2013-03-19 ENCOUNTER — Ambulatory Visit (INDEPENDENT_AMBULATORY_CARE_PROVIDER_SITE_OTHER): Payer: Medicare Other | Admitting: Nurse Practitioner

## 2013-03-19 ENCOUNTER — Other Ambulatory Visit: Payer: Self-pay | Admitting: *Deleted

## 2013-03-19 ENCOUNTER — Encounter: Payer: Self-pay | Admitting: Nurse Practitioner

## 2013-03-19 VITALS — BP 138/76 | HR 74 | Ht 62.0 in | Wt 148.0 lb

## 2013-03-19 DIAGNOSIS — I4891 Unspecified atrial fibrillation: Secondary | ICD-10-CM | POA: Diagnosis not present

## 2013-03-19 MED ORDER — DABIGATRAN ETEXILATE MESYLATE 75 MG PO CAPS: 75.0000 mg | ORAL_CAPSULE | Freq: Two times a day (BID) | ORAL | Status: DC

## 2013-03-19 NOTE — Progress Notes (Signed)
[name] Date of Birth: [birth year] Medical Record #[id number]  History of Present Illness: [name] is seen back today for a post hospital visit. Seen for Dr. Swaziland. She has multiple medical issues which include CHF, tachy brady with atrial fib, on anticoagulation with Pradaxa and has a pacemaker in place. cardioverted back in October of 2012 and January of 2014. Normal Lexiscan at [facility] in 2013. Previously on amiodarone but has had respiratory failure which was felt to be multifactorial from diastolic heart failure, aspiration pneumonitis and interstitial lung disease - no longer on amiodarone. Her other issues include HTN, RAS with past angioplasty, GERD, chronic back pain, hyponatremia, and SIADH.   Most recently admitted with volume overload due to recurrent atrial fib. Was diresed with IV lasix. Repeat echo showed a normal EF, mild to moderate atrial enlargement and severe pulmonary HTN. QT was too long for Tikosyn. Multaq was started. Pradaxa dose cut back due to her age, decreased CrCl and med interactions with Multaq (Multaq increases concentration of Pradaxa).   She comes in today. Here with her husband. Doing ok. Still weak. Breathing has improved. No more swelling. Weight is down 5 pounds. Overall, she is better. Tolerating her medicines.   Current Outpatient Prescriptions  Medication Sig Dispense Refill  . amLODipine (NORVASC) 5 MG tablet Take 1 tablet (5 mg total) by mouth daily.  90 tablet  4  . Calcium Citrate (CITRACAL PO) Take 2 tablets by mouth daily.       . cholecalciferol (VITAMIN D) 1000 UNITS tablet Take 1,000 Units by mouth daily.        . dabigatran (PRADAXA) 75 MG CAPS Take 1 capsule (75 mg total) by mouth every 12 (twelve) hours. Hold PM dose today 03/03/2013. Start 75 mg twice daily tomorrow 03/04/2013..  180 capsule  3  . divalproex (DEPAKOTE) 500 MG EC tablet Take 500 mg by mouth at bedtime.        . dronedarone (MULTAQ) 400 MG tablet Take 1 tablet (400 mg  total) by mouth 2 (two) times daily with a meal.  60 tablet  3  . furosemide (LASIX) 40 MG tablet Take 1 tablet (40 mg total) by mouth daily.  90 tablet  4  . HYDROcodone-acetaminophen (VICODIN) 5-500 MG per tablet Take 1 tablet by mouth every 6 (six) hours as needed. For pain       . metoprolol tartrate (LOPRESSOR) 25 MG tablet Take 1 tablet (25 mg total) by mouth 2 (two) times daily.  180 tablet  4  . Multiple Vitamins-Minerals (MULTIVITAMINS THER. W/MINERALS) TABS Take 1 tablet by mouth daily.        Marland Kitchen olmesartan (BENICAR) 40 MG tablet Take 1 tablet (40 mg total) by mouth daily.  90 tablet  4  . pantoprazole (PROTONIX) 40 MG tablet Take 40 mg by mouth daily.      . polyethylene glycol powder (GLYCOLAX/MIRALAX) powder Take 8 g by mouth daily as needed. For constipation      . potassium chloride (K-DUR,KLOR-CON) 10 MEQ tablet Take 1 tablet (10 mEq total) by mouth 2 (two) times daily.  180 tablet  4  . timolol (BETIMOL) 0.5 % ophthalmic solution Place 1 drop into both eyes 2 (two) times daily.       Marland Kitchen tolterodine (DETROL LA) 4 MG 24 hr capsule Take 4 mg by mouth daily.      . travoprost, benzalkonium, (TRAVATAN) 0.004 % ophthalmic solution Place 1 drop into the left eye at bedtime.  No current facility-administered medications for this visit.    No Known Allergies  Past Medical History  Diagnosis Date  . Pneumonia 04/2010  . Hypertension   . Renal artery stenosis 2008    STATUS POST ANGIOPLASTY  . GERD (gastroesophageal reflux disease)   . Back pain     CHRONIC  . Atrial fibrillation     Prior TEE/CV in May 2012; managed with rate control and anticoagulation  . Hyponatremia     improved  . Seizure disorder     on anti-epileptic therapy  . Urinary incontinence   . Osteopenia   . SIADH (syndrome of inappropriate ADH production)     h/o HYPONATREMIA ; LIKELY RELATED TO HER LUNG PROCESS  . Arthritis   . Hiatal hernia   . Tachy-brady syndrome     with pauses noted in May  2012; s/p PTVP October 2012  . Dysphagia   . CHF (congestive heart failure)   . Pacemaker     Past Surgical History  Procedure Laterality Date  . Cholecystectomy    . Back surgery      X2  . Total knee arthroplasty      RIGHT AND LEFT KNEE  . Tonsillectomy    . Appendectomy    . Cardiac catheterization  2007    Normal  . Knee arthroscopy      LEFT KNEE  . Cataract extraction    . Shoulder surgery    . Renal angioplasty  01/2007    RIGHT  . Partial hysterectomy    . Pacemaker insertion  06/2011    SJM Accent DR RF implanted by Dr Johney Frame  . Eye surgery      cataracts removed on both eyes  . Cardioversion    . Cardioversion  10/14/2012    Procedure: CARDIOVERSION;  Surgeon: Vesta Mixer, MD;  Location: [facility] ENDOSCOPY;  Service: Cardiovascular;  Laterality: N/A;    History  Smoking status  . Never Smoker   Smokeless tobacco  . Never Used    History  Alcohol Use No    Family History  Problem Relation Age of Onset  . Stroke Mother   . Diabetes Mother   . Heart disease Mother     Unkown  . Stroke Father   . Diabetes Father   . Cancer Father     PROSTATE  . Cancer Brother     RENAL AND BLADDER  . Lung disease Brother   . Lung disease Sister   . Hypertension Daughter   . Hypertension Son     Review of Systems: The review of systems is per the HPI.  All other systems were reviewed and are negative.  Physical Exam: BP 138/76  Pulse 74  Ht 5\' 2"  (1.575 m)  Wt 148 lb (67.132 kg)  BMI 27.06 kg/m2 Patient is very pleasant and in no acute distress. Skin is warm and dry. Color is normal.  HEENT is unremarkable. Normocephalic/atraumatic. PERRL. Sclera are nonicteric. Neck is supple. No masses. No JVD. Lungs are clear. Cardiac exam shows a regular rate and rhythm. Abdomen is soft. Extremities are without edema. Gait and ROM are intact. No gross neurologic deficits noted.   LABORATORY DATA: EKG shows atrial pacing with underlying sinus rhythm.   Lab Results    Component Value Date   WBC 5.2 03/01/2013   HGB 12.0 03/01/2013   HCT 35.5* 03/01/2013   PLT 172 03/01/2013   GLUCOSE 92 03/03/2013   ALT 26 11/23/2011   AST  31 11/23/2011   NA 127* 03/03/2013   K 3.8 03/03/2013   CL 87* 03/03/2013   CREATININE 1.04 03/03/2013   BUN 12 03/03/2013   CO2 31 03/03/2013   TSH 1.350 02/28/2013   INR 1.69* 03/02/2013   Assessment / Plan: 1. PAF - now on Multaq. Back in sinus. She is improved clinically.   2. Chronic anticoagulation with Pradaxa - this was refilled today  3. Diastolic heart failure - looks compensated.   I have left her on her current regimen. See her back in about 8 to 10 weeks.   Patient is agreeable to this plan and will call if any problems develop in the interim.   Rosalio Macadamia, RN, ANP-C [facility] [address] [place], [place]  [address]

## 2013-03-19 NOTE — Patient Instructions (Addendum)
I think you are doing ok.  Stay on your current medicines  I refilled the [facility] today to [facility]  See Dr. Swaziland in 6 to 8 weeks  Call the [facility] office at [phone] if you have any questions, problems or concerns.

## 2013-04-08 DIAGNOSIS — Z6827 Body mass index (BMI) 27.0-27.9, adult: Secondary | ICD-10-CM | POA: Diagnosis not present

## 2013-04-08 DIAGNOSIS — I4891 Unspecified atrial fibrillation: Secondary | ICD-10-CM | POA: Diagnosis not present

## 2013-04-08 DIAGNOSIS — I10 Essential (primary) hypertension: Secondary | ICD-10-CM | POA: Diagnosis not present

## 2013-04-08 DIAGNOSIS — J8410 Pulmonary fibrosis, unspecified: Secondary | ICD-10-CM | POA: Diagnosis not present

## 2013-04-08 DIAGNOSIS — G3184 Mild cognitive impairment, so stated: Secondary | ICD-10-CM | POA: Diagnosis not present

## 2013-04-08 DIAGNOSIS — I519 Heart disease, unspecified: Secondary | ICD-10-CM | POA: Diagnosis not present

## 2013-04-14 ENCOUNTER — Ambulatory Visit (INDEPENDENT_AMBULATORY_CARE_PROVIDER_SITE_OTHER): Payer: Medicare Other | Admitting: *Deleted

## 2013-04-14 DIAGNOSIS — Z950 Presence of cardiac pacemaker: Secondary | ICD-10-CM

## 2013-04-14 DIAGNOSIS — I4891 Unspecified atrial fibrillation: Secondary | ICD-10-CM

## 2013-04-15 LAB — REMOTE PACEMAKER DEVICE
AL IMPEDENCE PM: 430 Ohm
BAMS-0001: 150 {beats}/min
DEVICE MODEL PM: 7280874
RV LEAD AMPLITUDE: 12 mv
RV LEAD IMPEDENCE PM: 650 Ohm

## 2013-04-24 DIAGNOSIS — L570 Actinic keratosis: Secondary | ICD-10-CM | POA: Diagnosis not present

## 2013-04-24 DIAGNOSIS — C44621 Squamous cell carcinoma of skin of unspecified upper limb, including shoulder: Secondary | ICD-10-CM | POA: Diagnosis not present

## 2013-04-24 DIAGNOSIS — D045 Carcinoma in situ of skin of trunk: Secondary | ICD-10-CM | POA: Diagnosis not present

## 2013-04-24 DIAGNOSIS — L821 Other seborrheic keratosis: Secondary | ICD-10-CM | POA: Diagnosis not present

## 2013-04-24 DIAGNOSIS — D0460 Carcinoma in situ of skin of unspecified upper limb, including shoulder: Secondary | ICD-10-CM | POA: Diagnosis not present

## 2013-04-24 DIAGNOSIS — Z85828 Personal history of other malignant neoplasm of skin: Secondary | ICD-10-CM | POA: Diagnosis not present

## 2013-04-24 DIAGNOSIS — D485 Neoplasm of uncertain behavior of skin: Secondary | ICD-10-CM | POA: Diagnosis not present

## 2013-04-30 ENCOUNTER — Encounter: Payer: Self-pay | Admitting: *Deleted

## 2013-05-01 ENCOUNTER — Encounter: Payer: Self-pay | Admitting: Internal Medicine

## 2013-05-05 ENCOUNTER — Encounter: Payer: Self-pay | Admitting: Internal Medicine

## 2013-05-06 ENCOUNTER — Encounter: Payer: Self-pay | Admitting: Cardiology

## 2013-05-06 ENCOUNTER — Telehealth: Payer: Self-pay | Admitting: Cardiology

## 2013-05-06 NOTE — Telephone Encounter (Signed)
New Problem   Husband states pt is having trouble breathing /// States that it is continual.. Request an appt today.

## 2013-05-06 NOTE — Telephone Encounter (Signed)
Returned call to patient spoke to husband he stated wife is sob.Stated she cannot walk from room to room without being sob.Stated she has been sob for over 1 week and seems to be getting worse.No weight gain,no edema,no chest pain.No extender appointments available.Appointment scheduled with Dr.Jordan 05/08/13.Advised to go to ER if needed.

## 2013-05-06 NOTE — Telephone Encounter (Signed)
Spoke to Jacqueline Fredrickson NP she advised patient can come to office for a EKG to see if heart is out of rhythm.Spoke to husband he stated they will wait and just see Dr.Jordan 05/08/13.

## 2013-05-07 DIAGNOSIS — H401490 Capsular glaucoma with pseudoexfoliation of lens, unspecified eye, stage unspecified: Secondary | ICD-10-CM | POA: Diagnosis not present

## 2013-05-07 DIAGNOSIS — Z961 Presence of intraocular lens: Secondary | ICD-10-CM | POA: Diagnosis not present

## 2013-05-07 DIAGNOSIS — H268 Other specified cataract: Secondary | ICD-10-CM | POA: Diagnosis not present

## 2013-05-08 ENCOUNTER — Encounter: Payer: Medicare Other | Admitting: *Deleted

## 2013-05-08 ENCOUNTER — Ambulatory Visit (INDEPENDENT_AMBULATORY_CARE_PROVIDER_SITE_OTHER): Payer: Medicare Other | Admitting: Cardiology

## 2013-05-08 ENCOUNTER — Encounter: Payer: Self-pay | Admitting: Cardiology

## 2013-05-08 VITALS — BP 107/74 | HR 80 | Ht 62.0 in | Wt 151.4 lb

## 2013-05-08 DIAGNOSIS — I4891 Unspecified atrial fibrillation: Secondary | ICD-10-CM | POA: Diagnosis not present

## 2013-05-08 DIAGNOSIS — R0602 Shortness of breath: Secondary | ICD-10-CM

## 2013-05-08 DIAGNOSIS — I10 Essential (primary) hypertension: Secondary | ICD-10-CM

## 2013-05-08 DIAGNOSIS — I495 Sick sinus syndrome: Secondary | ICD-10-CM | POA: Diagnosis not present

## 2013-05-08 DIAGNOSIS — I509 Heart failure, unspecified: Secondary | ICD-10-CM

## 2013-05-08 DIAGNOSIS — I5032 Chronic diastolic (congestive) heart failure: Secondary | ICD-10-CM

## 2013-05-08 LAB — CBC WITH DIFFERENTIAL/PLATELET
Eosinophils Relative: 1.2 % (ref 0.0–5.0)
Monocytes Relative: 12.6 % — ABNORMAL HIGH (ref 3.0–12.0)
Neutrophils Relative %: 67.3 % (ref 43.0–77.0)
Platelets: 180 10*3/uL (ref 150.0–400.0)
WBC: 4.9 10*3/uL (ref 4.5–10.5)

## 2013-05-08 LAB — PACEMAKER DEVICE OBSERVATION
AL IMPEDENCE PM: 440 Ohm
ATRIAL PACING PM: 72
BATTERY VOLTAGE: 2.95 V
DEVICE MODEL PM: 7280874

## 2013-05-08 LAB — BASIC METABOLIC PANEL
BUN: 15 mg/dL (ref 6–23)
Calcium: 9.4 mg/dL (ref 8.4–10.5)
Creatinine, Ser: 1.4 mg/dL — ABNORMAL HIGH (ref 0.4–1.2)
GFR: 38.87 mL/min — ABNORMAL LOW (ref >60.00)

## 2013-05-08 NOTE — Progress Notes (Signed)
[name] Date of Birth: 22-Nov-1924 Medical Record #[id number]  History of Present Illness: [name] is seen as a work in visit.She has multiple medical issues which include CHF, tachy brady with atrial fib, on anticoagulation with Pradaxa and has a pacemaker in place. Cardioverted back in October of 2012 and January of 2014. Normal Lexiscan at [facility] in 2013. Previously on amiodarone but has had respiratory failure which was felt to be multifactorial from diastolic heart failure, aspiration pneumonitis and interstitial lung disease - no longer on amiodarone. Not a candidate for Tikosyn because of QT prolongation. She was started on Multaq in May. Her other issues include HTN, RAS with past angioplasty, GERD, chronic back pain, hyponatremia, and SIADH.   On followup today she reports a one-week history of increased shortness of breath, chest tightness, fatigue. She states it feels like she is out of rhythm again. She has no increase in edema and her weight is stable.  Current Outpatient Prescriptions  Medication Sig Dispense Refill  . amLODipine (NORVASC) 5 MG tablet Take 1 tablet (5 mg total) by mouth daily.  90 tablet  4  . Calcium Citrate (CITRACAL PO) Take 2 tablets by mouth daily.       . cholecalciferol (VITAMIN D) 1000 UNITS tablet Take 1,000 Units by mouth daily.        . dabigatran (PRADAXA) 75 MG CAPS Take 1 capsule (75 mg total) by mouth every 12 (twelve) hours. Hold PM dose today 03/03/2013. Start 75 mg twice daily tomorrow 03/04/2013..  180 capsule  3  . divalproex (DEPAKOTE) 500 MG EC tablet Take 500 mg by mouth at bedtime.        . dronedarone (MULTAQ) 400 MG tablet Take 1 tablet (400 mg total) by mouth 2 (two) times daily with a meal.  60 tablet  3  . esomeprazole (NEXIUM) 20 MG capsule Take 40 mg by mouth daily before breakfast.      . furosemide (LASIX) 40 MG tablet Take 1 tablet (40 mg total) by mouth daily.  90 tablet  4  . HYDROcodone-acetaminophen (VICODIN) 5-500 MG  per tablet Take 1 tablet by mouth every 6 (six) hours as needed. For pain       . metoprolol tartrate (LOPRESSOR) 25 MG tablet Take 1 tablet (25 mg total) by mouth 2 (two) times daily.  180 tablet  4  . Multiple Vitamins-Minerals (MULTIVITAMINS THER. W/MINERALS) TABS Take 1 tablet by mouth daily.        Marland Kitchen olmesartan (BENICAR) 40 MG tablet Take 1 tablet (40 mg total) by mouth daily.  90 tablet  4  . polyethylene glycol powder (GLYCOLAX/MIRALAX) powder Take 8 g by mouth daily as needed. For constipation      . potassium chloride (K-DUR,KLOR-CON) 10 MEQ tablet Take 1 tablet (10 mEq total) by mouth 2 (two) times daily.  180 tablet  4  . timolol (BETIMOL) 0.5 % ophthalmic solution Place 1 drop into both eyes 2 (two) times daily.       Marland Kitchen tolterodine (DETROL LA) 4 MG 24 hr capsule Take 4 mg by mouth daily.      . travoprost, benzalkonium, (TRAVATAN) 0.004 % ophthalmic solution Place 1 drop into the left eye at bedtime.        No current facility-administered medications for this visit.    No Known Allergies  Past Medical History  Diagnosis Date  . Pneumonia 04/2010  . Hypertension   . Renal artery stenosis 2008    STATUS  POST ANGIOPLASTY  . GERD (gastroesophageal reflux disease)   . Back pain     CHRONIC  . Atrial fibrillation     Prior TEE/CV in May 2012; managed with rate control and anticoagulation  . Hyponatremia     improved  . Seizure disorder     on anti-epileptic therapy  . Urinary incontinence   . Osteopenia   . SIADH (syndrome of inappropriate ADH production)     h/o HYPONATREMIA ; LIKELY RELATED TO HER LUNG PROCESS  . Arthritis   . Hiatal hernia   . Tachy-brady syndrome     with pauses noted in May 2012; s/p PTVP October 2012  . Dysphagia   . CHF (congestive heart failure)   . Pacemaker     Past Surgical History  Procedure Laterality Date  . Cholecystectomy    . Back surgery      X2  . Total knee arthroplasty      RIGHT AND LEFT KNEE  . Tonsillectomy    .  Appendectomy    . Cardiac catheterization  2007    Normal  . Knee arthroscopy      LEFT KNEE  . Cataract extraction    . Shoulder surgery    . Renal angioplasty  01/2007    RIGHT  . Partial hysterectomy    . Pacemaker insertion  06/2011    SJM Accent DR RF implanted by Dr Johney Frame  . Eye surgery      cataracts removed on both eyes  . Cardioversion    . Cardioversion  10/14/2012    Procedure: CARDIOVERSION;  Surgeon: Vesta Mixer, MD;  Location: [facility] ENDOSCOPY;  Service: Cardiovascular;  Laterality: N/A;    History  Smoking status  . Never Smoker   Smokeless tobacco  . Never Used    History  Alcohol Use No    Family History  Problem Relation Age of Onset  . Stroke Mother   . Diabetes Mother   . Heart disease Mother     Unkown  . Stroke Father   . Diabetes Father   . Cancer Father     PROSTATE  . Cancer Brother     RENAL AND BLADDER  . Lung disease Brother   . Lung disease Sister   . Hypertension Daughter   . Hypertension Son     Review of Systems: The review of systems is per the HPI.  All other systems were reviewed and are negative.  Physical Exam: BP 107/74  Pulse 80  Ht 5\' 2"  (1.575 m)  Wt 151 lb 6.4 oz (68.675 kg)  BMI 27.68 kg/m2  SpO2 98% Patient is very pleasant and in no acute distress. Skin is warm and dry. Color is normal.  HEENT is unremarkable. Normocephalic/atraumatic. PERRL. Sclera are nonicteric. Neck is supple. No masses. No JVD. Lungs reveal crackles in the right base. Cardiac exam shows a regular rate and rhythm. Abdomen is soft. Extremities are without edema. Gait and ROM are intact. No gross neurologic deficits noted.   LABORATORY DATA: EKG ventricular pacing at a rate of 86 beats per minute. No organized atrial activity noted.  Pacemaker interrogation confirms that she is in atrial fibrillation. In fact she was in atrial fibrillation for one week early in July. She was in sinus rhythm from July 6 through August 20. Her current episode  of atrial fibrillation started within the last 24 hours. No ventricular high rate episodes.  Lab Results  Component Value Date   WBC 5.2 03/01/2013  HGB 12.0 03/01/2013   HCT 35.5* 03/01/2013   PLT 172 03/01/2013   GLUCOSE 92 03/03/2013   ALT 26 11/23/2011   AST 31 11/23/2011   NA 127* 03/03/2013   K 3.8 03/03/2013   CL 87* 03/03/2013   CREATININE 1.04 03/03/2013   BUN 12 03/03/2013   CO2 31 03/03/2013   TSH 1.350 02/28/2013   INR 1.69* 03/02/2013   Assessment / Plan: 1. PAF - now on Multaq. Now out of rhythm again. Rate is well-controlled. She is on anticoagulation. This is probably responsible for some of her symptoms. She does appear to be going in and out of rhythm. Her antiarrhythmic options are limited. I will check lab work today including CBC, basic metabolic panel, TSH, and BNP level. She is scheduled to see me back on September 15. If we're unable to manage her atrial fibrillation with her current drug therapy may need to consider ablative therapy.  2. Chronic anticoagulation with Pradaxa - she is on reduced dose because of interaction with Multaq and because of her age.  3. Diastolic heart failure - will rate the results of her lab work today. If BNP level is elevated may need to increase her Lasix dose.

## 2013-05-16 ENCOUNTER — Telehealth: Payer: Self-pay | Admitting: Cardiology

## 2013-05-16 NOTE — Telephone Encounter (Signed)
Spoke with this patient, she is experiencing increased dyspnea,feels "out of rhythm",weak, not getting any better. Patient advised to go to ER if symptoms increase or change, will call patient back with sooner appt. with EP MD.

## 2013-05-16 NOTE — Telephone Encounter (Signed)
New Problem    Pt would like to discuss a cardioversion//pt is out of rhythm///not getting any better and would like to weigh all options.

## 2013-05-26 ENCOUNTER — Encounter: Payer: Self-pay | Admitting: Internal Medicine

## 2013-05-26 ENCOUNTER — Ambulatory Visit (INDEPENDENT_AMBULATORY_CARE_PROVIDER_SITE_OTHER): Payer: Medicare Other | Admitting: Internal Medicine

## 2013-05-26 VITALS — BP 102/68 | HR 80 | Ht 63.0 in | Wt 156.0 lb

## 2013-05-26 DIAGNOSIS — I495 Sick sinus syndrome: Secondary | ICD-10-CM | POA: Diagnosis not present

## 2013-05-26 DIAGNOSIS — I5033 Acute on chronic diastolic (congestive) heart failure: Secondary | ICD-10-CM

## 2013-05-26 DIAGNOSIS — I10 Essential (primary) hypertension: Secondary | ICD-10-CM

## 2013-05-26 DIAGNOSIS — I4891 Unspecified atrial fibrillation: Secondary | ICD-10-CM | POA: Diagnosis not present

## 2013-05-26 DIAGNOSIS — Z950 Presence of cardiac pacemaker: Secondary | ICD-10-CM

## 2013-05-26 DIAGNOSIS — Z7901 Long term (current) use of anticoagulants: Secondary | ICD-10-CM

## 2013-05-26 LAB — PACEMAKER DEVICE OBSERVATION
AL AMPLITUDE: 0.4 mv
BATTERY VOLTAGE: 2.9478 V
BRDY-0002RV: 70 {beats}/min

## 2013-05-26 NOTE — Patient Instructions (Signed)
Hospital will call you on Wed 05/28/13 as to when to come   Your physician has recommended you make the following change in your medication:  1) Stop Multaq

## 2013-05-26 NOTE — Progress Notes (Signed)
PCP:  [name], MD Primary Cardiologist:  Dr [place]  The patient presents today for electrophysiology followup.  Since last being seen in our clinic, she has returned to afib.  She does not tolerate afib.  She reports significant SOB and decreased exercise tolerance.  Today, she denies symptoms of palpitations, chest pain,  dizziness, presyncope, syncope, or neurologic sequela.  Her edema is stable.  The patient feels that she is tolerating medications without difficulties and is otherwise without complaint today.   Past Medical History  Diagnosis Date  . Pneumonia 04/2010  . Hypertension   . Renal artery stenosis 2008    STATUS POST ANGIOPLASTY  . GERD (gastroesophageal reflux disease)   . Back pain     CHRONIC  . Atrial fibrillation     Prior TEE/CV in May 2012; managed with rate control and anticoagulation  . Hyponatremia     improved  . Seizure disorder     on anti-epileptic therapy  . Urinary incontinence   . Osteopenia   . SIADH (syndrome of inappropriate ADH production)     h/o HYPONATREMIA ; LIKELY RELATED TO HER LUNG PROCESS  . Arthritis   . Hiatal hernia   . Tachy-brady syndrome     with pauses noted in May 2012; s/p PTVP October 2012  . Dysphagia   . CHF (congestive heart failure)   . Pacemaker    Past Surgical History  Procedure Laterality Date  . Cholecystectomy    . Back surgery      X2  . Total knee arthroplasty      RIGHT AND LEFT KNEE  . Tonsillectomy    . Appendectomy    . Cardiac catheterization  2007    Normal  . Knee arthroscopy      LEFT KNEE  . Cataract extraction    . Shoulder surgery    . Renal angioplasty  01/2007    RIGHT  . Partial hysterectomy    . Pacemaker insertion  06/2011    SJM Accent DR RF implanted by Dr Johney Frame  . Eye surgery      cataracts removed on both eyes  . Cardioversion    . Cardioversion  10/14/2012    Procedure: CARDIOVERSION;  Surgeon: Vesta Mixer, MD;  Location: [facility] ENDOSCOPY;  Service:  Cardiovascular;  Laterality: N/A;    Current Outpatient Prescriptions  Medication Sig Dispense Refill  . Calcium Citrate (CITRACAL PO) Take 2 tablets by mouth daily.       . cholecalciferol (VITAMIN D) 1000 UNITS tablet Take 1,000 Units by mouth daily.        . dabigatran (PRADAXA) 75 MG CAPS Take 1 capsule (75 mg total) by mouth every 12 (twelve) hours. Hold PM dose today 03/03/2013. Start 75 mg twice daily tomorrow 03/04/2013..  180 capsule  3  . divalproex (DEPAKOTE) 500 MG EC tablet Take 500 mg by mouth at bedtime.        . dronedarone (MULTAQ) 400 MG tablet Take 1 tablet (400 mg total) by mouth 2 (two) times daily with a meal.  60 tablet  3  . esomeprazole (NEXIUM) 20 MG capsule Take 40 mg by mouth daily before breakfast.      . furosemide (LASIX) 40 MG tablet Take 1 tablet (40 mg total) by mouth daily.  90 tablet  4  . HYDROcodone-acetaminophen (VICODIN) 5-500 MG per tablet Take 1 tablet by mouth every 6 (six) hours as needed. For pain       .  metoprolol tartrate (LOPRESSOR) 25 MG tablet Take 1 tablet (25 mg total) by mouth 2 (two) times daily.  180 tablet  4  . Multiple Vitamins-Minerals (MULTIVITAMINS THER. W/MINERALS) TABS Take 1 tablet by mouth daily.        Marland Kitchen olmesartan (BENICAR) 40 MG tablet Take 1 tablet (40 mg total) by mouth daily.  90 tablet  4  . polyethylene glycol powder (GLYCOLAX/MIRALAX) powder Take 8 g by mouth daily as needed. For constipation      . potassium chloride (K-DUR,KLOR-CON) 10 MEQ tablet Take 1 tablet (10 mEq total) by mouth 2 (two) times daily.  180 tablet  4  . timolol (BETIMOL) 0.5 % ophthalmic solution Place 1 drop into both eyes 2 (two) times daily.       Marland Kitchen tolterodine (DETROL LA) 4 MG 24 hr capsule Take 4 mg by mouth daily.      . travoprost, benzalkonium, (TRAVATAN) 0.004 % ophthalmic solution Place 1 drop into the left eye at bedtime.        No current facility-administered medications for this visit.    No Known Allergies  History   Social  History  . Marital Status: Married    Spouse Name: N/A    Number of Children: 3  . Years of Education: 10   Occupational History  . [employer]    1980   Social History Main Topics  . Smoking status: Never Smoker   . Smokeless tobacco: Never Used  . Alcohol Use: No  . Drug Use: No  . Sexual Activity: No   Other Topics Concern  . 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 ABLE TO AMBULATE WITHIN HOME, UP STAIRS WITHOUT INCIDENT AT BASELINE.                 Family History  Problem Relation Age of Onset  . Stroke Mother   . Diabetes Mother   . Heart disease Mother     Unkown  . Stroke Father   . Diabetes Father   . Cancer Father     PROSTATE  . Cancer Brother     RENAL AND BLADDER  . Lung disease Brother   . Lung disease Sister   . Hypertension Daughter   . Hypertension Son     ROS-  All systems are reviewed and are negative except as outlined in the HPI above  Physical Exam: Filed Vitals:   05/26/13 0826  BP: 102/68  Pulse: 80  Height: 5\' 3"  (1.6 m)  Weight: 156 lb (70.761 kg)    GEN- The patient is elderly appearing, alert and oriented x 3 today.   Head- normocephalic, atraumatic Eyes-  Sclera clear, conjunctiva pink Ears- hearing intact Oropharynx- clear Neck- supple, Lungs- diffuse dry rales, normal work of breathing Heart- irregular rate and rhythm,  GI- soft, NT, ND, + BS Extremities- no clubbing, cyanosis, or edema MS- diffuse atrophy Skin- no rash or lesion Psych- euthymic mood, full affect Neuro- strength and sensation are intact  ekg 8/14 is reviewed and reveals afib with V pacing,  QT is slightly prolonged but in the setting of a wide (paced) QRS Echo is reviewed, EF is preserved, moderate TR, PA  pressure is 90, moderate atrial enlargement  Assessment and Plan:  1. Persistent atrial fibrillation The patient does not well tolerate afib.  Unfortunately, she has significant structural heart disease with PA pressure 90, moderate TR, and atrial enlargement.  I think that our ability to maintain sinus rhythm long term is limited.  She has not tolerated amiodarone due to lung toxicity.  Multaq has become ineffective.  She is not a candidate for ablation due to her advanced age and structural heart disease. I think that at this point, sotalol may be our best option.  I will therefore stop multaq today.  After 2 day washout, she will be admitted for initiation of sotalol.  I would start sotalol 120mg  BID and follow closely in the hospital for QT changes.  As her pacemaker suggests adequate rate control during afib, I do not feel that an AV nodal ablation would offer her any benefit. Continue pradaxa for stroke prevention.  2. Tachy/brady Normal pacemaker function See Pace Art report No changes today  3. Acute on chronic diastolic dysfunction Will pursue sinus rhythm as above   4. HTN Stable No change required today

## 2013-05-28 ENCOUNTER — Encounter (HOSPITAL_COMMUNITY): Payer: Self-pay | Admitting: General Practice

## 2013-05-28 ENCOUNTER — Inpatient Hospital Stay (HOSPITAL_COMMUNITY)
Admission: RE | Admit: 2013-05-28 | Discharge: 2013-05-31 | DRG: 308 | Disposition: A | Discharge status: Home / Self Care | Payer: Medicare Other | Source: Ambulatory Visit | Attending: Internal Medicine | Admitting: Internal Medicine

## 2013-05-28 DIAGNOSIS — E236 Other disorders of pituitary gland: Secondary | ICD-10-CM | POA: Diagnosis not present

## 2013-05-28 DIAGNOSIS — M899 Disorder of bone, unspecified: Secondary | ICD-10-CM | POA: Diagnosis present

## 2013-05-28 DIAGNOSIS — I5033 Acute on chronic diastolic (congestive) heart failure: Secondary | ICD-10-CM

## 2013-05-28 DIAGNOSIS — N183 Chronic kidney disease, stage 3 unspecified: Secondary | ICD-10-CM | POA: Diagnosis present

## 2013-05-28 DIAGNOSIS — R32 Unspecified urinary incontinence: Secondary | ICD-10-CM | POA: Diagnosis present

## 2013-05-28 DIAGNOSIS — G8929 Other chronic pain: Secondary | ICD-10-CM | POA: Diagnosis present

## 2013-05-28 DIAGNOSIS — G40909 Epilepsy, unspecified, not intractable, without status epilepticus: Secondary | ICD-10-CM | POA: Diagnosis present

## 2013-05-28 DIAGNOSIS — R0602 Shortness of breath: Secondary | ICD-10-CM | POA: Diagnosis not present

## 2013-05-28 DIAGNOSIS — I10 Essential (primary) hypertension: Secondary | ICD-10-CM

## 2013-05-28 DIAGNOSIS — I4891 Unspecified atrial fibrillation: Principal | ICD-10-CM

## 2013-05-28 DIAGNOSIS — K449 Diaphragmatic hernia without obstruction or gangrene: Secondary | ICD-10-CM | POA: Diagnosis present

## 2013-05-28 DIAGNOSIS — I129 Hypertensive chronic kidney disease with stage 1 through stage 4 chronic kidney disease, or unspecified chronic kidney disease: Secondary | ICD-10-CM | POA: Diagnosis present

## 2013-05-28 DIAGNOSIS — I701 Atherosclerosis of renal artery: Secondary | ICD-10-CM

## 2013-05-28 DIAGNOSIS — I509 Heart failure, unspecified: Secondary | ICD-10-CM | POA: Diagnosis present

## 2013-05-28 DIAGNOSIS — M549 Dorsalgia, unspecified: Secondary | ICD-10-CM | POA: Diagnosis present

## 2013-05-28 DIAGNOSIS — Z7901 Long term (current) use of anticoagulants: Secondary | ICD-10-CM | POA: Diagnosis not present

## 2013-05-28 DIAGNOSIS — I495 Sick sinus syndrome: Secondary | ICD-10-CM

## 2013-05-28 DIAGNOSIS — K219 Gastro-esophageal reflux disease without esophagitis: Secondary | ICD-10-CM

## 2013-05-28 DIAGNOSIS — Z950 Presence of cardiac pacemaker: Secondary | ICD-10-CM | POA: Diagnosis not present

## 2013-05-28 DIAGNOSIS — E871 Hypo-osmolality and hyponatremia: Secondary | ICD-10-CM

## 2013-05-28 DIAGNOSIS — M129 Arthropathy, unspecified: Secondary | ICD-10-CM | POA: Diagnosis present

## 2013-05-28 DIAGNOSIS — J8410 Pulmonary fibrosis, unspecified: Secondary | ICD-10-CM | POA: Diagnosis not present

## 2013-05-28 DIAGNOSIS — I5032 Chronic diastolic (congestive) heart failure: Secondary | ICD-10-CM

## 2013-05-28 HISTORY — DX: Unspecified diastolic (congestive) heart failure: I50.30

## 2013-05-28 LAB — CBC
HCT: 36.2 % (ref 36.0–46.0)
Hemoglobin: 12.3 g/dL (ref 12.0–15.0)
MCV: 84.8 fL (ref 78.0–100.0)
RDW: 16.6 % — ABNORMAL HIGH (ref 11.5–15.5)
WBC: 5.3 10*3/uL (ref 4.0–10.5)

## 2013-05-28 LAB — BASIC METABOLIC PANEL
BUN: 15 mg/dL (ref 6–23)
CO2: 31 mEq/L (ref 19–32)
Chloride: 92 mEq/L — ABNORMAL LOW (ref 96–112)
Creatinine, Ser: 1.17 mg/dL — ABNORMAL HIGH (ref 0.50–1.10)
GFR calc Af Amer: 48 mL/min — ABNORMAL LOW (ref >90)
Glucose, Bld: 87 mg/dL (ref 70–99)
Potassium: 4.1 mEq/L (ref 3.5–5.1)

## 2013-05-28 MED ORDER — OFF THE BEAT BOOK
Freq: Once | Status: AC
  Administered 2013-05-28: 21:00:00
  Filled 2013-05-28: qty 1

## 2013-05-28 MED ORDER — METOPROLOL TARTRATE 25 MG PO TABS
25.0000 mg | ORAL_TABLET | Freq: Two times a day (BID) | ORAL | Status: DC
Start: 2013-05-28 — End: 2013-05-31
  Administered 2013-05-28 – 2013-05-31 (×6): 25 mg via ORAL
  Filled 2013-05-28 (×7): qty 1

## 2013-05-28 MED ORDER — TRAVOPROST (BAK FREE) 0.004 % OP SOLN
1.0000 [drp] | Freq: Every day | OPHTHALMIC | Status: DC
  Administered 2013-05-28 – 2013-05-30 (×3): 1 [drp] via OPHTHALMIC
  Filled 2013-05-28: qty 2.5

## 2013-05-28 MED ORDER — PANTOPRAZOLE SODIUM 40 MG PO TBEC
40.0000 mg | DELAYED_RELEASE_TABLET | Freq: Every day | ORAL | Status: DC
  Administered 2013-05-29 – 2013-05-31 (×3): 40 mg via ORAL
  Filled 2013-05-28 (×3): qty 1

## 2013-05-28 MED ORDER — TIMOLOL MALEATE 0.5 % OP SOLN
1.0000 [drp] | Freq: Two times a day (BID) | OPHTHALMIC | Status: DC
  Administered 2013-05-28 – 2013-05-31 (×6): 1 [drp] via OPHTHALMIC
  Filled 2013-05-28: qty 5

## 2013-05-28 MED ORDER — FESOTERODINE FUMARATE ER 4 MG PO TB24
4.0000 mg | ORAL_TABLET | Freq: Every day | ORAL | Status: DC
  Administered 2013-05-29 – 2013-05-31 (×3): 4 mg via ORAL
  Filled 2013-05-28 (×4): qty 1

## 2013-05-28 MED ORDER — DIVALPROEX SODIUM 500 MG PO DR TAB
500.0000 mg | DELAYED_RELEASE_TABLET | Freq: Every day | ORAL | Status: DC
  Administered 2013-05-28 – 2013-05-30 (×3): 500 mg via ORAL
  Filled 2013-05-28 (×4): qty 1

## 2013-05-28 MED ORDER — FUROSEMIDE 40 MG PO TABS
40.0000 mg | ORAL_TABLET | Freq: Every day | ORAL | Status: DC
  Administered 2013-05-29 – 2013-05-31 (×3): 40 mg via ORAL
  Filled 2013-05-28 (×4): qty 1

## 2013-05-28 MED ORDER — DABIGATRAN ETEXILATE MESYLATE 75 MG PO CAPS
75.0000 mg | ORAL_CAPSULE | Freq: Two times a day (BID) | ORAL | Status: DC
  Administered 2013-05-28 – 2013-05-31 (×6): 75 mg via ORAL
  Filled 2013-05-28 (×7): qty 1

## 2013-05-28 MED ORDER — POTASSIUM CHLORIDE CRYS ER 10 MEQ PO TBCR
10.0000 meq | EXTENDED_RELEASE_TABLET | Freq: Two times a day (BID) | ORAL | Status: DC
  Administered 2013-05-28 – 2013-05-31 (×6): 10 meq via ORAL
  Filled 2013-05-28 (×7): qty 1

## 2013-05-28 MED ORDER — IRBESARTAN 75 MG PO TABS
75.0000 mg | ORAL_TABLET | Freq: Every day | ORAL | Status: DC
  Administered 2013-05-29 – 2013-05-31 (×3): 75 mg via ORAL
  Filled 2013-05-28 (×4): qty 1

## 2013-05-28 MED ORDER — INFLUENZA VAC SPLIT QUAD 0.5 ML IM SUSP
0.5000 mL | INTRAMUSCULAR | Status: AC
  Administered 2013-05-29: 0.5 mL via INTRAMUSCULAR
  Filled 2013-05-28: qty 0.5

## 2013-05-28 MED ORDER — ACETAMINOPHEN 325 MG PO TABS: 650.0000 mg | ORAL_TABLET | ORAL | Status: DC | PRN

## 2013-05-28 MED ORDER — SOTALOL HCL 120 MG PO TABS
120.0000 mg | ORAL_TABLET | Freq: Every day | ORAL | Status: DC
  Administered 2013-05-28 – 2013-05-31 (×4): 120 mg via ORAL
  Filled 2013-05-28 (×4): qty 1

## 2013-05-28 MED ORDER — TIMOLOL HEMIHYDRATE 0.5 % OP SOLN
1.0000 [drp] | Freq: Two times a day (BID) | OPHTHALMIC | Status: DC
  Filled 2013-05-28 (×15): qty 0.1

## 2013-05-28 MED ORDER — HYDROCODONE-ACETAMINOPHEN 5-325 MG PO TABS: 1.0000 | ORAL_TABLET | Freq: Four times a day (QID) | ORAL | Status: DC | PRN

## 2013-05-28 NOTE — H&P (Addendum)
PCP:  [name], MD Primary Cardiologist: Dr [place]   [name] is admitted for sotalol initiation  She has CHF, tachy brady with atrial fib, is on anticoagulation with Pradaxa. She is 87. She has a pacemaker in place. She was cardioverted successfully in October of 2012 and again in Jan. 2014. Last echo in 2014 showed a normal EF with moderate LAE 41 mm  She had a prior normal Lexiscan study in [place] in 2013. She was previously on amiodarone in 2012. She had an extensive hospitalization in December 2012 with respiratory failure that was felt to be multifactorial including diastolic heart failure, aspiration pneumonitis, and some interstitial lung disease. Amiodarone was discontinued.   She has been managed with rate control  But  found to be in atrial fibrillation with controlled ventricular response of 84 beats per minute. seh was admitted for DCCV in 6/14 and subsequently started on MULTAQ, tikosyn having been considered but after i looked at a number of ECGs felt QT was too long  She has failed MULTAQ and this has been stopped and she is admitted now, with some what better QT for sotalol   She has significant symptoms with her afib  She has class 3 kidney disease with GFR about 35-40  Past Medical History  Diagnosis Date  . Pneumonia 04/2010  . Hypertension   . Renal artery stenosis 2008    STATUS POST ANGIOPLASTY  . GERD (gastroesophageal reflux disease)   . Back pain     CHRONIC  . Atrial fibrillation     Prior TEE/CV in May 2012; managed with rate control and anticoagulation  . Hyponatremia     improved  . Seizure disorder     on anti-epileptic therapy  . Urinary incontinence   . Osteopenia   . SIADH (syndrome of inappropriate ADH production)     h/o HYPONATREMIA ; LIKELY RELATED TO HER LUNG PROCESS  . Arthritis   . Hiatal hernia   . Tachy-brady syndrome     with pauses noted in May 2012; s/p PTVP October 2012  . Dysphagia   . CHF (congestive heart  failure)   . Pacemaker    Past Surgical History  Procedure Laterality Date  . Cholecystectomy    . Back surgery      X2  . Total knee arthroplasty      RIGHT AND LEFT KNEE  . Tonsillectomy    . Appendectomy    . Cardiac catheterization  2007    Normal  . Knee arthroscopy      LEFT KNEE  . Cataract extraction    . Shoulder surgery    . Renal angioplasty  01/2007    RIGHT  . Partial hysterectomy    . Pacemaker insertion  06/2011    SJM Accent DR RF implanted by Dr Johney Frame  . Eye surgery      cataracts removed on both eyes  . Cardioversion    . Cardioversion  10/14/2012    Procedure: CARDIOVERSION;  Surgeon: Vesta Mixer, MD;  Location: [facility] ENDOSCOPY;  Service: Cardiovascular;  Laterality: N/A;    Current Facility-Administered Medications  Medication Dose Route Frequency Provider Last Rate Last Dose  . [START ON 05/29/2013] influenza vac split quadrivalent PF (FLUARIX) injection 0.5 mL  0.5 mL Intramuscular Tomorrow-1000 Hillis Range, MD        No Known Allergies  History   Social History  . Marital Status: Married    Spouse Name: N/A    Number  of Children: 3  . Years of Education: 10   Occupational History  . [name]    [employer]   Social History Main Topics  . Smoking status: Never Smoker   . Smokeless tobacco: Never Used  . Alcohol Use: No  . Drug Use: No  . Sexual Activity: No   Other Topics Concern  . 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 ABLE TO AMBULATE WITHIN HOME, UP STAIRS WITHOUT INCIDENT AT BASELINE.                 Family History  Problem Relation Age of Onset  . Stroke Mother   . Diabetes Mother   . Heart disease Mother     Unkown  . Stroke Father   . Diabetes Father   . Cancer Father      PROSTATE  . Cancer Brother     RENAL AND BLADDER  . Lung disease Brother   . Lung disease Sister   . Hypertension Daughter   . Hypertension Son     ROS-  All systems are reviewed and are negative except as outlined in the HPI above  Physical Exam: Filed Vitals:   05/28/13 1543 05/28/13 1622  BP:  122/73  Pulse:  79  Temp:  97.4 F (36.3 C)  TempSrc:  Oral  Resp:  20  Height: 5\' 3"  (1.6 m)   Weight: 151 lb (68.493 kg)   SpO2:  97%    GEN- The patient is elderly appearing, alert and oriented x 3 today.   Head- normocephalic, atraumatic Eyes-  Sclera clear, conjunctiva pink Ears- hearing intact Oropharynx- clear Neck- supple, Lungs- diffuse dry rales, normal work of breathing Heart- irregular rate and rhythm,  GI- soft, NT, ND, + BS Extremities- no clubbing, cyanosis, or edema MS- diffuse atrophy Skin- no rash or lesion Psych- euthymic mood, full affect Neuro- strength and sensation are intact  ekg 8/14 is reviewed and reveals afib with V pacing,  QT is slightly prolonged but in the setting of a wide (paced) QRS Echo is reviewed, EF is preserved, moderate TR, PA pressure is 90, moderate atrial enlargement  Assessment and Plan:  1. Persistent atrial fibrillation The patient does not well tolerate afib.  Unfortunately, she has significant structural heart disease with PA pressure 90, moderate TR, and atrial enlargement.  I think that our ability to maintain sinus rhythm long term is limited.  She has not tolerated amiodarone due to lung toxicity.  Multaq has become ineffective. Its t 1/2 is 12-19 so should be largely gone   Dr Hillis Range would like to begin sotalol. Based on renal function, her dosing intrerval is appropriately q24 hrs  I have reveiwed with the family concerns re proarrhythmia and QT prolongation  Acute on chronic diastolic dysfunction   ECG >> QTc 477 with QRS durationof aobut 140 msec     According to PPM interrogation, she has had more  persistent AF since mid August. She took her last dose of Multaq on Monday, 05/26/2013 at 8:00 AM     \

## 2013-05-28 NOTE — Progress Notes (Deleted)
PCP:  [name], MD Primary Cardiologist: Dr [place]  The patient presents today for electrophysiology followup.  Since last being seen in our clinic, she has returned to afib.  She does not tolerate afib.  She reports significant SOB and decreased exercise tolerance.  Today, she denies symptoms of palpitations, chest pain,  dizziness, presyncope, syncope, or neurologic sequela.  Her edema is stable.  The patient feels that she is tolerating medications without difficulties and is otherwise without complaint today.   Past Medical History  Diagnosis Date  . Pneumonia 04/2010  . Hypertension   . Renal artery stenosis 2008    STATUS POST ANGIOPLASTY  . GERD (gastroesophageal reflux disease)   . Back pain     CHRONIC  . Atrial fibrillation     Prior TEE/CV in May 2012; managed with rate control and anticoagulation  . Hyponatremia     improved  . Seizure disorder     on anti-epileptic therapy  . Urinary incontinence   . Osteopenia   . SIADH (syndrome of inappropriate ADH production)     h/o HYPONATREMIA ; LIKELY RELATED TO HER LUNG PROCESS  . Arthritis   . Hiatal hernia   . Tachy-brady syndrome     with pauses noted in May 2012; s/p PTVP October 2012  . Dysphagia   . CHF (congestive heart failure)   . Pacemaker    Past Surgical History  Procedure Laterality Date  . Cholecystectomy    . Back surgery      X2  . Total knee arthroplasty      RIGHT AND LEFT KNEE  . Tonsillectomy    . Appendectomy    . Cardiac catheterization  2007    Normal  . Knee arthroscopy      LEFT KNEE  . Cataract extraction    . Shoulder surgery    . Renal angioplasty  01/2007    RIGHT  . Partial hysterectomy    . Pacemaker insertion  06/2011    SJM Accent DR RF implanted by Dr Johney Frame  . Eye surgery      cataracts removed on both eyes  . Cardioversion    . Cardioversion  10/14/2012    Procedure: CARDIOVERSION;  Surgeon: Vesta Mixer, MD;  Location: [facility] ENDOSCOPY;  Service: Cardiovascular;   Laterality: N/A;    Current Facility-Administered Medications  Medication Dose Route Frequency Provider Last Rate Last Dose  . [START ON 05/29/2013] influenza vac split quadrivalent PF (FLUARIX) injection 0.5 mL  0.5 mL Intramuscular Tomorrow-1000 Hillis Range, MD        No Known Allergies  History   Social History  . Marital Status: Married    Spouse Name: N/A    Number of Children: 3  . Years of Education: 10   Occupational History  . [employer]    [employer]   Social History Main Topics  . Smoking status: Never Smoker   . Smokeless tobacco: Never Used  . Alcohol Use: No  . Drug Use: No  . Sexual Activity: No   Other Topics Concern  . 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 ABLE TO AMBULATE WITHIN HOME, UP STAIRS WITHOUT INCIDENT AT BASELINE.  Family History  Problem Relation Age of Onset  . Stroke Mother   . Diabetes Mother   . Heart disease Mother     Unkown  . Stroke Father   . Diabetes Father   . Cancer Father     PROSTATE  . Cancer Brother     RENAL AND BLADDER  . Lung disease Brother   . Lung disease Sister   . Hypertension Daughter   . Hypertension Son     ROS-  All systems are reviewed and are negative except as outlined in the HPI above  Physical Exam: Filed Vitals:   05/28/13 1543 05/28/13 1622  BP:  122/73  Pulse:  79  Temp:  97.4 F (36.3 C)  TempSrc:  Oral  Resp:  20  Height: 5\' 3"  (1.6 m)   Weight: 151 lb (68.493 kg)   SpO2:  97%    GEN- The patient is elderly appearing, alert and oriented x 3 today.   Head- normocephalic, atraumatic Eyes-  Sclera clear, conjunctiva pink Ears- hearing intact Oropharynx- clear Neck- supple, Lungs- diffuse dry rales, normal work of  breathing Heart- irregular rate and rhythm,  GI- soft, NT, ND, + BS Extremities- no clubbing, cyanosis, or edema MS- diffuse atrophy Skin- no rash or lesion Psych- euthymic mood, full affect Neuro- strength and sensation are intact  ekg 8/14 is reviewed and reveals afib with V pacing,  QT is slightly prolonged but in the setting of a wide (paced) QRS Echo is reviewed, EF is preserved, moderate TR, PA pressure is 90, moderate atrial enlargement  Assessment and Plan:  1. Persistent atrial fibrillation The patient does not well tolerate afib.  Unfortunately, she has significant structural heart disease with PA pressure 90, moderate TR, and atrial enlargement.  I think that our ability to maintain sinus rhythm long term is limited.  She has not tolerated amiodarone due to lung toxicity.  Multaq has become ineffective.  She is not a candidate for ablation due to her advanced age and structural heart disease. I think that at this point, sotalol may be our best option.  I will therefore stop multaq today.  After 2 day washout, she will be admitted for initiation of sotalol.  I would start sotalol 120mg  BID and follow closely in the hospital for QT changes.  As her pacemaker suggests adequate rate control during afib, I do not feel that an AV nodal ablation would offer her any benefit. Continue pradaxa for stroke prevention.  2. Tachy/brady Normal pacemaker function See Pace Art report No changes today  3. Acute on chronic diastolic dysfunction Will pursue sinus rhythm as above  4. HTN Stable No change required today  Hillis Range, MD   ADDENDUM: Mrs. Rabanal presents for direct admission for sotalol loading. She continues to have fatigue, DOE and exercise intolerance. According to [facility] interrogation, she has had more persistent AF since mid August. She took her last dose of Multaq on Monday, 05/26/2013 at 8:00 AM. Her serum Cr is 1.4. CrCl estimated to be 38 mL/min so will need dosing  adjustment. Dr. Graciela Husbands to see.

## 2013-05-29 DIAGNOSIS — I5033 Acute on chronic diastolic (congestive) heart failure: Secondary | ICD-10-CM | POA: Diagnosis not present

## 2013-05-29 DIAGNOSIS — J8410 Pulmonary fibrosis, unspecified: Secondary | ICD-10-CM | POA: Diagnosis not present

## 2013-05-29 DIAGNOSIS — E236 Other disorders of pituitary gland: Secondary | ICD-10-CM | POA: Diagnosis not present

## 2013-05-29 DIAGNOSIS — I4891 Unspecified atrial fibrillation: Secondary | ICD-10-CM | POA: Diagnosis not present

## 2013-05-29 LAB — BASIC METABOLIC PANEL
BUN: 13 mg/dL (ref 6–23)
CO2: 31 mEq/L (ref 19–32)
Chloride: 92 mEq/L — ABNORMAL LOW (ref 96–112)
Creatinine, Ser: 1.08 mg/dL (ref 0.50–1.10)
Glucose, Bld: 86 mg/dL (ref 70–99)
Potassium: 3.8 mEq/L (ref 3.5–5.1)

## 2013-05-29 NOTE — Progress Notes (Signed)
     Patient: [name] Date of Encounter: 05/29/2013, 7:41 AM Admit date: 05/28/2013     Subjective  [name] has no new complaints. Admitted yesterday for sotalol loading for AFib.   Objective  Physical Exam: Vitals: BP 134/77  Pulse 81  Temp(Src) 98.7 F (37.1 C) (Oral)  Resp 18  Ht 5\' 3"  (1.6 m)  Wt 151 lb (68.493 kg)  BMI 26.76 kg/m2  SpO2 96% General: Well developed, well appearing 79 year old female in no acute distress. Neck: Supple. JVD not elevated. Lungs: Clear bilaterally to auscultation without wheezes, rales, or rhonchi. Breathing is unlabored. Heart: Irregular S1 S2 without murmur, rub or gallop.  Abdomen: Soft, non-distended. Extremities: No clubbing or cyanosis. Trace pedal edema.  Distal pedal pulses are 2+ and equal bilaterally. Neuro: Alert and oriented X 3. Moves all extremities spontaneously. No focal deficits.  Intake/Output: No intake or output data in the 24 hours ending 05/29/13 0741  Inpatient Medications:  . dabigatran  75 mg Oral Q12H  . divalproex  500 mg Oral QHS  . fesoterodine  4 mg Oral Daily  . furosemide  40 mg Oral Daily  . influenza vac split quadrivalent PF  0.5 mL Intramuscular Tomorrow-1000  . irbesartan  75 mg Oral Daily  . metoprolol tartrate  25 mg Oral BID  . pantoprazole  40 mg Oral Daily  . potassium chloride  10 mEq Oral BID  . sotalol  120 mg Oral Daily  . timolol  1 drop Both Eyes BID  . Travoprost (BAK Free)  1 drop Left Eye QHS    Labs:  Recent Labs  05/28/13 1910 05/29/13 0446  NA 132* 132*  K 4.1 3.8  CL 92* 92*  CO2 31 31  GLUCOSE 87 86  BUN 15 13  CREATININE 1.17* 1.08  CALCIUM 9.5 9.5  MG 2.2 2.2    Recent Labs  05/28/13 1910  WBC 5.3  HGB 12.3  HCT 36.2  MCV 84.8  PLT 167    Radiology/Studies: No results found.  Telemetry: shows persistent AFib with intermittent V pacing 12-lead ECG: AF with V pacing; QTc 479 msec     Assessment and Plan  1. Persistent AFib - continue sotalol  120 mg once daily (adjusted for CrCl) - continue daily BMET, Mg and ECG 2. Normal LVEF 3. CKD 4. Tachy-brady syndrome, PPM in place 5. HTN 6. Seizure disorder 7. SIADH  Dr. Ladona Ridgel to see Signed, [name]  EP Attending  Patient seen and examined. She will continue her sotalol loading. Will follow her QT duration. Follow tele. No change in sotalol dosing at this time.  [name].D.

## 2013-05-29 NOTE — Care Management Note (Unsigned)
    Page 1 of 1   05/29/2013     11:53:21 AM   CARE MANAGEMENT NOTE 05/29/2013  Patient:  [name], [name]   Account Number:  [id number]  Date Initiated:  05/29/2013  Documentation initiated by:  GRAVES-BIGELOW,Lindalee Huizinga  Subjective/Objective Assessment:   Pt admitted for sotalol initiation-Persistent atrial fibrillation.     Action/Plan:   CM will continue to monitor for disposition needs.   Anticipated DC Date:  05/31/2013   Anticipated DC Plan:  HOME/SELF CARE      DC Planning Services  CM consult      Choice offered to / List presented to:             Status of service:  In process, will continue to follow Medicare Important Message given?   (If response is "NO", the following Medicare IM given date fields will be blank) Date Medicare IM given:   Date Additional Medicare IM given:    Discharge Disposition:    Per UR Regulation:  Reviewed for med. necessity/level of care/duration of stay  If discussed at Long Length of Stay Meetings, dates discussed:    Comments:

## 2013-05-30 ENCOUNTER — Encounter (HOSPITAL_COMMUNITY): Payer: Self-pay | Admitting: Critical Care Medicine

## 2013-05-30 ENCOUNTER — Inpatient Hospital Stay (HOSPITAL_COMMUNITY): Payer: Medicare Other | Admitting: Certified Registered"

## 2013-05-30 ENCOUNTER — Encounter (HOSPITAL_COMMUNITY)
Admission: RE | Disposition: A | Discharge status: Home / Self Care | Payer: Self-pay | Source: Ambulatory Visit | Attending: Internal Medicine

## 2013-05-30 ENCOUNTER — Encounter (HOSPITAL_COMMUNITY): Payer: Self-pay | Admitting: Certified Registered"

## 2013-05-30 DIAGNOSIS — I5033 Acute on chronic diastolic (congestive) heart failure: Secondary | ICD-10-CM | POA: Diagnosis not present

## 2013-05-30 DIAGNOSIS — Z950 Presence of cardiac pacemaker: Secondary | ICD-10-CM

## 2013-05-30 DIAGNOSIS — Z7901 Long term (current) use of anticoagulants: Secondary | ICD-10-CM | POA: Diagnosis not present

## 2013-05-30 DIAGNOSIS — K219 Gastro-esophageal reflux disease without esophagitis: Secondary | ICD-10-CM | POA: Diagnosis not present

## 2013-05-30 DIAGNOSIS — I495 Sick sinus syndrome: Secondary | ICD-10-CM

## 2013-05-30 DIAGNOSIS — E236 Other disorders of pituitary gland: Secondary | ICD-10-CM | POA: Diagnosis not present

## 2013-05-30 DIAGNOSIS — I4891 Unspecified atrial fibrillation: Secondary | ICD-10-CM | POA: Diagnosis not present

## 2013-05-30 DIAGNOSIS — I10 Essential (primary) hypertension: Secondary | ICD-10-CM

## 2013-05-30 DIAGNOSIS — J8410 Pulmonary fibrosis, unspecified: Secondary | ICD-10-CM | POA: Diagnosis not present

## 2013-05-30 HISTORY — PX: CARDIOVERSION: SHX1299

## 2013-05-30 LAB — BASIC METABOLIC PANEL
BUN: 12 mg/dL (ref 6–23)
CO2: 29 mEq/L (ref 19–32)
Chloride: 91 mEq/L — ABNORMAL LOW (ref 96–112)
Glucose, Bld: 94 mg/dL (ref 70–99)
Potassium: 4.1 mEq/L (ref 3.5–5.1)
Sodium: 131 mEq/L — ABNORMAL LOW (ref 135–145)

## 2013-05-30 SURGERY — CARDIOVERSION
Anesthesia: General | Wound class: Clean

## 2013-05-30 MED ORDER — PROPOFOL 10 MG/ML IV BOLUS
INTRAVENOUS | Status: DC | PRN
  Administered 2013-05-30: 40 mg via INTRAVENOUS
  Administered 2013-05-30: 30 mg via INTRAVENOUS

## 2013-05-30 MED ORDER — SODIUM CHLORIDE 0.9 % IV SOLN
INTRAVENOUS | Status: DC | PRN
  Administered 2013-05-30: 16:00:00 via INTRAVENOUS

## 2013-05-30 NOTE — Transfer of Care (Signed)
Immediate Anesthesia Transfer of Care Note  Patient: [name]  Procedure(s) Performed: Procedure(s): CARDIOVERSION (N/A)  Patient Location: Nursing Unit  Anesthesia Type:General  Level of Consciousness: awake and alert   Airway & Oxygen Therapy: Patient Spontanous Breathing and Patient connected to nasal cannula oxygen  Post-op Assessment: Report given to PACU RN, Post -op Vital signs reviewed and stable and Patient moving all extremities X 4  Post vital signs: Reviewed and stable  Complications: No apparent anesthesia complications

## 2013-05-30 NOTE — Anesthesia Postprocedure Evaluation (Signed)
  Anesthesia Post-op Note  Patient: [name]  Procedure(s) Performed: Procedure(s): CARDIOVERSION (N/A)  Patient Location: Nursing Unit  Anesthesia Type:General  Level of Consciousness: awake and alert   Airway and Oxygen Therapy: Patient Spontanous Breathing and Patient connected to nasal cannula oxygen  Post-op Pain: none  Post-op Assessment: Post-op Vital signs reviewed, Patient's Cardiovascular Status Stable, Respiratory Function Stable, Patent Airway and No signs of Nausea or vomiting  Post-op Vital Signs: Reviewed and stable  Complications: No apparent anesthesia complications

## 2013-05-30 NOTE — Progress Notes (Addendum)
     Patient: [name] Date of Encounter: 05/30/2013, 9:06 AM Admit date: 05/28/2013     Subjective  [name] has no new complaints. She continues to have SOB but denies any worsening. Admitted for sotalol loading for AFib.   Objective  Physical Exam: Vitals: BP 130/70  Pulse 80  Temp(Src) 98.3 F (36.8 C) (Oral)  Resp 18  Ht 5\' 3"  (1.6 m)  Wt 151 lb (68.493 kg)  BMI 26.76 kg/m2  SpO2 93% General: Well developed 79 year old female in no acute distress. Neck: Supple. JVD not elevated. Lungs: Bibasilar rales. Otherwise clear to auscultation without wheezes or rhonchi. Breathing is unlabored. Heart: Irregular S1 S2 without murmur, rub or gallop.  Abdomen: Soft, non-distended. Extremities: No clubbing or cyanosis. Trace pedal edema.  Distal pedal pulses are 2+ and equal bilaterally. Neuro: Alert and oriented X 3. Moves all extremities spontaneously. No focal deficits.  Intake/Output:  Intake/Output Summary (Last 24 hours) at 05/30/13 0906 Last data filed at 05/30/13 0506  Gross per 24 hour  Intake    540 ml  Output    350 ml  Net    190 ml    Inpatient Medications:  . dabigatran  75 mg Oral Q12H  . divalproex  500 mg Oral QHS  . fesoterodine  4 mg Oral Daily  . furosemide  40 mg Oral Daily  . irbesartan  75 mg Oral Daily  . metoprolol tartrate  25 mg Oral BID  . pantoprazole  40 mg Oral Daily  . potassium chloride  10 mEq Oral BID  . sotalol  120 mg Oral Daily  . timolol  1 drop Both Eyes BID  . Travoprost (BAK Free)  1 drop Left Eye QHS    Labs:  Recent Labs  05/29/13 0446 05/30/13 0530  NA 132* 131*  K 3.8 4.1  CL 92* 91*  CO2 31 29  GLUCOSE 86 94  BUN 13 12  CREATININE 1.08 1.13*  CALCIUM 9.5 9.3  MG 2.2 2.1    Recent Labs  05/28/13 1910  WBC 5.3  HGB 12.3  HCT 36.2  MCV 84.8  PLT 167    Radiology/Studies: No results found.  Telemetry: shows persistent AFib with intermittent V pacing 12-lead ECG: AF with V pacing; QTc 440 msec with  QRS 160 msec using Framingham equation (Bazett formula usually inaccurate in setting of V pacing)    Assessment and Plan  1. Persistent AFib - continue sotalol 120 mg once daily (adjusted for CrCl) - continue daily BMET, Mg and ECG - rate controlled - keep NPO for possible DCCV today - continue Pradaxa 2. Normal LVEF 3. CKD 4. Tachy-brady syndrome, PPM in place 5. HTN 6. Seizure disorder 7. SIADH  Dr. Johney Whitney to see Signed, EDMISTEN, BROOKE PA-C  I have seen, examined the patient, and reviewed the above assessment and plan.  Changes to above are made where necessary.  QT is stable.  She remains in afib.  Will proceed with cardioversion.  Risks, benefits, and alternatives to this procedure were discussed with the patient who wishes to proceed.  Co Sign: Hillis Range, MD 05/30/2013 3:15 PM

## 2013-05-30 NOTE — Progress Notes (Signed)
Pt cardioverted without complication earlier today. Would plan discharge to home tomorrow with close outpatient followup by Dr Swaziland.

## 2013-05-30 NOTE — Anesthesia Preprocedure Evaluation (Addendum)
Anesthesia Evaluation  Patient identified by MRN, date of birth, ID band Patient awake    Reviewed: Allergy & Precautions, H&P , NPO status , Patient's Chart, lab work & pertinent test results, reviewed documented beta blocker date and time   History of Anesthesia Complications Negative for: history of anesthetic complications  Airway Mallampati: II TM Distance: >3 FB Neck ROM: Full    Dental  (+) Dental Advisory Given and Teeth Intact   Pulmonary neg pulmonary ROS,  breath sounds clear to auscultation  Pulmonary exam normal       Cardiovascular hypertension, Pt. on home beta blockers + Peripheral Vascular Disease and +CHF + dysrhythmias Atrial Fibrillation + pacemaker Rhythm:Irregular Rate:Normal  EF 55% with diastolic dysfunction   Neuro/Psych negative neurological ROS     GI/Hepatic hiatal hernia, GERD-  Medicated,  Endo/Other  negative endocrine ROS  Renal/GU negative Renal ROS     Musculoskeletal   Abdominal   Peds  Hematology   Anesthesia Other Findings   Reproductive/Obstetrics                         Anesthesia Physical Anesthesia Plan  ASA: III  Anesthesia Plan: General   Post-op Pain Management:    Induction: Intravenous  Airway Management Planned: Mask and Natural Airway  Additional Equipment:   Intra-op Plan:   Post-operative Plan:   Informed Consent: I have reviewed the patients History and Physical, chart, labs and discussed the procedure including the risks, benefits and alternatives for the proposed anesthesia with the patient or authorized representative who has indicated his/her understanding and acceptance.   Dental advisory given  Plan Discussed with: Anesthesiologist, Surgeon and CRNA  Anesthesia Plan Comments: (Plan routine monitors, GA for cardioversion)       Anesthesia Quick Evaluation

## 2013-05-30 NOTE — Preoperative (Signed)
Beta Blockers   Reason not to administer Beta Blockers:Not Applicable 

## 2013-05-30 NOTE — Op Note (Signed)
EP procedure Note   Pre procedure Diagnosis:  Persistent Atrial fibrillation Post procedure Diagnosis:  Same  Procedures:  Electrical cardioversion  Description:  Informed, written consent was obtained for cardioversion.  Adequate IV acces and airway support were assured.  The patient was adequately sedated with intravenous propofol as outlined in the anesthesia report.  The patient presented today in atrial fibrillation.  She was successfully cardioverted to sinus rhythm with a single synchronized biphasic 200J shock delivered with cardioversion electrodes placed in the anterior/posterior configuration.  She remains in sinus rhythm thereafter.  There were no early apparent complications.  Pacemaker was interrogated and confirmed to be functioning normally (see paper chart).  Reprogrammed with a lower rate of 60 bpm.  Conclusions:  1.  Successful cardioversion of afib to sinus rhythm 2.  No early apparent complications.   Duron Meister,MD 4:04 PM 05/30/2013

## 2013-05-31 DIAGNOSIS — J8410 Pulmonary fibrosis, unspecified: Secondary | ICD-10-CM | POA: Diagnosis not present

## 2013-05-31 DIAGNOSIS — I4891 Unspecified atrial fibrillation: Secondary | ICD-10-CM | POA: Diagnosis not present

## 2013-05-31 DIAGNOSIS — E236 Other disorders of pituitary gland: Secondary | ICD-10-CM | POA: Diagnosis not present

## 2013-05-31 DIAGNOSIS — I5033 Acute on chronic diastolic (congestive) heart failure: Secondary | ICD-10-CM | POA: Diagnosis not present

## 2013-05-31 LAB — BASIC METABOLIC PANEL
BUN: 14 mg/dL (ref 6–23)
CO2: 31 mEq/L (ref 19–32)
Chloride: 92 mEq/L — ABNORMAL LOW (ref 96–112)
Glucose, Bld: 81 mg/dL (ref 70–99)
Potassium: 3.8 mEq/L (ref 3.5–5.1)
Sodium: 132 mEq/L — ABNORMAL LOW (ref 135–145)

## 2013-05-31 MED ORDER — SOTALOL HCL 120 MG PO TABS: 120.0000 mg | ORAL_TABLET | Freq: Every day | ORAL | Status: DC

## 2013-05-31 NOTE — Progress Notes (Signed)
Patient ID: [name], female   DOB: 1924-10-19, 79 y.o.   MRN: [id number]   SUBJECTIVE: The patient had converted spontaneously to regular rhythm. Telemetry shows that she's pacing with normal sinus rhythm. She feels better and is ready to go home.   Filed Vitals:   05/30/13 1421 05/30/13 2047 05/31/13 0523 05/31/13 1014  BP: 137/82 113/66 145/76 143/72  Pulse: 83 63 75 63  Temp: 97.7 F (36.5 C) 98 F (36.7 C) 97.6 F (36.4 C)   TempSrc:  Oral Oral   Resp: 18 18 18    Height:      Weight:      SpO2: 94% 96% 94%     Intake/Output Summary (Last 24 hours) at 05/31/13 1046 Last data filed at 05/30/13 1705  Gross per 24 hour  Intake    100 ml  Output    400 ml  Net   -300 ml    LABS: Basic Metabolic Panel:  Recent Labs  47/82/95 0530 05/31/13 0510  NA 131* 132*  K 4.1 3.8  CL 91* 92*  CO2 29 31  GLUCOSE 94 81  BUN 12 14  CREATININE 1.13* 1.07  CALCIUM 9.3 9.5  MG 2.1 2.2   Liver Function Tests: No results found for this basename: AST, ALT, ALKPHOS, BILITOT, PROT, ALBUMIN,  in the last 72 hours No results found for this basename: LIPASE, AMYLASE,  in the last 72 hours CBC:  Recent Labs  05/28/13 1910  WBC 5.3  HGB 12.3  HCT 36.2  MCV 84.8  PLT 167   Cardiac Enzymes: No results found for this basename: CKTOTAL, CKMB, CKMBINDEX, TROPONINI,  in the last 72 hours BNP: No components found with this basename: POCBNP,  D-Dimer: No results found for this basename: DDIMER,  in the last 72 hours Hemoglobin A1C: No results found for this basename: HGBA1C,  in the last 72 hours Fasting Lipid Panel: No results found for this basename: CHOL, HDL, LDLCALC, TRIG, CHOLHDL, LDLDIRECT,  in the last 72 hours Thyroid Function Tests: No results found for this basename: TSH, T4TOTAL, FREET3, T3FREE, THYROIDAB,  in the last 72 hours  RADIOLOGY: No results found.  PHYSICAL EXAM  patient is oriented to person time and place. Affect is normal. Cardiac exam reveals S1-S2.  There is no peripheral edema.   TELEMETRY:  I have reviewed telemetry today May 31, 2013. There is a paced rhythm. She is atrially paced consistent with sinus rhythm   ASSESSMENT AND PLAN:    Tachycardia-bradycardia      Patient continues to hold sinus rhythm. She's ready for discharge today. I spoke with a family member in the room in addition.   Willa Rough 05/31/2013 10:46 AM

## 2013-05-31 NOTE — Discharge Summary (Signed)
Physician Discharge Summary  Patient ID: [name] MRN: [id number] DOB/AGE: 1925-05-12 79 y.o.  Admit date: 05/28/2013 Discharge date: 05/31/2013  Primary Discharge Diagnosis:  1.Atrial fibrillation-Sotolol Initiation 2. S/P DCCV 05/30/2013  Secondary Discharge Diagnosis 1. Hypertension 2. SIADH 3. PPM  Hospital Course: [name] is a 79 y/o patient of Dr. Johney Frame admitted with chronic atrial fibrillation that is not well tolerated. She has had multiple failed medication attempts to control atrial fibrillation to include Multaq and Tikosyn, but had significant issues with QT prolongation , and significant structural heart disease with PA pressure of 90 with moderate TR and atrial enlargement. Was placed on sotalol 120 mg daily adjusted for creatinine clearance, echocardiogram was reviewed from prior study and found to have a normal LVEF. After sotalol loading, the patient underwent cardioversion by Dr. Johney Frame on 05/30/2013 using a single synchronized biphasic 200 J shock. Pacemaker was interrogated and confirmed to be functioning normally post procedure.     The patient was seen and examined by Dr. Willa Rough on day of discharge and found to be stable. Her telemetry showed pacing with normal sinus rhythm. Symptoms were resolved. She continued on sotalol. Her prescription is provided on discharge. She will followup with Dr. Johney Frame as an outpatient for continued management.      Discharge Exam: Blood pressure 143/72, pulse 63, temperature 97.6 F (36.4 C), temperature source Oral, resp. rate 18, height 5\' 3"  (1.6 m), weight 151 lb (68.493 kg), SpO2 94.00%.  Labs:   Lab Results  Component Value Date   WBC 5.3 05/28/2013   HGB 12.3 05/28/2013   HCT 36.2 05/28/2013   MCV 84.8 05/28/2013   PLT 167 05/28/2013    Recent Labs Lab 05/31/13 0510  NA 132*  K 3.8  CL 92*  CO2 31  BUN 14  CREATININE 1.07  CALCIUM 9.5  GLUCOSE 81     WGN:FAOZH with NSR rate of 63 bpm.   FOLLOW UP PLANS  AND APPOINTMENTS Discharge Orders   Future Appointments Provider Department Dept Phone   08/25/2013 9:00 AM [facility] Main Office [place]) [phone]   Future Orders Complete By Expires   Diet - low sodium heart healthy  As directed    Increase activity slowly  As directed        Medication List         cholecalciferol 1000 UNITS tablet  Commonly known as:  VITAMIN D  Take 1,000 Units by mouth daily.     CITRACAL PO  Take 2 tablets by mouth daily.     dabigatran 75 MG Caps capsule  Commonly known as:  PRADAXA  Take 1 capsule (75 mg total) by mouth every 12 (twelve) hours. Hold PM dose today 03/03/2013. Start 75 mg twice daily tomorrow 03/04/2013..     divalproex 500 MG DR tablet  Commonly known as:  DEPAKOTE  Take 500 mg by mouth at bedtime.     esomeprazole 20 MG capsule  Commonly known as:  NEXIUM  Take 40 mg by mouth daily before breakfast.     furosemide 40 MG tablet  Commonly known as:  LASIX  Take 1 tablet (40 mg total) by mouth daily.     HYDROcodone-acetaminophen 5-500 MG per tablet  Commonly known as:  VICODIN  - Take 1 tablet by mouth every 6 (six) hours as needed. For pain  -      metoprolol tartrate 25 MG tablet  Commonly known as:  LOPRESSOR  Take 1 tablet (  25 mg total) by mouth 2 (two) times daily.     multivitamins ther. w/minerals Tabs tablet  Take 1 tablet by mouth daily.     olmesartan 40 MG tablet  Commonly known as:  BENICAR  Take 1 tablet (40 mg total) by mouth daily.     polyethylene glycol powder powder  Commonly known as:  GLYCOLAX/MIRALAX  Take 8 g by mouth daily as needed. For constipation     potassium chloride 10 MEQ tablet  Commonly known as:  K-DUR,KLOR-CON  Take 1 tablet (10 mEq total) by mouth 2 (two) times daily.     sotalol 120 MG tablet  Commonly known as:  BETAPACE  Take 1 tablet (120 mg total) by mouth daily.     timolol 0.5 % ophthalmic solution  Commonly known as:  BETIMOL   Place 1 drop into both eyes 2 (two) times daily.     tolterodine 4 MG 24 hr capsule  Commonly known as:  DETROL LA  Take 4 mg by mouth daily.     travoprost (benzalkonium) 0.004 % ophthalmic solution  Commonly known as:  TRAVATAN  Place 1 drop into the left eye at bedtime.           Follow-up Information   Follow up with Hillis Range, MD. (Our office will call you for follow up appt.)    Specialty:  Cardiology   Contact information:   [address] [place] [address] [phone]       Time spent with patient to include physician time:35 minutes   Signed: Joni Reining 05/31/2013, 11:07 AM Co-Sign MD Patient seen and examined. I agree with the assessment and plan as detailed above. See also my additional thoughts below.   To my progress note from today. I made the decision for the patient to be discharged.  Willa Rough, MD, [facility] 06/01/2013 6:59 AM

## 2013-06-02 ENCOUNTER — Ambulatory Visit: Payer: Medicare Other | Admitting: Cardiology

## 2013-06-02 ENCOUNTER — Encounter: Payer: Self-pay | Admitting: Internal Medicine

## 2013-06-03 ENCOUNTER — Encounter (HOSPITAL_COMMUNITY): Payer: Self-pay | Admitting: Internal Medicine

## 2013-06-03 DIAGNOSIS — N3941 Urge incontinence: Secondary | ICD-10-CM | POA: Diagnosis not present

## 2013-06-03 DIAGNOSIS — R350 Frequency of micturition: Secondary | ICD-10-CM | POA: Diagnosis not present

## 2013-06-16 ENCOUNTER — Encounter: Payer: Self-pay | Admitting: Internal Medicine

## 2013-06-16 ENCOUNTER — Ambulatory Visit (INDEPENDENT_AMBULATORY_CARE_PROVIDER_SITE_OTHER): Payer: Medicare Other | Admitting: Internal Medicine

## 2013-06-16 VITALS — BP 137/89 | HR 82 | Ht 63.0 in | Wt 147.0 lb

## 2013-06-16 DIAGNOSIS — I4891 Unspecified atrial fibrillation: Secondary | ICD-10-CM

## 2013-06-16 DIAGNOSIS — I10 Essential (primary) hypertension: Secondary | ICD-10-CM | POA: Diagnosis not present

## 2013-06-16 DIAGNOSIS — I5033 Acute on chronic diastolic (congestive) heart failure: Secondary | ICD-10-CM

## 2013-06-16 DIAGNOSIS — I495 Sick sinus syndrome: Secondary | ICD-10-CM

## 2013-06-16 LAB — PACEMAKER DEVICE OBSERVATION
ATRIAL PACING PM: 67
DEVICE MODEL PM: 7280874
RV LEAD IMPEDENCE PM: 625 Ohm
VENTRICULAR PACING PM: 11

## 2013-06-16 NOTE — Progress Notes (Signed)
PCP:  [name], MD Primary Cardiologist:  Dr Swaziland  The patient presents today for electrophysiology followup.  Since her recent hospital discharge, she has returned to afib.  She does not tolerate afib.  She reports significant SOB and decreased exercise tolerance.  Today, she denies symptoms of palpitations, chest pain,  dizziness, presyncope, syncope, or neurologic sequela.  Her edema is stable.  The patient feels that she is tolerating medications without difficulties and is otherwise without complaint today.   Past Medical History  Diagnosis Date  . Pneumonia 04/2010  . Hypertension   . Renal artery stenosis 2008    STATUS POST ANGIOPLASTY  . GERD (gastroesophageal reflux disease)   . Back pain     CHRONIC  . Atrial fibrillation     Prior TEE/CV in May 2012; managed with rate control and anticoagulation  . Hyponatremia     improved  . Seizure disorder     on anti-epileptic therapy  . Urinary incontinence   . Osteopenia   . SIADH (syndrome of inappropriate ADH production)     h/o HYPONATREMIA ; LIKELY RELATED TO HER LUNG PROCESS  . Arthritis   . Hiatal hernia   . Tachy-brady syndrome     with pauses noted in May 2012; s/p PTVP October 2012  . Dysphagia   . (HFpEF) heart failure with preserved ejection fraction   . Pacemaker    Past Surgical History  Procedure Laterality Date  . Cholecystectomy    . Back surgery      X2  . Total knee arthroplasty      RIGHT AND LEFT KNEE  . Tonsillectomy    . Appendectomy    . Cardiac catheterization  2007    Normal  . Knee arthroscopy      LEFT KNEE  . Cataract extraction    . Shoulder surgery    . Renal angioplasty  01/2007    RIGHT  . Partial hysterectomy    . Pacemaker insertion  06/2011    SJM Acc[employer] DR RF implanted by Dr Johney Frame  . Eye surgery      cataracts removed on both eyes  . Cardioversion    . Cardioversion  10/14/2012    Procedure: CARDIOVERSION;  Surgeon: Vesta Mixer, MD;  Location: [facility] ENDOSCOPY;   Service: Cardiovascular;  Laterality: N/A;  . Cardioversion N/A 05/30/2013    Procedure: CARDIOVERSION;  Surgeon: Hillis Range, MD;  Location[facility];  Service: Cardiovascular;  Laterality: N/A;    Current Outpatient Prescriptions  Medication Sig Dispense Refill  . Calcium Citrate (CITRACAL PO) Take 2 tablets by mouth daily.       . cholecalciferol (VITAMIN D) 1000 UNITS tablet Take 1,000 Units by mouth daily.        . dabigatran (PRADAXA) 75 MG CAPS Take 1 capsule (75 mg total) by mouth every 12 (twelve) hours. Hold PM dose today 03/03/2013. Start 75 mg twice daily tomorrow 03/04/2013..  180 capsule  3  . divalproex (DEPAKOTE) 500 MG EC tablet Take 500 mg by mouth at bedtime.        Marland Kitchen esomeprazole (NEXIUM) 20 MG capsule Take 40 mg by mouth daily before breakfast.      . furosemide (LASIX) 40 MG tablet Take 1 tablet (40 mg total) by mouth daily.  90 tablet  4  . HYDROcodone-acetaminophen (VICODIN) 5-500 MG per tablet Take 1 tablet by mouth every 6 (six) hours as needed. For pain       . metoprolol  tartrate (LOPRESSOR) 25 MG tablet Take 1 tablet (25 mg total) by mouth 2 (two) times daily.  180 tablet  4  . Multiple Vitamins-Minerals (MULTIVITAMINS THER. W/MINERALS) TABS Take 1 tablet by mouth daily.        Marland Kitchen olmesartan (BENICAR) 40 MG tablet Take 1 tablet (40 mg total) by mouth daily.  90 tablet  4  . polyethylene glycol powder (GLYCOLAX/MIRALAX) powder Take 8 g by mouth daily as needed. For constipation      . potassium chloride (K-DUR,KLOR-CON) 10 MEQ tablet Take 1 tablet (10 mEq total) by mouth 2 (two) times daily.  180 tablet  4  . sotalol (BETAPACE) 120 MG tablet Take 1 tablet (120 mg total) by mouth daily.  30 tablet  6  . timolol (BETIMOL) 0.5 % ophthalmic solution Place 1 drop into both eyes 2 (two) times daily.       Marland Kitchen tolterodine (DETROL LA) 4 MG 24 hr capsule Take 4 mg by mouth daily.      . travoprost, benzalkonium, (TRAVATAN) 0.004 % ophthalmic solution Place 1 drop into the left eye  at bedtime.        No current facility-administered medications for this visit.    No Known Allergies  History   Social History  . Marital Status: Married    Spouse Name: N/A    Number of Children: 3  . Years of Education: 10   Occupational History  . [name]    [employer]   Social History Main Topics  . Smoking status: Never Smoker   . Smokeless tobacco: Never Used  . Alcohol Use: No  . Drug Use: No  . Sexual Activity: No   Other Topics Concern  . 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 ABLE TO AMBULATE WITHIN HOME, UP STAIRS WITHOUT INCIDENT AT BASELINE.                 Family History  Problem Relation Age of Onset  . Stroke Mother   . Diabetes Mother   . Heart disease Mother     Unkown  . Stroke Father   . Diabetes Father   . Cancer Father     PROSTATE  . Cancer Brother     RENAL AND BLADDER  . Lung disease Brother   . Lung disease Sister   . Hypertension Daughter   . Hypertension Son     ROS-  All systems are reviewed and are negative except as outlined in the HPI above  Physical Exam: Filed Vitals:   06/16/13 1400  BP: 137/89  Pulse: 82  Height: 5\' 3"  (1.6 m)  Weight: 147 lb (66.679 kg)    GEN- The patient is elderly appearing, alert and oriented x 3 today.   Head- normocephalic, atraumatic Eyes-  Sclera clear, conjunctiva pink Ears- hearing intact Oropharynx- clear Neck- supple, Lungs- diffuse dry rales, normal work of breathing Heart- irregular rate and rhythm,  GI- soft, NT, ND, + BS Extremities- no clubbing, cyanosis, or edema MS- diffuse atrophy Skin- no rash or lesion Psych- euthymic mood, full affect Neuro- strength and sensation are intact  ekg 8/14today reveals afib, V rate 82 bpm, Qtc  440 Echo is reviewed,  EF is preserved, moderate TR, PA pressure is 90, moderate atrial enlargement  Assessment and Plan:  1. Persistent atrial fibrillation The patient does not well tolerate afib.  Unfortunately, she has significant structural heart disease with PA pressure 90, moderate TR, and atrial enlargement.  I think that our ability to maintain sinus rhythm long term is limited.  She has not tolerated amiodarone due to lung toxicity.  Multaq has become ineffective and now she is back in afib on sotalol 120mg  daily.  She is not a candidate for ablation due to her advanced age and structural heart disease. I think that at this point, Joice Lofts may be our best option.  I will therefore stop sotalol today.  After 2 day washout, she will be admitted for initiation of tikosyn.  We probably need to be a little aggressive with our tikosyn dosing as she has no other options and is nearing a palliative situation with her highly symptomatic afib.  As her pacemaker suggests adequate rate control during afib, I do not feel that an AV nodal ablation would offer her any benefit. Continue pradaxa for stroke prevention.  2. Tachy/brady Normal pacemaker function See Pace Art report No changes today  3. Acute on chronic diastolic dysfunction Will pursue sinus rhythm as above   4. HTN Stable No change required today

## 2013-06-16 NOTE — Patient Instructions (Addendum)
Your physician recommends that you schedule a follow-up appointment in: 5 weeks with Dr Johney Frame     Your physician recommends that you schedule a follow-up appointment on Thurs morning at 9:00am  Come prepared to go to the hospital for 3 nights   Your physician has recommended you make the following change in your medication:  1) Stop Sotalol

## 2013-06-18 ENCOUNTER — Encounter: Payer: Self-pay | Admitting: Internal Medicine

## 2013-06-18 DIAGNOSIS — D0460 Carcinoma in situ of skin of unspecified upper limb, including shoulder: Secondary | ICD-10-CM | POA: Diagnosis not present

## 2013-06-18 DIAGNOSIS — Z85828 Personal history of other malignant neoplasm of skin: Secondary | ICD-10-CM | POA: Diagnosis not present

## 2013-06-18 DIAGNOSIS — L821 Other seborrheic keratosis: Secondary | ICD-10-CM | POA: Diagnosis not present

## 2013-06-19 ENCOUNTER — Encounter: Payer: Medicare Other | Admitting: Pharmacist

## 2013-06-19 ENCOUNTER — Ambulatory Visit (INDEPENDENT_AMBULATORY_CARE_PROVIDER_SITE_OTHER): Payer: Medicare Other | Admitting: Pharmacist

## 2013-06-19 VITALS — BP 122/72 | Ht 63.0 in | Wt 149.0 lb

## 2013-06-19 DIAGNOSIS — I4891 Unspecified atrial fibrillation: Secondary | ICD-10-CM | POA: Diagnosis not present

## 2013-06-19 DIAGNOSIS — I701 Atherosclerosis of renal artery: Secondary | ICD-10-CM

## 2013-06-19 LAB — BASIC METABOLIC PANEL
BUN: 15 mg/dL (ref 6–23)
Chloride: 91 mEq/L — ABNORMAL LOW (ref 96–112)
GFR: 51.14 mL/min — ABNORMAL LOW (ref >60.00)
Glucose, Bld: 114 mg/dL — ABNORMAL HIGH (ref 70–99)
Potassium: 3.7 mEq/L (ref 3.5–5.1)
Sodium: 128 mEq/L — ABNORMAL LOW (ref 135–145)

## 2013-06-19 LAB — MAGNESIUM: Magnesium: 1.9 mg/dL (ref 1.5–2.5)

## 2013-06-20 ENCOUNTER — Ambulatory Visit (INDEPENDENT_AMBULATORY_CARE_PROVIDER_SITE_OTHER): Payer: Medicare Other | Admitting: Internal Medicine

## 2013-06-20 ENCOUNTER — Inpatient Hospital Stay (HOSPITAL_COMMUNITY)
Admission: AD | Admit: 2013-06-20 | Discharge: 2013-06-23 | DRG: 309 | Disposition: A | Discharge status: Home / Self Care | Payer: Medicare Other | Source: Ambulatory Visit | Attending: Internal Medicine | Admitting: Internal Medicine

## 2013-06-20 ENCOUNTER — Inpatient Hospital Stay (HOSPITAL_COMMUNITY): Payer: Medicare Other

## 2013-06-20 ENCOUNTER — Encounter (HOSPITAL_COMMUNITY): Payer: Self-pay | Admitting: Emergency Medicine

## 2013-06-20 ENCOUNTER — Encounter: Payer: Self-pay | Admitting: Internal Medicine

## 2013-06-20 DIAGNOSIS — I5032 Chronic diastolic (congestive) heart failure: Secondary | ICD-10-CM | POA: Diagnosis present

## 2013-06-20 DIAGNOSIS — Z950 Presence of cardiac pacemaker: Secondary | ICD-10-CM | POA: Diagnosis present

## 2013-06-20 DIAGNOSIS — I4891 Unspecified atrial fibrillation: Principal | ICD-10-CM | POA: Diagnosis present

## 2013-06-20 DIAGNOSIS — E871 Hypo-osmolality and hyponatremia: Secondary | ICD-10-CM | POA: Diagnosis present

## 2013-06-20 DIAGNOSIS — Z7901 Long term (current) use of anticoagulants: Secondary | ICD-10-CM | POA: Diagnosis not present

## 2013-06-20 DIAGNOSIS — I509 Heart failure, unspecified: Secondary | ICD-10-CM

## 2013-06-20 DIAGNOSIS — I10 Essential (primary) hypertension: Secondary | ICD-10-CM | POA: Diagnosis not present

## 2013-06-20 DIAGNOSIS — R0602 Shortness of breath: Secondary | ICD-10-CM | POA: Diagnosis not present

## 2013-06-20 DIAGNOSIS — I495 Sick sinus syndrome: Secondary | ICD-10-CM

## 2013-06-20 DIAGNOSIS — I701 Atherosclerosis of renal artery: Secondary | ICD-10-CM | POA: Diagnosis not present

## 2013-06-20 LAB — BASIC METABOLIC PANEL
BUN: 14 mg/dL (ref 6–23)
CO2: 27 mEq/L (ref 19–32)
Calcium: 9.5 mg/dL (ref 8.4–10.5)
Chloride: 94 mEq/L — ABNORMAL LOW (ref 96–112)
Creatinine, Ser: 1.1 mg/dL (ref 0.4–1.2)
GFR: 51.14 mL/min — ABNORMAL LOW (ref >60.00)
Glucose, Bld: 120 mg/dL — ABNORMAL HIGH (ref 70–99)
Potassium: 4.5 mEq/L (ref 3.5–5.1)
Sodium: 130 mEq/L — ABNORMAL LOW (ref 135–145)

## 2013-06-20 LAB — MAGNESIUM: Magnesium: 1.9 mg/dL (ref 1.5–2.5)

## 2013-06-20 MED ORDER — POLYETHYLENE GLYCOL 3350 17 GM/SCOOP PO POWD
17.0000 g | Freq: Every day | ORAL | Status: DC | PRN
  Filled 2013-06-20: qty 255

## 2013-06-20 MED ORDER — TRAVOPROST 0.004 % OP SOLN: 1.0000 [drp] | Freq: Every day | OPHTHALMIC | Status: DC

## 2013-06-20 MED ORDER — SODIUM CHLORIDE 0.9 % IJ SOLN
3.0000 mL | Freq: Two times a day (BID) | INTRAMUSCULAR | Status: DC
  Administered 2013-06-20 – 2013-06-23 (×6): 3 mL via INTRAVENOUS

## 2013-06-20 MED ORDER — TIMOLOL MALEATE 0.5 % OP SOLN
1.0000 [drp] | Freq: Every day | OPHTHALMIC | Status: DC
  Administered 2013-06-20 – 2013-06-23 (×4): 1 [drp] via OPHTHALMIC
  Filled 2013-06-20: qty 5

## 2013-06-20 MED ORDER — ADULT MULTIVITAMIN W/MINERALS CH
1.0000 | ORAL_TABLET | Freq: Every day | ORAL | Status: DC
  Administered 2013-06-20 – 2013-06-23 (×4): 1 via ORAL
  Filled 2013-06-20 (×4): qty 1

## 2013-06-20 MED ORDER — DOFETILIDE 125 MCG PO CAPS
125.0000 ug | ORAL_CAPSULE | Freq: Two times a day (BID) | ORAL | Status: DC
  Administered 2013-06-20 – 2013-06-21 (×3): 125 ug via ORAL
  Filled 2013-06-20 (×5): qty 1

## 2013-06-20 MED ORDER — DABIGATRAN ETEXILATE MESYLATE 75 MG PO CAPS
75.0000 mg | ORAL_CAPSULE | Freq: Two times a day (BID) | ORAL | Status: DC
  Administered 2013-06-20 – 2013-06-23 (×6): 75 mg via ORAL
  Filled 2013-06-20 (×7): qty 1

## 2013-06-20 MED ORDER — POTASSIUM CHLORIDE CRYS ER 10 MEQ PO TBCR
10.0000 meq | EXTENDED_RELEASE_TABLET | Freq: Two times a day (BID) | ORAL | Status: DC
  Administered 2013-06-20: 10 meq via ORAL
  Filled 2013-06-20 (×3): qty 1

## 2013-06-20 MED ORDER — LATANOPROST 0.005 % OP SOLN
1.0000 [drp] | Freq: Every day | OPHTHALMIC | Status: DC
  Administered 2013-06-20 – 2013-06-22 (×3): 1 [drp] via OPHTHALMIC
  Filled 2013-06-20: qty 2.5

## 2013-06-20 MED ORDER — FESOTERODINE FUMARATE ER 4 MG PO TB24
4.0000 mg | ORAL_TABLET | Freq: Every day | ORAL | Status: DC
  Administered 2013-06-20 – 2013-06-23 (×4): 4 mg via ORAL
  Filled 2013-06-20 (×4): qty 1

## 2013-06-20 MED ORDER — METOPROLOL TARTRATE 25 MG PO TABS
25.0000 mg | ORAL_TABLET | Freq: Two times a day (BID) | ORAL | Status: DC
  Administered 2013-06-20 – 2013-06-23 (×6): 25 mg via ORAL
  Filled 2013-06-20 (×7): qty 1

## 2013-06-20 MED ORDER — DIVALPROEX SODIUM 500 MG PO DR TAB
500.0000 mg | DELAYED_RELEASE_TABLET | Freq: Every day | ORAL | Status: DC
  Administered 2013-06-20 – 2013-06-22 (×3): 500 mg via ORAL
  Filled 2013-06-20 (×4): qty 1

## 2013-06-20 MED ORDER — THERA M PLUS PO TABS
1.0000 | ORAL_TABLET | Freq: Every day | ORAL | Status: DC
Start: 2013-06-20 — End: 2013-06-20

## 2013-06-20 MED ORDER — SODIUM CHLORIDE 0.9 % IJ SOLN: 3.0000 mL | INTRAMUSCULAR | Status: DC | PRN

## 2013-06-20 MED ORDER — TIMOLOL HEMIHYDRATE 0.5 % OP SOLN: 1.0000 [drp] | Freq: Every day | OPHTHALMIC | Status: DC

## 2013-06-20 MED ORDER — IRBESARTAN 300 MG PO TABS
300.0000 mg | ORAL_TABLET | Freq: Every day | ORAL | Status: DC
  Administered 2013-06-21 – 2013-06-23 (×3): 300 mg via ORAL
  Filled 2013-06-20 (×3): qty 1

## 2013-06-20 MED ORDER — VITAMIN D3 25 MCG (1000 UNIT) PO TABS
1000.0000 [IU] | ORAL_TABLET | Freq: Every day | ORAL | Status: DC
  Administered 2013-06-21 – 2013-06-23 (×3): 1000 [IU] via ORAL
  Filled 2013-06-20 (×3): qty 1

## 2013-06-20 MED ORDER — HYDROCODONE-ACETAMINOPHEN 5-325 MG PO TABS: 1.0000 | ORAL_TABLET | Freq: Four times a day (QID) | ORAL | Status: DC | PRN

## 2013-06-20 MED ORDER — PANTOPRAZOLE SODIUM 40 MG PO TBEC
80.0000 mg | DELAYED_RELEASE_TABLET | Freq: Every day | ORAL | Status: DC
  Administered 2013-06-21 – 2013-06-23 (×3): 80 mg via ORAL
  Filled 2013-06-20 (×4): qty 2

## 2013-06-20 MED ORDER — SODIUM CHLORIDE 0.9 % IV SOLN: 250.0000 mL | INTRAVENOUS | Status: DC | PRN

## 2013-06-20 MED ORDER — FUROSEMIDE 40 MG PO TABS
40.0000 mg | ORAL_TABLET | Freq: Every day | ORAL | Status: DC
  Administered 2013-06-21 – 2013-06-23 (×3): 40 mg via ORAL
  Filled 2013-06-20 (×5): qty 1

## 2013-06-20 NOTE — Progress Notes (Signed)
   The patient presents today for Tikosyn intitiaton.   She was seen by Dr. Johney Frame on 9/29. Pt has multiple cardiac issues including CHF, tachy brady with afib and pacemaker.  She was placed on amiodarone in the distance pass but this was stopped due to respiratory issues.  She was cardioverted in January 2014.  Multaq was started in June but she reverted back to afib so this was changed to sotalol at the beginning of September.  At her follow up visit on 9/29, she was once again in afib and symptomatic so decision was made to stop sotalol and try Tikosyn.  Pt took her last dose of Tikosyn on Sunday 9/28.  Pt has been complaint with Pradaxa.  Her dose was reduced in conjuction with Multaq but based on most recent CrCl- should be increased to 150mg  BID.    EKG reviewed by Dr. Ladona Ridgel- paced; atrial fibrillation with vent rate of 74.  QTc- 435.   Discussed potential side effects with pt and husband.  They are aware of the importance of compliance.  Reviewed her other medications.  No QTc prolongating or contraindicated medications noted.   Current Outpatient Prescriptions  Medication Sig Dispense Refill  . Calcium Citrate (CITRACAL PO) Take 1 tablet by mouth 2 (two) times daily.       . cholecalciferol (VITAMIN D) 1000 UNITS tablet Take 1,000 Units by mouth daily.        . dabigatran (PRADAXA) 75 MG CAPS Take 1 capsule (75 mg total) by mouth every 12 (twelve) hours. Hold PM dose today 03/03/2013. Start 75 mg twice daily tomorrow 03/04/2013..  180 capsule  3  . divalproex (DEPAKOTE) 500 MG EC tablet Take 500 mg by mouth at bedtime.        Marland Kitchen esomeprazole (NEXIUM) 20 MG capsule Take 40 mg by mouth daily before breakfast.      . furosemide (LASIX) 40 MG tablet Take 1 tablet (40 mg total) by mouth daily.  90 tablet  4  . HYDROcodone-acetaminophen (VICODIN) 5-500 MG per tablet Take 1 tablet by mouth every 6 (six) hours as needed. For pain       . metoprolol tartrate (LOPRESSOR) 25 MG tablet Take 1 tablet (25  mg total) by mouth 2 (two) times daily.  180 tablet  4  . Multiple Vitamins-Minerals (MULTIVITAMINS THER. W/MINERALS) TABS Take 1 tablet by mouth daily.        Marland Kitchen olmesartan (BENICAR) 40 MG tablet Take 1 tablet (40 mg total) by mouth daily.  90 tablet  4  . polyethylene glycol powder (GLYCOLAX/MIRALAX) powder Take 8 g by mouth daily as needed. For constipation      . potassium chloride (K-DUR,KLOR-CON) 10 MEQ tablet Take 1 tablet (10 mEq total) by mouth 2 (two) times daily.  180 tablet  4  . timolol (BETIMOL) 0.5 % ophthalmic solution Place 1 drop into both eyes 2 (two) times daily.       Marland Kitchen tolterodine (DETROL LA) 4 MG 24 hr capsule Take 4 mg by mouth daily.      . travoprost, benzalkonium, (TRAVATAN) 0.004 % ophthalmic solution Place 1 drop into the left eye at bedtime.        No current facility-administered medications for this visit.    No Known Allergies

## 2013-06-20 NOTE — Assessment & Plan Note (Signed)
Reviewed pt's labs.  K low at 3.7.  Will have pt take an extra on 10/2 then recheck labs on 10/3.   CrCl- 50mL/min.  Will likely need Tikosyn BID.  Pradaxa dose should also be increased to 150mg  BID.   10/3-  Labs drawn but unavailable for review.  Given pt's symptoms while in afib, will go ahead and admit and supplement electrolyes as needed.

## 2013-06-20 NOTE — Progress Notes (Signed)
Pt arrived on 2W from MD office. Pt in Afib, rates controlled from 70's-90's. Pt is SOB, however, sats 96% on room air. Pt reports falling yesterday at home and doesn't remember why. Will place pt on bed alarm and told pt to please call if she needs to get up. Notified NP that pt has arrived and she will place admission orders.

## 2013-06-20 NOTE — Progress Notes (Signed)
Pharmacy Consult for Dofetilide (Tikosyn) Iniation  Admit Complaint: 79 y.o. female admitted 06/20/2013 with atrial fibrillation to be initiated on dofetilide.   Assessment:  Patient Exclusion Criteria: If any screening criteria checked as "Yes", then  patient  should NOT receive dofetilide until criteria item is corrected. If "Yes" please indicate correction plan.  YES  NO Patient  Exclusion Criteria Correction Plan  []  [x]  Baseline QTc interval is greater than or equal to 440 msec. IF above YES box checked dofetilide contraindicated unless patient has ICD; then may proceed if QTc 500-550 msec or with known ventricular conduction abnormalities may proceed with QTc 550-600 msec. QTc =     []  [x]  Magnesium level is less than 1.8 mEq/l : Last magnesium:  Lab Results  Component Value Date   MG 1.9 06/20/2013         []  [x]  Potassium level is less than 4 mEq/l : Last potassium:  Lab Results  Component Value Date   K 4.5 06/20/2013         []  [x]  Patient is known or suspected to have a digoxin level greater than 2 ng/ml: No results found for this basename: DIGOXIN      []  [x]  Creatinine clearance less than 20 ml/min (calculated using Cockcroft-Gault, actual body weight and serum creatinine): Estimated Creatinine Clearance: 34.5 ml/min (by C-G formula based on Cr of 1.1).    []  [x]  Patient has received drugs known to prolong the QT intervals within the last 48 hours(phenothiazines, tricyclics or tetracyclic antidepressants, erythromycin, H-1 antihistamines, cisapride, fluoroquinolones, azithromycin). Drugs not listed above may have an, as yet, undetected potential to prolong the QT interval, updated information on QT prolonging agents is available at this website:QT prolonging agents   []  [x]  Patient received a dose of hydrochlorothiazide (Oretic) alone or in any combination including triamterene (Dyazide, Maxzide) in the last 48 hours.   []  [x]  Patient received a medication known to  increase dofetilide plasma concentrations prior to initial dofetilide dose:    Trimethoprim (Primsol, Proloprim) in the last 36 hours   Verapamil (Calan, Verelan) in the last 36 hours or a sustained release dose in the last 72 hours   Megestrol (Megace) in the last 5 days    Cimetidine (Tagamet) in the last 6 hours   Ketoconazole (Nizoral) in the last 24 hours   Itraconazole (Sporanox) in the last 48 hours    Prochlorperazine (Compazine) in the last 36 hours    []  [x]  Patient is known to have a history of torsades de pointes; congenital or acquired long QT syndromes.   []  [x]  Patient has received a Class 1 antiarrhythmic with less than 2 half-lives since last dose. (Disopyramide, Quinidine, Procainamide, Lidocaine, Mexiletine, Flecainide, Propafenone)   []  [x]  Patient has received amiodarone therapy in the past 3 months or amiodarone level is greater than 0.3 ng/ml.    Patient has been appropriately anticoagulated with dabigatran 75mg  BID.  Ordering provider was confirmed at [email or web address] if they are not listed on the [facility] Authorized Prescribers list.  Goal of Therapy:  Follow renal function, electrolytes, potential drug interactions, and dose adjustment. Provide education and 1 week supply at discharge.  Plan:  1.  Initiate dofetilide based on renal function: Select One Calculated CrCl  Dose q12h  []  > 60 ml/min 500 mcg  []  40-60 ml/min 250 mcg  [x]  20-40 ml/min 125 mcg   2. Follow up QTc after the first 5 doses, renal function, electrolytes (K & Mg) daily x 3  days, dose adjustment, success of initiation and facilitate 1 week discharge supply as     clinically indicated.  3. Initiate Tikosyn education video (Call [phone] and ask for video # [phone]).  4. Place Enrollment Form on the chart for discharge supply of dofetilide.   Marcelino Scot 4:50 PM 06/20/2013

## 2013-06-20 NOTE — Progress Notes (Signed)
Changed pt's dressing on left arm per request. Pt had mole removed 3 days ago at MD office. Pt was informed to change in 3 days and place vaseline and gauze over site. Followed these orders and wrapped in kerlex. Site is clean and intact, old scab over incision.

## 2013-06-20 NOTE — Progress Notes (Addendum)
Patient Name: [name] Date of Encounter: 06/20/2013  Principal Problem:   Atrial fibrillation Active Problems:   Diastolic CHF, chronic   Hypertension   Anticoagulant long-term use   Hyponatremia   Pacemaker-St.Jude    SUBJECTIVE: Some DOE, LE edema. She has held Lasix last 2 days.  OBJECTIVE Filed Vitals:   06/20/13 1515 06/20/13 1519  BP: 124/88   Pulse: 82   Temp: 97.8 F (36.6 C)   TempSrc: Oral   Resp: 18   Height:  5\' 3"  (1.6 m)  Weight:  149 lb 4 oz (67.7 kg)  SpO2: 96%    No intake or output data in the 24 hours ending 06/20/13 1705 Filed Weights   06/20/13 1519  Weight: 149 lb 4 oz (67.7 kg)    PHYSICAL EXAM General: Well developed, well nourished, female in no acute distress. Head: Normocephalic, atraumatic.  Neck: Supple without bruits, JVD at 8 cm. Lungs:  Resp regular and unlabored, rales bases. Heart: Irregular, S1, S2, no S3, S4, or murmur; no rub. Abdomen: Soft, non-tender, non-distended, BS + x 4.  Extremities: No clubbing, cyanosis, 1-2+ pedal edema.  Neuro: Alert and oriented X 3. Moves all extremities spontaneously. Psych: Normal affect.  LABS: Basic Metabolic Panel:  Recent Labs  16/10/96 0921 06/20/13 0839  NA 128* 130*  K 3.7 4.5  CL 91* 94*  CO2 30 27  GLUCOSE 114* 120*  BUN 15 14  CREATININE 1.1 1.1  CALCIUM 9.2 9.5  MG 1.9 1.9   TELE: atrial fib, controlled response        ECG: 06/20/2013 Atrial fib Vent. rate 74 BPM PR interval * ms QRS duration 78 ms QT/QTc 370/410 ms P-R-T axes * 30 -1  Radiology/Studies: pending   Current Medications:  . [START ON 06/21/2013] cholecalciferol  1,000 Units Oral Daily  . dabigatran  75 mg Oral Q12H  . divalproex  500 mg Oral QHS  . dofetilide  125 mcg Oral Q12H  . fesoterodine  4 mg Oral Daily  . furosemide  40 mg Oral Daily  . [START ON 06/21/2013] irbesartan  300 mg Oral Daily  . latanoprost  1 drop Left Eye QHS  . metoprolol tartrate  25 mg Oral BID  .  multivitamin with minerals  1 tablet Oral Daily  . [START ON 06/21/2013] pantoprazole  80 mg Oral Q1200  . potassium chloride  10 mEq Oral BID  . sodium chloride  3 mL Intravenous Q12H  . timolol  1 drop Both Eyes Daily      ASSESSMENT AND PLAN: Principal Problem:   Atrial fibrillation - Tikosyn to start today, labs ok, CrCl 39.84 so dose 125 mcg q 12 hr. Follow labs and ECG. MD advise on scheduling DCCV Monday if she does not convert.   Active Problems:   Diastolic CHF, chronic - pt a little SOB with exertion, will ck CXR, resume Lasix in am, may need additional K+ supp.    Hypertension - BP OK now, follow    Anticoagulant long-term use - continue Pradaxa    Hyponatremia - at baseline, follow    Pacemaker-St.Jude - functioning well at last check.  Otherwise, no acute issues, continue home Rx.  Signed, Theodore Demark , PA-C 5:05 PM 06/20/2013    Pt here for Tikosyn loading.  Labs reviewed, pt  K+ 4.5 Mg 1.9 QTc .410 OK to start Tikosyn  Patient seen and examined.  Agree with findings of R Barrett.   Plan for  initiation of Tikosyn today.  Continue other meds  Dietrich Pates 06/20/2013

## 2013-06-21 DIAGNOSIS — I4891 Unspecified atrial fibrillation: Secondary | ICD-10-CM | POA: Diagnosis not present

## 2013-06-21 DIAGNOSIS — I509 Heart failure, unspecified: Secondary | ICD-10-CM | POA: Diagnosis not present

## 2013-06-21 DIAGNOSIS — E871 Hypo-osmolality and hyponatremia: Secondary | ICD-10-CM | POA: Diagnosis not present

## 2013-06-21 DIAGNOSIS — I5032 Chronic diastolic (congestive) heart failure: Secondary | ICD-10-CM | POA: Diagnosis not present

## 2013-06-21 LAB — BASIC METABOLIC PANEL
BUN: 14 mg/dL (ref 6–23)
CO2: 26 mEq/L (ref 19–32)
Chloride: 93 mEq/L — ABNORMAL LOW (ref 96–112)
Creatinine, Ser: 0.88 mg/dL (ref 0.50–1.10)
GFR calc non Af Amer: 58 mL/min — ABNORMAL LOW (ref >90)
Sodium: 131 mEq/L — ABNORMAL LOW (ref 135–145)

## 2013-06-21 MED ORDER — POTASSIUM CHLORIDE CRYS ER 10 MEQ PO TBCR
10.0000 meq | EXTENDED_RELEASE_TABLET | Freq: Every day | ORAL | Status: DC
  Administered 2013-06-22 – 2013-06-23 (×2): 10 meq via ORAL
  Filled 2013-06-21 (×2): qty 1

## 2013-06-21 NOTE — Progress Notes (Signed)
     Patient Name: [name] Date of Encounter: 06/21/2013  Principal Problem:   Atrial fibrillation Active Problems:   Hypertension   Diastolic CHF, chronic   Anticoagulant long-term use   Hyponatremia   Pacemaker-St.Jude    SUBJECTIVE: Patient complains of DOE; no chest pain or palpitations.  OBJECTIVE Filed Vitals:   06/20/13 1515 06/20/13 1519 06/20/13 2016 06/21/13 0419  BP: 124/88  154/77 142/95  Pulse: 82  67 66  Temp: 97.8 F (36.6 C)  98.1 F (36.7 C) 98.6 F (37 C)  TempSrc: Oral  Oral Oral  Resp: 18  18   Height:  5\' 3"  (1.6 m)    Weight:  149 lb 4 oz (67.7 kg)  148 lb 8 oz (67.359 kg)  SpO2: 96%  94% 6%    Intake/Output Summary (Last 24 hours) at 06/21/13 1030 Last data filed at 06/21/13 0748  Gross per 24 hour  Intake    240 ml  Output    100 ml  Net    140 ml   Filed Weights   06/20/13 1519 06/21/13 0419  Weight: 149 lb 4 oz (67.7 kg) 148 lb 8 oz (67.359 kg)    PHYSICAL EXAM General: Well developed, well nourished, female in no acute distress. Head: Normal  Neck: Supple Lungs:  Minimal basilar crackles Heart: Irregular Abdomen: Soft, non-tender, non-distended Extremities: Trace pedal edema.  Neuro: Alert and oriented X 3. Moves all extremities spontaneously. Psych: Normal affect.  LABS: Basic Metabolic Panel:  Recent Labs  09/81/19 0839 06/21/13 0652  NA 130* 131*  K 4.5 4.9  CL 94* 93*  CO2 27 26  GLUCOSE 120* 84  BUN 14 14  CREATININE 1.1 0.88  CALCIUM 9.5 9.3  MG 1.9 2.0   TELE: atrial fib, controlled response        ECG: 06/20/2013 Atrial fib Vent. rate 74 BPM PR interval * ms QRS duration 78 ms QT/QTc 370/410 ms P-R-T axes * 30 -1 QT 0.32    Current Medications:  . cholecalciferol  1,000 Units Oral Daily  . dabigatran  75 mg Oral Q12H  . divalproex  500 mg Oral QHS  . dofetilide  125 mcg Oral Q12H  . fesoterodine  4 mg Oral Daily  . furosemide  40 mg Oral Daily  . irbesartan  300 mg Oral Daily  .  latanoprost  1 drop Left Eye QHS  . metoprolol tartrate  25 mg Oral BID  . multivitamin with minerals  1 tablet Oral Daily  . pantoprazole  80 mg Oral Q1200  . potassium chloride  10 mEq Oral BID  . sodium chloride  3 mL Intravenous Q12H  . timolol  1 drop Both Eyes Daily      ASSESSMENT AND PLAN: Principal Problem:   Atrial fibrillation - Tikosyn initiated, CrCl 39.84 so dose 125 mcg q 12 hr. Follow K and Mg; follow QT. Most likely proceed with elective DCCV Monday if patient remains in atrial fibrillation; continue pradaxa.  Active Problems:   Diastolic CHF-continue lasix and follow renal function.    Hypertension - follow    Anticoagulant long-term use - continue Pradaxa    Pacemaker-St.Jude - functioning well at last check.   Signed, Olga Millers , MD  10:30 AM 06/21/2013

## 2013-06-22 DIAGNOSIS — I4891 Unspecified atrial fibrillation: Secondary | ICD-10-CM | POA: Diagnosis not present

## 2013-06-22 DIAGNOSIS — E871 Hypo-osmolality and hyponatremia: Secondary | ICD-10-CM | POA: Diagnosis not present

## 2013-06-22 DIAGNOSIS — I5032 Chronic diastolic (congestive) heart failure: Secondary | ICD-10-CM | POA: Diagnosis not present

## 2013-06-22 DIAGNOSIS — I509 Heart failure, unspecified: Secondary | ICD-10-CM | POA: Diagnosis not present

## 2013-06-22 LAB — BASIC METABOLIC PANEL
BUN: 13 mg/dL (ref 6–23)
Chloride: 91 mEq/L — ABNORMAL LOW (ref 96–112)
GFR calc Af Amer: 60 mL/min — ABNORMAL LOW (ref >90)
Glucose, Bld: 96 mg/dL (ref 70–99)
Potassium: 3.9 mEq/L (ref 3.5–5.1)

## 2013-06-22 MED ORDER — DOFETILIDE 250 MCG PO CAPS
250.0000 ug | ORAL_CAPSULE | Freq: Two times a day (BID) | ORAL | Status: DC
  Administered 2013-06-22 – 2013-06-23 (×3): 250 ug via ORAL
  Filled 2013-06-22 (×4): qty 1

## 2013-06-22 NOTE — Progress Notes (Signed)
   CARE MANAGEMENT NOTE 06/22/2013  Patient:  [name], [name]   Account Number:  [id number]  Date Initiated:  06/22/2013  Documentation initiated by:  [facility]  Subjective/Objective Assessment:   Atrial fibrillation - Tikosyn     Action/Plan:   Anticipated DC Date:  06/24/2013   Anticipated DC Plan:  HOME W HOME HEALTH SERVICES      DC Planning Services  CM consult      Choice offered to / List presented to:             Status of service:  In process, will continue to follow Medicare Important Message given?   (If response is "NO", the following Medicare IM given date fields will be blank) Date Medicare IM given:   Date Additional Medicare IM given:    Discharge Disposition:    Per UR Regulation:    If discussed at Long Length of Stay Meetings, dates discussed:    Comments:  06/22/2013 1150 NCM spoke to pt and states she uses mail order pharmacy, [facility] for medications. [facility] [address] [facility] [phone]  if [facility] cannot deliver the medication before her 7 day supply ends. Explained she will receive 7 day supply of Tikosyn prior to dc home to allow pharmacy to order medication. States she has RW and other DME at home. Will have NCM follow up on 10/6 with [facility] on deliver of medication if Rx is faxed to them. [facility]ed [facility] and they do have T[facility]osyn in stock. Isidoro Donning RN CCM Case Mgmt phone [phone]

## 2013-06-22 NOTE — Progress Notes (Signed)
Patient converted to sinus rhythm. EKG completed to confirm. On-call MD notified. Will continue to monitor.

## 2013-06-22 NOTE — Progress Notes (Signed)
     Patient Name: [name] Date of Encounter: 06/22/2013  Principal Problem:   Atrial fibrillation Active Problems:   Hypertension   Diastolic CHF, chronic   Anticoagulant long-term use   Hyponatremia   Pacemaker-St.Jude    SUBJECTIVE: Patient complains of DOE and fatigue; no chest pain or palpitations.  OBJECTIVE Filed Vitals:   06/21/13 1057 06/21/13 1330 06/21/13 2056 06/22/13 0507  BP: 143/97 123/77 152/89 141/92  Pulse: 77 69 87 93  Temp:  97.5 F (36.4 C) 98.5 F (36.9 C) 98.1 F (36.7 C)  TempSrc:  Oral Oral Oral  Resp:  24 20 17   Height:      Weight:    145 lb 8 oz (65.998 kg)  SpO2: 94% 98% 99% 96%    Intake/Output Summary (Last 24 hours) at 06/22/13 0911 Last data filed at 06/22/13 0813  Gross per 24 hour  Intake    463 ml  Output   2450 ml  Net  -1987 ml   Filed Weights   06/20/13 1519 06/21/13 0419 06/22/13 0507  Weight: 149 lb 4 oz (67.7 kg) 148 lb 8 oz (67.359 kg) 145 lb 8 oz (65.998 kg)    PHYSICAL EXAM General: Well developed, well nourished, female in no acute distress. Head: Normal  Neck: Supple Lungs:  Minimal basilar crackles Heart: Irregular Abdomen: Soft, non-tender, non-distended Extremities: Trace pedal edema.  Neuro: Alert and oriented X 3. Moves all extremities spontaneously. Psych: Normal affect.  LABS: Basic Metabolic Panel:  Recent Labs  16/10/96 0652 06/22/13 0425  NA 131* 130*  K 4.9 3.9  CL 93* 91*  CO2 26 31  GLUCOSE 84 96  BUN 14 13  CREATININE 0.88 0.97  CALCIUM 9.3 9.2  MG 2.0 2.0   TELE: atrial fib, controlled response        ECG: 06/20/2013 Atrial fib Vent. rate 74 BPM PR interval * ms QRS duration 78 ms QT/QTc 370/410 ms P-R-T axes * 30 -1 QT 0.32    Current Medications:  . cholecalciferol  1,000 Units Oral Daily  . dabigatran  75 mg Oral Q12H  . divalproex  500 mg Oral QHS  . dofetilide  125 mcg Oral Q12H  . fesoterodine  4 mg Oral Daily  . furosemide  40 mg Oral Daily  .  irbesartan  300 mg Oral Daily  . latanoprost  1 drop Left Eye QHS  . metoprolol tartrate  25 mg Oral BID  . multivitamin with minerals  1 tablet Oral Daily  . pantoprazole  80 mg Oral Q1200  . potassium chloride  10 mEq Oral Daily  . sodium chloride  3 mL Intravenous Q12H  . timolol  1 drop Both Eyes Daily      ASSESSMENT AND PLAN: Principal Problem:   Atrial fibrillation - Tikosyn initiated, CrCl 50; increase dose to 250 mcg q 12 hr. Follow K and Mg; follow QT. Most likely proceed with elective DCCV Monday if patient remains in atrial fibrillation; continue pradaxa. Keep NPO after MN.  Active Problems:   Diastolic CHF-continue lasix and follow renal function.    Hypertension - follow    Anticoagulant long-term use - continue Pradaxa    Pacemaker-St.Jude - functioning well at last check.   Signed, Olga Millers , MD  9:11 AM 06/22/2013

## 2013-06-23 ENCOUNTER — Telehealth: Payer: Self-pay | Admitting: Internal Medicine

## 2013-06-23 DIAGNOSIS — I5032 Chronic diastolic (congestive) heart failure: Secondary | ICD-10-CM | POA: Diagnosis not present

## 2013-06-23 DIAGNOSIS — Z950 Presence of cardiac pacemaker: Secondary | ICD-10-CM

## 2013-06-23 DIAGNOSIS — E871 Hypo-osmolality and hyponatremia: Secondary | ICD-10-CM | POA: Diagnosis not present

## 2013-06-23 DIAGNOSIS — Z7901 Long term (current) use of anticoagulants: Secondary | ICD-10-CM | POA: Diagnosis not present

## 2013-06-23 DIAGNOSIS — I4891 Unspecified atrial fibrillation: Secondary | ICD-10-CM | POA: Diagnosis not present

## 2013-06-23 DIAGNOSIS — I509 Heart failure, unspecified: Secondary | ICD-10-CM | POA: Diagnosis not present

## 2013-06-23 DIAGNOSIS — I495 Sick sinus syndrome: Secondary | ICD-10-CM

## 2013-06-23 LAB — BASIC METABOLIC PANEL
BUN: 11 mg/dL (ref 6–23)
CO2: 29 mEq/L (ref 19–32)
Chloride: 91 mEq/L — ABNORMAL LOW (ref 96–112)
GFR calc Af Amer: 72 mL/min — ABNORMAL LOW (ref >90)
Glucose, Bld: 89 mg/dL (ref 70–99)
Potassium: 4 mEq/L (ref 3.5–5.1)

## 2013-06-23 MED ORDER — DOFETILIDE 250 MCG PO CAPS: 250.0000 ug | ORAL_CAPSULE | Freq: Two times a day (BID) | ORAL | Status: DC

## 2013-06-23 MED ORDER — DOFETILIDE 500 MCG PO CAPS: 500.0000 ug | ORAL_CAPSULE | Freq: Two times a day (BID) | ORAL | Status: DC

## 2013-06-23 NOTE — Care Management Note (Signed)
    Page 1 of 1   06/23/2013     11:02:25 AM   CARE MANAGEMENT NOTE 06/23/2013  Patient:  [name], [name]   Account Number:  [id number]  Date Initiated:  06/22/2013  Documentation initiated by:  [facility]  Subjective/Objective Assessment:   Atrial fibrillation - Tikosyn     DC Planning Services  CM consult   Comments:  06/23/13 1018 Laurana Magistro RN MSN BSN CCM Pt and spouse want to have Tikosyn script filled by [facility] - will check copay and ability of mail order pharmacy to deliver within 7-day period. 1030 Per CMA, [facility] will not guarantee shipment within 7 days even if pt pays for express shipping.  [facility] be [facility], [facility] rep requests pt get 30-day supply @ local pharmacy and they can guarantee delivery of 90-day supply within 2-3 wks. Talked with pt and spouse and they will get [facility] supply [facility].  06/22/2013 1150 NCM spoke to pt and states she uses mail order pharmacy, [facility] for medications. [facility] [address] # [phone]  if [facility] cannot deliver the medication before her 7 day supply ends. Explained she will receive 7 day supply of Tikosyn prior to dc home to allow pharmacy to order medication. States she has RW and other DME at home. Will have NCM follow up on 10/6 with [facility] on deliver of medication if Rx is faxed to them. [facility] and they do have Tikosyn in stock. Isidoro Donning RN CCM Case Mgmt phone [phone]

## 2013-06-23 NOTE — Progress Notes (Signed)
Discharge instructions given to pt along with 7 day supply of tikosyn from main pharmacy. Pt and husband verbalized their understanding of discharge instructions and new medications. Pt is stable for discharge. Will provide wheelchair to pt's car.

## 2013-06-23 NOTE — Telephone Encounter (Signed)
New problem     TCM 7 day with Dawayne Patricia. appt on 10/10 .  Per Bjorn Loser pa

## 2013-06-23 NOTE — Progress Notes (Signed)
     Patient Name: [name] Date of Encounter: 06/23/2013  Principal Problem:   Atrial fibrillation Active Problems:   Hypertension   Diastolic CHF, chronic   Anticoagulant long-term use   Hyponatremia   Pacemaker-St.Jude    SUBJECTIVE: SOB is stable.  She feels "better" in sinus rhythm  OBJECTIVE Filed Vitals:   06/22/13 1341 06/22/13 2132 06/22/13 2246 06/23/13 0356  BP: 93/65 124/77  134/75  Pulse: 80 59 66 60  Temp: 97.8 F (36.6 C) 98.4 F (36.9 C)  97.4 F (36.3 C)  TempSrc: Oral Oral  Oral  Resp: 20 18  18   Height:      Weight:    145 lb 8.1 oz (66 kg)  SpO2: 100% 99%  98%    Intake/Output Summary (Last 24 hours) at 06/23/13 0834 Last data filed at 06/22/13 2200  Gross per 24 hour  Intake    583 ml  Output    200 ml  Net    383 ml   Filed Weights   06/21/13 0419 06/22/13 0507 06/23/13 0356  Weight: 148 lb 8 oz (67.359 kg) 145 lb 8 oz (65.998 kg) 145 lb 8.1 oz (66 kg)    PHYSICAL EXAM General: Well developed, well nourished, female in no acute distress. Head: Normal  Neck: Supple Lungs:  Minimal basilar crackles Heart: RRR Abdomen: Soft, non-tender, non-distended Extremities: Trace pedal edema.  Neuro: Alert and oriented X 3. Moves all extremities spontaneously. Psych: Normal affect.  LABS: Basic Metabolic Panel:  Recent Labs  14/78/29 0425 06/23/13 0622  NA 130* 130*  K 3.9 4.0  CL 91* 91*  CO2 31 29  GLUCOSE 96 89  BUN 13 11  CREATININE 0.97 0.83  CALCIUM 9.2 9.2  MG 2.0 2.1   TELE: atrial paced today  ekg reveals stable Qtc  Current Medications:  . cholecalciferol  1,000 Units Oral Daily  . dabigatran  75 mg Oral Q12H  . divalproex  500 mg Oral QHS  . dofetilide  250 mcg Oral Q12H  . fesoterodine  4 mg Oral Daily  . furosemide  40 mg Oral Daily  . irbesartan  300 mg Oral Daily  . latanoprost  1 drop Left Eye QHS  . metoprolol tartrate  25 mg Oral BID  . multivitamin with minerals  1 tablet Oral Daily  .  pantoprazole  80 mg Oral Q1200  . potassium chloride  10 mEq Oral Daily  . sodium chloride  3 mL Intravenous Q12H  . timolol  1 drop Both Eyes Daily      ASSESSMENT AND PLAN: 1. afib Converted to sinus with tikosyn qtc is stable Dc to home later this am if QT remains stable after AM dose Continue pradaxa  Will need bmet, Mg, and EKG with transitional care appointment in 1 week followup with me in 4 weeks  2. Chronic diastolic dysfunction Stable No change required today  3. HTN Stable No change required today  DC to home later today  Signed, Hillis Range , MD  8:34 AM 06/23/2013

## 2013-06-23 NOTE — Discharge Summary (Signed)
CARDIOLOGY DISCHARGE SUMMARY   Patient ID: [name] MRN: [id number] DOB/AGE: 77/29/1926 77 y.o.  Admit date: 06/20/2013 Discharge date: 06/23/2013  Primary Discharge Diagnosis:   Atrial fibrillation   Secondary Discharge Diagnosis:    Diastolic CHF, chronic   Hypertension   Anticoagulant long-term use   Hyponatremia   Pacemaker-[employer]  Procedures: Two-view chest x-ray  Hospital Course: [name] is a 79 y.o. female with a history of atrial fibrillation. She was evaluated by EP, and Tikosyn was felt to be the best medication to keep her in sinus rhythm. She was seen by Dr. Johney Frame in the office and admission for Tikosyn loading was scheduled once sotalol had washed out. She was seen and Tikosyn clinic and her labs were appropriate for loading. She came to the hospital and was admitted for Tikosyn loading on 06/20/2013.  Her dose was calculated based on the GFR 39.84. The initial dose was 125 mcg every 12 hours. However, while in the hospital, her creatinine improved. This improved her GFR and the dose was increased to 50 mcg every 12 hours. She tolerated this dose well. Her ECG and labs were followed closely.   She has a history of hyponatremia and her sodium level remained low but stable. Her magnesium level was followed, but did not require supplementation. Her potassium level was 4.9 on admission. She had been taking 10 mEq twice a day and this was decreased to once daily. Her potassium at discharge is 4.0. The 10 mEq daily dose will continue at discharge. Her labs and ECG will be followed closely. Her long-term prescription for a once daily dosing can be sent and if her potassium dose remains stable.  She had been on Pradaxa prior to admission and this was continued.  She was in atrial fibrillation on admission, but spontaneously converted to sinus rhythm on 06/22/2013.  She was seen by care management and arrangements were made for her to receive a short-term  prescription for Tikosyn from the hospital as well as longer-term prescriptions from a local pharmacy and a 3 month supply from mail order.  On 06/23/2013, [name] was seen by Dr. Johney Frame. Her ECG was reviewed after her sixth dose of Tikosyn. Her QTc was slightly prolonged but Dr. Johney Frame did not feel any dose changes were needed. [name] is considered stable for discharge, to follow up as an outpatient.  Labs:  Lab Results  Component Value Date   WBC 5.3 05/28/2013   HGB 12.3 05/28/2013   HCT 36.2 05/28/2013   MCV 84.8 05/28/2013   PLT 167 05/28/2013     Recent Labs Lab 06/23/13 0622  NA 130*  K 4.0  CL 91*  CO2 29  BUN 11  CREATININE 0.83  CALCIUM 9.2  GLUCOSE 89   Magnesium  Date Value Range Status  06/23/2013 2.1  1.5 - 2.5 mg/dL Final   Pro B Natriuretic peptide (BNP)  Date/Time Value Range Status  06/21/2013  6:52 AM 4016.0* 0 - 450 pg/mL Final  05/08/2013  3:41 PM 570.0* 0.0 - 100.0 pg/mL Final     Radiology: Dg Chest 2 View 06/20/2013   CLINICAL DATA:  Atrial fibrillation, shortness of breath  EXAM: CHEST  2 VIEW  COMPARISON:  02/28/2013  FINDINGS: Cardiac shadow is stable. A pacing device is again seen. Mild interstitial changes are again noted similar to that seen on the prior exam. No focal infiltrate or sizable effusion is seen. Mild hyperinflation is noted.  IMPRESSION: Chronic  changes without acute abnormality.   Electronically Signed   By: Alcide Clever M.D.   On: 06/20/2013 19:18    EKG: 23-Jun-2013 09:59:28 Sinus rhythm with 1st degree A-V block Septal infarct , age undetermined Vent. rate 61 BPM PR interval 234 ms QRS duration 88 ms QT/QTc 440/442 ms P-R-T axes 77 50 9  FOLLOW UP PLANS AND APPOINTMENTS Allergies  Allergen Reactions  . Amiodarone     Possibly caused pneumonitis     Medication List         cholecalciferol 1000 UNITS tablet  Commonly known as:  VITAMIN D  Take 1,000 Units by mouth daily.     CITRACAL PO  Take 1 tablet by mouth 2  (two) times daily.     dabigatran 75 MG Caps capsule  Commonly known as:  PRADAXA  Take 1 capsule (75 mg total) by mouth every 12 (twelve) hours. Hold PM dose today 03/03/2013. Start 75 mg twice daily tomorrow 03/04/2013..     divalproex 500 MG DR tablet  Commonly known as:  DEPAKOTE  Take 500 mg by mouth at bedtime.     dofetilide 250 MCG capsule  Commonly known as:  TIKOSYN  Take 1 capsule (250 mcg total) by mouth 2 (two) times daily.     dofetilide 250 MCG capsule  Commonly known as:  TIKOSYN  Take 1 capsule (250 mcg total) by mouth 2 (two) times daily.     esomeprazole 20 MG capsule  Commonly known as:  NEXIUM  Take 40 mg by mouth daily before breakfast.     furosemide 40 MG tablet  Commonly known as:  LASIX  Take 1 tablet (40 mg total) by mouth daily.     HYDROcodone-acetaminophen 5-500 MG per tablet  Commonly known as:  VICODIN  - Take 1 tablet by mouth every 6 (six) hours as needed. For pain  -      metoprolol tartrate 25 MG tablet  Commonly known as:  LOPRESSOR  Take 1 tablet (25 mg total) by mouth 2 (two) times daily.     multivitamins ther. w/minerals Tabs tablet  Take 1 tablet by mouth daily.     olmesartan 40 MG tablet  Commonly known as:  BENICAR  Take 1 tablet (40 mg total) by mouth daily.     polyethylene glycol powder powder  Commonly known as:  GLYCOLAX/MIRALAX  Take 17 g by mouth daily as needed. For constipation     potassium chloride 10 MEQ tablet  Commonly known as:  K-DUR,KLOR-CON  Take 1 tablet (10 mEq total) by mouth 2 (two) times daily.     timolol 0.5 % ophthalmic solution  Commonly known as:  BETIMOL  Place 1 drop into both eyes daily.     tolterodine 4 MG 24 hr capsule  Commonly known as:  DETROL LA  Take 4 mg by mouth every evening.     travoprost (benzalkonium) 0.004 % ophthalmic solution  Commonly known as:  TRAVATAN  Place 1 drop into the left eye at bedtime.        Discharge Orders   Future Appointments Provider  Department Dept Phone   06/27/2013 2:45 PM [facility] [place] Office [phone]   06/27/2013 3:00 PM Rosalio Macadamia, NP [facility] [place] Office [phone]   07/28/2013 11:30 AM Hillis Range, MD [facility] [place] Office [phone]   08/25/2013 9:00 AM [facility] [place] Office [phone]   Future Orders Complete By Expires  Diet - low sodium heart healthy  As directed    Increase activity slowly  As directed      Follow-up Information   Follow up with Norma Fredrickson, NP On 06/27/2013. 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 Do not have to be fasting and can take morning medications.)    Specialty:  Nurse Practitioner   Contact information:   [address] [place] [address] [phone]       BRING ALL MEDICATIONS WITH YOU TO FOLLOW UP APPOINTMENTS  Time spent with patient to include physician time: 38 min  Signed: Theodore Demark, PA-C 06/23/2013, 2:25 PM   Hillis Range MD

## 2013-06-24 NOTE — Telephone Encounter (Signed)
Spoke with patient, states she is doing pretty good. Denies any questions. Confirmed appointment with Dawayne Patricia NP on Friday. Advised to call back if any questions before then.

## 2013-06-25 ENCOUNTER — Telehealth: Payer: Self-pay | Admitting: Internal Medicine

## 2013-06-25 NOTE — Telephone Encounter (Signed)
New problem  Pt was discharged from the hospital// medication inquiry// sent to medications.

## 2013-06-27 ENCOUNTER — Encounter: Payer: Self-pay | Admitting: Nurse Practitioner

## 2013-06-27 ENCOUNTER — Ambulatory Visit (INDEPENDENT_AMBULATORY_CARE_PROVIDER_SITE_OTHER): Payer: Medicare Other | Admitting: *Deleted

## 2013-06-27 ENCOUNTER — Ambulatory Visit (INDEPENDENT_AMBULATORY_CARE_PROVIDER_SITE_OTHER): Payer: Medicare Other | Admitting: Nurse Practitioner

## 2013-06-27 VITALS — BP 160/90 | HR 60 | Ht 63.0 in | Wt 147.1 lb

## 2013-06-27 DIAGNOSIS — I4891 Unspecified atrial fibrillation: Secondary | ICD-10-CM

## 2013-06-27 DIAGNOSIS — I10 Essential (primary) hypertension: Secondary | ICD-10-CM

## 2013-06-27 DIAGNOSIS — I701 Atherosclerosis of renal artery: Secondary | ICD-10-CM | POA: Diagnosis not present

## 2013-06-27 DIAGNOSIS — Z950 Presence of cardiac pacemaker: Secondary | ICD-10-CM

## 2013-06-27 LAB — BASIC METABOLIC PANEL
CO2: 31 mEq/L (ref 19–32)
Calcium: 9.4 mg/dL (ref 8.4–10.5)
Chloride: 93 mEq/L — ABNORMAL LOW (ref 96–112)
Creatinine, Ser: 0.9 mg/dL (ref 0.4–1.2)
Glucose, Bld: 126 mg/dL — ABNORMAL HIGH (ref 70–99)

## 2013-06-27 NOTE — Progress Notes (Signed)
[name] Date of Birth: 1924-11-03 Medical Record #[id number]  History of Present Illness: Leda is seen back today for a post hospital visit. This is a TCM visit. Seen for Dr. [place]. Has had CHF, tachy brady with PPM in place, past cardioversions in October of 2012 and again in January of 2014. Recurrent atrial fib in June and was cardioverted and started on Multaq. Has been intolerant to amiodarone due to possible pulmonary toxicity. Then failed on Multaq due to recurrent arrhythmia. Tikosyn was considered but QT was felt to be too long - so tried Sotalol but failed to hold her in sinus - so has been put on Tikosyn.   Has been home only 4 days. Feels puny. Says she feels "zapped". No energy. Short of breath. Some swelling in her feet. Weight is stable. No chest pain. Wants to sleep a lot. Did well for about a day and a half.    Current Outpatient Prescriptions  Medication Sig Dispense Refill  . Calcium Citrate (CITRACAL PO) Take 1 tablet by mouth 2 (two) times daily.       . cholecalciferol (VITAMIN D) 1000 UNITS tablet Take 1,000 Units by mouth daily.       . dabigatran (PRADAXA) 75 MG CAPS Take 1 capsule (75 mg total) by mouth every 12 (twelve) hours. Hold PM dose today 03/03/2013. Start 75 mg twice daily tomorrow 03/04/2013..  180 capsule  3  . divalproex (DEPAKOTE) 500 MG EC tablet Take 500 mg by mouth at bedtime.        . dofetilide (TIKOSYN) 250 MCG capsule Take 1 capsule (250 mcg total) by mouth 2 (two) times daily.  60 capsule  11  . dofetilide (TIKOSYN) 250 MCG capsule Take 1 capsule (250 mcg total) by mouth 2 (two) times daily.  180 capsule  3  . esomeprazole (NEXIUM) 20 MG capsule Take 40 mg by mouth daily before breakfast.      . furosemide (LASIX) 40 MG tablet Take 1 tablet (40 mg total) by mouth daily.  90 tablet  4  . HYDROcodone-acetaminophen (VICODIN) 5-500 MG per tablet Take 1 tablet by mouth every 6 (six) hours as needed. For pain       . metoprolol tartrate  (LOPRESSOR) 25 MG tablet Take 1 tablet (25 mg total) by mouth 2 (two) times daily.  180 tablet  4  . Multiple Vitamins-Minerals (MULTIVITAMINS THER. W/MINERALS) TABS Take 1 tablet by mouth daily.        Marland Kitchen olmesartan (BENICAR) 40 MG tablet Take 1 tablet (40 mg total) by mouth daily.  90 tablet  4  . polyethylene glycol powder (GLYCOLAX/MIRALAX) powder Take 17 g by mouth daily as needed. For constipation      . potassium chloride (K-DUR,KLOR-CON) 10 MEQ tablet Take 1 tablet (10 mEq total) by mouth 2 (two) times daily.  180 tablet  4  . timolol (BETIMOL) 0.5 % ophthalmic solution Place 1 drop into both eyes daily.       Marland Kitchen tolterodine (DETROL LA) 4 MG 24 hr capsule Take 4 mg by mouth every evening.       . travoprost, benzalkonium, (TRAVATAN) 0.004 % ophthalmic solution Place 1 drop into the left eye at bedtime.        No current facility-administered medications for this visit.    Allergies  Allergen Reactions  . Amiodarone     Possibly caused pneumonitis    Past Medical History  Diagnosis Date  . Pneumonia 04/2010  .  Hypertension   . Renal artery stenosis 2008    STATUS POST ANGIOPLASTY  . GERD (gastroesophageal reflux disease)   . Back pain     CHRONIC  . Atrial fibrillation     Prior TEE/CV in May 2012; managed with rate control and anticoagulation  . Hyponatremia     improved  . Seizure disorder     on anti-epileptic therapy  . Urinary incontinence   . Osteopenia   . SIADH (syndrome of inappropriate ADH production)     h/o HYPONATREMIA ; LIKELY RELATED TO HER LUNG PROCESS  . Arthritis   . Hiatal hernia   . Tachy-brady syndrome     with pauses noted in May 2012; s/p PTVP October 2012  . Dysphagia   . (HFpEF) heart failure with preserved ejection fraction   . Pacemaker     Past Surgical History  Procedure Laterality Date  . Cholecystectomy    . Back surgery      X2  . Total knee arthroplasty      RIGHT AND LEFT KNEE  . Tonsillectomy    . Appendectomy    . Cardiac  catheterization  2007    Normal  . Knee arthroscopy      LEFT KNEE  . Cataract extraction    . Shoulder surgery    . Renal angioplasty  01/2007    RIGHT  . Partial hysterectomy    . Pacemaker insertion  06/2011    SJM Accent DR RF implanted by Dr Johney Frame  . Eye surgery      cataracts removed on both eyes  . Cardioversion    . Cardioversion  10/14/2012    Procedure: CARDIOVERSION;  Surgeon: Vesta Mixer, MD;  Location: [facility] ENDOSCOPY;  Service: Cardiovascular;  Laterality: N/A;  . Cardioversion N/A 05/30/2013    Procedure: CARDIOVERSION;  Surgeon: Hillis Range, MD;  Location: [facility] OR;  Service: Cardiovascular;  Laterality: N/A;    History  Smoking status  . Never Smoker   Smokeless tobacco  . Never Used    History  Alcohol Use No    Family History  Problem Relation Age of Onset  . Stroke Mother   . Diabetes Mother   . Heart disease Mother     Unkown  . Stroke Father   . Diabetes Father   . Cancer Father     PROSTATE  . Cancer Brother     RENAL AND BLADDER  . Lung disease Brother   . Lung disease Sister   . Hypertension Daughter   . Hypertension Son     Review of Systems: The review of systems is per the HPI.  All other systems were reviewed and are negative.  Physical Exam: BP 160/90  Pulse 60  Ht 5\' 3"  (1.6 m)  Wt 147 lb 1.9 oz (66.733 kg)  BMI 26.07 kg/m2 BP is 130/80 by me.  Patient is very pleasant and in no acute distress. She looks chronically ill. She is in a wheelchair. Skin is warm and dry. Color is normal.  HEENT is unremarkable. Normocephalic/atraumatic. PERRL. Sclera are nonicteric. Neck is supple. No masses. No JVD. Lungs are fairly clear. Cardiac exam shows a regular rate and rhythm. Abdomen is soft. Extremities are with trace edema. Gait not tested. No gross neurologic deficits noted.  LABORATORY DATA: EKG with a pacing. QT of 456.  Lab Results  Component Value Date   WBC 5.3 05/28/2013   HGB 12.3 05/28/2013   HCT 36.2 05/28/2013   PLT 167  05/28/2013   GLUCOSE 126* 06/27/2013   ALT 26 11/23/2011   AST 31 11/23/2011   NA 132* 06/27/2013   K 4.2 06/27/2013   CL 93* 06/27/2013   CREATININE 0.9 06/27/2013   BUN 15 06/27/2013   CO2 31 06/27/2013   TSH 1.25 05/08/2013   INR 1.69* 03/02/2013   Her pacemaker is interrogated today. I do not understand this interrogation. The patient was clearly in atrial fib on the day of her admission on 10/3 and converted on 10/5. The device is saying that her last episode was 06/13/2013. Device does not appear to have been cleared recently. Her mode switch histogram looks like she has had recurrent AF. She is in sinus today.   Assessment / Plan: 1. PAF - intolerant to multiple drugs - now on low dose Tikosyn - I cannot tell if she has had PAF but from her report and I do not understand her current interrogation, her symptoms are concurrent with recurrent AF. Will need to discuss with Dr. Johney Frame. BMET from earlier today looks better. The device counters are cleared today. We will get a manual transmission this weekend.   2. Diastolic HF - weight is unchanged. I have left her on her current regimen for now.   Patient is agreeable to this plan and will call if any problems develop in the interim.   Rosalio Macadamia, RN, ANP-C [facility] [address] [place], [place]  [address]

## 2013-06-27 NOTE — Patient Instructions (Addendum)
I will talk with Dr. Johney Frame about how you are doing  Transmit your pacemaker device on Sunday evening  We will see what your labs look like.   For now, keep your next appointments  Call the [facility] office at [phone] if you have any questions, problems or concerns.

## 2013-06-27 NOTE — Progress Notes (Signed)
Device showing Mode switch now 34% of time (06/16/13 recorded 21% of time) but does not have any mode switch episodes since 06/13/13.  Counters were reset.  I asked pt to send extra home transmission Sun 06/29/13 to monitor Afib percentage, if any.  Regular Merlin transmission scheduled 08/25/13.

## 2013-06-30 LAB — PACEMAKER DEVICE OBSERVATION
BAMS-0001: 150 {beats}/min
BAMS-0003: 60 {beats}/min
DEVICE MODEL PM: 7280874

## 2013-07-01 ENCOUNTER — Encounter: Payer: Self-pay | Admitting: Internal Medicine

## 2013-07-02 ENCOUNTER — Encounter: Payer: Self-pay | Admitting: Nurse Practitioner

## 2013-07-02 ENCOUNTER — Ambulatory Visit (INDEPENDENT_AMBULATORY_CARE_PROVIDER_SITE_OTHER): Payer: Medicare Other | Admitting: Nurse Practitioner

## 2013-07-02 VITALS — BP 160/90 | HR 60 | Ht 63.0 in | Wt 149.4 lb

## 2013-07-02 DIAGNOSIS — I4891 Unspecified atrial fibrillation: Secondary | ICD-10-CM

## 2013-07-02 DIAGNOSIS — I5032 Chronic diastolic (congestive) heart failure: Secondary | ICD-10-CM | POA: Diagnosis not present

## 2013-07-02 NOTE — Progress Notes (Signed)
[name] Date of Birth: 14-Aug-1925 Medical Record #[id number]  History of Present Illness: [name] is seen back today for a work in visit. Seen for Dr. [place]. Has had CHF, tachy brady with PPM in place, past cardioversions in October of 2012 and again in January of 2014. Recurrent atrial fib in June and was cardioverted and started on Multaq. Has been intolerant to amiodarone due to possible pulmonary toxicity. Then failed on Multaq due to recurrent arrhythmia. Tikosyn was considered but QT was felt to be too long - so tried Sotalol but this failed to hold her in sinus - so has been put on Tikosyn.   I saw her on Friday. She was feeling puny and "zapped". No energy. Short of breath. Some swelling in her feet. Weight was stable. No chest pain. Her pacemaker check was difficult to discern. May have an issue with undersensing. Appeared to me that she may have been going in and out of rhythm since her discharge. She was A pacing on Friday and clearly in rhythm on Friday.  She comes in today. Here with her husband. Brought her back today for a full pacemaker interrogation. Remains on her Tikosyn. She says she may be a bit better - husband says she is clearly doing better. She is now doing more for herself. Not real short of breath. No swelling. No chest pain.   Current Outpatient Prescriptions  Medication Sig Dispense Refill  . Calcium Citrate (CITRACAL PO) Take 1 tablet by mouth 2 (two) times daily.       . cholecalciferol (VITAMIN D) 1000 UNITS tablet Take 1,000 Units by mouth daily.       . dabigatran (PRADAXA) 75 MG CAPS Take 1 capsule (75 mg total) by mouth every 12 (twelve) hours. Hold PM dose today 03/03/2013. Start 75 mg twice daily tomorrow 03/04/2013..  180 capsule  3  . divalproex (DEPAKOTE) 500 MG EC tablet Take 500 mg by mouth at bedtime.        . dofetilide (TIKOSYN) 250 MCG capsule Take 1 capsule (250 mcg total) by mouth 2 (two) times daily.  60 capsule  11  . dofetilide (TIKOSYN)  250 MCG capsule Take 1 capsule (250 mcg total) by mouth 2 (two) times daily.  180 capsule  3  . esomeprazole (NEXIUM) 20 MG capsule Take 40 mg by mouth daily before breakfast.      . furosemide (LASIX) 40 MG tablet Take 1 tablet (40 mg total) by mouth daily.  90 tablet  4  . HYDROcodone-acetaminophen (VICODIN) 5-500 MG per tablet Take 1 tablet by mouth every 6 (six) hours as needed. For pain       . metoprolol tartrate (LOPRESSOR) 25 MG tablet Take 1 tablet (25 mg total) by mouth 2 (two) times daily.  180 tablet  4  . Multiple Vitamins-Minerals (MULTIVITAMINS THER. W/MINERALS) TABS Take 1 tablet by mouth daily.        Marland Kitchen olmesartan (BENICAR) 40 MG tablet Take 1 tablet (40 mg total) by mouth daily.  90 tablet  4  . polyethylene glycol powder (GLYCOLAX/MIRALAX) powder Take 17 g by mouth daily as needed. For constipation      . potassium chloride (K-DUR,KLOR-CON) 10 MEQ tablet Take 1 tablet (10 mEq total) by mouth 2 (two) times daily.  180 tablet  4  . timolol (BETIMOL) 0.5 % ophthalmic solution Place 1 drop into both eyes daily.       Marland Kitchen tolterodine (DETROL LA) 4 MG 24 hr capsule  Take 4 mg by mouth every evening.       . travoprost, benzalkonium, (TRAVATAN) 0.004 % ophthalmic solution Place 1 drop into the left eye at bedtime.        No current facility-administered medications for this visit.    Allergies  Allergen Reactions  . Amiodarone     Possibly caused pneumonitis    Past Medical History  Diagnosis Date  . Pneumonia 04/2010  . Hypertension   . Renal artery stenosis 2008    STATUS POST ANGIOPLASTY  . GERD (gastroesophageal reflux disease)   . Back pain     CHRONIC  . Atrial fibrillation     Prior TEE/CV in May 2012; managed with rate control and anticoagulation  . Hyponatremia     improved  . Seizure disorder     on anti-epileptic therapy  . Urinary incontinence   . Osteopenia   . SIADH (syndrome of inappropriate ADH production)     h/o HYPONATREMIA ; LIKELY RELATED TO HER  LUNG PROCESS  . Arthritis   . Hiatal hernia   . Tachy-brady syndrome     with pauses noted in May 2012; s/p PTVP October 2012  . Dysphagia   . (HFpEF) heart failure with preserved ejection fraction   . Pacemaker     Past Surgical History  Procedure Laterality Date  . Cholecystectomy    . Back surgery      X2  . Total knee arthroplasty      RIGHT AND LEFT KNEE  . Tonsillectomy    . Appendectomy    . Cardiac catheterization  2007    Normal  . Knee arthroscopy      LEFT KNEE  . Cataract extraction    . Shoulder surgery    . Renal angioplasty  01/2007    RIGHT  . Partial hysterectomy    . Pacemaker insertion  06/2011    SJM Accent DR RF implanted by Dr Johney Frame  . Eye surgery      cataracts removed on both eyes  . Cardioversion    . Cardioversion  10/14/2012    Procedure: CARDIOVERSION;  Surgeon: Vesta Mixer, MD;  Location: [facility] ENDOSCOPY;  Service: Cardiovascular;  Laterality: N/A;  . Cardioversion N/A 05/30/2013    Procedure: CARDIOVERSION;  Surgeon: Hillis Range, MD;  Location: [facility] OR;  Service: Cardiovascular;  Laterality: N/A;    History  Smoking status  . Never Smoker   Smokeless tobacco  . Never Used    History  Alcohol Use No    Family History  Problem Relation Age of Onset  . Stroke Mother   . Diabetes Mother   . Heart disease Mother     Unkown  . Stroke Father   . Diabetes Father   . Cancer Father     PROSTATE  . Cancer Brother     RENAL AND BLADDER  . Lung disease Brother   . Lung disease Sister   . Hypertension Daughter   . Hypertension Son     Review of Systems: The review of systems is per the HPI.  All other systems were reviewed and are negative.  Physical Exam: BP 160/90  Pulse 60  Ht 5\' 3"  (1.6 m)  Wt 149 lb 6.4 oz (67.767 kg)  BMI 26.47 kg/m2 Patient is very pleasant and in no acute distress. Still looks chronically ill. Skin is warm and dry. Color is normal.  HEENT is unremarkable. Normocephalic/atraumatic. PERRL. Sclera are  nonicteric. Neck is supple. No masses.  No JVD. Lungs are clear. Cardiac exam shows a regular rate and rhythm. Abdomen is soft. Extremities are without edema. Gait and ROM are intact. No gross neurologic deficits noted.  LABORATORY DATA: EKG today with A pacing - QT of 452  Pacemaker is fully interrogated - she has not had any mode switches - in sinus today.   Lab Results  Component Value Date   WBC 5.3 05/28/2013   HGB 12.3 05/28/2013   HCT 36.2 05/28/2013   PLT 167 05/28/2013   GLUCOSE 126* 06/27/2013   ALT 26 11/23/2011   AST 31 11/23/2011   NA 132* 06/27/2013   K 4.2 06/27/2013   CL 93* 06/27/2013   CREATININE 0.9 06/27/2013   BUN 15 06/27/2013   CO2 31 06/27/2013   TSH 1.25 05/08/2013   INR 1.69* 03/02/2013     Assessment / Plan: 1. PAF - now on Tikosyn - holding in sinus by pacemaker check today. No change in therapy. Will check EKG today.   2. Diastolic HF - seems compensated. No change in therapy.   I think she just needs a little time to get stronger, needs to keep being more active each day. No change in medicines. BMET on Friday was ok. She has been seen by Dr. Johney Frame and Dr. [place] today as well.   Patient is agreeable to this plan and will call if any problems develop in the interim.   Rosalio Macadamia, RN, ANP-C [facility] [address] [place], [place]  [address]

## 2013-07-02 NOTE — Patient Instructions (Signed)
I think you are doing ok.  Stay on your current medicines  See Dr. Johney Frame as planned in November  Keep doing a little bit more each day!!  Call the [facility] office at [phone] if you have any questions, problems or concerns.

## 2013-07-06 ENCOUNTER — Other Ambulatory Visit: Payer: Self-pay | Admitting: Internal Medicine

## 2013-07-11 ENCOUNTER — Encounter: Payer: Medicare Other | Admitting: Internal Medicine

## 2013-07-14 ENCOUNTER — Encounter: Payer: Self-pay | Admitting: Internal Medicine

## 2013-07-14 DIAGNOSIS — I519 Heart disease, unspecified: Secondary | ICD-10-CM | POA: Diagnosis not present

## 2013-07-14 DIAGNOSIS — I10 Essential (primary) hypertension: Secondary | ICD-10-CM | POA: Diagnosis not present

## 2013-07-14 DIAGNOSIS — J8410 Pulmonary fibrosis, unspecified: Secondary | ICD-10-CM | POA: Diagnosis not present

## 2013-07-14 DIAGNOSIS — I4891 Unspecified atrial fibrillation: Secondary | ICD-10-CM | POA: Diagnosis not present

## 2013-07-14 DIAGNOSIS — Z950 Presence of cardiac pacemaker: Secondary | ICD-10-CM | POA: Diagnosis not present

## 2013-07-14 DIAGNOSIS — Z23 Encounter for immunization: Secondary | ICD-10-CM | POA: Diagnosis not present

## 2013-07-14 DIAGNOSIS — Z6826 Body mass index (BMI) 26.0-26.9, adult: Secondary | ICD-10-CM | POA: Diagnosis not present

## 2013-07-17 ENCOUNTER — Encounter: Payer: Medicare Other | Admitting: Internal Medicine

## 2013-07-22 DIAGNOSIS — I4891 Unspecified atrial fibrillation: Secondary | ICD-10-CM | POA: Diagnosis not present

## 2013-07-28 ENCOUNTER — Encounter: Payer: Self-pay | Admitting: Internal Medicine

## 2013-07-28 ENCOUNTER — Ambulatory Visit (INDEPENDENT_AMBULATORY_CARE_PROVIDER_SITE_OTHER): Payer: Medicare Other | Admitting: Internal Medicine

## 2013-07-28 VITALS — BP 158/82 | HR 60 | Ht 62.0 in | Wt 140.0 lb

## 2013-07-28 DIAGNOSIS — I4891 Unspecified atrial fibrillation: Secondary | ICD-10-CM

## 2013-07-28 LAB — MDC_IDC_ENUM_SESS_TYPE_INCLINIC
Battery Remaining Longevity: 78 mo
Brady Statistic RV Percent Paced: 2.2 %
Implantable Pulse Generator Model: 2210
Lead Channel Impedance Value: 400 Ohm
Lead Channel Impedance Value: 610 Ohm
Lead Channel Sensing Intrinsic Amplitude: 12 mV
Lead Channel Setting Pacing Amplitude: 2 V
Lead Channel Setting Sensing Sensitivity: 2 mV

## 2013-07-28 LAB — BASIC METABOLIC PANEL
BUN: 14 mg/dL (ref 6–23)
CO2: 30 mEq/L (ref 19–32)
Calcium: 9.8 mg/dL (ref 8.4–10.5)
Chloride: 91 mEq/L — ABNORMAL LOW (ref 96–112)
Creatinine, Ser: 0.8 mg/dL (ref 0.4–1.2)
Sodium: 131 mEq/L — ABNORMAL LOW (ref 135–145)

## 2013-07-28 LAB — MAGNESIUM: Magnesium: 1.9 mg/dL (ref 1.5–2.5)

## 2013-07-28 NOTE — Progress Notes (Signed)
PCP:  [name], MD Primary Cardiologist:  Dr Swaziland  The patient presents today for electrophysiology followup. She is doing much better with sinus rhythm.  She has had no afib since being discharged on tikosyn.  Today, she denies symptoms of palpitations, chest pain,  dizziness, presyncope, syncope, or neurologic sequela.  Her edema is stable.  The patient feels that she is tolerating medications without difficulties and is otherwise without complaint today.   Past Medical History  Diagnosis Date  . Pneumonia 04/2010  . Hypertension   . Renal artery stenosis 2008    STATUS POST ANGIOPLASTY  . GERD (gastroesophageal reflux disease)   . Back pain     CHRONIC  . Atrial fibrillation     Prior TEE/CV in May 2012; managed with rate control and anticoagulation  . Hyponatremia     improved  . Seizure disorder     on anti-epileptic therapy  . Urinary incontinence   . Osteopenia   . SIADH (syndrome of inappropriate ADH production)     h/o HYPONATREMIA ; LIKELY RELATED TO HER LUNG PROCESS  . Arthritis   . Hiatal hernia   . Tachy-brady syndrome     with pauses noted in May 2012; s/p PTVP October 2012  . Dysphagia   . (HFpEF) heart failure with preserved ejection fraction   . Pacemaker    Past Surgical History  Procedure Laterality Date  . Cholecystectomy    . Back surgery      X2  . Total knee arthroplasty      RIGHT AND LEFT KNEE  . Tonsillectomy    . Appendectomy    . Cardiac catheterization  2007    Normal  . Knee arthroscopy      LEFT KNEE  . Cataract extraction    . Shoulder surgery    . Renal angioplasty  01/2007    RIGHT  . Partial hysterectomy    . Pacemaker insertion  06/2011    SJM Accent DR RF implanted by Dr Johney Frame  . Eye surgery      cataracts removed on both eyes  . Cardioversion    . Cardioversion  10/14/2012    Procedure: CARDIOVERSION;  Surgeon: Vesta Mixer, MD;  Location: [facility] ENDOSCOPY;  Service: Cardiovascular;  Laterality: N/A;  .  Cardioversion N/A 05/30/2013    Procedure: CARDIOVERSION;  Surgeon: Hillis Range, MD;  Location: [facility] OR;  Service: Cardiovascular;  Laterality: N/A;    Current Outpatient Prescriptions  Medication Sig Dispense Refill  . BENICAR 40 MG tablet TAKE 1 TABLET DAILY  90 tablet  0  . Calcium Citrate (CITRACAL PO) Take 1 tablet by mouth 2 (two) times daily.       . cholecalciferol (VITAMIN D) 1000 UNITS tablet Take 1,000 Units by mouth daily.       . dabigatran (PRADAXA) 75 MG CAPS Take 1 capsule (75 mg total) by mouth every 12 (twelve) hours. Hold PM dose today 03/03/2013. Start 75 mg twice daily tomorrow 03/04/2013..  180 capsule  3  . divalproex (DEPAKOTE) 500 MG EC tablet Take 500 mg by mouth at bedtime.        . dofetilide (TIKOSYN) 250 MCG capsule Take 1 capsule (250 mcg total) by mouth 2 (two) times daily.  60 capsule  11  . esomeprazole (NEXIUM) 20 MG capsule Take 40 mg by mouth daily before breakfast.      . furosemide (LASIX) 40 MG tablet TAKE 1 TABLET DAILY  90 tablet  0  .  HYDROcodone-acetaminophen (VICODIN) 5-500 MG per tablet Take 1 tablet by mouth every 6 (six) hours as needed. For pain       . KLOR-CON M10 10 MEQ tablet TAKE 1 TABLET TWICE A DAY  180 tablet  0  . metoprolol tartrate (LOPRESSOR) 25 MG tablet Take 1 tablet (25 mg total) by mouth 2 (two) times daily.  180 tablet  4  . Multiple Vitamins-Minerals (MULTIVITAMINS THER. W/MINERALS) TABS Take 1 tablet by mouth daily.        . polyethylene glycol powder (GLYCOLAX/MIRALAX) powder Take 17 g by mouth daily as needed. For constipation      . timolol (BETIMOL) 0.5 % ophthalmic solution Place 1 drop into both eyes daily.       Marland Kitchen tolterodine (DETROL LA) 4 MG 24 hr capsule Take 4 mg by mouth every evening.       . travoprost, benzalkonium, (TRAVATAN) 0.004 % ophthalmic solution Place 1 drop into the left eye at bedtime.        No current facility-administered medications for this visit.    Allergies  Allergen Reactions  . Amiodarone      Possibly caused pneumonitis    History   Social History  . Marital Status: Married    Spouse Name: N/A    Number of Children: 3  . Years of Education: 10   Occupational History  . [employer]    1980   Social History Main Topics  . Smoking status: Never Smoker   . Smokeless tobacco: Never Used  . Alcohol Use: No  . Drug Use: No  . Sexual Activity: No   Other Topics Concern  . 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 ABLE TO AMBULATE WITHIN HOME, UP STAIRS WITHOUT INCIDENT AT BASELINE.                 Family History  Problem Relation Age of Onset  . Stroke Mother   . Diabetes Mother   . Heart disease Mother     Unkown  . Stroke Father   . Diabetes Father   . Cancer Father     PROSTATE  . Cancer Brother     RENAL AND BLADDER  . Lung disease Brother   . Lung disease Sister   . Hypertension Daughter   . Hypertension Son     ROS-  All systems are reviewed and are negative except as outlined in the HPI above  Physical Exam: Filed Vitals:   07/28/13 1147  BP: 158/82  Pulse: 60  Height: 5\' 2"  (1.575 m)  Weight: 140 lb (63.504 kg)    GEN- The patient is elderly appearing, alert and oriented x 3 today.   Head- normocephalic, atraumatic Eyes-  Sclera clear, conjunctiva pink Ears- hearing intact Oropharynx- clear Neck- supple, Lungs- diffuse dry rales, normal work of breathing Heart- regular rate and rhythm,  GI- soft, NT, ND, + BS Extremities- no clubbing, cyanosis, or edema MS- diffuse atrophy Skin- no rash or lesion Psych- euthymic mood, full affect Neuro- strength and sensation are intact  Device interrogation today is reviewed (see paceArt) ekg is reviewed today  Assessment and Plan:  1. Persistent  atrial fibrillation Maintaining sinus rhythm  with tikosyn Check bmet and mg today Continue long term anticoagulation  2. Tachy/brady Normal pacemaker function See Pace Art report No changes today  3. Acute on chronic diastolic dysfunction Much improved with sinus rhythm  4. HTN Elevated today Given her recent improvements, I am afraid to make any sudden moves at this point She will continue to follow her BP at home and contact Dr Swaziland if it remains elevated.  Follow-up with Dr Hollie Salk in 6 weeks I will see again in 3 months

## 2013-07-28 NOTE — Patient Instructions (Signed)
Your physician recommends that you schedule a follow-up appointment in: 6 weeks with Norma Fredrickson, NP and 3 months with Dr Johney Frame

## 2013-07-29 ENCOUNTER — Encounter: Payer: Self-pay | Admitting: Internal Medicine

## 2013-09-08 ENCOUNTER — Encounter: Payer: Self-pay | Admitting: Nurse Practitioner

## 2013-09-08 ENCOUNTER — Encounter (INDEPENDENT_AMBULATORY_CARE_PROVIDER_SITE_OTHER): Payer: Self-pay

## 2013-09-08 ENCOUNTER — Ambulatory Visit (INDEPENDENT_AMBULATORY_CARE_PROVIDER_SITE_OTHER): Payer: Medicare Other | Admitting: Nurse Practitioner

## 2013-09-08 VITALS — BP 160/90 | HR 60 | Ht 63.0 in | Wt 142.8 lb

## 2013-09-08 DIAGNOSIS — I4891 Unspecified atrial fibrillation: Secondary | ICD-10-CM | POA: Diagnosis not present

## 2013-09-08 DIAGNOSIS — I5032 Chronic diastolic (congestive) heart failure: Secondary | ICD-10-CM | POA: Diagnosis not present

## 2013-09-08 MED ORDER — AMLODIPINE BESYLATE 5 MG PO TABS: 5.0000 mg | ORAL_TABLET | Freq: Every day | ORAL | Status: DC

## 2013-09-08 NOTE — Patient Instructions (Addendum)
Stay on your current medicines but restart the Norvasc at 5 mg each day - I sent this to [facility]  Continue to monitor your blood pressure at home   See Dr. Swaziland in April  See Dr. Johney Frame in February as planned  Call the [facility] office at [phone] if you have any questions, problems or concerns.

## 2013-09-08 NOTE — Progress Notes (Signed)
[name] Date of Birth: 05-11-1925 Medical Record #[id number]  History of Present Illness: Nohelani is seen back today for a follow up visit. Seen for Dr. Mena Goes. She has CHF, tachy brady with PPM in place, past cardioversions in October of 2012 and again in January of 2014. Recurrent atrial fib in June and was cardioverted and started on Multaq. Has been intolerant to amiodarone due to possible pulmonary toxicity. Then failed on Multaq due to recurrent arrhythmia. Tikosyn was considered but QT was felt to be too long - so tried Sotalol but this failed to hold her in sinus - so has been put on Tikosyn and has been able to maintain sinus rhythm.   Seen back in early November by Dr. Johney Frame - she remained in sinus. BP was up but she was left on her current regimen. Trying to avoid any "sudden moves" in her management given the nature of her issues/problems.   Comes back today. Here with her husband. Actually walking today and was not in a wheelchair. Doing ok. Husband agrees. She is stronger. Doing more. Not short of breath. Weight is stable. Only complaint is that her BP is too high. Readings at home up in the 180's. Was on Norvasc in the past - not clear as to why no longer on. She has this listed from her visit with Dr. Swaziland from August but it was not listed on her visit with Dr. Johney Frame in September. No documentation that I can see that says why stopped.    Current Outpatient Prescriptions  Medication Sig Dispense Refill  . BENICAR 40 MG tablet TAKE 1 TABLET DAILY  90 tablet  0  . Calcium Citrate (CITRACAL PO) Take 1 tablet by mouth 2 (two) times daily.       . cholecalciferol (VITAMIN D) 1000 UNITS tablet Take 1,000 Units by mouth daily.       . dabigatran (PRADAXA) 75 MG CAPS Take 1 capsule (75 mg total) by mouth every 12 (twelve) hours. Hold PM dose today 03/03/2013. Start 75 mg twice daily tomorrow 03/04/2013..  180 capsule  3  . divalproex (DEPAKOTE) 500 MG EC tablet Take 500 mg  by mouth at bedtime.        . dofetilide (TIKOSYN) 250 MCG capsule Take 1 capsule (250 mcg total) by mouth 2 (two) times daily.  60 capsule  11  . esomeprazole (NEXIUM) 20 MG capsule Take 40 mg by mouth daily before breakfast.      . furosemide (LASIX) 40 MG tablet TAKE 1 TABLET DAILY  90 tablet  0  . HYDROcodone-acetaminophen (VICODIN) 5-500 MG per tablet Take 1 tablet by mouth every 6 (six) hours as needed. For pain       . KLOR-CON M10 10 MEQ tablet TAKE 1 TABLET TWICE A DAY  180 tablet  0  . metoprolol tartrate (LOPRESSOR) 25 MG tablet Take 1 tablet (25 mg total) by mouth 2 (two) times daily.  180 tablet  4  . Multiple Vitamins-Minerals (MULTIVITAMINS THER. W/MINERALS) TABS Take 1 tablet by mouth daily.        . polyethylene glycol powder (GLYCOLAX/MIRALAX) powder Take 17 g by mouth daily as needed. For constipation      . timolol (BETIMOL) 0.5 % ophthalmic solution Place 1 drop into both eyes daily.       Marland Kitchen tolterodine (DETROL LA) 4 MG 24 hr capsule Take 4 mg by mouth every evening.       . travoprost, benzalkonium, (TRAVATAN) 0.004 %  ophthalmic solution Place 1 drop into the left eye at bedtime.        No current facility-administered medications for this visit.    Allergies  Allergen Reactions  . Amiodarone     Possibly caused pneumonitis    Past Medical History  Diagnosis Date  . Pneumonia 04/2010  . Hypertension   . Renal artery stenosis 2008    STATUS POST ANGIOPLASTY  . GERD (gastroesophageal reflux disease)   . Back pain     CHRONIC  . Atrial fibrillation     Prior TEE/CV in May 2012; managed with rate control and anticoagulation  . Hyponatremia     improved  . Seizure disorder     on anti-epileptic therapy  . Urinary incontinence   . Osteopenia   . SIADH (syndrome of inappropriate ADH production)     h/o HYPONATREMIA ; LIKELY RELATED TO HER LUNG PROCESS  . Arthritis   . Hiatal hernia   . Tachy-brady syndrome     with pauses noted in May 2012; s/p PTVP October  2012  . Dysphagia   . (HFpEF) heart failure with preserved ejection fraction   . Pacemaker     Past Surgical History  Procedure Laterality Date  . Cholecystectomy    . Back surgery      X2  . Total knee arthroplasty      RIGHT AND LEFT KNEE  . Tonsillectomy    . Appendectomy    . Cardiac catheterization  2007    Normal  . Knee arthroscopy      LEFT KNEE  . Cataract extraction    . Shoulder surgery    . Renal angioplasty  01/2007    RIGHT  . Partial hysterectomy    . Pacemaker insertion  06/2011    SJM Accent DR RF implanted by Dr Johney Frame  . Eye surgery      cataracts removed on both eyes  . Cardioversion    . Cardioversion  10/14/2012    Procedure: CARDIOVERSION;  Surgeon: Vesta Mixer, MD;  Location: [facility] ENDOSCOPY;  Service: Cardiovascular;  Laterality: N/A;  . Cardioversion N/A 05/30/2013    Procedure: CARDIOVERSION;  Surgeon: Hillis Range, MD;  Location: [facility] OR;  Service: Cardiovascular;  Laterality: N/A;    History  Smoking status  . Never Smoker   Smokeless tobacco  . Never Used    History  Alcohol Use No    Family History  Problem Relation Age of Onset  . Stroke Mother   . Diabetes Mother   . Heart disease Mother     Unkown  . Stroke Father   . Diabetes Father   . Cancer Father     PROSTATE  . Cancer Brother     RENAL AND BLADDER  . Lung disease Brother   . Lung disease Sister   . Hypertension Daughter   . Hypertension Son     Review of Systems: The review of systems is per the HPI.  All other systems were reviewed and are negative.  Physical Exam: BP 160/90  Pulse 60  Ht 5\' 3"  (1.6 m)  Wt 142 lb 12.8 oz (64.774 kg)  BMI 25.30 kg/m2  SpO2 97% Patient is very pleasant and in no acute distress. Looks stronger. Skin is warm and dry. Color is normal.  HEENT is unremarkable. Normocephalic/atraumatic. PERRL. Sclera are nonicteric. Neck is supple. No masses. No JVD. Lungs are clear. Cardiac exam shows a regular rate and rhythm. Abdomen is soft.  Extremities are  without edema. Gait and ROM are intact. No gross neurologic deficits noted.  LABORATORY DATA:  Lab Results  Component Value Date   WBC 5.3 05/28/2013   HGB 12.3 05/28/2013   HCT 36.2 05/28/2013   PLT 167 05/28/2013   GLUCOSE 86 07/28/2013   ALT 26 11/23/2011   AST 31 11/23/2011   NA 131* 07/28/2013   K 4.5 07/28/2013   CL 91* 07/28/2013   CREATININE 0.8 07/28/2013   BUN 14 07/28/2013   CO2 30 07/28/2013   TSH 1.25 05/08/2013   INR 1.69* 03/02/2013    Assessment / Plan: 1. PAF - now on Tikosyn - rhythm has been stable.   2. Tachy/brady - with PPM in place - followed by Dr. Johney Frame  3. HTN - BP too high per home readings - restart her Norvasc back at 5 mg a day. They will continue to monitor at home.   4. Chronic diastolic HF - looks compensated.   5. Advanced age   She is seeing Dr. Johney Frame in February. Will have her see Dr. Swaziland in April.   Patient is agreeable to this plan and will call if any problems develop in the interim.   Rosalio Macadamia, RN, ANP-C [facility] [address] [place], [place]  [address]

## 2013-09-30 DIAGNOSIS — M48061 Spinal stenosis, lumbar region without neurogenic claudication: Secondary | ICD-10-CM | POA: Diagnosis not present

## 2013-09-30 DIAGNOSIS — M545 Low back pain, unspecified: Secondary | ICD-10-CM | POA: Diagnosis not present

## 2013-10-06 ENCOUNTER — Telehealth: Payer: Self-pay | Admitting: Internal Medicine

## 2013-10-06 ENCOUNTER — Other Ambulatory Visit: Payer: Self-pay | Admitting: *Deleted

## 2013-10-06 MED ORDER — METOPROLOL TARTRATE 25 MG PO TABS: 25.0000 mg | ORAL_TABLET | Freq: Two times a day (BID) | ORAL | Status: DC

## 2013-10-17 ENCOUNTER — Encounter: Payer: Self-pay | Admitting: Cardiovascular Disease

## 2013-10-17 ENCOUNTER — Other Ambulatory Visit: Payer: Self-pay | Admitting: Internal Medicine

## 2013-10-17 DIAGNOSIS — Z6826 Body mass index (BMI) 26.0-26.9, adult: Secondary | ICD-10-CM | POA: Diagnosis not present

## 2013-10-17 DIAGNOSIS — I10 Essential (primary) hypertension: Secondary | ICD-10-CM | POA: Diagnosis not present

## 2013-10-17 DIAGNOSIS — Z950 Presence of cardiac pacemaker: Secondary | ICD-10-CM | POA: Diagnosis not present

## 2013-10-17 DIAGNOSIS — I4891 Unspecified atrial fibrillation: Secondary | ICD-10-CM | POA: Diagnosis not present

## 2013-10-17 DIAGNOSIS — J8410 Pulmonary fibrosis, unspecified: Secondary | ICD-10-CM | POA: Diagnosis not present

## 2013-10-29 ENCOUNTER — Ambulatory Visit (INDEPENDENT_AMBULATORY_CARE_PROVIDER_SITE_OTHER): Payer: Medicare Other | Admitting: Internal Medicine

## 2013-10-29 ENCOUNTER — Encounter: Payer: Self-pay | Admitting: Internal Medicine

## 2013-10-29 VITALS — BP 142/84 | HR 60 | Ht 63.0 in | Wt 146.0 lb

## 2013-10-29 DIAGNOSIS — I10 Essential (primary) hypertension: Secondary | ICD-10-CM

## 2013-10-29 DIAGNOSIS — L570 Actinic keratosis: Secondary | ICD-10-CM | POA: Diagnosis not present

## 2013-10-29 DIAGNOSIS — Z950 Presence of cardiac pacemaker: Secondary | ICD-10-CM

## 2013-10-29 DIAGNOSIS — Z85828 Personal history of other malignant neoplasm of skin: Secondary | ICD-10-CM | POA: Diagnosis not present

## 2013-10-29 DIAGNOSIS — C44721 Squamous cell carcinoma of skin of unspecified lower limb, including hip: Secondary | ICD-10-CM | POA: Diagnosis not present

## 2013-10-29 DIAGNOSIS — I5032 Chronic diastolic (congestive) heart failure: Secondary | ICD-10-CM | POA: Diagnosis not present

## 2013-10-29 DIAGNOSIS — D485 Neoplasm of uncertain behavior of skin: Secondary | ICD-10-CM | POA: Diagnosis not present

## 2013-10-29 DIAGNOSIS — I495 Sick sinus syndrome: Secondary | ICD-10-CM

## 2013-10-29 DIAGNOSIS — L821 Other seborrheic keratosis: Secondary | ICD-10-CM | POA: Diagnosis not present

## 2013-10-29 DIAGNOSIS — I4891 Unspecified atrial fibrillation: Secondary | ICD-10-CM | POA: Diagnosis not present

## 2013-10-29 DIAGNOSIS — I509 Heart failure, unspecified: Secondary | ICD-10-CM

## 2013-10-29 DIAGNOSIS — L819 Disorder of pigmentation, unspecified: Secondary | ICD-10-CM | POA: Diagnosis not present

## 2013-10-29 LAB — MDC_IDC_ENUM_SESS_TYPE_INCLINIC
Brady Statistic RV Percent Paced: 2.5 %
Date Time Interrogation Session: 20150211111534
Implantable Pulse Generator Model: 2210
Implantable Pulse Generator Serial Number: 7280874
Lead Channel Impedance Value: 412.5 Ohm
Lead Channel Impedance Value: 612.5 Ohm
Lead Channel Pacing Threshold Amplitude: 0.5 V
Lead Channel Pacing Threshold Amplitude: 1 V
Lead Channel Pacing Threshold Pulse Width: 0.8 ms
Lead Channel Sensing Intrinsic Amplitude: 1.4 mV
Lead Channel Sensing Intrinsic Amplitude: 12 mV
Lead Channel Setting Pacing Amplitude: 2.5 V
Lead Channel Setting Pacing Pulse Width: 0.5 ms
MDC IDC MSMT BATTERY REMAINING LONGEVITY: 80.4 mo
MDC IDC MSMT BATTERY VOLTAGE: 2.95 V
MDC IDC MSMT LEADCHNL RV PACING THRESHOLD PULSEWIDTH: 0.5 ms
MDC IDC SET LEADCHNL RA PACING AMPLITUDE: 2 V
MDC IDC SET LEADCHNL RV SENSING SENSITIVITY: 2 mV
MDC IDC STAT BRADY RA PERCENT PACED: 93 %

## 2013-10-29 NOTE — Progress Notes (Signed)
PCP:  Janalyn Rouse, MD Primary Cardiologist:  Dr Martinique  The patient presents today for electrophysiology followup. She is doing much better with sinus rhythm.  She is maintaining sinus with tikosyn.  She is very pleased with her present health state. Today, she denies symptoms of palpitations, chest pain,  dizziness, presyncope, syncope, or neurologic sequela.  Her edema is stable.  The patient feels that she is tolerating medications without difficulties and is otherwise without complaint today.   Past Medical History  Diagnosis Date  . Pneumonia 04/2010  . Hypertension   . Renal artery stenosis 2008    STATUS POST ANGIOPLASTY  . GERD (gastroesophageal reflux disease)   . Back pain     CHRONIC  . Atrial fibrillation     Prior TEE/CV in May 2012; managed with rate control and anticoagulation  . Hyponatremia     improved  . Seizure disorder     on anti-epileptic therapy  . Urinary incontinence   . Osteopenia   . SIADH (syndrome of inappropriate ADH production)     h/o HYPONATREMIA ; LIKELY RELATED TO HER LUNG PROCESS  . Arthritis   . Hiatal hernia   . Tachy-brady syndrome     with pauses noted in May 2012; s/p PTVP October 2012  . Dysphagia   . (HFpEF) heart failure with preserved ejection fraction   . Pacemaker    Past Surgical History  Procedure Laterality Date  . Cholecystectomy    . Back surgery      X2  . Total knee arthroplasty      RIGHT AND LEFT KNEE  . Tonsillectomy    . Appendectomy    . Cardiac catheterization  2007    Normal  . Knee arthroscopy      LEFT KNEE  . Cataract extraction    . Shoulder surgery    . Renal angioplasty  01/2007    RIGHT  . Partial hysterectomy    . Pacemaker insertion  06/2011    SJM Accent DR RF implanted by Dr Rayann Heman  . Eye surgery      cataracts removed on both eyes  . Cardioversion    . Cardioversion  10/14/2012    Procedure: CARDIOVERSION;  Surgeon: Thayer Headings, MD;  Location: [place];  Service:  Cardiovascular;  Laterality: N/A;  . Cardioversion N/A 05/30/2013    Procedure: CARDIOVERSION;  Surgeon: Thompson Grayer, MD;  Location: [facility] OR;  Service: Cardiovascular;  Laterality: N/A;    Current Outpatient Prescriptions  Medication Sig Dispense Refill  . amLODipine (NORVASC) 5 MG tablet Take 1 tablet (5 mg total) by mouth daily.  180 tablet  3  . BENICAR 40 MG tablet TAKE 1 TABLET DAILY  90 tablet  0  . Calcium Citrate (CITRACAL PO) Take 1 tablet by mouth 2 (two) times daily.       . Cholecalciferol (VITAMIN D) 400 UNITS capsule Take 400 Units by mouth 3 (three) times daily.      . dabigatran (PRADAXA) 75 MG CAPS capsule Take 75 mg by mouth every 12 (twelve) hours.      . divalproex (DEPAKOTE) 500 MG EC tablet Take 500 mg by mouth at bedtime.        . dofetilide (TIKOSYN) 250 MCG capsule Take 1 capsule (250 mcg total) by mouth 2 (two) times daily.  60 capsule  11  . esomeprazole (NEXIUM) 20 MG capsule Take 40 mg by mouth daily before breakfast.      . furosemide (  LASIX) 40 MG tablet TAKE 1 TABLET DAILY  90 tablet  0  . HYDROcodone-acetaminophen (VICODIN) 5-500 MG per tablet Take 1 tablet by mouth every 6 (six) hours as needed. For pain       . KLOR-CON M10 10 MEQ tablet TAKE 1 TABLET TWICE A DAY  180 tablet  0  . metoprolol tartrate (LOPRESSOR) 25 MG tablet Take 1 tablet (25 mg total) by mouth 2 (two) times daily.  180 tablet  0  . Multiple Vitamins-Minerals (MULTIVITAMINS THER. W/MINERALS) TABS Take 1 tablet by mouth daily.        . polyethylene glycol powder (GLYCOLAX/MIRALAX) powder Take 17 g by mouth daily as needed. For constipation      . timolol (BETIMOL) 0.5 % ophthalmic solution Place 1 drop into both eyes 2 (two) times daily.       Marland Kitchen tolterodine (DETROL LA) 4 MG 24 hr capsule Take 4 mg by mouth every evening.       . travoprost, benzalkonium, (TRAVATAN) 0.004 % ophthalmic solution Place 1 drop into the left eye at bedtime.        No current facility-administered medications for  this visit.    Allergies  Allergen Reactions  . Amiodarone     Possibly caused pneumonitis    History   Social History  . Marital Status: Married    Spouse Name: N/A    Number of Children: 3  . Years of Education: 10   Occupational History  . [employer]   Social History Main Topics  . Smoking status: Never Smoker   . Smokeless tobacco: Never Used  . Alcohol Use: No  . Drug Use: No  . Sexual Activity: No   Other Topics Concern  . 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 ABLE TO AMBULATE WITHIN HOME, UP STAIRS WITHOUT INCIDENT AT BASELINE.                 Family History  Problem Relation Age of Onset  . Stroke Mother   . Diabetes Mother   . Heart disease Mother     Unkown  . Stroke Father   . Diabetes Father   . Cancer Father     PROSTATE  . Cancer Brother     RENAL AND BLADDER  . Lung disease Brother   . Lung disease Sister   . Hypertension Daughter   . Hypertension Son      Physical Exam: Filed Vitals:   10/29/13 1047  BP: 142/84  Pulse: 60  Height: 5\' 3"  (1.6 m)  Weight: 146 lb (66.225 kg)    GEN- The patient is elderly appearing, alert and oriented x 3 today.   Head- normocephalic, atraumatic Eyes-  Sclera clear, conjunctiva pink Ears- hearing intact Oropharynx- clear Neck- supple, Lungs- diffuse dry rales, normal work of breathing Heart- regular rate and rhythm,  GI- soft, NT, ND, + BS Extremities- no clubbing, cyanosis, or edema Neuro- strength and sensation are intact  Device interrogation today is reviewed (see paceArt) ekg is reviewed today and reveals atrial pacing at 60 bpm, PR 368, septal infarct  Assessment and Plan:  1. Persistent atrial fibrillation Maintaining sinus rhythm  with [facility] from 11/14 are reviewed.  She will need repeat labs when she sees Dr Martinique in April. Continue long term anticoagulation  2. Tachy/brady Normal pacemaker function See Pace Art report Rate response increased today  3. Chronic diastolic dysfunction Much improved with sinus rhythm  4. HTN Stable No change required today  Follow-up with Dr Martinique as scheduled Merlin I will see again in 12 months

## 2013-10-29 NOTE — Patient Instructions (Signed)
Your physician wants you to follow-up in: 12 months with Dr Allred  You will receive a reminder letter in the mail two months in advance. If you don't receive a letter, please call our office to schedule the follow-up appointment.   Remote monitoring is used to monitor your Pacemaker or ICD from home. This monitoring reduces the number of office visits required to check your device to one time per year. It allows us to keep an eye on the functioning of your device to ensure it is working properly. You are scheduled for a device check from home on 02/02/14. You may send your transmission at any time that day. If you have a wireless device, the transmission will be sent automatically. After your physician reviews your transmission, you will receive a postcard with your next transmission date.    

## 2013-11-03 ENCOUNTER — Other Ambulatory Visit: Payer: Self-pay | Admitting: Internal Medicine

## 2013-11-06 ENCOUNTER — Other Ambulatory Visit: Payer: Self-pay | Admitting: Internal Medicine

## 2013-11-13 ENCOUNTER — Ambulatory Visit: Payer: Self-pay | Admitting: Podiatrist

## 2013-12-05 DIAGNOSIS — H401490 Capsular glaucoma with pseudoexfoliation of lens, unspecified eye, stage unspecified: Secondary | ICD-10-CM | POA: Diagnosis not present

## 2013-12-05 DIAGNOSIS — H268 Other specified cataract: Secondary | ICD-10-CM | POA: Diagnosis not present

## 2013-12-05 DIAGNOSIS — Z961 Presence of intraocular lens: Secondary | ICD-10-CM | POA: Diagnosis not present

## 2013-12-11 ENCOUNTER — Ambulatory Visit (INDEPENDENT_AMBULATORY_CARE_PROVIDER_SITE_OTHER): Payer: Medicare Other | Admitting: Podiatrist

## 2013-12-11 ENCOUNTER — Encounter: Payer: Self-pay | Admitting: Podiatrist

## 2013-12-11 VITALS — BP 134/71 | HR 74 | Resp 16 | Ht 62.0 in | Wt 160.0 lb

## 2013-12-11 DIAGNOSIS — B351 Tinea unguium: Secondary | ICD-10-CM

## 2013-12-11 DIAGNOSIS — M79609 Pain in unspecified limb: Secondary | ICD-10-CM | POA: Diagnosis not present

## 2013-12-11 NOTE — Progress Notes (Signed)
HPI:  Patient presents today for follow up of foot and nail care. Denies any new complaints today.  Objective:  Patients chart is reviewed.  Vascular status reveals pedal pulses noted at 1 out of 4 dp and pt bilateral .  Neurological sensation is Normal to Semmes Weinstein monofilament bilateral.  Patients nails are thickened, discolored, distrophic, friable and brittle with yellow-brown discoloration. Patient subjectively relates they are painful with shoes and with ambulation of bilateral feet.  Assessment:  Symptomatic onychomycosis  Plan:  Discussed treatment options and alternatives.  The symptomatic toenails were debrided through manual an mechanical means without complication.  Return appointment recommended at routine intervals of 3 months    Kathryn Egerton, DPM   

## 2013-12-16 ENCOUNTER — Other Ambulatory Visit: Payer: Self-pay | Admitting: Internal Medicine

## 2013-12-16 DIAGNOSIS — M899 Disorder of bone, unspecified: Secondary | ICD-10-CM | POA: Diagnosis not present

## 2013-12-16 DIAGNOSIS — M949 Disorder of cartilage, unspecified: Secondary | ICD-10-CM | POA: Diagnosis not present

## 2013-12-18 ENCOUNTER — Other Ambulatory Visit: Payer: Self-pay

## 2013-12-18 DIAGNOSIS — Z1231 Encounter for screening mammogram for malignant neoplasm of breast: Secondary | ICD-10-CM

## 2013-12-28 ENCOUNTER — Other Ambulatory Visit: Payer: Self-pay | Admitting: Internal Medicine

## 2013-12-29 ENCOUNTER — Other Ambulatory Visit: Payer: Self-pay

## 2014-01-13 ENCOUNTER — Ambulatory Visit
Admission: RE | Admit: 2014-01-13 | Discharge: 2014-01-13 | Disposition: A | Discharge status: Home / Self Care | Payer: Medicare Other | Source: Ambulatory Visit

## 2014-01-13 DIAGNOSIS — Z1231 Encounter for screening mammogram for malignant neoplasm of breast: Secondary | ICD-10-CM

## 2014-01-15 ENCOUNTER — Encounter: Payer: Self-pay | Admitting: Cardiology

## 2014-01-15 DIAGNOSIS — M899 Disorder of bone, unspecified: Secondary | ICD-10-CM | POA: Diagnosis not present

## 2014-01-15 DIAGNOSIS — Z79899 Other long term (current) drug therapy: Secondary | ICD-10-CM | POA: Diagnosis not present

## 2014-01-15 DIAGNOSIS — M949 Disorder of cartilage, unspecified: Secondary | ICD-10-CM | POA: Diagnosis not present

## 2014-01-15 DIAGNOSIS — K219 Gastro-esophageal reflux disease without esophagitis: Secondary | ICD-10-CM | POA: Diagnosis not present

## 2014-01-20 ENCOUNTER — Encounter: Payer: Self-pay | Admitting: Cardiology

## 2014-01-20 DIAGNOSIS — G3184 Mild cognitive impairment, so stated: Secondary | ICD-10-CM | POA: Diagnosis not present

## 2014-01-20 DIAGNOSIS — R569 Unspecified convulsions: Secondary | ICD-10-CM | POA: Diagnosis not present

## 2014-01-20 DIAGNOSIS — R5381 Other malaise: Secondary | ICD-10-CM | POA: Diagnosis not present

## 2014-01-20 DIAGNOSIS — I10 Essential (primary) hypertension: Secondary | ICD-10-CM | POA: Diagnosis not present

## 2014-01-20 DIAGNOSIS — I4891 Unspecified atrial fibrillation: Secondary | ICD-10-CM | POA: Diagnosis not present

## 2014-01-20 DIAGNOSIS — Z950 Presence of cardiac pacemaker: Secondary | ICD-10-CM | POA: Diagnosis not present

## 2014-01-20 DIAGNOSIS — J8410 Pulmonary fibrosis, unspecified: Secondary | ICD-10-CM | POA: Diagnosis not present

## 2014-01-20 DIAGNOSIS — Z6827 Body mass index (BMI) 27.0-27.9, adult: Secondary | ICD-10-CM | POA: Diagnosis not present

## 2014-01-20 DIAGNOSIS — I519 Heart disease, unspecified: Secondary | ICD-10-CM | POA: Diagnosis not present

## 2014-02-02 ENCOUNTER — Encounter: Payer: Self-pay | Admitting: Internal Medicine

## 2014-02-02 ENCOUNTER — Ambulatory Visit (INDEPENDENT_AMBULATORY_CARE_PROVIDER_SITE_OTHER): Payer: Medicare Other | Admitting: *Deleted

## 2014-02-02 DIAGNOSIS — Z950 Presence of cardiac pacemaker: Secondary | ICD-10-CM | POA: Diagnosis not present

## 2014-02-02 DIAGNOSIS — I4891 Unspecified atrial fibrillation: Secondary | ICD-10-CM | POA: Diagnosis not present

## 2014-02-02 NOTE — Progress Notes (Signed)
Remote pacemaker transmission.   

## 2014-02-05 LAB — MDC_IDC_ENUM_SESS_TYPE_REMOTE
Battery Remaining Longevity: 73 mo
Battery Voltage: 2.95 V
Brady Statistic AP VP Percent: 12 %
Brady Statistic AP VS Percent: 84 %
Brady Statistic AS VP Percent: 1 %
Date Time Interrogation Session: 20150518060926
Lead Channel Impedance Value: 610 Ohm
Lead Channel Pacing Threshold Amplitude: 0.5 V
Lead Channel Pacing Threshold Pulse Width: 0.5 ms
Lead Channel Sensing Intrinsic Amplitude: 1.4 mV
Lead Channel Sensing Intrinsic Amplitude: 12 mV
Lead Channel Setting Pacing Amplitude: 2 V
Lead Channel Setting Pacing Amplitude: 2.5 V
Lead Channel Setting Pacing Pulse Width: 0.5 ms
Lead Channel Setting Sensing Sensitivity: 2 mV
MDC IDC MSMT BATTERY REMAINING PERCENTAGE: 73 %
MDC IDC MSMT LEADCHNL RA IMPEDANCE VALUE: 410 Ohm
MDC IDC MSMT LEADCHNL RA PACING THRESHOLD AMPLITUDE: 1 V
MDC IDC MSMT LEADCHNL RA PACING THRESHOLD PULSEWIDTH: 0.8 ms
MDC IDC PG SERIAL: 7280874
MDC IDC STAT BRADY AS VS PERCENT: 1.4 %
MDC IDC STAT BRADY RA PERCENT PACED: 88 %
MDC IDC STAT BRADY RV PERCENT PACED: 14 %

## 2014-02-06 ENCOUNTER — Other Ambulatory Visit: Payer: Self-pay | Admitting: Nurse Practitioner

## 2014-02-10 ENCOUNTER — Other Ambulatory Visit (HOSPITAL_COMMUNITY): Payer: Self-pay | Admitting: Internal Medicine

## 2014-02-10 ENCOUNTER — Ambulatory Visit (HOSPITAL_COMMUNITY)
Admission: RE | Admit: 2014-02-10 | Discharge: 2014-02-10 | Disposition: A | Discharge status: Home / Self Care | Payer: Medicare Other | Source: Ambulatory Visit | Attending: Internal Medicine | Admitting: Internal Medicine

## 2014-02-10 ENCOUNTER — Encounter (HOSPITAL_COMMUNITY): Payer: Self-pay

## 2014-02-10 DIAGNOSIS — M810 Age-related osteoporosis without current pathological fracture: Secondary | ICD-10-CM | POA: Diagnosis not present

## 2014-02-10 MED ORDER — ZOLEDRONIC ACID 5 MG/100ML IV SOLN
5.0000 mg | Freq: Once | INTRAVENOUS | Status: AC
  Administered 2014-02-10: 5 mg via INTRAVENOUS
  Filled 2014-02-10: qty 100

## 2014-02-10 MED ORDER — SODIUM CHLORIDE 0.9 % IV SOLN
Freq: Once | INTRAVENOUS | Status: AC
  Administered 2014-02-10: 14:00:00 via INTRAVENOUS

## 2014-02-10 NOTE — Discharge Instructions (Signed)

## 2014-02-11 ENCOUNTER — Other Ambulatory Visit: Payer: Self-pay | Admitting: *Deleted

## 2014-02-11 MED ORDER — DABIGATRAN ETEXILATE MESYLATE 75 MG PO CAPS: 75.0000 mg | ORAL_CAPSULE | Freq: Two times a day (BID) | ORAL | Status: DC

## 2014-02-13 ENCOUNTER — Encounter: Payer: Self-pay | Admitting: Cardiology

## 2014-03-06 DIAGNOSIS — L821 Other seborrheic keratosis: Secondary | ICD-10-CM | POA: Diagnosis not present

## 2014-03-06 DIAGNOSIS — Z85828 Personal history of other malignant neoplasm of skin: Secondary | ICD-10-CM | POA: Diagnosis not present

## 2014-03-06 DIAGNOSIS — L570 Actinic keratosis: Secondary | ICD-10-CM | POA: Diagnosis not present

## 2014-03-06 DIAGNOSIS — L819 Disorder of pigmentation, unspecified: Secondary | ICD-10-CM | POA: Diagnosis not present

## 2014-03-12 ENCOUNTER — Ambulatory Visit (INDEPENDENT_AMBULATORY_CARE_PROVIDER_SITE_OTHER): Payer: Medicare Other | Admitting: Podiatrist

## 2014-03-12 DIAGNOSIS — B351 Tinea unguium: Secondary | ICD-10-CM | POA: Diagnosis not present

## 2014-03-12 DIAGNOSIS — M79609 Pain in unspecified limb: Secondary | ICD-10-CM | POA: Diagnosis not present

## 2014-03-12 DIAGNOSIS — M79673 Pain in unspecified foot: Secondary | ICD-10-CM

## 2014-03-12 NOTE — Progress Notes (Signed)
HPI: Patient presents today for follow up of foot and nail care. Denies any new complaints today.  Objective: Patients chart is reviewed. Vascular status reveals pedal pulses noted at 1 out of 4 dp and pt bilateral . Neurological sensation is Normal to Semmes Weinstein monofilament bilateral. Patients nails are thickened, discolored, distrophic, friable and brittle with yellow-brown discoloration. Patient subjectively relates they are painful with shoes and with ambulation of bilateral feet.  Assessment: Symptomatic onychomycosis  Plan: Discussed treatment options and alternatives. The symptomatic toenails were debrided through manual an mechanical means without complication. Return appointment recommended at routine intervals of 3 months   

## 2014-03-13 ENCOUNTER — Encounter: Payer: Self-pay | Admitting: Cardiology

## 2014-03-13 ENCOUNTER — Ambulatory Visit (INDEPENDENT_AMBULATORY_CARE_PROVIDER_SITE_OTHER): Payer: Medicare Other | Admitting: Cardiology

## 2014-03-13 ENCOUNTER — Encounter (INDEPENDENT_AMBULATORY_CARE_PROVIDER_SITE_OTHER): Payer: Self-pay

## 2014-03-13 VITALS — BP 135/80 | HR 82 | Ht 62.0 in | Wt 143.0 lb

## 2014-03-13 DIAGNOSIS — I509 Heart failure, unspecified: Secondary | ICD-10-CM

## 2014-03-13 DIAGNOSIS — I4891 Unspecified atrial fibrillation: Secondary | ICD-10-CM | POA: Diagnosis not present

## 2014-03-13 DIAGNOSIS — I10 Essential (primary) hypertension: Secondary | ICD-10-CM | POA: Diagnosis not present

## 2014-03-13 DIAGNOSIS — I495 Sick sinus syndrome: Secondary | ICD-10-CM | POA: Diagnosis not present

## 2014-03-13 DIAGNOSIS — I5032 Chronic diastolic (congestive) heart failure: Secondary | ICD-10-CM

## 2014-03-13 DIAGNOSIS — I480 Paroxysmal atrial fibrillation: Secondary | ICD-10-CM

## 2014-03-13 DIAGNOSIS — Z950 Presence of cardiac pacemaker: Secondary | ICD-10-CM

## 2014-03-13 NOTE — Progress Notes (Signed)
[name] Date of Birth: 07-Feb-1925 Medical Record #[id number]  History of Present Illness: [name] is seen back today for a follow up visit.  She has CHF, tachy brady with PPM in place, past cardioversions in October of 2012 and again in January of 2014. Recurrent atrial fib in June and was cardioverted and started on Multaq. Has been intolerant to amiodarone due to possible pulmonary toxicity. Then failed on Multaq due to recurrent arrhythmia. Has been put on Tikosyn and has been able to maintain sinus rhythm.   Comes back today. Here with her husband. Complains of lack of energy but otherwise is doing well.  Not short of breath. Weight is stable. BP well controlled. Had lab work with Dr. Brigitte Pulse 2 months ago. Pacemaker check in May showed mode switches 5% of the time with good rate control.    Current Outpatient Prescriptions  Medication Sig Dispense Refill  . amLODipine (NORVASC) 5 MG tablet Take 1 tablet (5 mg total) by mouth daily.  180 tablet  3  . BENICAR 40 MG tablet TAKE 1 TABLET DAILY  90 tablet  1  . Calcium Citrate (CITRACAL PO) Take 1 tablet by mouth 2 (two) times daily.       . Cholecalciferol (VITAMIN D) 400 UNITS capsule Take 400 Units by mouth 3 (three) times daily.      . dabigatran (PRADAXA) 75 MG CAPS capsule Take 1 capsule (75 mg total) by mouth every 12 (twelve) hours.  180 capsule  1  . divalproex (DEPAKOTE) 500 MG EC tablet Take 500 mg by mouth at bedtime.        . dofetilide (TIKOSYN) 250 MCG capsule Take 1 capsule (250 mcg total) by mouth 2 (two) times daily.  60 capsule  11  . esomeprazole (NEXIUM) 20 MG capsule Take 40 mg by mouth daily before breakfast.      . furosemide (LASIX) 40 MG tablet TAKE 1 TABLET DAILY  90 tablet  1  . HYDROcodone-acetaminophen (VICODIN) 5-500 MG per tablet Take 1 tablet by mouth every 6 (six) hours as needed. For pain       . KLOR-CON M10 10 MEQ tablet TAKE 1 TABLET TWICE A DAY  180 tablet  3  . metoprolol tartrate (LOPRESSOR) 25 MG  tablet TAKE 1 TABLET TWICE A DAY  180 tablet  2  . Multiple Vitamins-Minerals (MULTIVITAMINS THER. W/MINERALS) TABS Take 1 tablet by mouth daily.        . polyethylene glycol powder (GLYCOLAX/MIRALAX) powder Take 17 g by mouth daily as needed. For constipation      . timolol (BETIMOL) 0.5 % ophthalmic solution Place 1 drop into both eyes 2 (two) times daily.       Marland Kitchen tolterodine (DETROL LA) 4 MG 24 hr capsule Take 4 mg by mouth every evening.       . travoprost, benzalkonium, (TRAVATAN) 0.004 % ophthalmic solution Place 1 drop into the left eye at bedtime.        No current facility-administered medications for this visit.    Allergies  Allergen Reactions  . Amiodarone     Possibly caused pneumonitis    Past Medical History  Diagnosis Date  . Pneumonia 04/2010  . Hypertension   . Renal artery stenosis 2008    STATUS POST ANGIOPLASTY  . GERD (gastroesophageal reflux disease)   . Back pain     CHRONIC  . Atrial fibrillation     Prior TEE/CV in May 2012; managed with rate control and  anticoagulation  . Hyponatremia     improved  . Seizure disorder     on anti-epileptic therapy  . Urinary incontinence   . Osteopenia   . SIADH (syndrome of inappropriate ADH production)     h/o HYPONATREMIA ; LIKELY RELATED TO HER LUNG PROCESS  . Arthritis   . Hiatal hernia   . Tachy-brady syndrome     with pauses noted in May 2012; s/p PTVP October 2012  . Dysphagia   . (HFpEF) heart failure with preserved ejection fraction   . Pacemaker     Past Surgical History  Procedure Laterality Date  . Cholecystectomy    . Back surgery      X2  . Total knee arthroplasty      RIGHT AND LEFT KNEE  . Tonsillectomy    . Appendectomy    . Cardiac catheterization  2007    Normal  . Knee arthroscopy      LEFT KNEE  . Cataract extraction    . Shoulder surgery    . Renal angioplasty  01/2007    RIGHT  . Partial hysterectomy    . Pacemaker insertion  06/2011    SJM [employer] DR RF implanted by Dr  Rayann Heman  . Eye surgery      cataracts removed on both eyes  . Cardioversion    . Cardioversion  10/14/2012    Procedure: CARDIOVERSION;  Surgeon: Thayer Headings, MD;  Location: [place];  Service: Cardiovascular;  Laterality: N/A;  . Cardioversion N/A 05/30/2013    Procedure: CARDIOVERSION;  Surgeon: Thompson Grayer, MD;  Location: [facility] OR;  Service: Cardiovascular;  Laterality: N/A;    History  Smoking status  . Never Smoker   Smokeless tobacco  . Never Used    History  Alcohol Use No    Family History  Problem Relation Age of Onset  . Stroke Mother   . Diabetes Mother   . Heart disease Mother     Unkown  . Stroke Father   . Diabetes Father   . Cancer Father     PROSTATE  . Cancer Brother     RENAL AND BLADDER  . Lung disease Brother   . Lung disease Sister   . Hypertension Daughter   . Hypertension Son     Review of Systems: The review of systems is per the HPI.  All other systems were reviewed and are negative.  Physical Exam: BP 135/80  Pulse 82  Ht 5\' 2"  (1.575 m)  Wt 143 lb (64.864 kg)  BMI 26.15 kg/m2 Patient is very pleasant and in no acute distress. Looks stronger. Skin is warm and dry. Color is normal.  HEENT is unremarkable. Normocephalic/atraumatic. PERRL. Sclera are nonicteric. Neck is supple. No masses. No JVD. Lungs are clear. Cardiac exam shows a regular rate and rhythm. Normal S1-2. No gallop or murmur. Abdomen is soft. Extremities are without edema. Gait and ROM are intact. No gross neurologic deficits noted.  LABORATORY DATA:  Lab Results  Component Value Date   WBC 5.3 05/28/2013   HGB 12.3 05/28/2013   HCT 36.2 05/28/2013   PLT 167 05/28/2013   GLUCOSE 86 07/28/2013   ALT 26 11/23/2011   AST 31 11/23/2011   NA 131* 07/28/2013   K 4.5 07/28/2013   CL 91* 07/28/2013   CREATININE 0.8 07/28/2013   BUN 14 07/28/2013   CO2 30 07/28/2013   TSH 1.25 05/08/2013   INR 1.69* 03/02/2013    Assessment / Plan: 1. PAF -  now on Tikosyn - rhythm has been  stable. 5% mode switches with good rate control. Continue Tikosyn and metoprolol. Ecg in Feb was satisfactory with Qtc 444 msec.   2. Tachy/brady - with PPM in place - followed by Dr. Rayann Heman  3. HTN - well controlled today.  4. Chronic diastolic HF - looks well compensated.   5. Advanced age   I will review lab work from Dr. Brigitte Pulse. Otherwise follow up in 6 months.

## 2014-03-13 NOTE — Patient Instructions (Signed)
Continue your current medication  We will get your lab results from Dr. Brigitte Pulse  I will see you in 6 months.

## 2014-03-27 ENCOUNTER — Other Ambulatory Visit: Payer: Self-pay | Admitting: Internal Medicine

## 2014-05-06 ENCOUNTER — Ambulatory Visit (INDEPENDENT_AMBULATORY_CARE_PROVIDER_SITE_OTHER): Payer: Medicare Other | Admitting: *Deleted

## 2014-05-06 ENCOUNTER — Encounter: Payer: Self-pay | Admitting: Internal Medicine

## 2014-05-06 DIAGNOSIS — I495 Sick sinus syndrome: Secondary | ICD-10-CM | POA: Diagnosis not present

## 2014-05-06 DIAGNOSIS — I480 Paroxysmal atrial fibrillation: Secondary | ICD-10-CM

## 2014-05-06 DIAGNOSIS — I4891 Unspecified atrial fibrillation: Secondary | ICD-10-CM | POA: Diagnosis not present

## 2014-05-06 LAB — MDC_IDC_ENUM_SESS_TYPE_REMOTE
Brady Statistic RA Percent Paced: 92 %
Implantable Pulse Generator Model: 2210
Lead Channel Impedance Value: 430 Ohm
Lead Channel Sensing Intrinsic Amplitude: 12 mV
Lead Channel Setting Pacing Amplitude: 2 V
Lead Channel Setting Pacing Amplitude: 2.5 V
Lead Channel Setting Pacing Pulse Width: 0.5 ms
Lead Channel Setting Sensing Sensitivity: 2 mV
MDC IDC MSMT LEADCHNL RA IMPEDANCE VALUE: 610 Ohm
MDC IDC MSMT LEADCHNL RA SENSING INTR AMPL: 1.7 mV
MDC IDC PG SERIAL: 7280874
MDC IDC STAT BRADY RV PERCENT PACED: 28 %

## 2014-05-06 NOTE — Progress Notes (Signed)
Remote pacemaker transmission.   

## 2014-05-08 NOTE — Telephone Encounter (Signed)
error 

## 2014-05-15 ENCOUNTER — Other Ambulatory Visit (HOSPITAL_COMMUNITY): Payer: Self-pay | Admitting: Internal Medicine

## 2014-05-19 ENCOUNTER — Encounter: Payer: Self-pay | Admitting: Cardiology

## 2014-05-22 ENCOUNTER — Other Ambulatory Visit: Payer: Self-pay | Admitting: Internal Medicine

## 2014-06-04 DIAGNOSIS — R350 Frequency of micturition: Secondary | ICD-10-CM | POA: Diagnosis not present

## 2014-06-04 DIAGNOSIS — N3941 Urge incontinence: Secondary | ICD-10-CM | POA: Diagnosis not present

## 2014-06-11 ENCOUNTER — Encounter: Payer: Self-pay | Admitting: Podiatrist

## 2014-06-11 ENCOUNTER — Ambulatory Visit (INDEPENDENT_AMBULATORY_CARE_PROVIDER_SITE_OTHER): Payer: Medicare Other | Admitting: Podiatrist

## 2014-06-11 DIAGNOSIS — Z23 Encounter for immunization: Secondary | ICD-10-CM | POA: Diagnosis not present

## 2014-06-11 DIAGNOSIS — B351 Tinea unguium: Secondary | ICD-10-CM | POA: Diagnosis not present

## 2014-06-11 DIAGNOSIS — M79676 Pain in unspecified toe(s): Principal | ICD-10-CM

## 2014-06-11 DIAGNOSIS — M79609 Pain in unspecified limb: Secondary | ICD-10-CM

## 2014-06-15 NOTE — Progress Notes (Signed)
HPI: Patient presents today for follow up of foot and nail care. Denies any new complaints today.  Objective: Patients chart is reviewed. Vascular status reveals pedal pulses noted at 1 out of 4 dp and pt bilateral . Neurological sensation is Normal to [employer] monofilament bilateral. Patients nails are thickened, discolored, distrophic, friable and brittle with yellow-brown discoloration. Patient subjectively relates they are painful with shoes and with ambulation of bilateral feet.  Assessment: Symptomatic onychomycosis  Plan: Discussed treatment options and alternatives. The symptomatic toenails were debrided through manual an mechanical means without complication. Return appointment recommended at routine intervals of 3 months

## 2014-06-23 DIAGNOSIS — Z961 Presence of intraocular lens: Secondary | ICD-10-CM | POA: Diagnosis not present

## 2014-06-23 DIAGNOSIS — H401431 Capsular glaucoma with pseudoexfoliation of lens, bilateral, mild stage: Secondary | ICD-10-CM | POA: Diagnosis not present

## 2014-06-23 DIAGNOSIS — H2589 Other age-related cataract: Secondary | ICD-10-CM | POA: Diagnosis not present

## 2014-07-05 ENCOUNTER — Other Ambulatory Visit: Payer: Self-pay | Admitting: Internal Medicine

## 2014-08-02 ENCOUNTER — Other Ambulatory Visit: Payer: Self-pay | Admitting: Internal Medicine

## 2014-08-06 DIAGNOSIS — M8580 Other specified disorders of bone density and structure, unspecified site: Secondary | ICD-10-CM | POA: Diagnosis not present

## 2014-08-06 DIAGNOSIS — I5189 Other ill-defined heart diseases: Secondary | ICD-10-CM | POA: Diagnosis not present

## 2014-08-06 DIAGNOSIS — J849 Interstitial pulmonary disease, unspecified: Secondary | ICD-10-CM | POA: Diagnosis not present

## 2014-08-06 DIAGNOSIS — Z1389 Encounter for screening for other disorder: Secondary | ICD-10-CM | POA: Diagnosis not present

## 2014-08-06 DIAGNOSIS — Z950 Presence of cardiac pacemaker: Secondary | ICD-10-CM | POA: Diagnosis not present

## 2014-08-06 DIAGNOSIS — I480 Paroxysmal atrial fibrillation: Secondary | ICD-10-CM | POA: Diagnosis not present

## 2014-08-06 DIAGNOSIS — I10 Essential (primary) hypertension: Secondary | ICD-10-CM | POA: Diagnosis not present

## 2014-08-06 DIAGNOSIS — G3184 Mild cognitive impairment, so stated: Secondary | ICD-10-CM | POA: Diagnosis not present

## 2014-08-17 ENCOUNTER — Telehealth: Payer: Self-pay | Admitting: Cardiology

## 2014-08-17 ENCOUNTER — Ambulatory Visit (INDEPENDENT_AMBULATORY_CARE_PROVIDER_SITE_OTHER): Payer: Medicare Other | Admitting: *Deleted

## 2014-08-17 ENCOUNTER — Encounter: Payer: Self-pay | Admitting: Internal Medicine

## 2014-08-17 DIAGNOSIS — I495 Sick sinus syndrome: Secondary | ICD-10-CM | POA: Diagnosis not present

## 2014-08-17 NOTE — Telephone Encounter (Signed)
Spoke with pt and reminded pt of remote transmission that is due today. Pt verbalized understanding.   

## 2014-08-18 NOTE — Progress Notes (Signed)
Remote pacemaker transmission.   

## 2014-08-25 ENCOUNTER — Encounter: Payer: Self-pay | Admitting: Cardiology

## 2014-08-26 DIAGNOSIS — M8588 Other specified disorders of bone density and structure, other site: Secondary | ICD-10-CM | POA: Diagnosis not present

## 2014-08-26 DIAGNOSIS — Z6825 Body mass index (BMI) 25.0-25.9, adult: Secondary | ICD-10-CM | POA: Diagnosis not present

## 2014-08-26 DIAGNOSIS — W19XXXA Unspecified fall, initial encounter: Secondary | ICD-10-CM | POA: Diagnosis not present

## 2014-08-26 DIAGNOSIS — M169 Osteoarthritis of hip, unspecified: Secondary | ICD-10-CM | POA: Diagnosis not present

## 2014-08-26 DIAGNOSIS — M5489 Other dorsalgia: Secondary | ICD-10-CM | POA: Diagnosis not present

## 2014-08-26 DIAGNOSIS — M5136 Other intervertebral disc degeneration, lumbar region: Secondary | ICD-10-CM | POA: Diagnosis not present

## 2014-08-27 ENCOUNTER — Encounter (HOSPITAL_COMMUNITY): Payer: Self-pay | Admitting: Internal Medicine

## 2014-09-15 ENCOUNTER — Ambulatory Visit (INDEPENDENT_AMBULATORY_CARE_PROVIDER_SITE_OTHER): Payer: Medicare Other | Admitting: Cardiology

## 2014-09-15 ENCOUNTER — Encounter: Payer: Self-pay | Admitting: Cardiology

## 2014-09-15 VITALS — BP 142/82 | HR 83 | Ht 63.0 in | Wt 144.3 lb

## 2014-09-15 DIAGNOSIS — I10 Essential (primary) hypertension: Secondary | ICD-10-CM | POA: Diagnosis not present

## 2014-09-15 DIAGNOSIS — I5032 Chronic diastolic (congestive) heart failure: Secondary | ICD-10-CM | POA: Diagnosis not present

## 2014-09-15 DIAGNOSIS — I480 Paroxysmal atrial fibrillation: Secondary | ICD-10-CM

## 2014-09-15 DIAGNOSIS — I495 Sick sinus syndrome: Secondary | ICD-10-CM | POA: Diagnosis not present

## 2014-09-15 NOTE — Progress Notes (Signed)
[name] Date of Birth: [birth year] Medical Record #[id number]  History of Present Illness: [name] is seen back today for follow up of CHF and sick sinus syndrome.  She has chronic diastolic CHF, tachy brady with PPM in place, past cardioversions in October of 2012 and again in January of 2014. Recurrent atrial fib in June 2014 and was cardioverted and started on Multaq. Has been intolerant to amiodarone due to possible pulmonary toxicity. Then failed on Multaq due to recurrent arrhythmia. Has been put on Tikosyn and has been able to maintain sinus rhythm.   On follow up today she states she is doing ok. She does complain of having no energy and she does have dyspnea on exertion. No chest pain or palpitations. No dizziness. No increase edema and weight has been stable. No cough. Pacemaker check in early December was satisfactory. She had mode switches 1.6% of the time and no ventricular high rate episodes.    Current Outpatient Prescriptions  Medication Sig Dispense Refill  . amLODipine (NORVASC) 5 MG tablet Take 1 tablet (5 mg total) by mouth daily. 180 tablet 3  . BENICAR 40 MG tablet TAKE 1 TABLET DAILY 90 tablet 3  . Calcium Citrate (CITRACAL PO) Take 1 tablet by mouth 2 (two) times daily.     . Cholecalciferol (VITAMIN D) 400 UNITS capsule Take 400 Units by mouth 3 (three) times daily.    . divalproex (DEPAKOTE) 500 MG EC tablet Take 500 mg by mouth at bedtime.      . dofetilide (TIKOSYN) 250 MCG capsule Take 1 capsule (250 mcg total) by mouth 2 (two) times daily. 60 capsule 11  . esomeprazole (NEXIUM) 20 MG capsule Take 40 mg by mouth daily before breakfast.    . furosemide (LASIX) 40 MG tablet TAKE 1 TABLET DAILY 90 tablet 0  . HYDROcodone-acetaminophen (VICODIN) 5-500 MG per tablet Take 1 tablet by mouth every 6 (six) hours as needed. For pain     . KLOR-CON M10 10 MEQ tablet TAKE 1 TABLET TWICE A DAY 180 tablet 3  . metoprolol tartrate (LOPRESSOR) 25 MG tablet TAKE 1 TABLET TWICE  A DAY 180 tablet 2  . Multiple Vitamins-Minerals (MULTIVITAMINS THER. W/MINERALS) TABS Take 1 tablet by mouth daily.      . polyethylene glycol powder (GLYCOLAX/MIRALAX) powder Take 17 g by mouth daily as needed. For constipation    . PRADAXA 75 MG CAPS capsule TAKE 1 CAPSULE EVERY 12 HOURS 3 capsule 0  . TIKOSYN 250 MCG capsule TAKE 1 CAPSULE TWICE A DAY 180 capsule 1  . timolol (BETIMOL) 0.5 % ophthalmic solution Place 1 drop into both eyes 2 (two) times daily.     Marland Kitchen tolterodine (DETROL LA) 4 MG 24 hr capsule Take 4 mg by mouth every evening.     . travoprost, benzalkonium, (TRAVATAN) 0.004 % ophthalmic solution Place 1 drop into the left eye at bedtime.      No current facility-administered medications for this visit.    Allergies  Allergen Reactions  . Amiodarone     Possibly caused pneumonitis    Past Medical History  Diagnosis Date  . Pneumonia 04/2010  . Hypertension   . Renal artery stenosis 2008    STATUS POST ANGIOPLASTY  . GERD (gastroesophageal reflux disease)   . Back pain     CHRONIC  . Atrial fibrillation     Prior TEE/CV in May 2012; managed with rate control and anticoagulation  . Hyponatremia     improved  .  Seizure disorder     on anti-epileptic therapy  . Urinary incontinence   . Osteopenia   . SIADH (syndrome of inappropriate ADH production)     h/o HYPONATREMIA ; LIKELY RELATED TO HER LUNG PROCESS  . Arthritis   . Hiatal hernia   . Tachy-brady syndrome     with pauses noted in May 2012; s/p PTVP October 2012  . Dysphagia   . (HFpEF) heart failure with preserved ejection fraction   . Pacemaker     Past Surgical History  Procedure Laterality Date  . Cholecystectomy    . Back surgery      X2  . Total knee arthroplasty      RIGHT AND LEFT KNEE  . Tonsillectomy    . Appendectomy    . Cardiac catheterization  2007    Normal  . Knee arthroscopy      LEFT KNEE  . Cataract extraction    . Shoulder surgery    . Renal angioplasty  01/2007     RIGHT  . Partial hysterectomy    . Pacemaker insertion  06/2011    SJM Acc[employer] DR RF implanted by Dr Rayann Heman  . Eye surgery      cataracts removed on both eyes  . Cardioversion    . Cardioversion  10/14/2012    Procedure: CARDIOVERSION;  Surgeon: Thayer Headings, MD;  Location: [facility] ENDOSCOPY;  Service: Cardiovascular;  Laterality: N/A;  . Cardioversion N/A 05/30/2013    Procedure: CARDIOVERSION;  Surgeon: Thompson Grayer, MD;  Location: [place];  Service: Cardiovascular;  Laterality: N/A;  . Cardioversion N/A 03/03/2013    Procedure: CARDIOVERSION;  Surgeon: Deboraha Sprang, MD;  Location: [facility] CATH LAB;  Service: Cardiovascular;  Laterality: N/A;    History  Smoking status  . Never Smoker   Smokeless tobacco  . Never Used    History  Alcohol Use No    Family History  Problem Relation Age of Onset  . Stroke Mother   . Diabetes Mother   . Heart disease Mother     Unkown  . Stroke Father   . Diabetes Father   . Cancer Father     PROSTATE  . Cancer Brother     RENAL AND BLADDER  . Lung disease Brother   . Lung disease Sister   . Hypertension Daughter   . Hypertension Son     Review of Systems: The review of systems is per the HPI.  All other systems were reviewed and are negative.  Physical Exam: BP 142/82 mmHg  Pulse 83  Ht 5\' 3"  (1.6 m)  Wt 144 lb 4.8 oz (65.454 kg)  BMI 25.57 kg/m2 Patient is very pleasant and in no acute distress.  Skin is warm and dry. Color is normal.  HEENT is unremarkable. Normocephalic/atraumatic. PERRL. Sclera are nonicteric. Neck is supple. No masses. No JVD. Lungs reveals diffuse dry crackles. Cardiac exam shows a regular rate and rhythm. Normal S1-2. No gallop or murmur. Abdomen is soft. Extremities are without edema. Gait and ROM are intact. No gross neurologic deficits noted.  LABORATORY DATA:  Lab Results  Component Value Date   WBC 5.3 05/28/2013   HGB 12.3 05/28/2013   HCT 36.2 05/28/2013   PLT 167 05/28/2013   GLUCOSE 86 07/28/2013    ALT 26 11/23/2011   AST 31 11/23/2011   NA 131* 07/28/2013   K 4.5 07/28/2013   CL 91* 07/28/2013   CREATININE 0.8 07/28/2013   BUN 14 07/28/2013  CO2 30 07/28/2013   TSH 1.25 05/08/2013   INR 1.69* 03/02/2013    Assessment / Plan: 1. PAF - now on Tikosyn - rhythm has been stable. 1.6% mode switches with good rate control. Continue Tikosyn and metoprolol. Ecg in Feb was satisfactory with Qtc 444 msec. Labs followed by Dr. Brigitte Pulse. Will request a copy. If Renal panel hasn't been checked recently will repeat.   2. Tachy/brady - with PPM in place - followed by Dr. Rayann Heman  3. HTN - well controlled today.  4. Chronic diastolic HF - looks well compensated. No increase edema and wight stable.   5. Advanced age   I will review lab work from Dr. Brigitte Pulse. Otherwise follow up in 6 months.

## 2014-09-15 NOTE — Patient Instructions (Signed)
Continue your current therapy  I will get a copy of your lab work from Dr. Brigitte Pulse  I will see you in 6 months.

## 2014-09-16 ENCOUNTER — Other Ambulatory Visit: Payer: Self-pay | Admitting: Internal Medicine

## 2014-09-17 ENCOUNTER — Encounter: Payer: Self-pay | Admitting: Podiatrist

## 2014-09-17 ENCOUNTER — Ambulatory Visit (INDEPENDENT_AMBULATORY_CARE_PROVIDER_SITE_OTHER): Payer: Medicare Other | Admitting: Podiatrist

## 2014-09-17 ENCOUNTER — Ambulatory Visit: Payer: Medicare Other | Admitting: Podiatrist

## 2014-09-17 DIAGNOSIS — M79673 Pain in unspecified foot: Secondary | ICD-10-CM

## 2014-09-17 DIAGNOSIS — B351 Tinea unguium: Secondary | ICD-10-CM | POA: Diagnosis not present

## 2014-09-17 NOTE — Progress Notes (Signed)
HPI: Patient presents today for follow up of foot and nail care. Denies any new complaints today.  Objective: Patients chart is reviewed. Vascular status reveals pedal pulses noted at 1 out of 4 dp and pt bilateral . Neurological sensation is Normal to [employer] monofilament bilateral. Patients nails are thickened, discolored, distrophic, friable and brittle with yellow-brown discoloration. Patient subjectively relates they are painful with shoes and with ambulation of bilateral feet.  Assessment: Symptomatic onychomycosis  Plan: Discussed treatment options and alternatives. The symptomatic toenails were debrided through manual an mechanical means without complication. Return appointment recommended at routine intervals of 3 months

## 2014-09-21 ENCOUNTER — Encounter: Payer: Self-pay | Admitting: Cardiology

## 2014-09-22 DIAGNOSIS — H2589 Other age-related cataract: Secondary | ICD-10-CM | POA: Diagnosis not present

## 2014-09-22 DIAGNOSIS — H4011X2 Primary open-angle glaucoma, moderate stage: Secondary | ICD-10-CM | POA: Diagnosis not present

## 2014-09-22 DIAGNOSIS — Z961 Presence of intraocular lens: Secondary | ICD-10-CM | POA: Diagnosis not present

## 2014-09-23 ENCOUNTER — Encounter: Payer: Self-pay | Admitting: Cardiology

## 2014-09-27 ENCOUNTER — Other Ambulatory Visit: Payer: Self-pay | Admitting: Internal Medicine

## 2014-10-05 ENCOUNTER — Other Ambulatory Visit: Payer: Self-pay | Admitting: Internal Medicine

## 2014-10-05 ENCOUNTER — Telehealth: Payer: Self-pay | Admitting: Internal Medicine

## 2014-10-05 NOTE — Telephone Encounter (Signed)
New Prob    Requesting a new prescription of Metoprolol 25 mg sent to [facility] 90 day supply.

## 2014-10-07 ENCOUNTER — Other Ambulatory Visit: Payer: Self-pay | Admitting: Internal Medicine

## 2014-10-14 ENCOUNTER — Other Ambulatory Visit: Payer: Self-pay | Admitting: Internal Medicine

## 2014-10-21 DIAGNOSIS — Z85828 Personal history of other malignant neoplasm of skin: Secondary | ICD-10-CM | POA: Diagnosis not present

## 2014-10-21 DIAGNOSIS — D485 Neoplasm of uncertain behavior of skin: Secondary | ICD-10-CM | POA: Diagnosis not present

## 2014-10-21 DIAGNOSIS — L814 Other melanin hyperpigmentation: Secondary | ICD-10-CM | POA: Diagnosis not present

## 2014-10-21 DIAGNOSIS — D0461 Carcinoma in situ of skin of right upper limb, including shoulder: Secondary | ICD-10-CM | POA: Diagnosis not present

## 2014-10-21 DIAGNOSIS — D0462 Carcinoma in situ of skin of left upper limb, including shoulder: Secondary | ICD-10-CM | POA: Diagnosis not present

## 2014-10-21 DIAGNOSIS — L570 Actinic keratosis: Secondary | ICD-10-CM | POA: Diagnosis not present

## 2014-10-21 DIAGNOSIS — D1801 Hemangioma of skin and subcutaneous tissue: Secondary | ICD-10-CM | POA: Diagnosis not present

## 2014-10-21 DIAGNOSIS — L821 Other seborrheic keratosis: Secondary | ICD-10-CM | POA: Diagnosis not present

## 2014-10-21 DIAGNOSIS — D0472 Carcinoma in situ of skin of left lower limb, including hip: Secondary | ICD-10-CM | POA: Diagnosis not present

## 2014-10-21 DIAGNOSIS — D225 Melanocytic nevi of trunk: Secondary | ICD-10-CM | POA: Diagnosis not present

## 2014-10-22 LAB — MDC_IDC_ENUM_SESS_TYPE_REMOTE
Battery Remaining Percentage: 66 %
Battery Voltage: 2.93 V
Brady Statistic AP VS Percent: 73 %
Brady Statistic AS VS Percent: 1 %
Brady Statistic RA Percent Paced: 92 %
Date Time Interrogation Session: 20151130182008
Implantable Pulse Generator Serial Number: 7280874
Lead Channel Pacing Threshold Amplitude: 0.5 V
Lead Channel Pacing Threshold Amplitude: 1 V
Lead Channel Pacing Threshold Pulse Width: 0.8 ms
Lead Channel Sensing Intrinsic Amplitude: 0.9 mV
Lead Channel Setting Pacing Amplitude: 2.5 V
MDC IDC MSMT BATTERY REMAINING LONGEVITY: 67 mo
MDC IDC MSMT LEADCHNL RA IMPEDANCE VALUE: 430 Ohm
MDC IDC MSMT LEADCHNL RV IMPEDANCE VALUE: 660 Ohm
MDC IDC MSMT LEADCHNL RV PACING THRESHOLD PULSEWIDTH: 0.5 ms
MDC IDC MSMT LEADCHNL RV SENSING INTR AMPL: 12 mV
MDC IDC SET LEADCHNL RA PACING AMPLITUDE: 2 V
MDC IDC SET LEADCHNL RV PACING PULSEWIDTH: 0.5 ms
MDC IDC SET LEADCHNL RV SENSING SENSITIVITY: 2 mV
MDC IDC STAT BRADY AP VP PERCENT: 23 %
MDC IDC STAT BRADY AS VP PERCENT: 1 %
MDC IDC STAT BRADY RV PERCENT PACED: 24 %

## 2014-10-27 DIAGNOSIS — H2589 Other age-related cataract: Secondary | ICD-10-CM | POA: Diagnosis not present

## 2014-10-27 DIAGNOSIS — H401431 Capsular glaucoma with pseudoexfoliation of lens, bilateral, mild stage: Secondary | ICD-10-CM | POA: Diagnosis not present

## 2014-11-02 ENCOUNTER — Ambulatory Visit (INDEPENDENT_AMBULATORY_CARE_PROVIDER_SITE_OTHER): Payer: Medicare Other | Admitting: Internal Medicine

## 2014-11-02 ENCOUNTER — Encounter: Payer: Self-pay | Admitting: Internal Medicine

## 2014-11-02 VITALS — BP 140/62 | HR 77 | Ht 62.0 in | Wt 144.4 lb

## 2014-11-02 DIAGNOSIS — R5382 Chronic fatigue, unspecified: Secondary | ICD-10-CM

## 2014-11-02 DIAGNOSIS — I480 Paroxysmal atrial fibrillation: Secondary | ICD-10-CM | POA: Diagnosis not present

## 2014-11-02 DIAGNOSIS — I495 Sick sinus syndrome: Secondary | ICD-10-CM

## 2014-11-02 DIAGNOSIS — Z950 Presence of cardiac pacemaker: Secondary | ICD-10-CM

## 2014-11-02 DIAGNOSIS — R5383 Other fatigue: Secondary | ICD-10-CM | POA: Insufficient documentation

## 2014-11-02 DIAGNOSIS — I10 Essential (primary) hypertension: Secondary | ICD-10-CM | POA: Diagnosis not present

## 2014-11-02 DIAGNOSIS — Z7901 Long term (current) use of anticoagulants: Secondary | ICD-10-CM | POA: Diagnosis not present

## 2014-11-02 LAB — MDC_IDC_ENUM_SESS_TYPE_INCLINIC
Battery Remaining Longevity: 91.2 mo
Battery Voltage: 2.95 V
Brady Statistic RA Percent Paced: 92 %
Brady Statistic RV Percent Paced: 22 %
Implantable Pulse Generator Serial Number: 7280874
Lead Channel Impedance Value: 437.5 Ohm
Lead Channel Impedance Value: 625 Ohm
Lead Channel Pacing Threshold Amplitude: 0.75 V
Lead Channel Pacing Threshold Pulse Width: 0.8 ms
Lead Channel Pacing Threshold Pulse Width: 0.8 ms
Lead Channel Sensing Intrinsic Amplitude: 1.5 mV
Lead Channel Setting Pacing Pulse Width: 0.5 ms
MDC IDC MSMT LEADCHNL RA PACING THRESHOLD AMPLITUDE: 0.75 V
MDC IDC MSMT LEADCHNL RV PACING THRESHOLD AMPLITUDE: 0.5 V
MDC IDC MSMT LEADCHNL RV PACING THRESHOLD AMPLITUDE: 0.5 V
MDC IDC MSMT LEADCHNL RV PACING THRESHOLD PULSEWIDTH: 0.5 ms
MDC IDC MSMT LEADCHNL RV PACING THRESHOLD PULSEWIDTH: 0.5 ms
MDC IDC MSMT LEADCHNL RV SENSING INTR AMPL: 12 mV
MDC IDC SESS DTM: 20160215120542
MDC IDC SET LEADCHNL RA PACING AMPLITUDE: 2 V
MDC IDC SET LEADCHNL RV PACING AMPLITUDE: 2.5 V
MDC IDC SET LEADCHNL RV SENSING SENSITIVITY: 2 mV

## 2014-11-02 NOTE — Patient Instructions (Signed)
Remote monitoring is used to monitor your Pacemaker or ICD from home. This monitoring reduces the number of office visits required to check your device to one time per year. It allows [place] to keep an eye on the functioning of your device to ensure it is working properly. You are scheduled for a device check from home on 02/01/15. You may send your transmission at any time that day. If you have a wireless device, the transmission will be sent automatically. After your physician reviews your transmission, you will receive a postcard with your next transmission date.    Your physician wants you to follow-up in: 12 months with Dr Nathanial Rancher will receive a reminder letter in the mail two months in advance. If you don't receive a letter, please call our office to schedule the follow-up appointment.

## 2014-11-02 NOTE — Progress Notes (Signed)
Electrophysiology Office Note   Date:  11/02/2014   ID:  [name], DOB 10-23-1924, MRN [id number]  PCP:  Marton Redwood, MD  Cardiologist:  Dr Martinique Primary Electrophysiologist: Thompson Grayer, MD    Chief Complaint  Patient presents with  . Follow-up    AFIB     History of Present Illness: [name] is a 81 y.o. female who presents today for electrophysiology evaluation.   She has done quite well for the last year.  Her afib has been controlled and she has done well with tikosyn.  She reports good BP control at home.  Her primary concern is with fatigue.  This is chronic.  She does not snore and feels well rested in the AM.  Today, she denies symptoms of palpitations, chest pain, shortness of breath, orthopnea, PND, lower extremity edema, claudication, dizziness, presyncope, syncope, bleeding, or neurologic sequela. The patient is tolerating medications without difficulties and is otherwise without complaint today.    Past Medical History  Diagnosis Date  . Pneumonia 04/2010  . Hypertension   . Renal artery stenosis 2008    STATUS POST ANGIOPLASTY  . GERD (gastroesophageal reflux disease)   . Back pain     CHRONIC  . Atrial fibrillation     Prior TEE/CV in May 2012; managed with rate control and anticoagulation  . Hyponatremia     improved  . Seizure disorder     on anti-epileptic therapy  . Urinary incontinence   . Osteopenia   . SIADH (syndrome of inappropriate ADH production)     h/o HYPONATREMIA ; LIKELY RELATED TO HER LUNG PROCESS  . Arthritis   . Hiatal hernia   . Tachy-brady syndrome     with pauses noted in May 2012; s/p PTVP October 2012  . Dysphagia   . (HFpEF) heart failure with preserved ejection fraction   . Pacemaker    Past Surgical History  Procedure Laterality Date  . Cholecystectomy    . Back surgery      X2  . Total knee arthroplasty      RIGHT AND LEFT KNEE  . Tonsillectomy    . Appendectomy    . Cardiac catheterization  2007   Normal  . Knee arthroscopy      LEFT KNEE  . Cataract extraction    . Shoulder surgery    . Renal angioplasty  01/2007    RIGHT  . Partial hysterectomy    . Pacemaker insertion  06/2011    SJM Acc[employer] DR RF implanted by Dr Rayann Heman  . Eye surgery      cataracts removed on both eyes  . Cardioversion    . Cardioversion  10/14/2012    Procedure: CARDIOVERSION;  Surgeon: Thayer Headings, MD;  Location: [facility] ENDOSCOPY;  Service: Cardiovascular;  Laterality: N/A;  . Cardioversion N/A 05/30/2013    Procedure: CARDIOVERSION;  Surgeon: Thompson Grayer, MD;  Location: [place];  Service: Cardiovascular;  Laterality: N/A;  . Cardioversion N/A 03/03/2013    Procedure: CARDIOVERSION;  Surgeon: Deboraha Sprang, MD;  Location: [facility] CATH LAB;  Service: Cardiovascular;  Laterality: N/A;     Current Outpatient Prescriptions  Medication Sig Dispense Refill  . amLODipine (NORVASC) 5 MG tablet Take 1 tablet (5 mg total) by mouth daily. 180 tablet 3  . BENICAR 40 MG tablet TAKE 1 TABLET DAILY (Patient taking differently: TAKE 1 TABLET BY MOUTH DAILY) 90 tablet 3  . Calcium Citrate (CITRACAL PO) Take 1 tablet by mouth 2 (  two) times daily.     . Cholecalciferol (VITAMIN D) 400 UNITS capsule Take 400 Units by mouth 3 (three) times daily.    . divalproex (DEPAKOTE) 500 MG EC tablet Take 500 mg by mouth at bedtime.      Marland Kitchen esomeprazole (NEXIUM) 20 MG capsule Take 40 mg by mouth daily before breakfast.    . furosemide (LASIX) 40 MG tablet TAKE 1 TABLET DAILY (Patient taking differently: TAKE 1 TABLET BY MOUTH DAILY) 90 tablet 0  . HYDROcodone-acetaminophen (VICODIN) 5-500 MG per tablet Take 1 tablet by mouth every 6 (six) hours as needed. For pain     . KLOR-CON M10 10 MEQ tablet TAKE 1 TABLET TWICE A DAY (Patient taking differently: TAKE 1 TABLET BY MOUTH TWICE A DAY) 180 tablet 0  . metoprolol tartrate (LOPRESSOR) 25 MG tablet TAKE 1 TABLET TWICE A DAY (Patient taking differently: TAKE 1 TABLET BY MOUTH TWICE A DAY) 180 tablet  0  . Multiple Vitamins-Minerals (MULTIVITAMINS THER. W/MINERALS) TABS Take 1 tablet by mouth daily.      . polyethylene glycol powder (GLYCOLAX/MIRALAX) powder Take 17 g by mouth daily as needed. For constipation    . PRADAXA 75 MG CAPS capsule TAKE 1 CAPSULE EVERY 12 HOURS (Patient taking differently: TAKE 1 CAPSULE BY MOUTH EVERY 12 HOURS) 180 capsule 1  . TIKOSYN 250 MCG capsule TAKE 1 CAPSULE TWICE A DAY (Patient taking differently: TAKE 1 CAPSULE BY MOUTH TWICE A DAY) 180 capsule 0  . timolol (BETIMOL) 0.5 % ophthalmic solution Place 1 drop into both eyes 2 (two) times daily.     Marland Kitchen tolterodine (DETROL LA) 4 MG 24 hr capsule Take 4 mg by mouth every evening.     . travoprost, benzalkonium, (TRAVATAN) 0.004 % ophthalmic solution Place 1 drop into the left eye at bedtime.      No current facility-administered medications for this visit.    Allergies:   Amiodarone   Social History:  The patient  reports that she has never smoked. She has never used smokeless tobacco. She reports that she does not drink alcohol or use illicit drugs.   Family History:  The patient's family history includes Cancer in her brother and father; Diabetes in her father and mother; Heart disease in her mother; Hypertension in her daughter and son; Lung disease in her brother and sister; Stroke in her father and mother.    ROS:  Please see the history of present illness.   All other systems are reviewed and negative.    PHYSICAL EXAM: VS:  BP 140/62 mmHg  Pulse 77  Ht 5\' 2"  (1.575 m)  Wt 144 lb 6.4 oz (65.499 kg)  BMI 26.40 kg/m2 , BMI Body mass index is 26.4 kg/(m^2). GEN: Well nourished, well developed, in no acute distress HEENT: normal Neck: no JVD, carotid bruits, or masses Cardiac: RRR; no murmurs, rubs, or gallops,no edema  Respiratory:  clear to auscultation bilaterally, normal work of breathing GI: soft, nontender, nondistended, + BS MS: no deformity or atrophy Skin: warm and dry, device pocket is  well healed Neuro:  Strength and sensation are intact Psych: euthymic mood, full affect  EKG:  EKG is ordered today. The ekg ordered today shows AV pacing, QTc 479  Device interrogation is reviewed today in detail.  See PaceArt for details.   Recent Labs: No results found for requested labs within last 365 days.    Lipid Panel  No results found for: CHOL, TRIG, HDL, CHOLHDL, VLDL, LDLCALC, LDLDIRECT  Wt Readings from Last 3 Encounters:  11/02/14 144 lb 6.4 oz (65.499 kg)  09/15/14 144 lb 4.8 oz (65.454 kg)  03/13/14 143 lb (64.864 kg)      ASSESSMENT AND PLAN:  1.  Persistent afib Doing very well with tikosyn Labs reviewed in epic chads2vasc score is at least 5.  She is doing well with pradaxa PCP is following labs  2. HTN Stable No change required today  3. Tachy/brady Normal pacemaker function See Pace Art report No changes today Continue to follow remotely with merlin  4. Fatigue Pacemaker interrogation reveals blunted heart rate histogram I offered adjustment of rate response but she declines stating that she has done so well recently that she is afraid to making any changes    Current medicines are reviewed at length with the patient today.   The patient does not have concerns regarding her medicines.  The following changes were made today:  none  Follow-up: merlin Return to see me in 1 year Follow-up with Dr Martinique as scheduled PCP to follow CBC, BMET, Mg at least twice per year  Signed, Thompson Grayer, MD  11/02/2014 11:10 AM     [facility] [address] [place] [address] [phone] (office) [phone] (fax)

## 2014-11-05 ENCOUNTER — Encounter: Payer: Self-pay | Admitting: Cardiology

## 2014-11-09 ENCOUNTER — Encounter: Payer: Self-pay | Admitting: Internal Medicine

## 2014-11-16 ENCOUNTER — Other Ambulatory Visit: Payer: Self-pay | Admitting: Nurse Practitioner

## 2014-12-08 ENCOUNTER — Other Ambulatory Visit: Payer: Self-pay | Admitting: Internal Medicine

## 2014-12-08 DIAGNOSIS — Z961 Presence of intraocular lens: Secondary | ICD-10-CM | POA: Diagnosis not present

## 2014-12-08 DIAGNOSIS — H401431 Capsular glaucoma with pseudoexfoliation of lens, bilateral, mild stage: Secondary | ICD-10-CM | POA: Diagnosis not present

## 2014-12-08 DIAGNOSIS — H2589 Other age-related cataract: Secondary | ICD-10-CM | POA: Diagnosis not present

## 2014-12-11 ENCOUNTER — Telehealth: Payer: Self-pay | Admitting: Cardiology

## 2014-12-11 NOTE — Telephone Encounter (Signed)
Close encounter 

## 2014-12-17 ENCOUNTER — Ambulatory Visit: Payer: Medicare Other | Admitting: Podiatrist

## 2014-12-20 ENCOUNTER — Other Ambulatory Visit: Payer: Self-pay | Admitting: Internal Medicine

## 2014-12-20 ENCOUNTER — Encounter (HOSPITAL_COMMUNITY): Payer: Self-pay | Admitting: Emergency Medicine

## 2014-12-20 ENCOUNTER — Emergency Department (HOSPITAL_COMMUNITY)
Admission: EM | Admit: 2014-12-20 | Discharge: 2014-12-20 | Disposition: A | Discharge status: Home / Self Care | Payer: Medicare Other | Attending: Emergency Medicine | Admitting: Emergency Medicine

## 2014-12-20 ENCOUNTER — Emergency Department (HOSPITAL_COMMUNITY): Payer: Medicare Other

## 2014-12-20 DIAGNOSIS — Z7902 Long term (current) use of antithrombotics/antiplatelets: Secondary | ICD-10-CM | POA: Insufficient documentation

## 2014-12-20 DIAGNOSIS — W01198A Fall on same level from slipping, tripping and stumbling with subsequent striking against other object, initial encounter: Secondary | ICD-10-CM | POA: Insufficient documentation

## 2014-12-20 DIAGNOSIS — S064X0A Epidural hemorrhage without loss of consciousness, initial encounter: Secondary | ICD-10-CM | POA: Diagnosis not present

## 2014-12-20 DIAGNOSIS — Z950 Presence of cardiac pacemaker: Secondary | ICD-10-CM | POA: Insufficient documentation

## 2014-12-20 DIAGNOSIS — S0083XA Contusion of other part of head, initial encounter: Secondary | ICD-10-CM | POA: Diagnosis not present

## 2014-12-20 DIAGNOSIS — Z9889 Other specified postprocedural states: Secondary | ICD-10-CM | POA: Insufficient documentation

## 2014-12-20 DIAGNOSIS — Z8701 Personal history of pneumonia (recurrent): Secondary | ICD-10-CM | POA: Diagnosis not present

## 2014-12-20 DIAGNOSIS — Y9222 Religious institution as the place of occurrence of the external cause: Secondary | ICD-10-CM | POA: Diagnosis not present

## 2014-12-20 DIAGNOSIS — S0003XA Contusion of scalp, initial encounter: Secondary | ICD-10-CM | POA: Diagnosis not present

## 2014-12-20 DIAGNOSIS — Y998 Other external cause status: Secondary | ICD-10-CM | POA: Diagnosis not present

## 2014-12-20 DIAGNOSIS — M1990 Unspecified osteoarthritis, unspecified site: Secondary | ICD-10-CM | POA: Insufficient documentation

## 2014-12-20 DIAGNOSIS — Z79899 Other long term (current) drug therapy: Secondary | ICD-10-CM | POA: Diagnosis not present

## 2014-12-20 DIAGNOSIS — S098XXA Other specified injuries of head, initial encounter: Secondary | ICD-10-CM | POA: Diagnosis present

## 2014-12-20 DIAGNOSIS — G8929 Other chronic pain: Secondary | ICD-10-CM | POA: Insufficient documentation

## 2014-12-20 DIAGNOSIS — S61215A Laceration without foreign body of left ring finger without damage to nail, initial encounter: Secondary | ICD-10-CM | POA: Insufficient documentation

## 2014-12-20 DIAGNOSIS — Y9389 Activity, other specified: Secondary | ICD-10-CM | POA: Insufficient documentation

## 2014-12-20 DIAGNOSIS — W19XXXA Unspecified fall, initial encounter: Secondary | ICD-10-CM

## 2014-12-20 DIAGNOSIS — I10 Essential (primary) hypertension: Secondary | ICD-10-CM | POA: Diagnosis not present

## 2014-12-20 DIAGNOSIS — S61219A Laceration without foreign body of unspecified finger without damage to nail, initial encounter: Secondary | ICD-10-CM

## 2014-12-20 DIAGNOSIS — K219 Gastro-esophageal reflux disease without esophagitis: Secondary | ICD-10-CM | POA: Diagnosis not present

## 2014-12-20 MED ORDER — LIDOCAINE HCL (PF) 1 % IJ SOLN
2.0000 mL | Freq: Once | INTRAMUSCULAR | Status: AC
  Administered 2014-12-20: 2 mL

## 2014-12-20 MED ORDER — LIDOCAINE HCL (PF) 1 % IJ SOLN
INTRAMUSCULAR | Status: AC
  Administered 2014-12-20: 2 mL
  Filled 2014-12-20: qty 5

## 2014-12-20 NOTE — ED Notes (Addendum)
[place]. Pt hit head but denies LOC. Pt is on Pradaxa. Pt noted to have swelling, bruising to R forehead. Pt c/o HA. Pain 8/10. Pt has a pacemaker and hx of afib Pt also noted to have approx 1 inch lac to L middle finger

## 2014-12-20 NOTE — Discharge Instructions (Signed)
You fell and hit her head while you are on blood thinners. Watch for increasing headaches or changes in mental status.  Laceration Care, Adult A laceration is a cut or lesion that goes through all layers of the skin and into the tissue just beneath the skin. TREATMENT  Some lacerations may not require closure. Some lacerations may not be able to be closed due to an increased risk of infection. It is important to see your caregiver as soon as possible after an injury to minimize the risk of infection and maximize the opportunity for successful closure. If closure is appropriate, pain medicines may be given, if needed. The wound will be cleaned to help prevent infection. Your caregiver will use stitches (sutures), staples, wound glue (adhesive), or skin adhesive strips to repair the laceration. These tools bring the skin edges together to allow for faster healing and a better cosmetic outcome. However, all wounds will heal with a scar. Once the wound has healed, scarring can be minimized by covering the wound with sunscreen during the day for 1 full year. HOME CARE INSTRUCTIONS  For sutures or staples:  Keep the wound clean and dry.  If you were given a bandage (dressing), you should change it at least once a day. Also, change the dressing if it becomes wet or dirty, or as directed by your caregiver.  Wash the wound with soap and water 2 times a day. Rinse the wound off with water to remove all soap. Pat the wound dry with a clean towel.  After cleaning, apply a thin layer of the antibiotic ointment as recommended by your caregiver. This will help prevent infection and keep the dressing from sticking.  You may shower as usual after the first 24 hours. Do not soak the wound in water until the sutures are removed.  Only take over-the-counter or prescription medicines for pain, discomfort, or fever as directed by your caregiver.  Get your sutures or staples removed as directed by your  caregiver. For skin adhesive strips:  Keep the wound clean and dry.  Do not get the skin adhesive strips wet. You may bathe carefully, using caution to keep the wound dry.  If the wound gets wet, pat it dry with a clean towel.  Skin adhesive strips will fall off on their own. You may trim the strips as the wound heals. Do not remove skin adhesive strips that are still stuck to the wound. They will fall off in time. For wound adhesive:  You may briefly wet your wound in the shower or bath. Do not soak or scrub the wound. Do not swim. Avoid periods of heavy perspiration until the skin adhesive has fallen off on its own. After showering or bathing, gently pat the wound dry with a clean towel.  Do not apply liquid medicine, cream medicine, or ointment medicine to your wound while the skin adhesive is in place. This may loosen the film before your wound is healed.  If a dressing is placed over the wound, be careful not to apply tape directly over the skin adhesive. This may cause the adhesive to be pulled off before the wound is healed.  Avoid prolonged exposure to sunlight or tanning lamps while the skin adhesive is in place. Exposure to ultraviolet light in the first year will darken the scar.  The skin adhesive will usually remain in place for 5 to 10 days, then naturally fall off the skin. Do not pick at the adhesive film. You may need  a tetanus shot if:  You cannot remember when you had your last tetanus shot.  You have never had a tetanus shot. If you get a tetanus shot, your arm may swell, get red, and feel warm to the touch. This is common and not a problem. If you need a tetanus shot and you choose not to have one, there is a rare chance of getting tetanus. Sickness from tetanus can be serious. SEEK MEDICAL CARE IF:   You have redness, swelling, or increasing pain in the wound.  You see a red line that goes away from the wound.  You have yellowish-white fluid (pus) coming from  the wound.  You have a fever.  You notice a bad smell coming from the wound or dressing.  Your wound breaks open before or after sutures have been removed.  You notice something coming out of the wound such as wood or glass.  Your wound is on your hand or foot and you cannot move a finger or toe. SEEK IMMEDIATE MEDICAL CARE IF:   Your pain is not controlled with prescribed medicine.  You have severe swelling around the wound causing pain and numbness or a change in color in your arm, hand, leg, or foot.  Your wound splits open and starts bleeding.  You have worsening numbness, weakness, or loss of function of any joint around or beyond the wound.  You develop painful lumps near the wound or on the skin anywhere on your body. MAKE SURE YOU:   Understand these instructions.  Will watch your condition.  Will get help right away if you are not doing well or get worse. Document Released: 09/04/2005 Document Revised: 11/27/2011 Document Reviewed: 02/28/2011 [facility] Patient Information 2015 [place], [place]. This information is not intended to replace advice given to you by your health care provider. Make sure you discuss any questions you have with your health care provider.

## 2014-12-20 NOTE — ED Notes (Addendum)
MD at bedside. 

## 2014-12-20 NOTE — ED Provider Notes (Signed)
CSN: [id number]     Arrival date & time 12/20/14  0857 History   First MD Initiated Contact with Patient 12/20/14 [phone]     Chief Complaint  Patient presents with  . Fall     (Consider location/radiation/quality/duration/timing/severity/associated sxs/prior Treatment) Patient is a 81 y.o. female presenting with fall. The history is provided by the patient.  Fall Pertinent negatives include no chest pain, no abdominal pain and no shortness of breath.   patient with a mechanical fall at [employer]. No loss conscious. Has swelling to her right forehead. Patient denies headache. No confusion. She is on Pradaxa. No neck pain. No chest or abdominal pain. She does have a laceration to her middle finger on her left hand. No numbness or weakness.  Past Medical History  Diagnosis Date  . Pneumonia 04/2010  . Hypertension   . Renal artery stenosis 2008    STATUS POST ANGIOPLASTY  . GERD (gastroesophageal reflux disease)   . Back pain     CHRONIC  . Atrial fibrillation     Prior TEE/CV in May 2012; managed with rate control and anticoagulation  . Hyponatremia     improved  . Seizure disorder     on anti-epileptic therapy  . Urinary incontinence   . Osteopenia   . SIADH (syndrome of inappropriate ADH production)     h/o HYPONATREMIA ; LIKELY RELATED TO HER LUNG PROCESS  . Arthritis   . Hiatal hernia   . Tachy-brady syndrome     with pauses noted in May 2012; s/p PTVP October 2012  . Dysphagia   . (HFpEF) heart failure with preserved ejection fraction   . Pacemaker    Past Surgical History  Procedure Laterality Date  . Cholecystectomy    . Back surgery      X2  . Total knee arthroplasty      RIGHT AND LEFT KNEE  . Tonsillectomy    . Appendectomy    . Cardiac catheterization  2007    Normal  . Knee arthroscopy      LEFT KNEE  . Cataract extraction    . Shoulder surgery    . Renal angioplasty  01/2007    RIGHT  . Partial hysterectomy    . Pacemaker insertion  06/2011    SJM  [employer] DR RF implanted by Dr Rayann Heman  . Eye surgery      cataracts removed on both eyes  . Cardioversion    . Cardioversion  10/14/2012    Procedure: CARDIOVERSION;  Surgeon: Thayer Headings, MD;  Location: [facility] ENDOSCOPY;  Service: Cardiovascular;  Laterality: N/A;  . Cardioversion N/A 05/30/2013    Procedure: CARDIOVERSION;  Surgeon: Thompson Grayer, MD;  Location: [place];  Service: Cardiovascular;  Laterality: N/A;  . Cardioversion N/A 03/03/2013    Procedure: CARDIOVERSION;  Surgeon: Deboraha Sprang, MD;  Location: [facility] CATH LAB;  Service: Cardiovascular;  Laterality: N/A;   Family History  Problem Relation Age of Onset  . Stroke Mother   . Diabetes Mother   . Heart disease Mother     Unkown  . Stroke Father   . Diabetes Father   . Cancer Father     PROSTATE  . Cancer Brother     RENAL AND BLADDER  . Lung disease Brother   . Lung disease Sister   . Hypertension Daughter   . Hypertension Son    History  Substance Use Topics  . Smoking status: Never Smoker   . Smokeless tobacco: Never Used  .  Alcohol Use: No   OB History    No data available     Review of Systems  Constitutional: Negative for fever.  Respiratory: Negative for shortness of breath.   Cardiovascular: Negative for chest pain.  Gastrointestinal: Negative for abdominal pain.  Skin: Positive for wound.  Hematological: Bruises/bleeds easily.      Allergies  Amiodarone  Home Medications   Prior to Admission medications   Medication Sig Start Date End Date Taking? Authorizing Provider  amLODipine (NORVASC) 5 MG tablet TAKE 1 TABLET DAILY 11/17/14   Peter M Martinique, MD  BENICAR 40 MG tablet TAKE 1 TABLET DAILY Patient taking differently: TAKE 1 TABLET BY MOUTH DAILY 03/27/14   Thompson Grayer, MD  Calcium Citrate (CITRACAL PO) Take 1 tablet by mouth 2 (two) times daily.     Historical Provider, MD  Cholecalciferol (VITAMIN D) 400 UNITS capsule Take 400 Units by mouth 3 (three) times daily.    Historical Provider, MD   divalproex (DEPAKOTE) 500 MG EC tablet Take 500 mg by mouth at bedtime.      Historical Provider, MD  esomeprazole (NEXIUM) 20 MG capsule Take 40 mg by mouth daily before breakfast.    Historical Provider, MD  furosemide (LASIX) 40 MG tablet TAKE 1 TABLET DAILY Patient taking differently: TAKE 1 TABLET BY MOUTH DAILY 10/15/14   Thompson Grayer, MD  HYDROcodone-acetaminophen (VICODIN) 5-500 MG per tablet Take 1 tablet by mouth every 6 (six) hours as needed. For pain     Historical Provider, MD  KLOR-CON M10 10 MEQ tablet TAKE 1 TABLET TWICE A DAY 12/08/14   Thompson Grayer, MD  metoprolol tartrate (LOPRESSOR) 25 MG tablet TAKE 1 TABLET TWICE A DAY Patient taking differently: TAKE 1 TABLET BY MOUTH TWICE A DAY 10/06/14   Thompson Grayer, MD  Multiple Vitamins-Minerals (MULTIVITAMINS THER. W/MINERALS) TABS Take 1 tablet by mouth daily.      Historical Provider, MD  polyethylene glycol powder (GLYCOLAX/MIRALAX) powder Take 17 g by mouth daily as needed. For constipation    Historical Provider, MD  PRADAXA 75 MG CAPS capsule TAKE 1 CAPSULE EVERY 12 HOURS Patient taking differently: TAKE 1 CAPSULE BY MOUTH EVERY 12 HOURS 09/16/14   Thompson Grayer, MD  TIKOSYN 250 MCG capsule TAKE 1 CAPSULE TWICE A DAY Patient taking differently: TAKE 1 CAPSULE BY MOUTH TWICE A DAY 10/08/14   Thompson Grayer, MD  timolol (BETIMOL) 0.5 % ophthalmic solution Place 1 drop into both eyes 2 (two) times daily.     Historical Provider, MD  tolterodine (DETROL LA) 4 MG 24 hr capsule Take 4 mg by mouth every evening.     Historical Provider, MD  travoprost, benzalkonium, (TRAVATAN) 0.004 % ophthalmic solution Place 1 drop into the left eye at bedtime.     Historical Provider, MD   BP 151/64 mmHg  Pulse 60  Temp(Src) 97.5 F (36.4 C) (Oral)  Resp 24  Ht 5\' 3"  (1.6 m)  Wt 140 lb (63.504 kg)  BMI 24.81 kg/m2  SpO2 95% Physical Exam  Constitutional: She is oriented to person, place, and time. She appears well-developed and well-nourished.   HENT:  Head: Normocephalic and atraumatic.  Eyes: EOM are normal. Pupils are equal, round, and reactive to light.  Neck: Normal range of motion. Neck supple.  Cardiovascular: Normal rate, regular rhythm and normal heart sounds.   No murmur heard. Pulmonary/Chest: Effort normal and breath sounds normal. No respiratory distress. She has no wheezes. She has no rales.  Abdominal: Soft. Bowel  sounds are normal. She exhibits no distension. There is no tenderness. There is no rebound and no guarding.  Musculoskeletal: Normal range of motion.  Neurological: She is alert and oriented to person, place, and time. No cranial nerve deficit.  Skin: Skin is warm and dry.  2 cm horizontal laceration along dorsum of left middle finger. Good flexion-extension. Full range of motion done and tendon visualized without injury.  Psychiatric: She has a normal mood and affect. Her speech is normal.  Nursing note and vitals reviewed.   ED Course  Procedures (including critical care time) Labs Review Labs Reviewed - No data to display  Imaging Review No results found.   EKG Interpretation None     LACERATION REPAIR Performed by: Mackie Pai. Authorized by: Mackie Pai Consent: Verbal consent obtained. Risks and benefits: risks, benefits and alternatives were discussed Consent given by: patient Patient identity confirmed: provided demographic data Prepped and Draped in normal sterile fashion Wound explored  Laceration Location: Left ring finger  Laceration Length: 1.5cm  No Foreign Bodies seen or palpated  Anesthesia: Digital block   Local anesthetic: lidocaine 2%   Anesthetic total: 1.5 ml   Skin closure: 5-0 Vicryl Rapide   Number of sutures: 6   Technique: Simple interrupted   Patient tolerance: Patient tolerated the procedure well with no immediate complications.  MDM   Final diagnoses:  None    Patient with fall on anticoagulation. Negative head CT. Finger  laceration without apparent underlying injury to bone or tendon. Wound closed. Splinted since the laceration is right near the joint line. Splint can probably come off in about 3 days and sutures out in around 10 days.    Davonna Belling, MD 12/20/14 [phone]

## 2014-12-20 NOTE — ED Notes (Signed)
Pt placed in gown and in bed. Pt monitored by pulse ox, bp cuff, and 12-lead. 

## 2014-12-28 ENCOUNTER — Other Ambulatory Visit: Payer: Self-pay | Admitting: Internal Medicine

## 2014-12-30 ENCOUNTER — Emergency Department (HOSPITAL_COMMUNITY)
Admission: EM | Admit: 2014-12-30 | Discharge: 2014-12-30 | Disposition: A | Discharge status: Home / Self Care | Payer: Medicare Other | Attending: Emergency Medicine | Admitting: Emergency Medicine

## 2014-12-30 ENCOUNTER — Encounter (HOSPITAL_COMMUNITY): Payer: Self-pay | Admitting: Family Medicine

## 2014-12-30 ENCOUNTER — Emergency Department (HOSPITAL_COMMUNITY): Payer: Medicare Other

## 2014-12-30 DIAGNOSIS — S299XXA Unspecified injury of thorax, initial encounter: Secondary | ICD-10-CM | POA: Diagnosis not present

## 2014-12-30 DIAGNOSIS — Z8669 Personal history of other diseases of the nervous system and sense organs: Secondary | ICD-10-CM | POA: Diagnosis not present

## 2014-12-30 DIAGNOSIS — G8929 Other chronic pain: Secondary | ICD-10-CM | POA: Insufficient documentation

## 2014-12-30 DIAGNOSIS — Z4802 Encounter for removal of sutures: Secondary | ICD-10-CM | POA: Diagnosis not present

## 2014-12-30 DIAGNOSIS — K219 Gastro-esophageal reflux disease without esophagitis: Secondary | ICD-10-CM | POA: Diagnosis not present

## 2014-12-30 DIAGNOSIS — Z7901 Long term (current) use of anticoagulants: Secondary | ICD-10-CM | POA: Diagnosis not present

## 2014-12-30 DIAGNOSIS — W1830XD Fall on same level, unspecified, subsequent encounter: Secondary | ICD-10-CM | POA: Insufficient documentation

## 2014-12-30 DIAGNOSIS — I4891 Unspecified atrial fibrillation: Secondary | ICD-10-CM | POA: Insufficient documentation

## 2014-12-30 DIAGNOSIS — S0083XD Contusion of other part of head, subsequent encounter: Secondary | ICD-10-CM | POA: Diagnosis not present

## 2014-12-30 DIAGNOSIS — Z950 Presence of cardiac pacemaker: Secondary | ICD-10-CM | POA: Diagnosis not present

## 2014-12-30 DIAGNOSIS — Z9889 Other specified postprocedural states: Secondary | ICD-10-CM | POA: Insufficient documentation

## 2014-12-30 DIAGNOSIS — Z8701 Personal history of pneumonia (recurrent): Secondary | ICD-10-CM | POA: Diagnosis not present

## 2014-12-30 DIAGNOSIS — M1990 Unspecified osteoarthritis, unspecified site: Secondary | ICD-10-CM | POA: Insufficient documentation

## 2014-12-30 DIAGNOSIS — I10 Essential (primary) hypertension: Secondary | ICD-10-CM | POA: Diagnosis not present

## 2014-12-30 DIAGNOSIS — Z79899 Other long term (current) drug therapy: Secondary | ICD-10-CM | POA: Insufficient documentation

## 2014-12-30 DIAGNOSIS — S60222A Contusion of left hand, initial encounter: Secondary | ICD-10-CM | POA: Diagnosis not present

## 2014-12-30 DIAGNOSIS — R0781 Pleurodynia: Secondary | ICD-10-CM | POA: Diagnosis not present

## 2014-12-30 DIAGNOSIS — S299XXD Unspecified injury of thorax, subsequent encounter: Secondary | ICD-10-CM | POA: Diagnosis not present

## 2014-12-30 DIAGNOSIS — S6992XD Unspecified injury of left wrist, hand and finger(s), subsequent encounter: Secondary | ICD-10-CM | POA: Insufficient documentation

## 2014-12-30 NOTE — ED Provider Notes (Signed)
CSN: [id number]     Arrival date & time 12/30/14  1444 History  This chart was scribed for non-physician practitioner, Montine Circle, PA-C, working with Wandra Arthurs, MD by Ladene Artist, ED Scribe. This patient was seen in room TR10C/TR10C and the patient's care was started at 3:18 PM.   Chief Complaint  Patient presents with  . Suture / Staple Removal   The history is provided by the patient. No language interpreter was used.   HPI Comments: [name] is a 81 y.o. female, with a h/o HTN, renal artery stenosis, a-fib, hyponatremia, seizure disorder, osteopenia, who presents to the Emergency Department with a chief complaint of suture removal. Pt had 6 sutures placed to her L middle finger on 12/20/14 following a fall. Pt denies pain to her L middle finger but reports bruising and pain to her L ring finger. She also reports persistent pain and a bruise to her R ribs that she states was obtained from the fall on 12/20/14.   Past Medical History  Diagnosis Date  . Pneumonia 04/2010  . Hypertension   . Renal artery stenosis 2008    STATUS POST ANGIOPLASTY  . GERD (gastroesophageal reflux disease)   . Back pain     CHRONIC  . Atrial fibrillation     Prior TEE/CV in May 2012; managed with rate control and anticoagulation  . Hyponatremia     improved  . Seizure disorder     on anti-epileptic therapy  . Urinary incontinence   . Osteopenia   . SIADH (syndrome of inappropriate ADH production)     h/o HYPONATREMIA ; LIKELY RELATED TO HER LUNG PROCESS  . Arthritis   . Hiatal hernia   . Tachy-brady syndrome     with pauses noted in May 2012; s/p PTVP October 2012  . Dysphagia   . (HFpEF) heart failure with preserved ejection fraction   . Pacemaker    Past Surgical History  Procedure Laterality Date  . Cholecystectomy    . Back surgery      X2  . Total knee arthroplasty      RIGHT AND LEFT KNEE  . Tonsillectomy    . Appendectomy    . Cardiac catheterization  2007    Normal  .  Knee arthroscopy      LEFT KNEE  . Cataract extraction    . Shoulder surgery    . Renal angioplasty  01/2007    RIGHT  . Partial hysterectomy    . Pacemaker insertion  06/2011    SJM [employer] DR RF implanted by Dr Rayann Heman  . Eye surgery      cataracts removed on both eyes  . Cardioversion    . Cardioversion  10/14/2012    Procedure: CARDIOVERSION;  Surgeon: Thayer Headings, MD;  Location: [facility] ENDOSCOPY;  Service: Cardiovascular;  Laterality: N/A;  . Cardioversion N/A 05/30/2013    Procedure: CARDIOVERSION;  Surgeon: Thompson Grayer, MD;  Location: [place];  Service: Cardiovascular;  Laterality: N/A;  . Cardioversion N/A 03/03/2013    Procedure: CARDIOVERSION;  Surgeon: Deboraha Sprang, MD;  Location: [facility] CATH LAB;  Service: Cardiovascular;  Laterality: N/A;   Family History  Problem Relation Age of Onset  . Stroke Mother   . Diabetes Mother   . Heart disease Mother     Unkown  . Stroke Father   . Diabetes Father   . Cancer Father     PROSTATE  . Cancer Brother     RENAL AND  BLADDER  . Lung disease Brother   . Lung disease Sister   . Hypertension Daughter   . Hypertension Son    History  Substance Use Topics  . Smoking status: Never Smoker   . Smokeless tobacco: Never Used  . Alcohol Use: No   OB History    No data available     Review of Systems  Constitutional: Negative for fever and chills.  Respiratory: Negative for shortness of breath.   Cardiovascular: Negative for chest pain.  Gastrointestinal: Negative for nausea, vomiting, diarrhea and constipation.  Genitourinary: Negative for dysuria.  Musculoskeletal: Positive for arthralgias.       + Rib pain  Skin: Positive for color change.   Allergies  Amiodarone  Home Medications   Prior to Admission medications   Medication Sig Start Date End Date Taking? Authorizing Provider  amLODipine (NORVASC) 5 MG tablet TAKE 1 TABLET DAILY 11/17/14   Peter M Martinique, MD  BENICAR 40 MG tablet TAKE 1 TABLET DAILY Patient taking  differently: TAKE 1 TABLET BY MOUTH DAILY 03/27/14   Thompson Grayer, MD  Calcium Citrate (CITRACAL PO) Take 1 tablet by mouth 2 (two) times daily.     Historical Provider, MD  Cholecalciferol (VITAMIN D) 400 UNITS capsule Take 400 Units by mouth 3 (three) times daily.    Historical Provider, MD  divalproex (DEPAKOTE) 500 MG EC tablet Take 500 mg by mouth at bedtime.      Historical Provider, MD  esomeprazole (NEXIUM) 20 MG capsule Take 40 mg by mouth daily before breakfast.    Historical Provider, MD  furosemide (LASIX) 40 MG tablet TAKE 1 TABLET DAILY 12/28/14   Thompson Grayer, MD  HYDROcodone-acetaminophen (VICODIN) 5-500 MG per tablet Take 1 tablet by mouth every 6 (six) hours as needed. For pain     Historical Provider, MD  KLOR-CON M10 10 MEQ tablet TAKE 1 TABLET TWICE A DAY 12/08/14   Thompson Grayer, MD  metoprolol tartrate (LOPRESSOR) 25 MG tablet TAKE 1 TABLET TWICE A DAY Patient taking differently: TAKE 1 TABLET BY MOUTH TWICE A DAY 10/06/14   Thompson Grayer, MD  Multiple Vitamins-Minerals (MULTIVITAMINS THER. W/MINERALS) TABS Take 1 tablet by mouth daily.      Historical Provider, MD  polyethylene glycol powder (GLYCOLAX/MIRALAX) powder Take 17 g by mouth daily as needed. For constipation    Historical Provider, MD  PRADAXA 75 MG CAPS capsule TAKE 1 CAPSULE EVERY 12 HOURS Patient taking differently: TAKE 1 CAPSULE BY MOUTH EVERY 12 HOURS 09/16/14   Thompson Grayer, MD  TIKOSYN 250 MCG capsule TAKE 1 CAPSULE TWICE A DAY 12/21/14   Thompson Grayer, MD  timolol (BETIMOL) 0.5 % ophthalmic solution Place 1 drop into both eyes 2 (two) times daily.     Historical Provider, MD  tolterodine (DETROL LA) 4 MG 24 hr capsule Take 4 mg by mouth every evening.     Historical Provider, MD  travoprost, benzalkonium, (TRAVATAN) 0.004 % ophthalmic solution Place 1 drop into the left eye at bedtime.     Historical Provider, MD   BP 151/65 mmHg  Pulse 60  Temp(Src) 97.6 F (36.4 C)  Resp 16  SpO2 100% Physical Exam   Constitutional: She is oriented to person, place, and time. She appears well-developed and well-nourished. No distress.  HENT:  Head: Normocephalic and atraumatic.  Contusion to right side of face  Eyes: Conjunctivae and EOM are normal. Pupils are equal, round, and reactive to light.  Neck: Normal range of motion. Neck supple.  No tracheal deviation present.  Cardiovascular: Normal rate and regular rhythm.  Exam reveals no gallop and no friction rub.   No murmur heard. Pulmonary/Chest: Effort normal and breath sounds normal. No respiratory distress. She has no wheezes. She has no rales. She exhibits no tenderness.  Clear to auscultation bilaterally  Abdominal: Soft. Bowel sounds are normal. She exhibits no distension and no mass. There is no tenderness. There is no rebound and no guarding.  Musculoskeletal: Normal range of motion. She exhibits no edema or tenderness.  Right ribs mildly tender to palpation, no bony abnormality or deformity  Left ring finger moderately tender to palpation, range of motion strength is slightly limited secondary to pain, no bony abnormality or deformity  Neurological: She is alert and oriented to person, place, and time.  Skin: Skin is warm and dry.  Psychiatric: She has a normal mood and affect. Her behavior is normal. Judgment and thought content normal.  Nursing note and vitals reviewed.  ED Course  Procedures (including critical care time) DIAGNOSTIC STUDIES: Oxygen Saturation is 100% on RA, normal by my interpretation.    COORDINATION OF CARE: 3:29 PM-Discussed treatment plan which includes XR with pt at bedside and pt agreed to plan.   Labs Review Labs Reviewed - No data to display  Imaging Review No results found.   EKG Interpretation None      MDM   Final diagnoses:  Visit for suture removal  Finger injury, left, subsequent encounter  Rib pain    Patient with visit for suture removal. Sutures removed, there has been mild dehiscence  of the original laceration. I placed Steri-Strips to assist in closure. Will give finger splint. Additionally, patient asked about imaging for her left ring finger which she says been sore since the fall as well as her right ribs which is been sore since the fall. She does not have any chest pain or shortness of breath. I will obtain imaging of the ribs and hands. Will splint the fingers for protection and recommend primary care follow-up. If patient has broken ribs, will treat supportively. Patient is otherwise well-appearing and is not in any apparent distress.  Patient signed out to [place], [place], who will follow--up on imaging.  I personally performed the services described in this documentation, which was scribed in my presence. The recorded information has been reviewed and is accurate.     Montine Circle, PA-C 12/30/14 [place], MD 12/30/14 2022

## 2014-12-30 NOTE — ED Notes (Signed)
Pt here to have sutures removed from left middle finger. Denies any problems with finger,

## 2014-12-30 NOTE — Discharge Instructions (Signed)
Finger Fracture Fractures of fingers are breaks in the bones of the fingers. There are many types of fractures. There are different ways of treating these fractures. Your health care provider will discuss the best way to treat your fracture. CAUSES Traumatic injury is the main cause of broken fingers. These include:  Injuries while playing sports.  Workplace injuries.  Falls. RISK FACTORS Activities that can increase your risk of finger fractures include:  Sports.  Workplace activities that involve machinery.  A condition called osteoporosis, which can make your bones less dense and cause them to fracture more easily. SIGNS AND SYMPTOMS The main symptoms of a broken finger are pain and swelling within 15 minutes after the injury. Other symptoms include:  Bruising of your finger.  Stiffness of your finger.  Numbness of your finger.  Exposed bones (compound fracture) if the fracture is severe. DIAGNOSIS  The best way to diagnose a broken bone is with X-ray imaging. Additionally, your health care provider will use this X-ray image to evaluate the position of the broken finger bones.  TREATMENT  Finger fractures can be treated with:   Nonreduction--This means the bones are in place. The finger is splinted without changing the positions of the bone pieces. The splint is usually left on for about a week to 10 days. This will depend on your fracture and what your health care provider thinks.  Closed reduction--The bones are put back into position without using surgery. The finger is then splinted.  Open reduction and internal fixation--The fracture site is opened. Then the bone pieces are fixed into place with pins or some type of hardware. This is seldom required. It depends on the severity of the fracture. HOME CARE INSTRUCTIONS   Follow your health care provider's instructions regarding activities, exercises, and physical therapy.  Only take over-the-counter or prescription  medicines for pain, discomfort, or fever as directed by your health care provider. SEEK MEDICAL CARE IF: You have pain or swelling that limits the motion or use of your fingers. SEEK IMMEDIATE MEDICAL CARE IF:  Your finger becomes numb. MAKE SURE YOU:   Understand these instructions.  Will watch your condition.  Will get help right away if you are not doing well or get worse. Document Released: 12/17/2000 Document Revised: 06/25/2013 Document Reviewed: 04/16/2013 [facility] Patient Information 2015 [place], [place]. This information is not intended to replace advice given to you by your health care provider. Make sure you discuss any questions you have with your health care provider. Rib Fracture A rib fracture is a break or crack in one of the bones of the ribs. The ribs are a group of long, curved bones that wrap around your chest and attach to your spine. They protect your lungs and other organs in the chest cavity. A broken or cracked rib is often painful, but most do not cause other problems. Most rib fractures heal on their own over time. However, rib fractures can be more serious if multiple ribs are broken or if broken ribs move out of place and push against other structures. CAUSES   A direct blow to the chest. For example, this could happen during contact sports, a car accident, or a fall against a hard object.  Repetitive movements with high force, such as pitching a baseball or having severe coughing spells. SYMPTOMS   Pain when you breathe in or cough.  Pain when someone presses on the injured area. DIAGNOSIS  Your caregiver will perform a physical exam. Various imaging tests  may be ordered to confirm the diagnosis and to look for related injuries. These tests may include a chest X-ray, computed tomography (CT), magnetic resonance imaging (MRI), or a bone scan. TREATMENT  Rib fractures usually heal on their own in 1-3 months. The longer healing period is often associated with a  continued cough or other aggravating activities. During the healing period, pain control is very important. Medication is usually given to control pain. Hospitalization or surgery may be needed for more severe injuries, such as those in which multiple ribs are broken or the ribs have moved out of place.  HOME CARE INSTRUCTIONS   Avoid strenuous activity and any activities or movements that cause pain. Be careful during activities and avoid bumping the injured rib.  Gradually increase activity as directed by your caregiver.  Only take over-the-counter or prescription medications as directed by your caregiver. Do not take other medications without asking your caregiver first.  Apply ice to the injured area for the first 1-2 days after you have been treated or as directed by your caregiver. Applying ice helps to reduce inflammation and pain.  Put ice in a plastic bag.  Place a towel between your skin and the bag.   Leave the ice on for 15-20 minutes at a time, every 2 hours while you are awake.  Perform deep breathing as directed by your caregiver. This will help prevent pneumonia, which is a common complication of a broken rib. Your caregiver may instruct you to:  Take deep breaths several times a day.  Try to cough several times a day, holding a pillow against the injured area.  Use a device called an incentive spirometer to practice deep breathing several times a day.  Drink enough fluids to keep your urine clear or pale yellow. This will help you avoid constipation.   Do not wear a rib belt or binder. These restrict breathing, which can lead to pneumonia.  SEEK IMMEDIATE MEDICAL CARE IF:   You have a fever.   You have difficulty breathing or shortness of breath.   You develop a continual cough, or you cough up thick or bloody sputum.  You feel sick to your stomach (nausea), throw up (vomit), or have abdominal pain.   You have worsening pain not controlled with  medications.  MAKE SURE YOU:  Understand these instructions.  Will watch your condition.  Will get help right away if you are not doing well or get worse. Document Released: 09/04/2005 Document Revised: 05/07/2013 Document Reviewed: 11/06/2012 [facility] Patient Information 2015 [place], [place]. This information is not intended to replace advice given to you by your health care provider. Make sure you discuss any questions you have with your health care provider.

## 2014-12-31 ENCOUNTER — Ambulatory Visit (INDEPENDENT_AMBULATORY_CARE_PROVIDER_SITE_OTHER): Payer: Medicare Other | Admitting: Podiatry

## 2014-12-31 DIAGNOSIS — B351 Tinea unguium: Secondary | ICD-10-CM

## 2014-12-31 DIAGNOSIS — M79676 Pain in unspecified toe(s): Secondary | ICD-10-CM

## 2014-12-31 DIAGNOSIS — M79673 Pain in unspecified foot: Secondary | ICD-10-CM

## 2015-01-01 ENCOUNTER — Encounter: Payer: Self-pay | Admitting: Podiatrist

## 2015-01-01 NOTE — Progress Notes (Signed)
HPI: Patient presents today for follow up of foot and nail care. Denies any new complaints today.  Objective: Patients chart is reviewed. Vascular status reveals pedal pulses noted at 1 out of 4 dp and pt bilateral . Neurological sensation is Normal to [employer] monofilament bilateral. Patients nails are thickened, discolored, distrophic, friable and brittle with yellow-brown discoloration. Patient subjectively relates they are painful with shoes and with ambulation of bilateral feet.  Assessment: Symptomatic onychomycosis  Plan: Discussed treatment options and alternatives. The symptomatic toenails were debrided through manual an mechanical means without complication. Return appointment recommended at routine intervals of 3 months

## 2015-01-07 DIAGNOSIS — Z961 Presence of intraocular lens: Secondary | ICD-10-CM | POA: Diagnosis not present

## 2015-01-07 DIAGNOSIS — H401431 Capsular glaucoma with pseudoexfoliation of lens, bilateral, mild stage: Secondary | ICD-10-CM | POA: Diagnosis not present

## 2015-01-22 ENCOUNTER — Other Ambulatory Visit: Payer: Self-pay | Admitting: Internal Medicine

## 2015-01-29 ENCOUNTER — Other Ambulatory Visit: Payer: Self-pay

## 2015-01-29 MED ORDER — METOPROLOL TARTRATE 25 MG PO TABS: 25.0000 mg | ORAL_TABLET | Freq: Two times a day (BID) | ORAL | Status: DC

## 2015-02-01 ENCOUNTER — Encounter: Payer: Self-pay | Admitting: Internal Medicine

## 2015-02-01 ENCOUNTER — Ambulatory Visit (INDEPENDENT_AMBULATORY_CARE_PROVIDER_SITE_OTHER): Payer: Medicare Other | Admitting: *Deleted

## 2015-02-01 DIAGNOSIS — I495 Sick sinus syndrome: Secondary | ICD-10-CM

## 2015-02-01 NOTE — Progress Notes (Signed)
Remote pacemaker transmission.   

## 2015-02-02 DIAGNOSIS — G3184 Mild cognitive impairment, so stated: Secondary | ICD-10-CM | POA: Diagnosis not present

## 2015-02-02 DIAGNOSIS — K219 Gastro-esophageal reflux disease without esophagitis: Secondary | ICD-10-CM | POA: Diagnosis not present

## 2015-02-02 DIAGNOSIS — I5189 Other ill-defined heart diseases: Secondary | ICD-10-CM | POA: Diagnosis not present

## 2015-02-02 DIAGNOSIS — M859 Disorder of bone density and structure, unspecified: Secondary | ICD-10-CM | POA: Diagnosis not present

## 2015-02-02 DIAGNOSIS — Z6826 Body mass index (BMI) 26.0-26.9, adult: Secondary | ICD-10-CM | POA: Diagnosis not present

## 2015-02-02 DIAGNOSIS — W19XXXA Unspecified fall, initial encounter: Secondary | ICD-10-CM | POA: Diagnosis not present

## 2015-02-02 DIAGNOSIS — Z950 Presence of cardiac pacemaker: Secondary | ICD-10-CM | POA: Diagnosis not present

## 2015-02-02 DIAGNOSIS — I10 Essential (primary) hypertension: Secondary | ICD-10-CM | POA: Diagnosis not present

## 2015-02-02 DIAGNOSIS — Z7901 Long term (current) use of anticoagulants: Secondary | ICD-10-CM | POA: Diagnosis not present

## 2015-02-02 DIAGNOSIS — R569 Unspecified convulsions: Secondary | ICD-10-CM | POA: Diagnosis not present

## 2015-02-02 DIAGNOSIS — I480 Paroxysmal atrial fibrillation: Secondary | ICD-10-CM | POA: Diagnosis not present

## 2015-02-03 LAB — CUP PACEART REMOTE DEVICE CHECK
Lead Channel Impedance Value: 400 Ohm
Lead Channel Impedance Value: 610 Ohm
Lead Channel Sensing Intrinsic Amplitude: 0.7 mV
Lead Channel Sensing Intrinsic Amplitude: 12 mV
Lead Channel Setting Pacing Amplitude: 2.5 V
Lead Channel Setting Sensing Sensitivity: 2 mV
MDC IDC MSMT BATTERY IMPEDANCE: 2.95 Ohm
MDC IDC SESS DTM: 20160518083213
MDC IDC SET LEADCHNL RA PACING AMPLITUDE: 2 V
MDC IDC SET LEADCHNL RV PACING PULSEWIDTH: 0.5 ms
MDC IDC STAT BRADY RA PERCENT PACED: 64 %
MDC IDC STAT BRADY RV PERCENT PACED: 32 %
Pulse Gen Model: 2210
Pulse Gen Serial Number: 7280874

## 2015-02-10 ENCOUNTER — Encounter: Payer: Self-pay | Admitting: Cardiology

## 2015-02-15 ENCOUNTER — Other Ambulatory Visit: Payer: Self-pay | Admitting: Internal Medicine

## 2015-02-16 ENCOUNTER — Telehealth: Payer: Self-pay | Admitting: Cardiology

## 2015-02-16 NOTE — Telephone Encounter (Signed)
Mr. [name] is calling because they are trying to get her Metropolol 25mg  filled from [facility] and they are saying that they need authorization for the medication . [facility] ask that they can call it into [phone].Marland Kitchen Please call if you have any questions .Marland Kitchen   Thanks

## 2015-02-16 NOTE — Telephone Encounter (Signed)
Spoke with [facility] - medication was delivered to patient on 5/12  Called patient - spoke with husband - he states they received a letter stating they needed to call patient's MD regarding this Rx. He states they should have the medication.   No further action necessary

## 2015-02-22 ENCOUNTER — Other Ambulatory Visit: Payer: Self-pay

## 2015-02-22 DIAGNOSIS — Z1231 Encounter for screening mammogram for malignant neoplasm of breast: Secondary | ICD-10-CM

## 2015-02-23 ENCOUNTER — Other Ambulatory Visit: Payer: Self-pay | Admitting: Internal Medicine

## 2015-02-24 ENCOUNTER — Other Ambulatory Visit (HOSPITAL_COMMUNITY): Payer: Self-pay | Admitting: Internal Medicine

## 2015-02-26 ENCOUNTER — Ambulatory Visit (HOSPITAL_COMMUNITY)
Admission: RE | Admit: 2015-02-26 | Discharge: 2015-02-26 | Disposition: A | Discharge status: Home / Self Care | Payer: Medicare Other | Source: Ambulatory Visit | Attending: Internal Medicine | Admitting: Internal Medicine

## 2015-02-26 ENCOUNTER — Encounter (HOSPITAL_COMMUNITY): Payer: Self-pay

## 2015-02-26 ENCOUNTER — Other Ambulatory Visit (HOSPITAL_COMMUNITY): Payer: Self-pay | Admitting: Internal Medicine

## 2015-02-26 DIAGNOSIS — M810 Age-related osteoporosis without current pathological fracture: Secondary | ICD-10-CM | POA: Insufficient documentation

## 2015-02-26 MED ORDER — SODIUM CHLORIDE 0.9 % IV SOLN
INTRAVENOUS | Status: DC
  Administered 2015-02-26: 15:00:00 via INTRAVENOUS

## 2015-02-26 MED ORDER — ZOLEDRONIC ACID 5 MG/100ML IV SOLN
5.0000 mg | Freq: Once | INTRAVENOUS | Status: AC
  Administered 2015-02-26: 5 mg via INTRAVENOUS
  Filled 2015-02-26: qty 100

## 2015-02-26 NOTE — Discharge Instructions (Signed)

## 2015-03-19 ENCOUNTER — Ambulatory Visit
Admission: RE | Admit: 2015-03-19 | Discharge: 2015-03-19 | Disposition: A | Discharge status: Home / Self Care | Payer: Medicare Other | Source: Ambulatory Visit

## 2015-03-19 DIAGNOSIS — Z1231 Encounter for screening mammogram for malignant neoplasm of breast: Secondary | ICD-10-CM

## 2015-03-29 ENCOUNTER — Ambulatory Visit: Payer: Medicare Other | Admitting: Podiatry

## 2015-03-30 ENCOUNTER — Ambulatory Visit (INDEPENDENT_AMBULATORY_CARE_PROVIDER_SITE_OTHER): Payer: Medicare Other | Admitting: Podiatry

## 2015-03-30 ENCOUNTER — Encounter: Payer: Self-pay | Admitting: Podiatry

## 2015-03-30 DIAGNOSIS — B351 Tinea unguium: Secondary | ICD-10-CM

## 2015-03-30 DIAGNOSIS — M79676 Pain in unspecified toe(s): Secondary | ICD-10-CM | POA: Diagnosis not present

## 2015-03-30 NOTE — Progress Notes (Signed)
Patient ID: [name], female   DOB: April 18, 1925, 81 y.o.   MRN: [id number]  Subjective: This patient presents at her request requesting debridement of painful toenails. The last visit for this service was 09/17/2014  Objective: The toenails are elongated, incurvated, discolored, brittle, and tender to direct palpation 6-10  Assessment: Symptomatic onychomycoses 6-10  Plan: Debridement of toenails mechanically intellectual without any bleeding  Reappoint at patient's request

## 2015-04-08 DIAGNOSIS — H401431 Capsular glaucoma with pseudoexfoliation of lens, bilateral, mild stage: Secondary | ICD-10-CM | POA: Diagnosis not present

## 2015-04-08 DIAGNOSIS — Z961 Presence of intraocular lens: Secondary | ICD-10-CM | POA: Diagnosis not present

## 2015-04-08 DIAGNOSIS — H2589 Other age-related cataract: Secondary | ICD-10-CM | POA: Diagnosis not present

## 2015-05-02 IMAGING — CR DG CHEST 2V
2 series · 2 of 2 positions shown · non-contrast
Comparison: 02/28/2013

CLINICAL DATA: Atrial fibrillation, shortness of breath

EXAM:
CHEST  2 VIEW

[w chest pa]
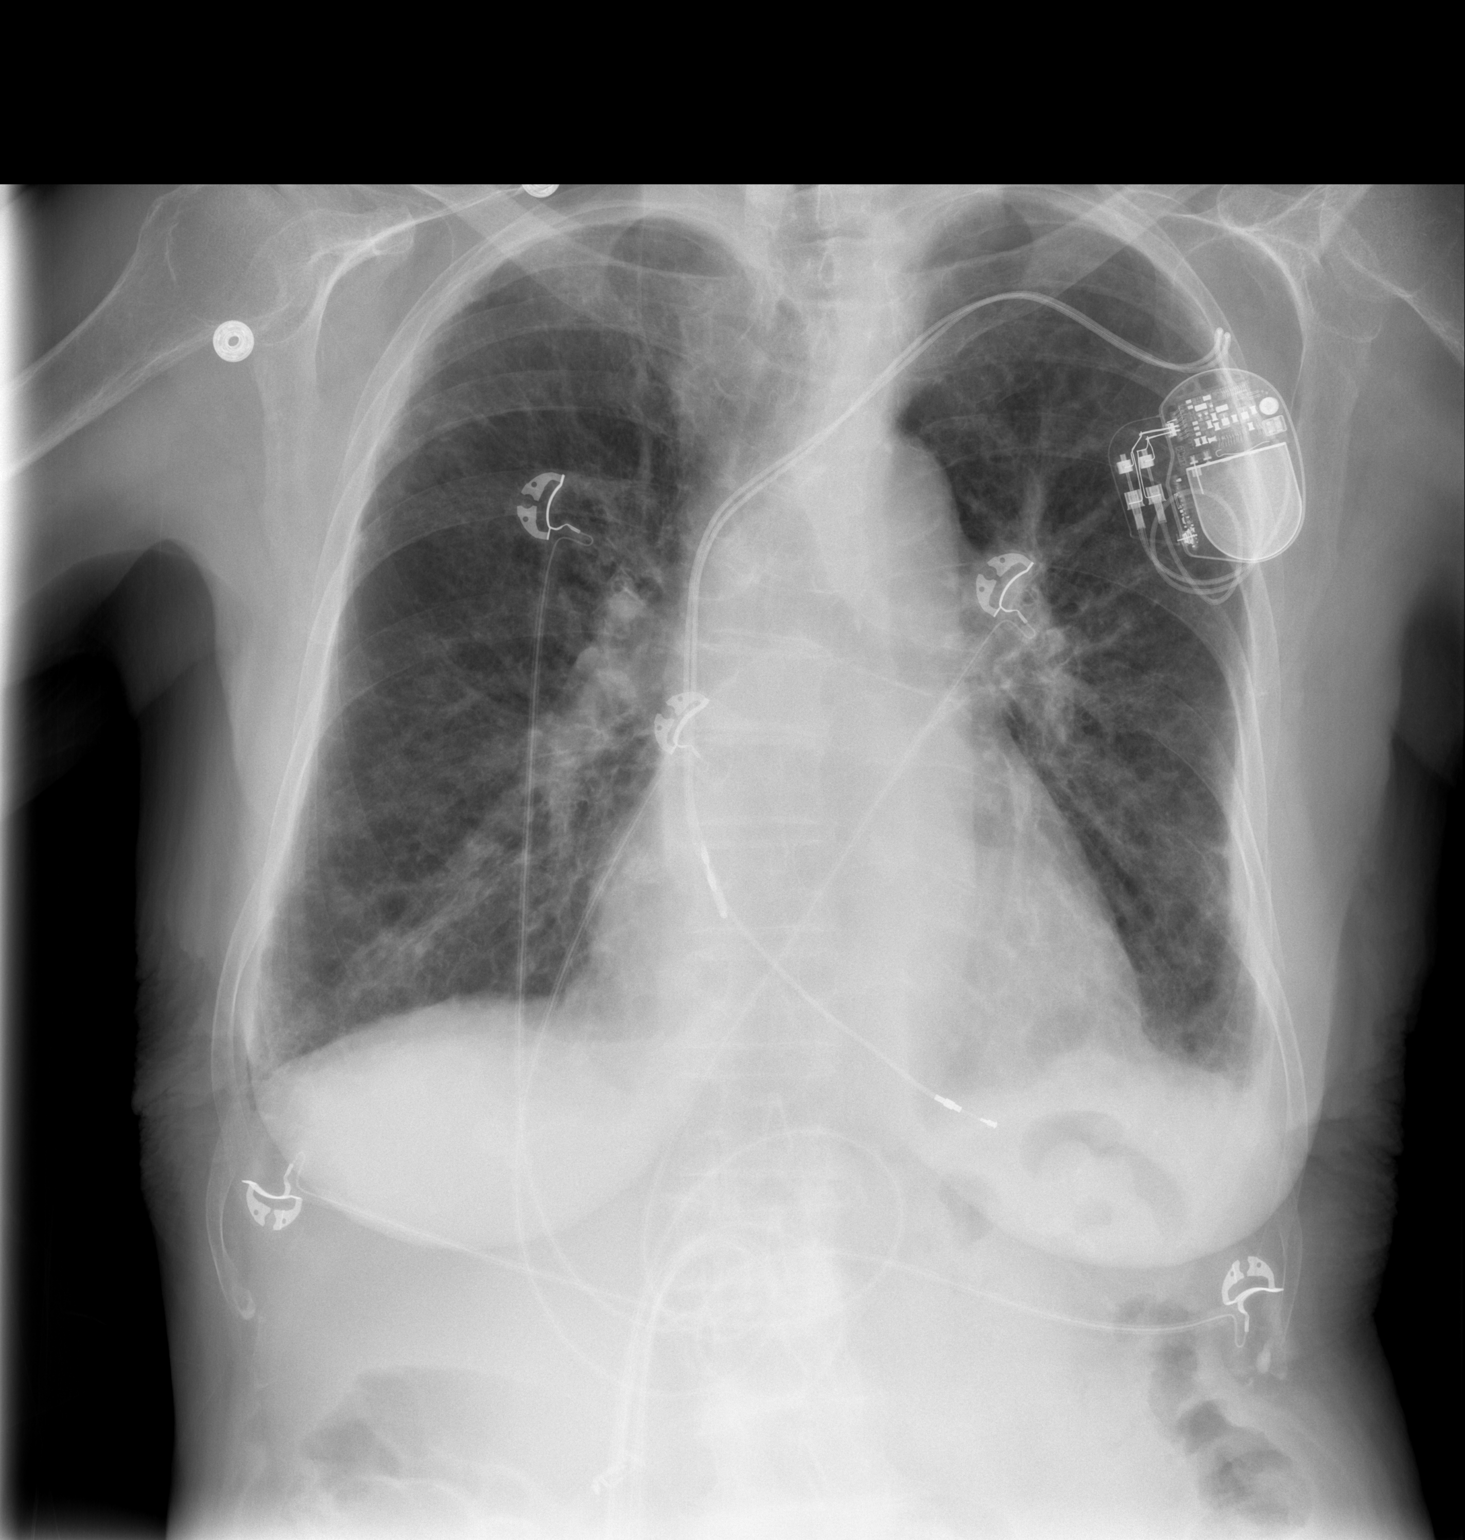

[w chest lat]
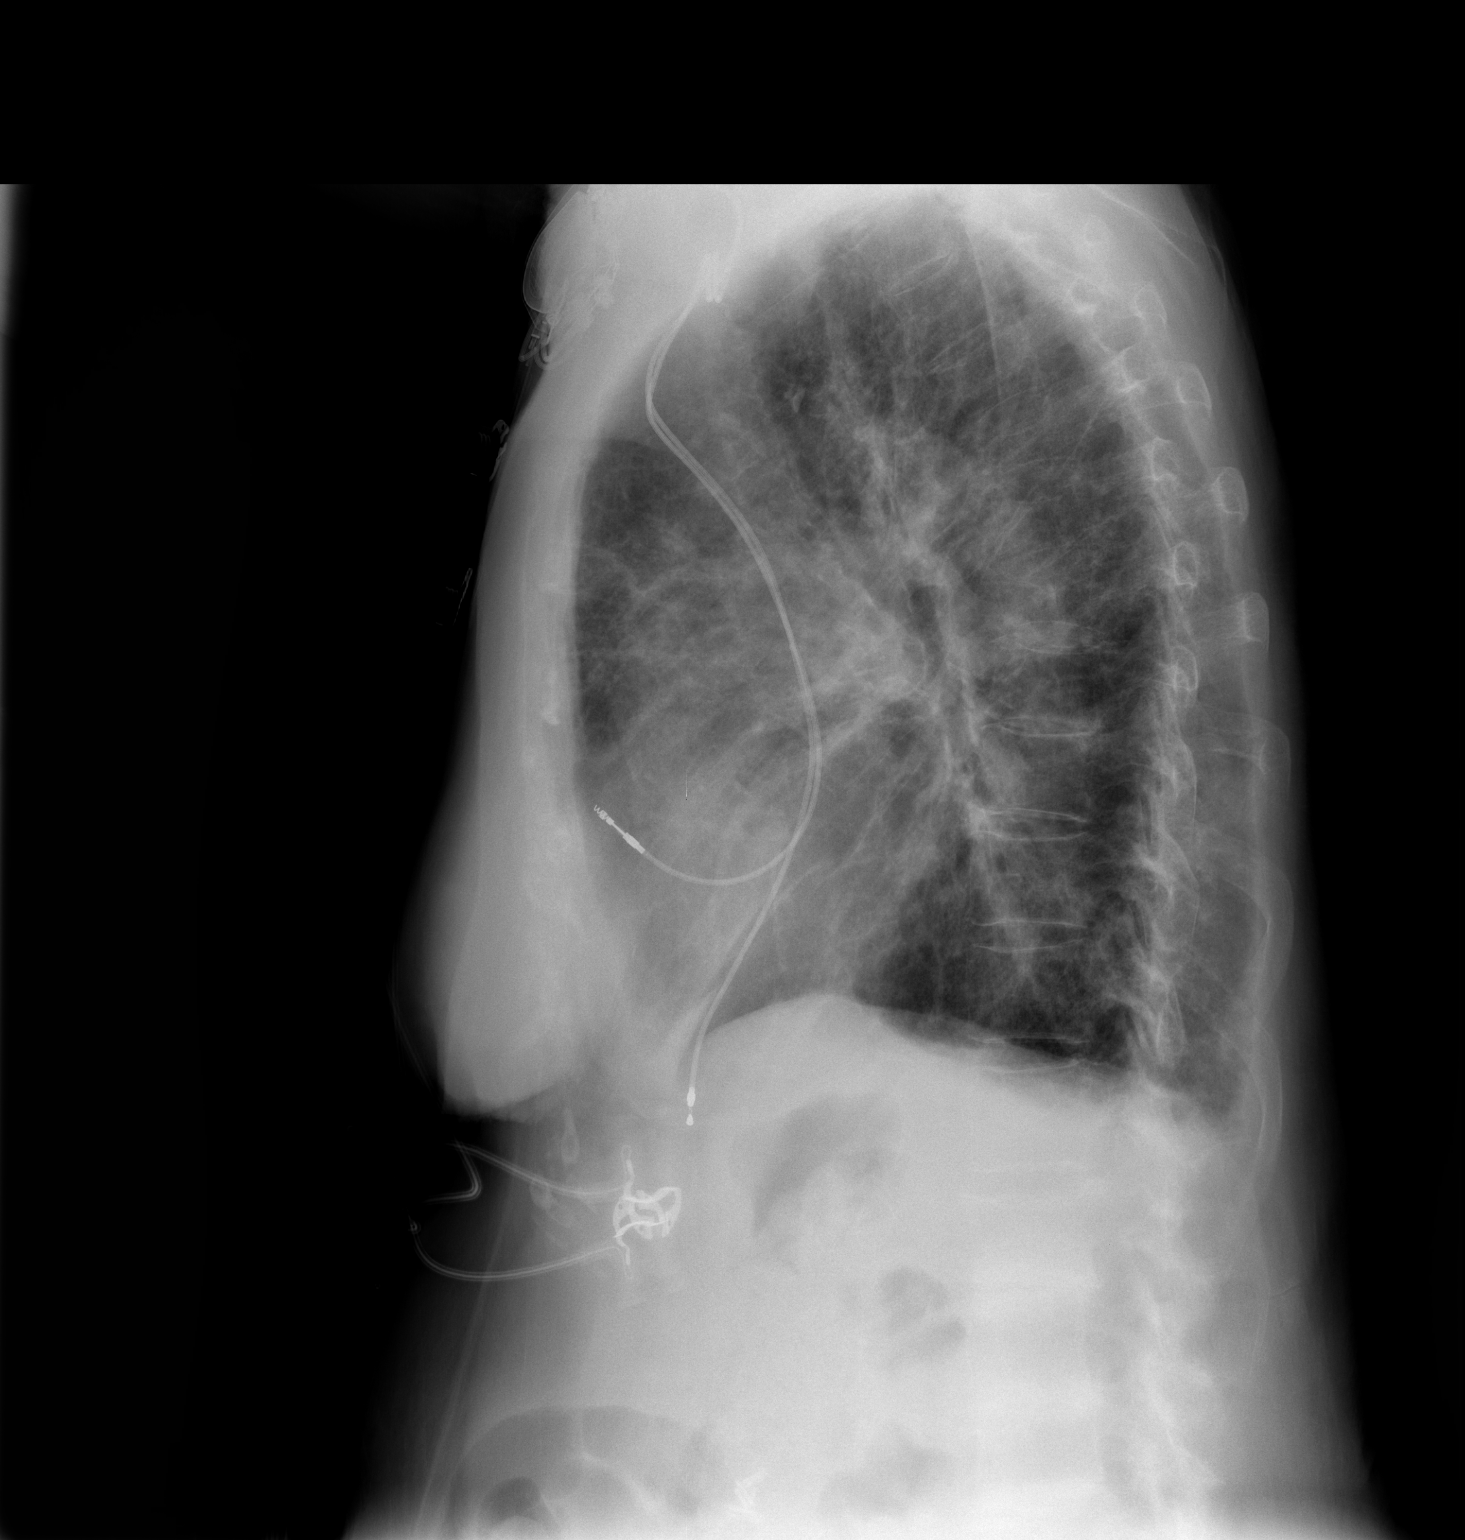

[2 of 2 positions shown; findings below may reference images not displayed]

FINDINGS: Cardiac shadow is stable. A pacing device is again seen. Mild
interstitial changes are again noted similar to that seen on the
prior exam. No focal infiltrate or sizable effusion is seen. Mild
hyperinflation is noted.
IMPRESSION: Chronic changes without acute abnormality.

## 2015-05-03 ENCOUNTER — Encounter: Payer: Self-pay | Admitting: Internal Medicine

## 2015-05-03 ENCOUNTER — Ambulatory Visit (INDEPENDENT_AMBULATORY_CARE_PROVIDER_SITE_OTHER): Payer: Medicare Other | Admitting: *Deleted

## 2015-05-03 DIAGNOSIS — I495 Sick sinus syndrome: Secondary | ICD-10-CM | POA: Diagnosis not present

## 2015-05-03 NOTE — Progress Notes (Signed)
Remote pacemaker transmission.   

## 2015-05-05 ENCOUNTER — Encounter: Payer: Self-pay | Admitting: Cardiology

## 2015-05-05 ENCOUNTER — Ambulatory Visit (INDEPENDENT_AMBULATORY_CARE_PROVIDER_SITE_OTHER): Payer: Medicare Other | Admitting: Cardiology

## 2015-05-05 VITALS — BP 126/64 | HR 69 | Ht 63.0 in | Wt 132.0 lb

## 2015-05-05 DIAGNOSIS — I10 Essential (primary) hypertension: Secondary | ICD-10-CM | POA: Insufficient documentation

## 2015-05-05 DIAGNOSIS — I5032 Chronic diastolic (congestive) heart failure: Secondary | ICD-10-CM | POA: Diagnosis not present

## 2015-05-05 DIAGNOSIS — I495 Sick sinus syndrome: Secondary | ICD-10-CM | POA: Diagnosis not present

## 2015-05-05 DIAGNOSIS — Z7901 Long term (current) use of anticoagulants: Secondary | ICD-10-CM

## 2015-05-05 DIAGNOSIS — I480 Paroxysmal atrial fibrillation: Secondary | ICD-10-CM

## 2015-05-05 NOTE — Patient Instructions (Signed)
Continue your current therapy  I will see you in 6 months.   

## 2015-05-05 NOTE — Progress Notes (Signed)
[name] Date of Birth: 03/16/1925 Medical Record #[id number]  History of Present Illness: [name] is seen back today for follow up of CHF and sick sinus syndrome.  She has chronic diastolic CHF, tachy brady with PPM in place, past cardioversions in October of 2012 and again in January of 2014. Recurrent atrial fib in June 2014 and was cardioverted and started on Multaq. Has been intolerant to amiodarone due to possible pulmonary toxicity. Then failed on Multaq due to recurrent arrhythmia. Has been put on Tikosyn and has been able to maintain sinus rhythm well.  On follow up today she states she is doing ok. She does complain of having no energy. She denies any significant dyspnea. Notes mild swelling in ankles. Doesn't take lasix every day.  No chest pain or palpitations. No dizziness. Weight has dropped 12 lbs without any real change in diet.    Current Outpatient Prescriptions  Medication Sig Dispense Refill  . amLODipine (NORVASC) 5 MG tablet TAKE 1 TABLET DAILY 90 tablet 3  . BENICAR 40 MG tablet TAKE 1 TABLET DAILY 90 tablet 3  . Calcium Citrate (CITRACAL PO) Take 1 tablet by mouth 2 (two) times daily.     . Cholecalciferol (VITAMIN D) 400 UNITS capsule Take 400 Units by mouth 3 (three) times daily.    . divalproex (DEPAKOTE) 500 MG EC tablet Take 500 mg by mouth at bedtime.      Marland Kitchen esomeprazole (NEXIUM) 20 MG capsule Take 40 mg by mouth daily before breakfast.    . furosemide (LASIX) 40 MG tablet TAKE 1 TABLET DAILY 90 tablet 3  . HYDROcodone-acetaminophen (VICODIN) 5-500 MG per tablet Take 1 tablet by mouth every 6 (six) hours as needed. For pain     . KLOR-CON M10 10 MEQ tablet TAKE 1 TABLET TWICE A DAY 180 tablet 2  . metoprolol tartrate (LOPRESSOR) 25 MG tablet Take 1 tablet (25 mg total) by mouth 2 (two) times daily. 180 tablet 1  . Multiple Vitamins-Minerals (MULTIVITAMINS THER. W/MINERALS) TABS Take 1 tablet by mouth daily.      . polyethylene glycol powder  (GLYCOLAX/MIRALAX) powder Take 17 g by mouth daily as needed. For constipation    . PRADAXA 75 MG CAPS capsule TAKE 1 CAPSULE EVERY 12 HOURS 180 capsule 1  . TIKOSYN 250 MCG capsule TAKE 1 CAPSULE TWICE A DAY 180 capsule 2  . timolol (BETIMOL) 0.5 % ophthalmic solution Place 1 drop into both eyes 2 (two) times daily.     Marland Kitchen tolterodine (DETROL LA) 4 MG 24 hr capsule Take 4 mg by mouth every evening.     . travoprost, benzalkonium, (TRAVATAN) 0.004 % ophthalmic solution Place 1 drop into the left eye at bedtime.      No current facility-administered medications for this visit.    Allergies  Allergen Reactions  . Amiodarone     Possibly caused pneumonitis    Past Medical History  Diagnosis Date  . Pneumonia 04/2010  . Hypertension   . Renal artery stenosis 2008    STATUS POST ANGIOPLASTY  . GERD (gastroesophageal reflux disease)   . Back pain     CHRONIC  . Atrial fibrillation     Prior TEE/CV in May 2012; managed with rate control and anticoagulation  . Hyponatremia     improved  . Seizure disorder     on anti-epileptic therapy  . Urinary incontinence   . Osteopenia   . SIADH (syndrome of inappropriate ADH production)  h/o HYPONATREMIA ; LIKELY RELATED TO HER LUNG PROCESS  . Arthritis   . Hiatal hernia   . Tachy-brady syndrome     with pauses noted in May 2012; s/p PTVP October 2012  . Dysphagia   . (HFpEF) heart failure with preserved ejection fraction   . Pacemaker     Past Surgical History  Procedure Laterality Date  . Cholecystectomy    . Back surgery      X2  . Total knee arthroplasty      RIGHT AND LEFT KNEE  . Tonsillectomy    . Appendectomy    . Cardiac catheterization  2007    Normal  . Knee arthroscopy      LEFT KNEE  . Cataract extraction    . Shoulder surgery    . Renal angioplasty  01/2007    RIGHT  . Partial hysterectomy    . Pacemaker insertion  06/2011    [facility] [employer] DR RF implanted by Dr Rayann Heman  . Eye surgery      cataracts removed on  both eyes  . Cardioversion    . Cardioversion  10/14/2012    Procedure: CARDIOVERSION;  Surgeon: Thayer Headings, MD;  Location: [facility] ENDOSCOPY;  Service: Cardiovascular;  Laterality: N/A;  . Cardioversion N/A 05/30/2013    Procedure: CARDIOVERSION;  Surgeon: Thompson Grayer, MD;  Location: [facility];  Service: Cardiovascular;  Laterality: N/A;  . Cardioversion N/A 03/03/2013    Procedure: CARDIOVERSION;  Surgeon: Deboraha Sprang, MD;  Location: [facility] CATH LAB;  Service: Cardiovascular;  Laterality: N/A;    History  Smoking status  . Never Smoker   Smokeless tobacco  . Never Used    History  Alcohol Use No    Family History  Problem Relation Age of Onset  . Stroke Mother   . Diabetes Mother   . Heart disease Mother     Unkown  . Stroke Father   . Diabetes Father   . Cancer Father     PROSTATE  . Cancer Brother     RENAL AND BLADDER  . Lung disease Brother   . Lung disease Sister   . Hypertension Daughter   . Hypertension Son     Review of Systems: The review of systems is per the HPI.  All other systems were reviewed and are negative.  Physical Exam: BP 126/64 mmHg  Pulse 69  Ht 5\' 3"  (1.6 m)  Wt 59.875 kg (132 lb)  BMI 23.39 kg/m2 Patient is very pleasant and in no acute distress.  Skin is warm and dry. Color is normal.  HEENT is unremarkable. Normocephalic/atraumatic. PERRL. Sclera are nonicteric. Neck is supple. No masses. No JVD. Lungs reveals diffuse dry crackles. Cardiac exam shows a regular rate and rhythm. Normal S1-2. No gallop or murmur. Abdomen is soft. Extremities reveal 1+ ankle edema.. Gait and ROM are intact. No gross neurologic deficits noted.  LABORATORY DATA:  Lab Results  Component Value Date   WBC 5.3 05/28/2013   HGB 12.3 05/28/2013   HCT 36.2 05/28/2013   PLT 167 05/28/2013   GLUCOSE 86 07/28/2013   ALT 26 11/23/2011   AST 31 11/23/2011   NA 131* 07/28/2013   K 4.5 07/28/2013   CL 91* 07/28/2013   CREATININE 0.8 07/28/2013   BUN 14 07/28/2013    CO2 30 07/28/2013   TSH 1.25 05/08/2013   INR 1.69* 03/02/2013    Assessment / Plan: 1. PAF - now on Tikosyn - rhythm has been stable. Recent  pacemaker check was satisfactory.  Continue Tikosyn and metoprolol. Ecg in Feb was satisfactory with Qtc 444 msec. Labs followed by Dr. Brigitte Pulse.   2. Tachy/brady - with PPM in place - followed by Dr. Rayann Heman  3. HTN - well controlled today.  4. Chronic diastolic HF - looks well compensated. Mild ankle edema but weight actually down 12 lbs. Continue current therapy. Reinforced dietary sodium restrictin.  I will follow up in 6 months.

## 2015-05-06 LAB — CUP PACEART REMOTE DEVICE CHECK
Battery Remaining Percentage: 91 %
Battery Voltage: 2.95 V
Brady Statistic AP VP Percent: 23 %
Brady Statistic AP VS Percent: 73 %
Brady Statistic RV Percent Paced: 48 %
Date Time Interrogation Session: 20160815064142
Lead Channel Impedance Value: 610 Ohm
Lead Channel Pacing Threshold Amplitude: 0.5 V
Lead Channel Pacing Threshold Amplitude: 0.75 V
Lead Channel Sensing Intrinsic Amplitude: 1 mV
Lead Channel Sensing Intrinsic Amplitude: 12 mV
Lead Channel Setting Pacing Amplitude: 2 V
Lead Channel Setting Pacing Pulse Width: 0.5 ms
MDC IDC MSMT BATTERY REMAINING LONGEVITY: 97 mo
MDC IDC MSMT LEADCHNL RA IMPEDANCE VALUE: 430 Ohm
MDC IDC MSMT LEADCHNL RA PACING THRESHOLD PULSEWIDTH: 0.8 ms
MDC IDC MSMT LEADCHNL RV PACING THRESHOLD PULSEWIDTH: 0.5 ms
MDC IDC PG SERIAL: 7280874
MDC IDC SET LEADCHNL RV PACING AMPLITUDE: 2.5 V
MDC IDC SET LEADCHNL RV SENSING SENSITIVITY: 2 mV
MDC IDC STAT BRADY AS VP PERCENT: 1 %
MDC IDC STAT BRADY AS VS PERCENT: 2.2 %
MDC IDC STAT BRADY RA PERCENT PACED: 36 %
Pulse Gen Model: 2210

## 2015-05-11 ENCOUNTER — Encounter: Payer: Self-pay | Admitting: Cardiology

## 2015-06-07 DIAGNOSIS — R350 Frequency of micturition: Secondary | ICD-10-CM | POA: Diagnosis not present

## 2015-06-07 DIAGNOSIS — N3941 Urge incontinence: Secondary | ICD-10-CM | POA: Diagnosis not present

## 2015-06-09 DIAGNOSIS — H2589 Other age-related cataract: Secondary | ICD-10-CM | POA: Diagnosis not present

## 2015-06-09 DIAGNOSIS — H4011X3 Primary open-angle glaucoma, severe stage: Secondary | ICD-10-CM | POA: Diagnosis not present

## 2015-06-09 DIAGNOSIS — Z961 Presence of intraocular lens: Secondary | ICD-10-CM | POA: Diagnosis not present

## 2015-06-09 DIAGNOSIS — H4011X1 Primary open-angle glaucoma, mild stage: Secondary | ICD-10-CM | POA: Diagnosis not present

## 2015-06-10 DIAGNOSIS — I5189 Other ill-defined heart diseases: Secondary | ICD-10-CM | POA: Diagnosis not present

## 2015-06-10 DIAGNOSIS — Z23 Encounter for immunization: Secondary | ICD-10-CM | POA: Diagnosis not present

## 2015-06-10 DIAGNOSIS — Z6824 Body mass index (BMI) 24.0-24.9, adult: Secondary | ICD-10-CM | POA: Diagnosis not present

## 2015-06-10 DIAGNOSIS — R600 Localized edema: Secondary | ICD-10-CM | POA: Diagnosis not present

## 2015-06-10 DIAGNOSIS — N390 Urinary tract infection, site not specified: Secondary | ICD-10-CM | POA: Diagnosis not present

## 2015-06-11 DIAGNOSIS — H401411 Capsular glaucoma with pseudoexfoliation of lens, right eye, mild stage: Secondary | ICD-10-CM | POA: Diagnosis not present

## 2015-06-11 DIAGNOSIS — H401422 Capsular glaucoma with pseudoexfoliation of lens, left eye, moderate stage: Secondary | ICD-10-CM | POA: Diagnosis not present

## 2015-06-16 DIAGNOSIS — R600 Localized edema: Secondary | ICD-10-CM | POA: Diagnosis not present

## 2015-06-29 DIAGNOSIS — N390 Urinary tract infection, site not specified: Secondary | ICD-10-CM | POA: Diagnosis not present

## 2015-06-29 DIAGNOSIS — R443 Hallucinations, unspecified: Secondary | ICD-10-CM | POA: Diagnosis not present

## 2015-06-30 ENCOUNTER — Encounter: Payer: Self-pay | Admitting: Podiatry

## 2015-06-30 ENCOUNTER — Ambulatory Visit (INDEPENDENT_AMBULATORY_CARE_PROVIDER_SITE_OTHER): Payer: Medicare Other | Admitting: Podiatry

## 2015-06-30 DIAGNOSIS — B351 Tinea unguium: Secondary | ICD-10-CM | POA: Diagnosis not present

## 2015-06-30 DIAGNOSIS — M79676 Pain in unspecified toe(s): Secondary | ICD-10-CM | POA: Diagnosis not present

## 2015-07-01 NOTE — Progress Notes (Signed)
Patient ID: [name], female   DOB: [birth year], 81 y.o.   MRN: [id number]  Subjective: This patient presents for a scheduled visit complaining of painful toenails monitoring shoes and requests toenail debridement  Objective: Toenails are hypertrophic, elongated, discolored, brittle and tender to direct palpation 6-10  Assessment: Symptomatic onychomycoses 6-10  Plan: Debridement toenails 10 mechanically and electrically without any bleeding  Reappoint 3 months

## 2015-07-23 DIAGNOSIS — H401423 Capsular glaucoma with pseudoexfoliation of lens, left eye, severe stage: Secondary | ICD-10-CM | POA: Diagnosis not present

## 2015-07-23 DIAGNOSIS — H401412 Capsular glaucoma with pseudoexfoliation of lens, right eye, moderate stage: Secondary | ICD-10-CM | POA: Diagnosis not present

## 2015-07-23 DIAGNOSIS — Z961 Presence of intraocular lens: Secondary | ICD-10-CM | POA: Diagnosis not present

## 2015-07-25 ENCOUNTER — Other Ambulatory Visit: Payer: Self-pay | Admitting: Cardiology

## 2015-08-02 ENCOUNTER — Ambulatory Visit (INDEPENDENT_AMBULATORY_CARE_PROVIDER_SITE_OTHER): Payer: Medicare Other | Admitting: *Deleted

## 2015-08-02 DIAGNOSIS — I495 Sick sinus syndrome: Secondary | ICD-10-CM | POA: Diagnosis not present

## 2015-08-02 NOTE — Progress Notes (Signed)
Remote pacemaker transmission.   

## 2015-08-06 DIAGNOSIS — Z1389 Encounter for screening for other disorder: Secondary | ICD-10-CM | POA: Diagnosis not present

## 2015-08-06 DIAGNOSIS — Z6827 Body mass index (BMI) 27.0-27.9, adult: Secondary | ICD-10-CM | POA: Diagnosis not present

## 2015-08-06 DIAGNOSIS — Z7901 Long term (current) use of anticoagulants: Secondary | ICD-10-CM | POA: Diagnosis not present

## 2015-08-06 DIAGNOSIS — R443 Hallucinations, unspecified: Secondary | ICD-10-CM | POA: Diagnosis not present

## 2015-08-06 DIAGNOSIS — I10 Essential (primary) hypertension: Secondary | ICD-10-CM | POA: Diagnosis not present

## 2015-08-06 DIAGNOSIS — J849 Interstitial pulmonary disease, unspecified: Secondary | ICD-10-CM | POA: Diagnosis not present

## 2015-08-06 DIAGNOSIS — I480 Paroxysmal atrial fibrillation: Secondary | ICD-10-CM | POA: Diagnosis not present

## 2015-08-06 DIAGNOSIS — Z950 Presence of cardiac pacemaker: Secondary | ICD-10-CM | POA: Diagnosis not present

## 2015-08-06 DIAGNOSIS — R600 Localized edema: Secondary | ICD-10-CM | POA: Diagnosis not present

## 2015-08-11 LAB — CUP PACEART REMOTE DEVICE CHECK
Battery Remaining Longevity: 88 mo
Battery Voltage: 2.93 V
Brady Statistic AS VP Percent: 1.1 %
Brady Statistic RA Percent Paced: 26 %
Brady Statistic RV Percent Paced: 51 %
Implantable Lead Implant Date: 20121016
Implantable Lead Location: 753859
Implantable Lead Location: 753860
Implantable Lead Model: 1948
Lead Channel Impedance Value: 610 Ohm
Lead Channel Pacing Threshold Amplitude: 0.5 V
Lead Channel Pacing Threshold Pulse Width: 0.5 ms
Lead Channel Sensing Intrinsic Amplitude: 0.5 mV
Lead Channel Sensing Intrinsic Amplitude: 12 mV
Lead Channel Setting Pacing Amplitude: 2 V
Lead Channel Setting Pacing Amplitude: 2.5 V
Lead Channel Setting Pacing Pulse Width: 0.5 ms
MDC IDC LEAD IMPLANT DT: 20121016
MDC IDC MSMT BATTERY REMAINING PERCENTAGE: 81 %
MDC IDC MSMT LEADCHNL RA IMPEDANCE VALUE: 400 Ohm
MDC IDC MSMT LEADCHNL RA PACING THRESHOLD AMPLITUDE: 0.75 V
MDC IDC MSMT LEADCHNL RA PACING THRESHOLD PULSEWIDTH: 0.8 ms
MDC IDC SESS DTM: 20161114071023
MDC IDC SET LEADCHNL RV SENSING SENSITIVITY: 2 mV
MDC IDC STAT BRADY AP VP PERCENT: 27 %
MDC IDC STAT BRADY AP VS PERCENT: 68 %
MDC IDC STAT BRADY AS VS PERCENT: 2.6 %
Pulse Gen Model: 2210
Pulse Gen Serial Number: 7280874

## 2015-08-16 DIAGNOSIS — H401423 Capsular glaucoma with pseudoexfoliation of lens, left eye, severe stage: Secondary | ICD-10-CM | POA: Diagnosis not present

## 2015-08-16 DIAGNOSIS — H409 Unspecified glaucoma: Secondary | ICD-10-CM | POA: Diagnosis not present

## 2015-08-18 ENCOUNTER — Encounter: Payer: Self-pay | Admitting: Cardiology

## 2015-08-19 DIAGNOSIS — R32 Unspecified urinary incontinence: Secondary | ICD-10-CM | POA: Diagnosis not present

## 2015-08-19 DIAGNOSIS — N390 Urinary tract infection, site not specified: Secondary | ICD-10-CM | POA: Diagnosis not present

## 2015-08-19 DIAGNOSIS — R350 Frequency of micturition: Secondary | ICD-10-CM | POA: Diagnosis not present

## 2015-08-21 ENCOUNTER — Other Ambulatory Visit: Payer: Self-pay | Admitting: Internal Medicine

## 2015-09-03 ENCOUNTER — Other Ambulatory Visit: Payer: Self-pay | Admitting: Internal Medicine

## 2015-09-03 DIAGNOSIS — Z6824 Body mass index (BMI) 24.0-24.9, adult: Secondary | ICD-10-CM | POA: Diagnosis not present

## 2015-09-03 DIAGNOSIS — J849 Interstitial pulmonary disease, unspecified: Secondary | ICD-10-CM | POA: Diagnosis not present

## 2015-09-03 DIAGNOSIS — I10 Essential (primary) hypertension: Secondary | ICD-10-CM | POA: Diagnosis not present

## 2015-09-03 DIAGNOSIS — R0609 Other forms of dyspnea: Secondary | ICD-10-CM | POA: Diagnosis not present

## 2015-09-03 DIAGNOSIS — I5189 Other ill-defined heart diseases: Secondary | ICD-10-CM | POA: Diagnosis not present

## 2015-09-09 DIAGNOSIS — I10 Essential (primary) hypertension: Secondary | ICD-10-CM | POA: Diagnosis not present

## 2015-09-14 DIAGNOSIS — E871 Hypo-osmolality and hyponatremia: Secondary | ICD-10-CM | POA: Diagnosis not present

## 2015-09-14 DIAGNOSIS — I5189 Other ill-defined heart diseases: Secondary | ICD-10-CM | POA: Diagnosis not present

## 2015-09-14 DIAGNOSIS — I10 Essential (primary) hypertension: Secondary | ICD-10-CM | POA: Diagnosis not present

## 2015-09-14 DIAGNOSIS — R5383 Other fatigue: Secondary | ICD-10-CM | POA: Diagnosis not present

## 2015-09-14 DIAGNOSIS — Z6822 Body mass index (BMI) 22.0-22.9, adult: Secondary | ICD-10-CM | POA: Diagnosis not present

## 2015-09-14 DIAGNOSIS — Z7901 Long term (current) use of anticoagulants: Secondary | ICD-10-CM | POA: Diagnosis not present

## 2015-09-14 DIAGNOSIS — I480 Paroxysmal atrial fibrillation: Secondary | ICD-10-CM | POA: Diagnosis not present

## 2015-09-14 DIAGNOSIS — R600 Localized edema: Secondary | ICD-10-CM | POA: Diagnosis not present

## 2015-09-14 DIAGNOSIS — R0609 Other forms of dyspnea: Secondary | ICD-10-CM | POA: Diagnosis not present

## 2015-09-17 ENCOUNTER — Other Ambulatory Visit: Payer: Self-pay | Admitting: Internal Medicine

## 2015-09-28 DIAGNOSIS — E871 Hypo-osmolality and hyponatremia: Secondary | ICD-10-CM | POA: Diagnosis not present

## 2015-10-05 DIAGNOSIS — D485 Neoplasm of uncertain behavior of skin: Secondary | ICD-10-CM | POA: Diagnosis not present

## 2015-10-05 DIAGNOSIS — D0462 Carcinoma in situ of skin of left upper limb, including shoulder: Secondary | ICD-10-CM | POA: Diagnosis not present

## 2015-10-05 DIAGNOSIS — D225 Melanocytic nevi of trunk: Secondary | ICD-10-CM | POA: Diagnosis not present

## 2015-10-05 DIAGNOSIS — L814 Other melanin hyperpigmentation: Secondary | ICD-10-CM | POA: Diagnosis not present

## 2015-10-05 DIAGNOSIS — L821 Other seborrheic keratosis: Secondary | ICD-10-CM | POA: Diagnosis not present

## 2015-10-05 DIAGNOSIS — L570 Actinic keratosis: Secondary | ICD-10-CM | POA: Diagnosis not present

## 2015-10-05 DIAGNOSIS — L853 Xerosis cutis: Secondary | ICD-10-CM | POA: Diagnosis not present

## 2015-10-05 DIAGNOSIS — D1801 Hemangioma of skin and subcutaneous tissue: Secondary | ICD-10-CM | POA: Diagnosis not present

## 2015-10-05 DIAGNOSIS — Z85828 Personal history of other malignant neoplasm of skin: Secondary | ICD-10-CM | POA: Diagnosis not present

## 2015-10-13 ENCOUNTER — Encounter: Payer: Self-pay | Admitting: Podiatry

## 2015-10-13 ENCOUNTER — Ambulatory Visit (INDEPENDENT_AMBULATORY_CARE_PROVIDER_SITE_OTHER): Payer: Medicare Other | Admitting: Podiatry

## 2015-10-13 DIAGNOSIS — M79676 Pain in unspecified toe(s): Secondary | ICD-10-CM | POA: Diagnosis not present

## 2015-10-13 DIAGNOSIS — B351 Tinea unguium: Secondary | ICD-10-CM | POA: Diagnosis not present

## 2015-10-14 NOTE — Progress Notes (Signed)
Patient ID: [name], female   DOB: 02-20-1925, 82 y.o.   MRN: [id number]  Subjective: As patient presents again today for scheduled visit complaining of elongated and thickened toenails which are cough or walking wearing shoes and request toenail debridement  Objective: No open skin lesions bilaterally The toenails are elongated, discolored, hypertrophic and tender to direct palpation 6-10  Assessment: Symptomatic onychomycoses 6-10  Plan: Debridement toenails 6-10 mechanically and likely without any bleeding  Reappoint 3 month

## 2015-10-27 ENCOUNTER — Encounter: Payer: Self-pay | Admitting: *Deleted

## 2015-11-05 ENCOUNTER — Encounter: Payer: Self-pay | Admitting: Internal Medicine

## 2015-11-05 ENCOUNTER — Ambulatory Visit (INDEPENDENT_AMBULATORY_CARE_PROVIDER_SITE_OTHER): Payer: Medicare Other | Admitting: Internal Medicine

## 2015-11-05 VITALS — BP 148/72 | HR 60 | Ht 63.0 in | Wt 128.6 lb

## 2015-11-05 DIAGNOSIS — I10 Essential (primary) hypertension: Secondary | ICD-10-CM

## 2015-11-05 DIAGNOSIS — I495 Sick sinus syndrome: Secondary | ICD-10-CM

## 2015-11-05 DIAGNOSIS — I480 Paroxysmal atrial fibrillation: Secondary | ICD-10-CM

## 2015-11-05 DIAGNOSIS — I4891 Unspecified atrial fibrillation: Secondary | ICD-10-CM | POA: Diagnosis not present

## 2015-11-05 LAB — CBC WITH DIFFERENTIAL/PLATELET
BASOS PCT: 0 % (ref 0–1)
Basophils Absolute: 0 10*3/uL (ref 0.0–0.1)
EOS ABS: 0.1 10*3/uL (ref 0.0–0.7)
Eosinophils Relative: 1 % (ref 0–5)
HCT: 40.1 % (ref 36.0–46.0)
Hemoglobin: 13.4 g/dL (ref 12.0–15.0)
Lymphocytes Relative: 12 % (ref 12–46)
Lymphs Abs: 0.8 10*3/uL (ref 0.7–4.0)
MCH: 30.3 pg (ref 26.0–34.0)
MCHC: 33.4 g/dL (ref 30.0–36.0)
MCV: 90.7 fL (ref 78.0–100.0)
MONOS PCT: 12 % (ref 3–12)
MPV: 9.8 fL (ref 8.6–12.4)
Monocytes Absolute: 0.8 10*3/uL (ref 0.1–1.0)
NEUTROS ABS: 5.1 10*3/uL (ref 1.7–7.7)
Neutrophils Relative %: 75 % (ref 43–77)
PLATELETS: 196 10*3/uL (ref 150–400)
RBC: 4.42 MIL/uL (ref 3.87–5.11)
RDW: 15.6 % — ABNORMAL HIGH (ref 11.5–15.5)
WBC: 6.8 10*3/uL (ref 4.0–10.5)

## 2015-11-05 LAB — CUP PACEART INCLINIC DEVICE CHECK
Battery Voltage: 2.93 V
Brady Statistic RA Percent Paced: 43 %
Brady Statistic RV Percent Paced: 42 %
Date Time Interrogation Session: 20170217122938
Implantable Lead Implant Date: 20121016
Implantable Lead Implant Date: 20121016
Implantable Lead Location: 753859
Implantable Lead Location: 753860
Implantable Lead Model: 1948
Lead Channel Impedance Value: 412.5 Ohm
Lead Channel Impedance Value: 662.5 Ohm
Lead Channel Pacing Threshold Amplitude: 0.75 V
Lead Channel Pacing Threshold Amplitude: 1 V
Lead Channel Pacing Threshold Amplitude: 1 V
Lead Channel Pacing Threshold Pulse Width: 0.5 ms
Lead Channel Pacing Threshold Pulse Width: 0.5 ms
Lead Channel Pacing Threshold Pulse Width: 0.8 ms
Lead Channel Pacing Threshold Pulse Width: 0.8 ms
Lead Channel Sensing Intrinsic Amplitude: 1 mV
Lead Channel Setting Pacing Amplitude: 2.5 V
Lead Channel Setting Pacing Pulse Width: 0.5 ms
MDC IDC MSMT BATTERY REMAINING LONGEVITY: 90
MDC IDC MSMT LEADCHNL RV PACING THRESHOLD AMPLITUDE: 0.75 V
MDC IDC MSMT LEADCHNL RV SENSING INTR AMPL: 12 mV
MDC IDC SET LEADCHNL RA PACING AMPLITUDE: 2 V
MDC IDC SET LEADCHNL RV SENSING SENSITIVITY: 2 mV
Pulse Gen Model: 2210
Pulse Gen Serial Number: 7280874

## 2015-11-05 LAB — BASIC METABOLIC PANEL
BUN: 14 mg/dL (ref 7–25)
CALCIUM: 9.4 mg/dL (ref 8.6–10.4)
CO2: 30 mmol/L (ref 20–31)
Chloride: 92 mmol/L — ABNORMAL LOW (ref 98–110)
Creat: 0.7 mg/dL (ref 0.60–0.88)
Glucose, Bld: 79 mg/dL (ref 65–99)
POTASSIUM: 4.5 mmol/L (ref 3.5–5.3)
SODIUM: 129 mmol/L — AB (ref 135–146)

## 2015-11-05 LAB — MAGNESIUM: MAGNESIUM: 1.9 mg/dL (ref 1.5–2.5)

## 2015-11-05 NOTE — Patient Instructions (Addendum)
Medication Instructions:  Your physician recommends that you continue on your current medications as directed. Please refer to the Current Medication list given to you today.   Labwork: Your physician recommends that you return for lab work today: BMP/Mag/CBC    Testing/Procedures: None ordered   Follow-Up: Remote monitoring is used to monitor your Pacemaker  from home. This monitoring reduces the number of office visits required to check your device to one time per year. It allows [place] to keep an eye on the functioning of your device to ensure it is working properly. You are scheduled for a device check from home on 02/07/16. You may send your transmission at any time that day. If you have a wireless device, the transmission will be sent automatically. After your physician reviews your transmission, you will receive a postcard with your next transmission date.   Your physician wants you to follow-up in: 12 months with Dr Vallery Ridge will receive a reminder letter in the mail two months in advance. If you don't receive a letter, please call our office to schedule the follow-up appointment.    Any Other Special Instructions Will Be Listed Below (If Applicable).     If you need a refill on your cardiac medications before your next appointment, please call your pharmacy.

## 2015-11-05 NOTE — Progress Notes (Signed)
Electrophysiology Office Note   Date:  11/05/2015   ID:  [name], DOB 07-Feb-1925, MRN [id number]  PCP:  Marton Redwood, MD  Cardiologist:  Dr Martinique Primary Electrophysiologist: Thompson Grayer, MD    Chief Complaint  Patient presents with  . Atrial Fibrillation     History of Present Illness: [name] is a 82 y.o. female who presents today for electrophysiology evaluation.   Her primary concern is with fatigue.  This is chronic.  She is not very active.  She spent most of the summer in AF but has been back in sinus since November (by ppm interrogation).  She was apparently in excessive diamox (per spouse) and had a bad summer overall.  Since stopping this medicine, she has done "better". Today, she denies symptoms of palpitations, chest pain, shortness of breath, orthopnea, PND, lower extremity edema, claudication, dizziness, presyncope, syncope, bleeding, or neurologic sequela. The patient is tolerating medications without difficulties and is otherwise without complaint today.    Past Medical History  Diagnosis Date  . Pneumonia 04/2010  . Hypertension   . Renal artery stenosis ([place]) 2008    STATUS POST ANGIOPLASTY  . GERD (gastroesophageal reflux disease)   . Back pain     CHRONIC  . Atrial fibrillation [facility])     Prior TEE/CV in May 2012; managed with rate control and anticoagulation  . Hyponatremia     improved  . Seizure disorder ([place])     on anti-epileptic therapy  . Urinary incontinence   . Osteopenia   . SIADH (syndrome of inappropriate ADH production) ([place])     h/o HYPONATREMIA ; LIKELY RELATED TO HER LUNG PROCESS  . Arthritis   . Hiatal hernia   . Tachy-brady syndrome [facility])     with pauses noted in May 2012; s/p PTVP October 2012  . Dysphagia   . (HFpEF) heart failure with preserved ejection fraction ([place])   . Pacemaker    Past Surgical History  Procedure Laterality Date  . Cholecystectomy    . Back surgery      X2  . Total knee arthroplasty  Bilateral   . Tonsillectomy    . Appendectomy    . Cardiac catheterization  2007    Normal  . Knee arthroscopy Left   . Cataract extraction    . Shoulder surgery    . Renal angioplasty Right 01/2007  . Partial hysterectomy    . Pacemaker insertion  06/2011    SJM Accent DR RF implanted by Dr Rayann Heman  . Cataract extraction Bilateral   . Cardioversion    . Cardioversion  10/14/2012    Procedure: CARDIOVERSION;  Surgeon: Thayer Headings, MD;  Location: [facility] ENDOSCOPY;  Service: Cardiovascular;  Laterality: N/A;  . Cardioversion N/A 05/30/2013    Procedure: CARDIOVERSION;  Surgeon: Thompson Grayer, MD;  Location: [place];  Service: Cardiovascular;  Laterality: N/A;  . Cardioversion N/A 03/03/2013    Procedure: CARDIOVERSION;  Surgeon: Deboraha Sprang, MD;  Location: [facility] CATH LAB;  Service: Cardiovascular;  Laterality: N/A;     Current Outpatient Prescriptions  Medication Sig Dispense Refill  . Brimonidine Tartrate (ALPHAGAN P OP) Place 1 drop into the right eye daily.    . Calcium Citrate (CITRACAL PO) Take 1 tablet by mouth 2 (two) times daily.     . Cholecalciferol (VITAMIN D) 400 UNITS capsule Take 400 Units by mouth 3 (three) times daily.    . dabigatran (PRADAXA) 75 MG CAPS capsule Take 1 capsule (75  mg total) by mouth every 12 (twelve) hours. 180 capsule 0  . divalproex (DEPAKOTE) 500 MG EC tablet Take 500 mg by mouth at bedtime.      . dofetilide (TIKOSYN) 250 MCG capsule Take 250 mcg by mouth 2 (two) times daily.    . DORZOLAMIDE HCL OP Place 1 drop into the left eye 3 (three) times daily.    Marland Kitchen esomeprazole (NEXIUM) 20 MG capsule Take 40 mg by mouth daily before breakfast.    . furosemide (LASIX) 40 MG tablet Take 40 mg by mouth daily.    Marland Kitchen HYDROcodone-acetaminophen (VICODIN) 5-500 MG per tablet Take 1 tablet by mouth every 6 (six) hours as needed. For pain     . metoprolol tartrate (LOPRESSOR) 25 MG tablet Take 1 tablet (25 mg total) by mouth 2 (two) times daily. 180 tablet 2  .  mirabegron ER (MYRBETRIQ) 50 MG TB24 tablet Take 50 mg by mouth daily.    . Multiple Vitamins-Minerals (MULTIVITAMINS THER. W/MINERALS) TABS Take 1 tablet by mouth daily.      Marland Kitchen olmesartan (BENICAR) 40 MG tablet Take 40 mg by mouth daily.    . polyethylene glycol powder (GLYCOLAX/MIRALAX) powder Take 17 g by mouth daily as needed. For constipation    . potassium chloride (KLOR-CON M10) 10 MEQ tablet Take 1 tablet (10 mEq total) by mouth 2 (two) times daily. 180 tablet 0  . tolterodine (DETROL LA) 4 MG 24 hr capsule Take 4 mg by mouth every evening.      No current facility-administered medications for this visit.    Allergies:   Amiodarone   Social History:  The patient  reports that she has never smoked. She has never used smokeless tobacco. She reports that she does not drink alcohol or use illicit drugs.   Family History:  The patient's family history includes Bladder Cancer in her brother; Diabetes in her father and mother; Heart disease in her mother; Hypertension in her daughter and son; Kidney failure in her brother; Lung disease in her brother and sister; Prostate cancer in her father; Stroke in her father and mother.    ROS:  Please see the history of present illness.   All other systems are reviewed and negative.    PHYSICAL EXAM: VS:  BP 148/72 mmHg  Pulse 60  Ht 5\' 3"  (1.6 m)  Wt 128 lb 9.6 oz (58.333 kg)  BMI 22.79 kg/m2 , BMI Body mass index is 22.79 kg/(m^2). GEN: thin and frail appearing in no acute distress, in a wheelchair today HEENT: normal Neck: no JVD, carotid bruits, or masses Cardiac: RRR; no murmurs, rubs, or gallops,no edema  Respiratory:  clear to auscultation bilaterally, normal work of breathing GI: soft, nontender, nondistended, + BS MS: no deformity or atrophy Skin: warm and dry, device pocket is well healed Neuro:  Strength and sensation are intact Psych: euthymic mood, full affect  EKG:  EKG is ordered today. The ekg ordered today shows A pacing  at 60 bpm, qtc 452 msec   Device interrogation is reviewed today in detail.  See PaceArt for details.   Recent Labs: No results found for requested labs within last 365 days.    Lipid Panel  No results found for: CHOL, TRIG, HDL, CHOLHDL, VLDL, LDLCALC, LDLDIRECT   Wt Readings from Last 3 Encounters:  11/05/15 128 lb 9.6 oz (58.333 kg)  05/05/15 132 lb (59.875 kg)  02/26/15 140 lb (63.504 kg)      ASSESSMENT AND PLAN:  1.  Persistent afib Doing very well with tikosyn Labs reviewed in epic chads2vasc score is at least 5.  She is doing well with pradaxa Cbc and bmet, mg today  2. HTN Stable No change required today bmet today  3. Tachy/brady Normal pacemaker function See Pace Art report No changes today Continue to follow remotely with merlin  Current medicines are reviewed at length with the patient today.   The patient does not have concerns regarding her medicines.  The following changes were made today:  none  Follow-up: merlin Return to see me in 1 year Follow-up with Dr Martinique as scheduled PCP to follow CBC, BMET, Mg at least twice per year  Signed, Thompson Grayer, MD  11/05/2015 12:08 PM     [place] [address] [place] [address] [phone] (office) [phone] (fax)

## 2015-11-09 DIAGNOSIS — Z7901 Long term (current) use of anticoagulants: Secondary | ICD-10-CM | POA: Diagnosis not present

## 2015-11-09 DIAGNOSIS — M25511 Pain in right shoulder: Secondary | ICD-10-CM | POA: Diagnosis not present

## 2015-11-09 DIAGNOSIS — I5189 Other ill-defined heart diseases: Secondary | ICD-10-CM | POA: Diagnosis not present

## 2015-11-09 DIAGNOSIS — I10 Essential (primary) hypertension: Secondary | ICD-10-CM | POA: Diagnosis not present

## 2015-11-09 DIAGNOSIS — J849 Interstitial pulmonary disease, unspecified: Secondary | ICD-10-CM | POA: Diagnosis not present

## 2015-11-09 DIAGNOSIS — I480 Paroxysmal atrial fibrillation: Secondary | ICD-10-CM | POA: Diagnosis not present

## 2015-11-09 DIAGNOSIS — E871 Hypo-osmolality and hyponatremia: Secondary | ICD-10-CM | POA: Diagnosis not present

## 2015-11-09 DIAGNOSIS — Z950 Presence of cardiac pacemaker: Secondary | ICD-10-CM | POA: Diagnosis not present

## 2015-11-09 DIAGNOSIS — M859 Disorder of bone density and structure, unspecified: Secondary | ICD-10-CM | POA: Diagnosis not present

## 2015-11-19 ENCOUNTER — Other Ambulatory Visit: Payer: Self-pay | Admitting: Internal Medicine

## 2015-11-25 DIAGNOSIS — M25511 Pain in right shoulder: Secondary | ICD-10-CM | POA: Diagnosis not present

## 2015-12-02 ENCOUNTER — Other Ambulatory Visit: Payer: Self-pay | Admitting: Internal Medicine

## 2015-12-02 DIAGNOSIS — Z961 Presence of intraocular lens: Secondary | ICD-10-CM | POA: Diagnosis not present

## 2015-12-02 DIAGNOSIS — H2589 Other age-related cataract: Secondary | ICD-10-CM | POA: Diagnosis not present

## 2015-12-02 DIAGNOSIS — H401433 Capsular glaucoma with pseudoexfoliation of lens, bilateral, severe stage: Secondary | ICD-10-CM | POA: Diagnosis not present

## 2015-12-22 ENCOUNTER — Other Ambulatory Visit: Payer: Self-pay | Admitting: Internal Medicine

## 2015-12-27 ENCOUNTER — Ambulatory Visit (INDEPENDENT_AMBULATORY_CARE_PROVIDER_SITE_OTHER): Payer: Medicare Other | Admitting: Cardiology

## 2015-12-27 ENCOUNTER — Encounter: Payer: Self-pay | Admitting: Cardiology

## 2015-12-27 VITALS — BP 144/80 | HR 60 | Ht 63.0 in | Wt 125.6 lb

## 2015-12-27 DIAGNOSIS — Z7901 Long term (current) use of anticoagulants: Secondary | ICD-10-CM

## 2015-12-27 DIAGNOSIS — I480 Paroxysmal atrial fibrillation: Secondary | ICD-10-CM

## 2015-12-27 DIAGNOSIS — I10 Essential (primary) hypertension: Secondary | ICD-10-CM

## 2015-12-27 DIAGNOSIS — I5032 Chronic diastolic (congestive) heart failure: Secondary | ICD-10-CM

## 2015-12-27 DIAGNOSIS — I495 Sick sinus syndrome: Secondary | ICD-10-CM

## 2015-12-27 NOTE — Progress Notes (Signed)
[name] Date of Birth: 11-Jul-1925 Medical Record [id number]  History of Present Illness: [name] is seen back today for follow up of CHF and sick sinus syndrome.  She has chronic diastolic CHF, tachy brady with PPM in place, past cardioversions in October of 2012 and again in January of 2014. Recurrent atrial fib in June 2014 and was cardioverted and started on Multaq. Has been intolerant to amiodarone due to possible pulmonary toxicity. Then failed on Multaq due to recurrent arrhythmia. Has been  on Tikosyn.  On follow up today she states she is doing ok. She does complain of having no energy. She has intermittent dyspnea and equates this with being out of rhythm. No significant edema. No chest pain or palpitations. No dizziness. Weight continues to decline. On her last pacer check she was noted to have mode switches 54% of the time (down from 73 % in November).    Current Outpatient Prescriptions  Medication Sig Dispense Refill  . Calcium Citrate (CITRACAL PO) Take 1 tablet by mouth 2 (two) times daily.     . Cholecalciferol (VITAMIN D) 400 UNITS capsule Take 400 Units by mouth 3 (three) times daily.    . dabigatran (PRADAXA) 75 MG CAPS capsule Take 1 capsule (75 mg total) by mouth every 12 (twelve) hours. 180 capsule 3  . divalproex (DEPAKOTE) 500 MG EC tablet Take 500 mg by mouth at bedtime.      . dofetilide (TIKOSYN) 250 MCG capsule Take 250 mcg by mouth 2 (two) times daily.    . DORZOLAMIDE HCL OP Place 1 drop into the left eye 3 (three) times daily.    . dorzolamide-timolol (COSOPT) 22.3-6.8 MG/ML ophthalmic solution INT 1 GTT INTO OD BID  4  . furosemide (LASIX) 40 MG tablet TAKE 1 TABLET DAILY 90 tablet 3  . HYDROcodone-acetaminophen (VICODIN) 5-500 MG per tablet Take 1 tablet by mouth every 6 (six) hours as needed. For pain     . KLOR-CON M10 10 MEQ tablet TAKE 1 TABLET TWICE A DAY 180 tablet 3  . metoprolol tartrate (LOPRESSOR) 25 MG tablet Take 1 tablet (25 mg total) by  mouth 2 (two) times daily. 180 tablet 2  . mirabegron ER (MYRBETRIQ) 50 MG TB24 tablet Take 50 mg by mouth daily.    . Multiple Vitamins-Minerals (MULTIVITAMINS THER. W/MINERALS) TABS Take 1 tablet by mouth daily.      Marland Kitchen olmesartan (BENICAR) 40 MG tablet Take 40 mg by mouth daily.    . polyethylene glycol powder (GLYCOLAX/MIRALAX) powder Take 17 g by mouth daily as needed. For constipation    . tolterodine (DETROL LA) 4 MG 24 hr capsule Take 4 mg by mouth every evening.     Marland Kitchen NEXIUM 40 MG capsule Take 1 tablet by mouth daily.     No current facility-administered medications for this visit.    Allergies  Allergen Reactions  . Amiodarone     Possibly caused pneumonitis    Past Medical History  Diagnosis Date  . Pneumonia 04/2010  . Hypertension   . Renal artery stenosis ([place]) 2008    STATUS POST ANGIOPLASTY  . GERD (gastroesophageal reflux disease)   . Back pain     CHRONIC  . Atrial fibrillation [facility])     Prior TEE/CV in May 2012; managed with rate control and anticoagulation  . Hyponatremia     improved  . Seizure disorder ([place])     on anti-epileptic therapy  . Urinary incontinence   . Osteopenia   .  SIADH (syndrome of inappropriate ADH production) ([place])     h/o HYPONATREMIA ; LIKELY RELATED TO HER LUNG PROCESS  . Arthritis   . Hiatal hernia   . Tachy-brady syndrome [facility])     with pauses noted in May 2012; s/p PTVP October 2012  . Dysphagia   . (HFpEF) heart failure with preserved ejection fraction ([place])   . Pacemaker     Past Surgical History  Procedure Laterality Date  . Cholecystectomy    . Back surgery      X2  . Total knee arthroplasty Bilateral   . Tonsillectomy    . Appendectomy    . Cardiac catheterization  2007    Normal  . Knee arthroscopy Left   . Cataract extraction    . Shoulder surgery    . Renal angioplasty Right 01/2007  . Partial hysterectomy    . Pacemaker insertion  06/2011    SJM Accent DR RF implanted by Dr Rayann Heman  . Cataract extraction  Bilateral   . Cardioversion    . Cardioversion  10/14/2012    Procedure: CARDIOVERSION;  Surgeon: Thayer Headings, MD;  Location: [facility] ENDOSCOPY;  Service: Cardiovascular;  Laterality: N/A;  . Cardioversion N/A 05/30/2013    Procedure: CARDIOVERSION;  Surgeon: Thompson Grayer, MD;  Location: [place];  Service: Cardiovascular;  Laterality: N/A;  . Cardioversion N/A 03/03/2013    Procedure: CARDIOVERSION;  Surgeon: Deboraha Sprang, MD;  Location: [facility] CATH LAB;  Service: Cardiovascular;  Laterality: N/A;    History  Smoking status  . Never Smoker   Smokeless tobacco  . Never Used    History  Alcohol Use No    Family History  Problem Relation Age of Onset  . Stroke Mother   . Diabetes Mother   . Heart disease Mother     Unkown  . Stroke Father   . Diabetes Father   . Prostate cancer Father   . Kidney failure Brother   . Lung disease Brother   . Lung disease Sister   . Hypertension Daughter   . Hypertension Son   . Bladder Cancer Brother     Review of Systems: The review of systems is per the HPI.  All other systems were reviewed and are negative.  Physical Exam: BP 144/80 mmHg  Pulse 60  Ht 5\' 3"  (1.6 m)  Wt 56.972 kg (125 lb 9.6 oz)  BMI 22.25 kg/m2  SpO2 94% Patient is  Pleasant, elderly  and in no acute distress.  Skin is warm and dry. Color is normal.  HEENT is unremarkable. Normocephalic/atraumatic. PERRL. Sclera are nonicteric. Neck is supple. No masses. No JVD. Lungs are clear. Cardiac exam shows a regular rate and rhythm. Normal S1-2. No gallop or murmur. Abdomen is soft. Extremities reveal tr ankle edema.. Gait and ROM are intact. No gross neurologic deficits noted.  LABORATORY DATA:  Lab Results  Component Value Date   WBC 6.8 11/05/2015   HGB 13.4 11/05/2015   HCT 40.1 11/05/2015   PLT 196 11/05/2015   GLUCOSE 79 11/05/2015   ALT 26 11/23/2011   AST 31 11/23/2011   NA 129* 11/05/2015   K 4.5 11/05/2015   CL 92* 11/05/2015   CREATININE 0.70 11/05/2015   BUN  14 11/05/2015   CO2 30 11/05/2015   TSH 1.25 05/08/2013   INR 1.69* 03/02/2013    Assessment / Plan: 1. PAF - now on Tikosyn -  Recent pacemaker check was satisfactory.  Continue Tikosyn and metoprolol. Still having  frequent Afib but little else to offer. Rate is well controlled.   2. Tachy/brady - with PPM in place - followed by Dr. Rayann Heman  3. HTN - well controlled today.  4. Chronic diastolic HF - looks well compensated. No significant edema and weight continues to decline. Continue current therapy. Reinforced dietary sodium restrictin.  I will follow up in 6 months.

## 2015-12-27 NOTE — Patient Instructions (Signed)
Continue your current therapy  I will see you in 6 months.   

## 2015-12-29 DIAGNOSIS — Z85828 Personal history of other malignant neoplasm of skin: Secondary | ICD-10-CM | POA: Diagnosis not present

## 2015-12-29 DIAGNOSIS — M859 Disorder of bone density and structure, unspecified: Secondary | ICD-10-CM | POA: Diagnosis not present

## 2015-12-29 DIAGNOSIS — T1490 Injury, unspecified: Secondary | ICD-10-CM | POA: Diagnosis not present

## 2015-12-29 DIAGNOSIS — D044 Carcinoma in situ of skin of scalp and neck: Secondary | ICD-10-CM | POA: Diagnosis not present

## 2015-12-29 DIAGNOSIS — L821 Other seborrheic keratosis: Secondary | ICD-10-CM | POA: Diagnosis not present

## 2015-12-29 DIAGNOSIS — D485 Neoplasm of uncertain behavior of skin: Secondary | ICD-10-CM | POA: Diagnosis not present

## 2016-01-11 DIAGNOSIS — R350 Frequency of micturition: Secondary | ICD-10-CM | POA: Diagnosis not present

## 2016-01-11 DIAGNOSIS — Z0000 Encounter for general adult medical examination without abnormal findings: Secondary | ICD-10-CM | POA: Diagnosis not present

## 2016-01-11 DIAGNOSIS — N3941 Urge incontinence: Secondary | ICD-10-CM | POA: Diagnosis not present

## 2016-01-12 ENCOUNTER — Ambulatory Visit (INDEPENDENT_AMBULATORY_CARE_PROVIDER_SITE_OTHER): Payer: Medicare Other | Admitting: Podiatry

## 2016-01-12 ENCOUNTER — Encounter: Payer: Self-pay | Admitting: Podiatry

## 2016-01-12 DIAGNOSIS — M79676 Pain in unspecified toe(s): Secondary | ICD-10-CM | POA: Diagnosis not present

## 2016-01-12 DIAGNOSIS — B351 Tinea unguium: Secondary | ICD-10-CM

## 2016-01-12 DIAGNOSIS — H401433 Capsular glaucoma with pseudoexfoliation of lens, bilateral, severe stage: Secondary | ICD-10-CM | POA: Diagnosis not present

## 2016-01-13 NOTE — Progress Notes (Signed)
Patient ID: [name], female   DOB: 12-23-1924, 82 y.o.   MRN: [id number]   Subjective: This patient presents again today for scheduled visit complaining of elongated and thickened toenails which are uncomfortable when walking and wearing shoes and request toenail debridement  Objective: No open skin lesions bilaterally The toenails are elongated, discolored, hypertrophic and tender to direct palpation 6-10  Assessment: Symptomatic onychomycoses 6-10  Plan: Debridement toenails 6-10 mechanically and electronically without any bleeding  Reappoint 3 month

## 2016-02-07 ENCOUNTER — Ambulatory Visit (INDEPENDENT_AMBULATORY_CARE_PROVIDER_SITE_OTHER): Payer: Medicare Other | Admitting: *Deleted

## 2016-02-07 DIAGNOSIS — I495 Sick sinus syndrome: Secondary | ICD-10-CM | POA: Diagnosis not present

## 2016-02-07 NOTE — Progress Notes (Signed)
Remote pacemaker transmission.   

## 2016-02-10 ENCOUNTER — Other Ambulatory Visit: Payer: Self-pay | Admitting: Internal Medicine

## 2016-02-17 DIAGNOSIS — M859 Disorder of bone density and structure, unspecified: Secondary | ICD-10-CM | POA: Diagnosis not present

## 2016-02-17 DIAGNOSIS — K219 Gastro-esophageal reflux disease without esophagitis: Secondary | ICD-10-CM | POA: Diagnosis not present

## 2016-02-25 ENCOUNTER — Other Ambulatory Visit (HOSPITAL_COMMUNITY): Payer: Self-pay | Admitting: *Deleted

## 2016-02-28 ENCOUNTER — Ambulatory Visit (HOSPITAL_COMMUNITY)
Admission: RE | Admit: 2016-02-28 | Discharge: 2016-02-28 | Disposition: A | Discharge status: Home / Self Care | Payer: Medicare Other | Source: Ambulatory Visit | Attending: Internal Medicine | Admitting: Internal Medicine

## 2016-02-28 DIAGNOSIS — M810 Age-related osteoporosis without current pathological fracture: Secondary | ICD-10-CM | POA: Insufficient documentation

## 2016-02-28 LAB — CUP PACEART REMOTE DEVICE CHECK
Battery Voltage: 2.92 V
Brady Statistic AS VP Percent: 1 %
Brady Statistic RA Percent Paced: 93 %
Implantable Lead Implant Date: 20121016
Implantable Lead Implant Date: 20121016
Lead Channel Impedance Value: 430 Ohm
Lead Channel Sensing Intrinsic Amplitude: 1.2 mV
Lead Channel Sensing Intrinsic Amplitude: 12 mV
Lead Channel Setting Pacing Pulse Width: 0.5 ms
Lead Channel Setting Sensing Sensitivity: 2 mV
MDC IDC LEAD LOCATION: 753859
MDC IDC LEAD LOCATION: 753860
MDC IDC LEAD MODEL: 1948
MDC IDC MSMT BATTERY REMAINING LONGEVITY: 73 mo
MDC IDC MSMT BATTERY REMAINING PERCENTAGE: 73 %
MDC IDC MSMT LEADCHNL RV IMPEDANCE VALUE: 650 Ohm
MDC IDC SESS DTM: 20170522061045
MDC IDC SET LEADCHNL RA PACING AMPLITUDE: 2 V
MDC IDC SET LEADCHNL RV PACING AMPLITUDE: 2.5 V
MDC IDC STAT BRADY AP VP PERCENT: 18 %
MDC IDC STAT BRADY AP VS PERCENT: 81 %
MDC IDC STAT BRADY AS VS PERCENT: 1 %
MDC IDC STAT BRADY RV PERCENT PACED: 19 %
Pulse Gen Model: 2210
Pulse Gen Serial Number: 7280874

## 2016-02-28 MED ORDER — DENOSUMAB 60 MG/ML ~~LOC~~ SOLN
60.0000 mg | Freq: Once | SUBCUTANEOUS | Status: AC
  Administered 2016-02-28: 60 mg via SUBCUTANEOUS
  Filled 2016-02-28: qty 1

## 2016-02-28 NOTE — Discharge Instructions (Signed)
Denosumab injection  What is this medicine?  DENOSUMAB (den oh sue mab) slows bone breakdown. Prolia is used to treat osteoporosis in women after menopause and in men. Xgeva is used to prevent bone fractures and other bone problems caused by cancer bone metastases. Xgeva is also used to treat giant cell tumor of the bone.  This medicine may be used for other purposes; ask your health care provider or pharmacist if you have questions.  What should I tell my health care provider before I take this medicine?  They need to know if you have any of these conditions:  -dental disease  -eczema  -infection or history of infections  -kidney disease or on dialysis  -low blood calcium or vitamin D  -malabsorption syndrome  -scheduled to have surgery or tooth extraction  -taking medicine that contains denosumab  -thyroid or parathyroid disease  -an unusual reaction to denosumab, other medicines, foods, dyes, or preservatives  -pregnant or trying to get pregnant  -breast-feeding  How should I use this medicine?  This medicine is for injection under the skin. It is given by a health care professional in a hospital or clinic setting.  If you are getting Prolia, a special MedGuide will be given to you by the pharmacist with each prescription and refill. Be sure to read this information carefully each time.  For Prolia, talk to your pediatrician regarding the use of this medicine in children. Special care may be needed. For Xgeva, talk to your pediatrician regarding the use of this medicine in children. While this drug may be prescribed for children as young as 13 years for selected conditions, precautions do apply.  Overdosage: If you think you have taken too much of this medicine contact a poison control center or emergency room at once.  NOTE: This medicine is only for you. Do not share this medicine with others.  What if I miss a dose?  It is important not to miss your dose. Call your doctor or health care professional if you are  unable to keep an appointment.  What may interact with this medicine?  Do not take this medicine with any of the following medications:  -other medicines containing denosumab  This medicine may also interact with the following medications:  -medicines that suppress the immune system  -medicines that treat cancer  -steroid medicines like prednisone or cortisone  This list may not describe all possible interactions. Give your health care provider a list of all the medicines, herbs, non-prescription drugs, or dietary supplements you use. Also tell them if you smoke, drink alcohol, or use illegal drugs. Some items may interact with your medicine.  What should I watch for while using this medicine?  Visit your doctor or health care professional for regular checks on your progress. Your doctor or health care professional may order blood tests and other tests to see how you are doing.  Call your doctor or health care professional if you get a cold or other infection while receiving this medicine. Do not treat yourself. This medicine may decrease your body's ability to fight infection.  You should make sure you get enough calcium and vitamin D while you are taking this medicine, unless your doctor tells you not to. Discuss the foods you eat and the vitamins you take with your health care professional.  See your dentist regularly. Brush and floss your teeth as directed. Before you have any dental work done, tell your dentist you are receiving this medicine.  Do   not become pregnant while taking this medicine or for 5 months after stopping it. Women should inform their doctor if they wish to become pregnant or think they might be pregnant. There is a potential for serious side effects to an unborn child. Talk to your health care professional or pharmacist for more information.  What side effects may I notice from receiving this medicine?  Side effects that you should report to your doctor or health care professional as soon as  possible:  -allergic reactions like skin rash, itching or hives, swelling of the face, lips, or tongue  -breathing problems  -chest pain  -fast, irregular heartbeat  -feeling faint or lightheaded, falls  -fever, chills, or any other sign of infection  -muscle spasms, tightening, or twitches  -numbness or tingling  -skin blisters or bumps, or is dry, peels, or red  -slow healing or unexplained pain in the mouth or jaw  -unusual bleeding or bruising  Side effects that usually do not require medical attention (Report these to your doctor or health care professional if they continue or are bothersome.):  -muscle pain  -stomach upset, gas  This list may not describe all possible side effects. Call your doctor for medical advice about side effects. You may report side effects to FDA at 1-800-FDA-1088.  Where should I keep my medicine?  This medicine is only given in a clinic, doctor's office, or other health care setting and will not be stored at home.  NOTE: This sheet is a summary. It may not cover all possible information. If you have questions about this medicine, talk to your doctor, pharmacist, or health care provider.      2016, Elsevier/Gold Standard. (2012-03-04 12:37:47)

## 2016-03-01 ENCOUNTER — Encounter (HOSPITAL_COMMUNITY): Payer: Self-pay

## 2016-03-02 ENCOUNTER — Encounter: Payer: Self-pay | Admitting: Cardiology

## 2016-03-23 DIAGNOSIS — M25511 Pain in right shoulder: Secondary | ICD-10-CM | POA: Diagnosis not present

## 2016-04-12 ENCOUNTER — Ambulatory Visit (INDEPENDENT_AMBULATORY_CARE_PROVIDER_SITE_OTHER): Payer: Medicare Other | Admitting: Podiatry

## 2016-04-12 ENCOUNTER — Encounter: Payer: Self-pay | Admitting: Podiatry

## 2016-04-12 DIAGNOSIS — B351 Tinea unguium: Secondary | ICD-10-CM

## 2016-04-12 DIAGNOSIS — M79676 Pain in unspecified toe(s): Secondary | ICD-10-CM

## 2016-04-13 NOTE — Progress Notes (Signed)
Patient ID: [name], female   DOB: 09/07/1925, 82 y.o.   MRN: [id number]  Subjective: This patient presents again today for scheduled visit complaining of elongated and thickened toenails which are uncomfortable when walking and wearing shoes and request toenail debridement  Objective: Orientated 3 Patient walks slowly with walker assistance No open skin lesions bilaterally The toenails are elongated, discolored, hypertrophic and tender to direct palpation 6-10  Assessment: Symptomatic onychomycoses 6-10  Plan: Debridement toenails 6-10 mechanically and electronically without any bleeding  Reappoint 3 month

## 2016-04-21 ENCOUNTER — Other Ambulatory Visit: Payer: Self-pay | Admitting: Cardiology

## 2016-04-21 NOTE — Telephone Encounter (Signed)
Rx(s) sent to pharmacy electronically.  

## 2016-04-27 DIAGNOSIS — H401123 Primary open-angle glaucoma, left eye, severe stage: Secondary | ICD-10-CM | POA: Diagnosis not present

## 2016-04-27 DIAGNOSIS — H2589 Other age-related cataract: Secondary | ICD-10-CM | POA: Diagnosis not present

## 2016-04-27 DIAGNOSIS — Z961 Presence of intraocular lens: Secondary | ICD-10-CM | POA: Diagnosis not present

## 2016-04-27 DIAGNOSIS — H401112 Primary open-angle glaucoma, right eye, moderate stage: Secondary | ICD-10-CM | POA: Diagnosis not present

## 2016-05-08 ENCOUNTER — Ambulatory Visit (INDEPENDENT_AMBULATORY_CARE_PROVIDER_SITE_OTHER): Payer: Medicare Other | Admitting: *Deleted

## 2016-05-08 DIAGNOSIS — I495 Sick sinus syndrome: Secondary | ICD-10-CM | POA: Diagnosis not present

## 2016-05-08 NOTE — Progress Notes (Signed)
Remote pacemaker transmission.   

## 2016-05-10 ENCOUNTER — Encounter: Payer: Self-pay | Admitting: Cardiology

## 2016-05-10 DIAGNOSIS — I5189 Other ill-defined heart diseases: Secondary | ICD-10-CM | POA: Diagnosis not present

## 2016-05-10 DIAGNOSIS — Z7901 Long term (current) use of anticoagulants: Secondary | ICD-10-CM | POA: Diagnosis not present

## 2016-05-10 DIAGNOSIS — Z23 Encounter for immunization: Secondary | ICD-10-CM | POA: Diagnosis not present

## 2016-05-10 DIAGNOSIS — M810 Age-related osteoporosis without current pathological fracture: Secondary | ICD-10-CM | POA: Diagnosis not present

## 2016-05-10 DIAGNOSIS — I480 Paroxysmal atrial fibrillation: Secondary | ICD-10-CM | POA: Diagnosis not present

## 2016-05-10 DIAGNOSIS — I10 Essential (primary) hypertension: Secondary | ICD-10-CM | POA: Diagnosis not present

## 2016-05-10 DIAGNOSIS — R269 Unspecified abnormalities of gait and mobility: Secondary | ICD-10-CM | POA: Diagnosis not present

## 2016-05-10 DIAGNOSIS — Z1389 Encounter for screening for other disorder: Secondary | ICD-10-CM | POA: Diagnosis not present

## 2016-05-10 DIAGNOSIS — Z950 Presence of cardiac pacemaker: Secondary | ICD-10-CM | POA: Diagnosis not present

## 2016-05-10 DIAGNOSIS — F0280 Dementia in other diseases classified elsewhere without behavioral disturbance: Secondary | ICD-10-CM | POA: Diagnosis not present

## 2016-05-10 DIAGNOSIS — Z6823 Body mass index (BMI) 23.0-23.9, adult: Secondary | ICD-10-CM | POA: Diagnosis not present

## 2016-05-18 LAB — CUP PACEART REMOTE DEVICE CHECK
Battery Remaining Longevity: 71 mo
Battery Remaining Percentage: 73 %
Battery Voltage: 2.92 V
Brady Statistic RA Percent Paced: 94 %
Brady Statistic RV Percent Paced: 39 %
Date Time Interrogation Session: 20170821064710
Implantable Lead Implant Date: 20121016
Implantable Lead Location: 753859
Implantable Lead Model: 1948
Lead Channel Impedance Value: 410 Ohm
Lead Channel Impedance Value: 640 Ohm
Lead Channel Pacing Threshold Amplitude: 1 V
Lead Channel Pacing Threshold Pulse Width: 0.8 ms
Lead Channel Sensing Intrinsic Amplitude: 1.2 mV
Lead Channel Setting Sensing Sensitivity: 2 mV
MDC IDC LEAD IMPLANT DT: 20121016
MDC IDC LEAD LOCATION: 753860
MDC IDC MSMT LEADCHNL RV PACING THRESHOLD AMPLITUDE: 0.75 V
MDC IDC MSMT LEADCHNL RV PACING THRESHOLD PULSEWIDTH: 0.5 ms
MDC IDC MSMT LEADCHNL RV SENSING INTR AMPL: 12 mV
MDC IDC SET LEADCHNL RA PACING AMPLITUDE: 2 V
MDC IDC SET LEADCHNL RV PACING AMPLITUDE: 2.5 V
MDC IDC SET LEADCHNL RV PACING PULSEWIDTH: 0.5 ms
MDC IDC STAT BRADY AP VP PERCENT: 39 %
MDC IDC STAT BRADY AP VS PERCENT: 60 %
MDC IDC STAT BRADY AS VP PERCENT: 1 %
MDC IDC STAT BRADY AS VS PERCENT: 1 %
Pulse Gen Model: 2210
Pulse Gen Serial Number: 7280874

## 2016-06-02 ENCOUNTER — Other Ambulatory Visit: Payer: Self-pay | Admitting: Internal Medicine

## 2016-06-02 DIAGNOSIS — Z1231 Encounter for screening mammogram for malignant neoplasm of breast: Secondary | ICD-10-CM

## 2016-06-15 ENCOUNTER — Ambulatory Visit
Admission: RE | Admit: 2016-06-15 | Discharge: 2016-06-15 | Disposition: A | Discharge status: Home / Self Care | Payer: TRICARE For Life (TFL) | Source: Ambulatory Visit | Attending: Internal Medicine | Admitting: Internal Medicine

## 2016-06-15 DIAGNOSIS — Z1231 Encounter for screening mammogram for malignant neoplasm of breast: Secondary | ICD-10-CM

## 2016-07-04 DIAGNOSIS — M25511 Pain in right shoulder: Secondary | ICD-10-CM | POA: Diagnosis not present

## 2016-07-12 ENCOUNTER — Ambulatory Visit (INDEPENDENT_AMBULATORY_CARE_PROVIDER_SITE_OTHER): Payer: Medicare Other | Admitting: Podiatry

## 2016-07-12 ENCOUNTER — Encounter: Payer: Self-pay | Admitting: Podiatry

## 2016-07-12 VITALS — BP 172/98 | HR 62 | Resp 18

## 2016-07-12 DIAGNOSIS — B351 Tinea unguium: Secondary | ICD-10-CM | POA: Diagnosis not present

## 2016-07-12 DIAGNOSIS — M79676 Pain in unspecified toe(s): Secondary | ICD-10-CM

## 2016-07-12 NOTE — Progress Notes (Signed)
Patient ID: [name], female   DOB: 01/20/1925, 82 y.o.   MRN: [id number]    Subjective: This patient presents again today for scheduled visit complaining of elongated and thickened toenails which are uncomfortable when walking and wearing shoes and request toenail debridement  Objective: Orientated 3 DP pulses 2/4 bilaterally PT pulses 1/4 bilaterally Capillary reflex immediate bilaterally Atrophic skin bilaterally Hammertoe 2-5 bilaterally Patient walks slowly with walker assistance No open skin lesions bilaterally The toenails are elongated, discolored, hypertrophic and tender to direct palpation 6-10  Assessment: Symptomatic onychomycoses 6-10  Plan: Debridement toenails 6-10 mechanically and electronically without any bleeding  Reappoint 3 month

## 2016-08-07 ENCOUNTER — Ambulatory Visit (INDEPENDENT_AMBULATORY_CARE_PROVIDER_SITE_OTHER): Payer: Medicare Other | Admitting: *Deleted

## 2016-08-07 DIAGNOSIS — I495 Sick sinus syndrome: Secondary | ICD-10-CM

## 2016-08-07 NOTE — Progress Notes (Signed)
Remote pacemaker transmission.   

## 2016-08-09 ENCOUNTER — Encounter: Payer: Self-pay | Admitting: Cardiology

## 2016-08-30 LAB — CUP PACEART REMOTE DEVICE CHECK
Battery Remaining Longevity: 71 mo
Battery Remaining Percentage: 73 %
Battery Voltage: 2.92 V
Brady Statistic AP VP Percent: 35 %
Brady Statistic RA Percent Paced: 92 %
Date Time Interrogation Session: 20171120071643
Implantable Lead Location: 753859
Implantable Lead Model: 1948
Implantable Pulse Generator Implant Date: 20121016
Lead Channel Impedance Value: 410 Ohm
Lead Channel Pacing Threshold Amplitude: 1 V
Lead Channel Pacing Threshold Pulse Width: 0.8 ms
Lead Channel Sensing Intrinsic Amplitude: 0.7 mV
Lead Channel Sensing Intrinsic Amplitude: 12 mV
Lead Channel Setting Pacing Amplitude: 2 V
Lead Channel Setting Pacing Pulse Width: 0.5 ms
MDC IDC LEAD IMPLANT DT: 20121016
MDC IDC LEAD IMPLANT DT: 20121016
MDC IDC LEAD LOCATION: 753860
MDC IDC MSMT LEADCHNL RV IMPEDANCE VALUE: 640 Ohm
MDC IDC MSMT LEADCHNL RV PACING THRESHOLD AMPLITUDE: 0.75 V
MDC IDC MSMT LEADCHNL RV PACING THRESHOLD PULSEWIDTH: 0.5 ms
MDC IDC SET LEADCHNL RV PACING AMPLITUDE: 2.5 V
MDC IDC SET LEADCHNL RV SENSING SENSITIVITY: 2 mV
MDC IDC STAT BRADY AP VS PERCENT: 64 %
MDC IDC STAT BRADY AS VP PERCENT: 1 %
MDC IDC STAT BRADY AS VS PERCENT: 1 %
MDC IDC STAT BRADY RV PERCENT PACED: 36 %
Pulse Gen Model: 2210
Pulse Gen Serial Number: 7280874

## 2016-08-31 DIAGNOSIS — G8929 Other chronic pain: Secondary | ICD-10-CM | POA: Diagnosis not present

## 2016-08-31 DIAGNOSIS — M25511 Pain in right shoulder: Secondary | ICD-10-CM | POA: Diagnosis not present

## 2016-09-17 ENCOUNTER — Other Ambulatory Visit: Payer: Self-pay | Admitting: Cardiology

## 2016-09-19 NOTE — Telephone Encounter (Signed)
Rx has been sent to the pharmacy electronically. ° °

## 2016-10-11 ENCOUNTER — Encounter: Payer: Self-pay | Admitting: Podiatry

## 2016-10-11 ENCOUNTER — Ambulatory Visit (INDEPENDENT_AMBULATORY_CARE_PROVIDER_SITE_OTHER): Payer: Medicare Other | Admitting: Podiatry

## 2016-10-11 VITALS — BP 160/92 | HR 60 | Resp 18

## 2016-10-11 DIAGNOSIS — M79676 Pain in unspecified toe(s): Secondary | ICD-10-CM | POA: Diagnosis not present

## 2016-10-11 DIAGNOSIS — B351 Tinea unguium: Secondary | ICD-10-CM

## 2016-10-11 NOTE — Progress Notes (Signed)
Patient ID: [name], female   DOB: 03-12-1925, 83 y.o.   MRN: [id number]    Subjective: This patient presents again today for scheduled visit complaining of elongated and thickened toenails which are uncomfortable when walking and wearing shoes and request toenail debridement  Objective: Orientated 3 DP pulses 2/4 bilaterally PT pulses 1/4 bilaterally Capillary reflex immediate bilaterally Sensation to 10 g monofilament wire intact 5/5 right 4/5 left Vibratory sensation reactive bilaterally Ankle reflexes reactive bilaterally Atrophic skin bilaterally Hammertoe 2-5 bilaterally Patient walks slowly with walker assistance No open skin lesions bilaterally The toenails are elongated, discolored, hypertrophic and tender to direct palpation 6-10  Assessment: Symptomatic onychomycoses 6-10  Plan: Debridement toenails 6-10 mechanically and electronically without any bleeding  Reappoint 3 month

## 2016-10-13 DIAGNOSIS — J849 Interstitial pulmonary disease, unspecified: Secondary | ICD-10-CM | POA: Diagnosis not present

## 2016-10-13 DIAGNOSIS — I5189 Other ill-defined heart diseases: Secondary | ICD-10-CM | POA: Diagnosis not present

## 2016-10-13 DIAGNOSIS — I480 Paroxysmal atrial fibrillation: Secondary | ICD-10-CM | POA: Diagnosis not present

## 2016-10-31 DIAGNOSIS — M25511 Pain in right shoulder: Secondary | ICD-10-CM | POA: Diagnosis not present

## 2016-10-31 DIAGNOSIS — G8929 Other chronic pain: Secondary | ICD-10-CM | POA: Diagnosis not present

## 2016-10-31 IMAGING — CT CT HEAD W/O CM
2 series · 16 of 30 positions shown, 18 images · non-contrast
Comparison: None.

CLINICAL DATA: Recent fall with frontal hematoma

EXAM:
CT HEAD WITHOUT CONTRAST
TECHNIQUE: Contiguous axial images were obtained from the base of the skull
through the vertex without intravenous contrast.

[Series 201: head w/o, idose (1) · axial · non-contrast · 0.44mm/px · z∈[+96,+206]mm · 8 of 30 slices shown, 10 images]
[im 4/30  brain]
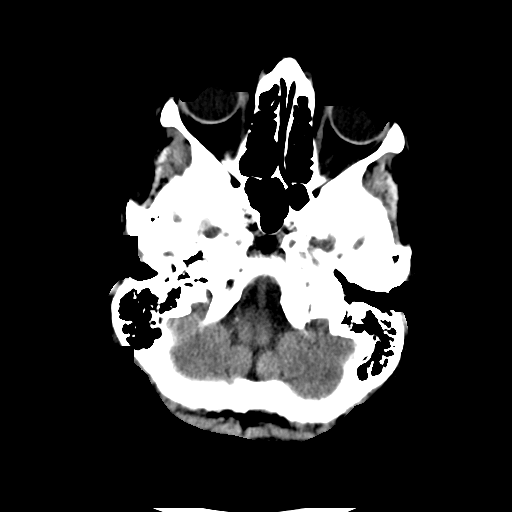
[im 4/30  bone]
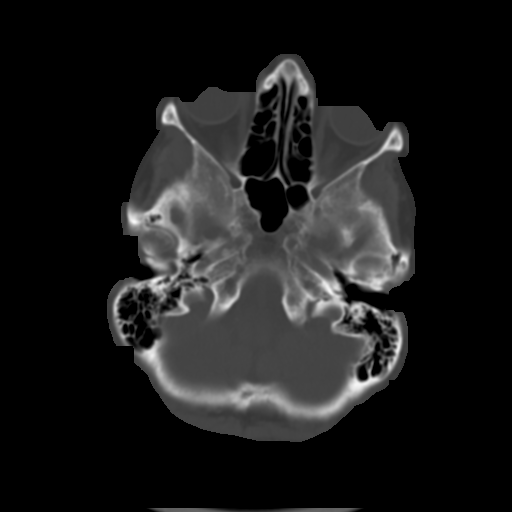
[im 7/30  brain]
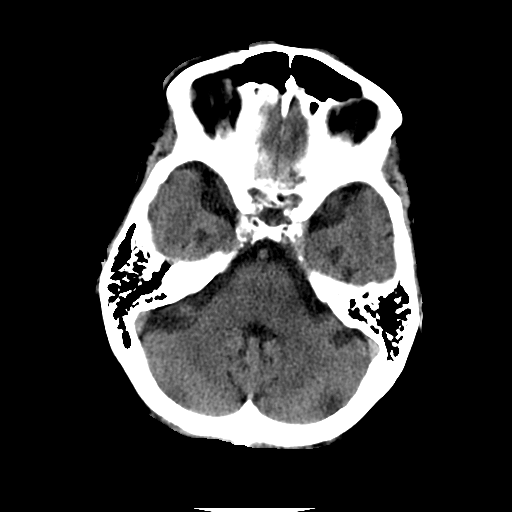
[im 10/30  brain]
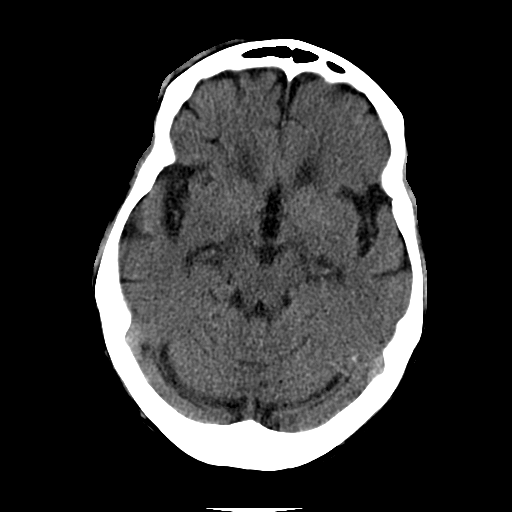
[im 13/30  brain]
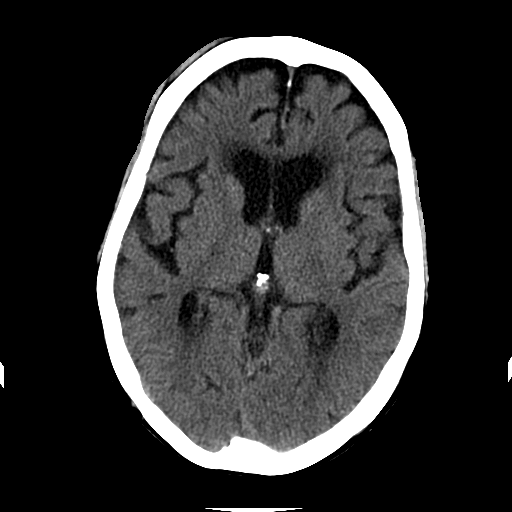
[im 17/30  brain]
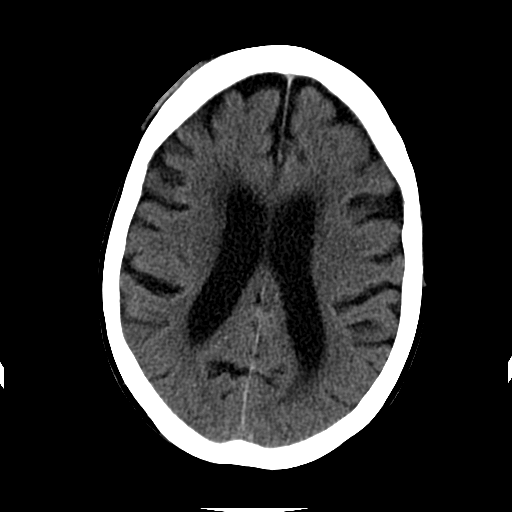
[im 17/30  bone]
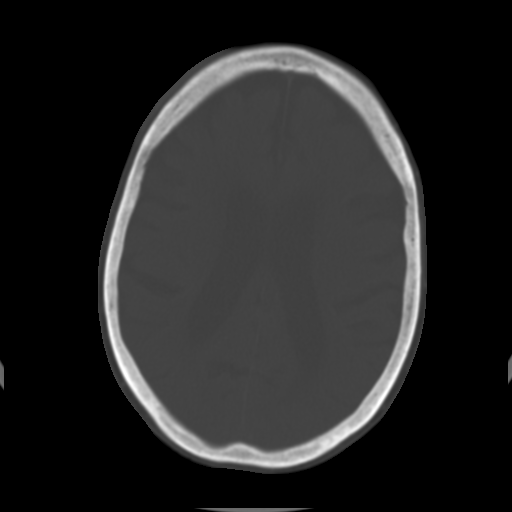
[im 20/30  brain]
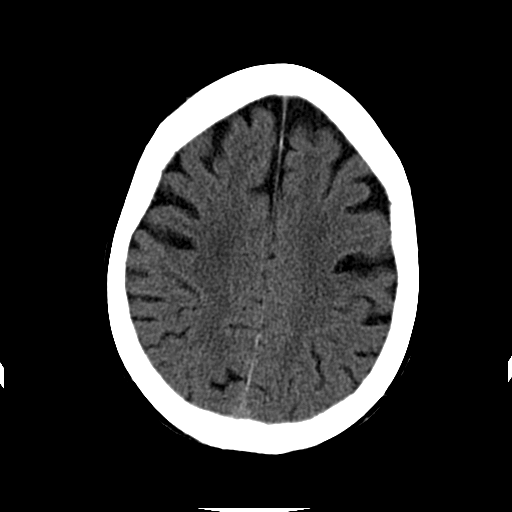
[im 23/30  brain]
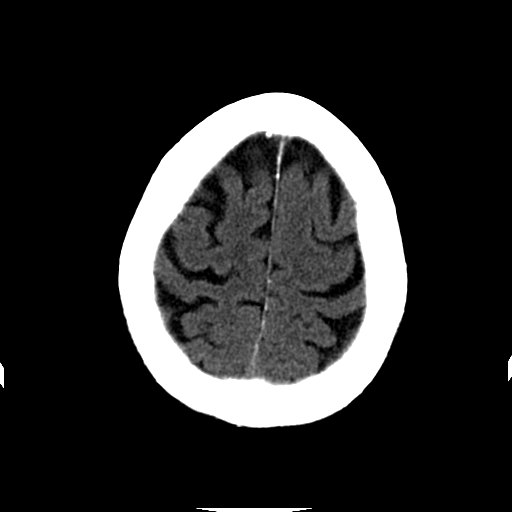
[im 26/30  brain]
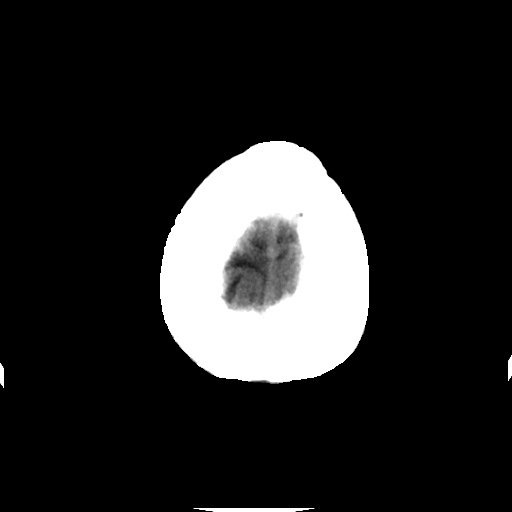

[Series 202: head w/o bone, idose (1) · axial · non-contrast · 0.44mm/px · z∈[+94,+209]mm · 8 of 60 slices shown]
[im 7/60  bone]
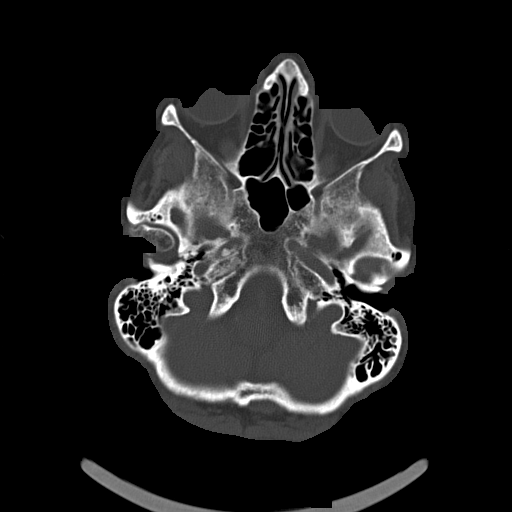
[im 13/60  bone]
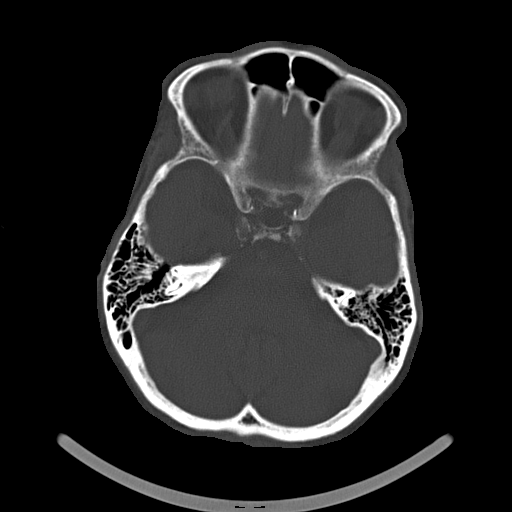
[im 19/60  bone]
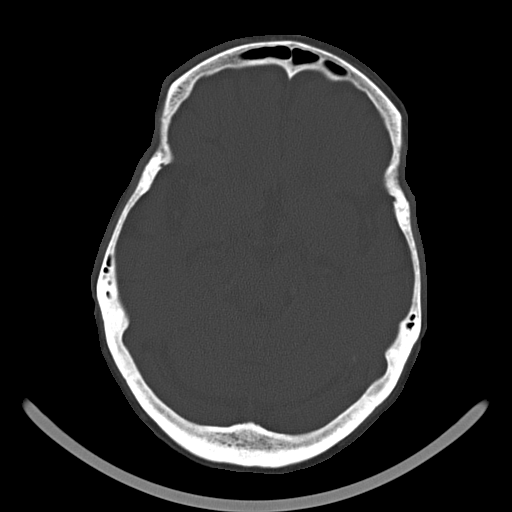
[im 25/60  bone]
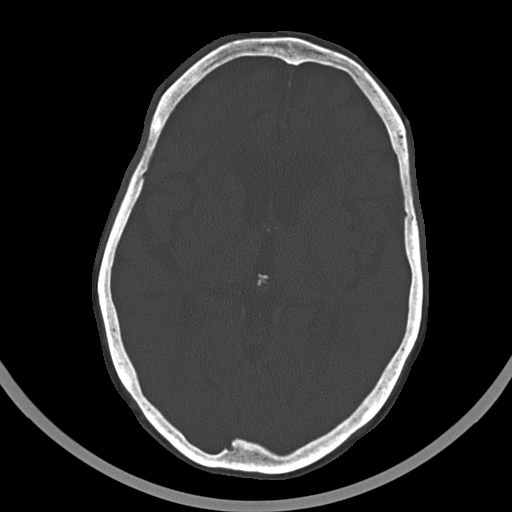
[im 35/60  bone]
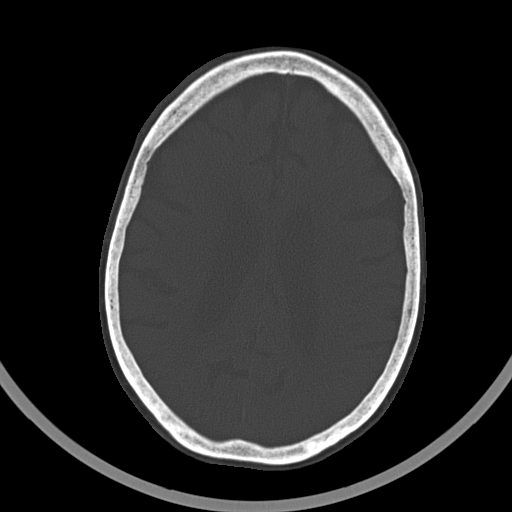
[im 41/60  bone]
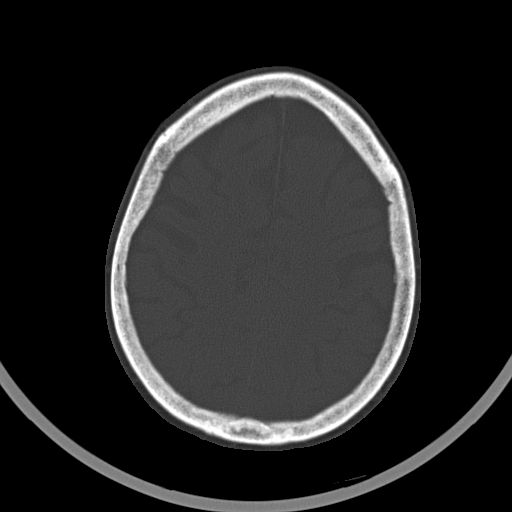
[im 47/60  bone]
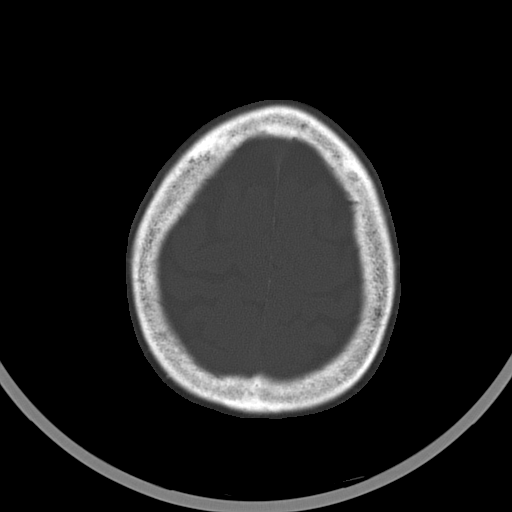
[im 53/60  bone]
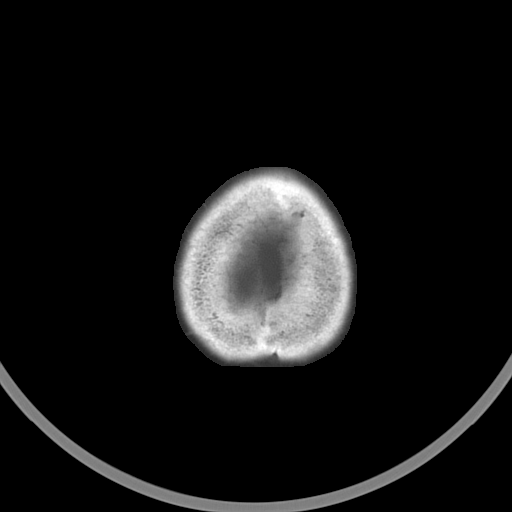

[16 of 30 positions shown; findings below may reference images not displayed]

FINDINGS: The bony calvarium is intact. Right frontal scalp hematoma is noted
consistent with the patient's recent injury. Mild atrophic changes
are seen. No findings to suggest acute hemorrhage, acute infarction
or space-occupying mass lesion are noted.
IMPRESSION: Mild atrophic changes.

Right frontal scalp hematoma consistent with a recent injury. No
acute intracranial abnormality is noted.

## 2016-11-02 DIAGNOSIS — H401433 Capsular glaucoma with pseudoexfoliation of lens, bilateral, severe stage: Secondary | ICD-10-CM | POA: Diagnosis not present

## 2016-11-02 DIAGNOSIS — Z961 Presence of intraocular lens: Secondary | ICD-10-CM | POA: Diagnosis not present

## 2016-11-08 ENCOUNTER — Ambulatory Visit (INDEPENDENT_AMBULATORY_CARE_PROVIDER_SITE_OTHER): Payer: Medicare Other | Admitting: Internal Medicine

## 2016-11-08 ENCOUNTER — Encounter: Payer: Self-pay | Admitting: Internal Medicine

## 2016-11-08 ENCOUNTER — Other Ambulatory Visit: Payer: Self-pay

## 2016-11-08 VITALS — BP 138/60 | Ht 63.0 in | Wt 128.0 lb

## 2016-11-08 DIAGNOSIS — I480 Paroxysmal atrial fibrillation: Secondary | ICD-10-CM

## 2016-11-08 DIAGNOSIS — I495 Sick sinus syndrome: Secondary | ICD-10-CM | POA: Diagnosis not present

## 2016-11-08 DIAGNOSIS — I10 Essential (primary) hypertension: Secondary | ICD-10-CM | POA: Diagnosis not present

## 2016-11-08 NOTE — Patient Instructions (Addendum)
Medication Instructions:  Your physician recommends that you continue on your current medications as directed. Please refer to the Current Medication list given to you today.   Labwork: None ordered   Testing/Procedures: None ordered   Follow-Up: Your physician wants you to follow-up in: 12 months with Dr Rayann Heman Dennis Bast will receive a reminder letter in the mail two months in advance. If you don't receive a letter, please call our office to schedule the follow-up appointment.  Remote monitoring is used to monitor your Pacemaker from home. This monitoring reduces the number of office visits required to check your device to one time per year. It allows [place] to keep an eye on the functioning of your device to ensure it is working properly. You are scheduled for a device check from home on 02/07/17. You may send your transmission at any time that day. If you have a wireless device, the transmission will be sent automatically. After your physician reviews your transmission, you will receive a postcard with your next transmission date.     Any Other Special Instructions Will Be Listed Below (If Applicable).     If you need a refill on your cardiac medications before your next appointment, please call your pharmacy.

## 2016-11-08 NOTE — Progress Notes (Signed)
Electrophysiology Office Note   Date:  11/08/2016   ID:  [name], DOB 10-31-1941, MRN [id number]  PCP:  Jacqueline Redwood, MD  Cardiologist:  Dr Martinique Primary Electrophysiologist: Thompson Grayer, MD    Chief Complaint  Patient presents with  . Atrial Fibrillation     History of Present Illness: [name] is a 83 y.o. female who presents today for electrophysiology evaluation.   Her primary concern is with fatigue and SOB.  This is chronic.  She is not very active.  She is doing reasonably well for her age. Today, she denies symptoms of palpitations, chest pain, shortness of breath, orthopnea, PND, lower extremity edema, claudication, dizziness, presyncope, syncope, bleeding, or neurologic sequela. The patient is tolerating medications without difficulties and is otherwise without complaint today.    Past Medical History:  Diagnosis Date  . (HFpEF) heart failure with preserved ejection fraction ([place])   . Arthritis   . Atrial fibrillation [facility])    Prior TEE/CV in May 2012; managed with rate control and anticoagulation  . Back pain    CHRONIC  . Dysphagia   . GERD (gastroesophageal reflux disease)   . Hiatal hernia   . Hypertension   . Hyponatremia    improved  . Osteopenia   . Pacemaker   . Pneumonia 04/2010  . Renal artery stenosis ([place]) 2008   STATUS POST ANGIOPLASTY  . Seizure disorder ([place])    on anti-epileptic therapy  . SIADH (syndrome of inappropriate ADH production) ([place])    h/o HYPONATREMIA ; LIKELY RELATED TO HER LUNG PROCESS  . Tachy-brady syndrome [facility])    with pauses noted in May 2012; s/p PTVP October 2012  . Urinary incontinence    Past Surgical History:  Procedure Laterality Date  . APPENDECTOMY    . BACK SURGERY     X2  . CARDIAC CATHETERIZATION  2007   Normal  . CARDIOVERSION    . CARDIOVERSION  10/14/2012   Procedure: CARDIOVERSION;  Surgeon: Jacqueline Headings, MD;  Location: [facility] ENDOSCOPY;  Service: Cardiovascular;  Laterality: N/A;  .  CARDIOVERSION N/A 05/30/2013   Procedure: CARDIOVERSION;  Surgeon: Thompson Grayer, MD;  Location: [place];  Service: Cardiovascular;  Laterality: N/A;  . CARDIOVERSION N/A 03/03/2013   Procedure: CARDIOVERSION;  Surgeon: Deboraha Sprang, MD;  Location: [facility] CATH LAB;  Service: Cardiovascular;  Laterality: N/A;  . CATARACT EXTRACTION    . CATARACT EXTRACTION Bilateral   . CHOLECYSTECTOMY    . KNEE ARTHROSCOPY Left   . PACEMAKER INSERTION  06/2011   SJM Accent DR RF implanted by Dr [name]  . PARTIAL HYSTERECTOMY    . RENAL ANGIOPLASTY Right 01/2007  . SHOULDER SURGERY    . TONSILLECTOMY    . TOTAL KNEE ARTHROPLASTY Bilateral      Current Outpatient Prescriptions  Medication Sig Dispense Refill  . Calcium Citrate (CITRACAL PO) Take 1 tablet by mouth 2 (two) times daily.     . Cholecalciferol (VITAMIN D) 400 UNITS capsule Take 400 Units by mouth 3 (three) times daily.    . dabigatran (PRADAXA) 75 MG CAPS capsule Take 1 capsule (75 mg total) by mouth every 12 (twelve) hours. 180 capsule 3  . divalproex (DEPAKOTE) 500 MG EC tablet Take 500 mg by mouth at bedtime.      . dofetilide (TIKOSYN) 250 MCG capsule Take 250 mcg by mouth 2 (two) times daily.    . dorzolamide-timolol (COSOPT) 22.3-6.8 MG/ML ophthalmic solution Put one drop in right eye twice  a day (use as directed)  4  . furosemide (LASIX) 40 MG tablet Take 40 mg by mouth daily.    Marland Kitchen HYDROcodone-acetaminophen (VICODIN) 5-500 MG per tablet Take 1 tablet by mouth every 6 (six) hours as needed. For pain     . LATANOPROST OP Place 1 drop into the right eye at bedtime.    . metoprolol tartrate (LOPRESSOR) 25 MG tablet Take 25 mg by mouth 2 (two) times daily.    . mirabegron ER (MYRBETRIQ) 50 MG TB24 tablet Take 50 mg by mouth daily.    . Multiple Vitamins-Minerals (MULTIVITAMINS THER. W/MINERALS) TABS Take 1 tablet by mouth daily.      Marland Kitchen olmesartan (BENICAR) 40 MG tablet Take 40 mg by mouth daily.    . pantoprazole (PROTONIX) 40 MG tablet Take 1  tablet by mouth daily.    . polyethylene glycol powder (GLYCOLAX/MIRALAX) powder Take 17 g by mouth daily as needed. For constipation    . potassium chloride (KLOR-CON M10) 10 MEQ tablet Take 10 mEq by mouth 2 (two) times daily.    . prednisoLONE acetate (PRED FORTE) 1 % ophthalmic suspension Place 1 drop into the left eye 2 (two) times daily.    Marland Kitchen tolterodine (DETROL LA) 4 MG 24 hr capsule Take 4 mg by mouth every evening.      No current facility-administered medications for this visit.     Allergies:   Amiodarone   Social History:  The patient  reports that she has never smoked. She has never used smokeless tobacco. She reports that she does not drink alcohol or use drugs.   Family History:  The patient's family history includes Bladder Cancer in her brother; Diabetes in her father and mother; Heart disease in her mother; Hypertension in her daughter and son; Kidney failure in her brother; Lung disease in her brother and sister; Prostate cancer in her father; Stroke in her father and mother.    ROS:  Please see the history of present illness.   All other systems are reviewed and negative.    PHYSICAL EXAM: VS:  BP 138/60   Ht 5\' 3"  (1.6 m)   Wt 128 lb (58.1 kg)   BMI 22.67 kg/m  , BMI Body mass index is 22.67 kg/m. GEN: thin and frail appearing in no acute distress, in a wheelchair today HEENT: normal  Neck: no JVD, carotid bruits, or masses Cardiac: RRR; no murmurs, rubs, or gallops,no edema  Respiratory:  clear to auscultation bilaterally, normal work of breathing GI: soft, nontender, nondistended, + BS MS: no deformity or atrophy  Skin: warm and dry, device pocket is well healed Neuro:  Strength and sensation are intact Psych: euthymic mood, full affect  EKG:  EKG is ordered today. The ekg ordered today shows A pacing at 60 bpm, PR 200 msec, qtc 432 msec   Device interrogation is reviewed today in detail.  See PaceArt for details.  Labs 10/2015 reviewed  Wt Readings  from Last 3 Encounters:  11/08/16 128 lb (58.1 kg)  12/27/15 125 lb 9.6 oz (57 kg)  11/05/15 128 lb 9.6 oz (58.3 kg)      ASSESSMENT AND PLAN:  1.  Persistent afib Doing very well with [place] reviewed in epic She wishes to have labs deferred until she sees Dr Brigitte Pulse at the end of this week chads2vasc score is at least 5.  She is doing well with low dose pradaxa Though CrCl is 49, I am reluctant to increase pradaxa given  her fragility  2. HTN Stable No change required today bmet today  3. Tachy/brady Normal pacemaker function See Pace Art report No changes today  Current medicines are reviewed at length with the patient today.   The patient does not have concerns regarding her medicines.  The following changes were made today:  none  Follow-up: merlin Return to see me in 1 year Follow-up with Dr Martinique as scheduled PCP to follow CBC, BMET, Mg at least twice per year  Signed, Thompson Grayer, MD  11/08/2016 2:35 PM     [facility] [address] [place] [address] [phone] (office) [phone] (fax)

## 2016-11-10 DIAGNOSIS — Z6822 Body mass index (BMI) 22.0-22.9, adult: Secondary | ICD-10-CM | POA: Diagnosis not present

## 2016-11-10 DIAGNOSIS — Z7901 Long term (current) use of anticoagulants: Secondary | ICD-10-CM | POA: Diagnosis not present

## 2016-11-10 DIAGNOSIS — Z950 Presence of cardiac pacemaker: Secondary | ICD-10-CM | POA: Diagnosis not present

## 2016-11-10 DIAGNOSIS — R251 Tremor, unspecified: Secondary | ICD-10-CM | POA: Diagnosis not present

## 2016-11-10 DIAGNOSIS — I480 Paroxysmal atrial fibrillation: Secondary | ICD-10-CM | POA: Diagnosis not present

## 2016-11-10 DIAGNOSIS — I10 Essential (primary) hypertension: Secondary | ICD-10-CM | POA: Diagnosis not present

## 2016-11-10 DIAGNOSIS — F0280 Dementia in other diseases classified elsewhere without behavioral disturbance: Secondary | ICD-10-CM | POA: Diagnosis not present

## 2016-11-10 DIAGNOSIS — M810 Age-related osteoporosis without current pathological fracture: Secondary | ICD-10-CM | POA: Diagnosis not present

## 2016-11-10 DIAGNOSIS — R569 Unspecified convulsions: Secondary | ICD-10-CM | POA: Diagnosis not present

## 2016-11-10 LAB — CUP PACEART INCLINIC DEVICE CHECK
Battery Voltage: 2.9 V
Date Time Interrogation Session: 20180221192518
Implantable Lead Location: 753860
Implantable Lead Model: 1948
Implantable Pulse Generator Implant Date: 20121016
Lead Channel Pacing Threshold Amplitude: 0.5 V
Lead Channel Pacing Threshold Amplitude: 1 V
Lead Channel Pacing Threshold Pulse Width: 0.5 ms
Lead Channel Setting Pacing Amplitude: 2 V
Lead Channel Setting Pacing Amplitude: 2.5 V
Lead Channel Setting Pacing Pulse Width: 0.5 ms
MDC IDC LEAD IMPLANT DT: 20121016
MDC IDC LEAD IMPLANT DT: 20121016
MDC IDC LEAD LOCATION: 753859
MDC IDC MSMT LEADCHNL RA IMPEDANCE VALUE: 412.5 Ohm
MDC IDC MSMT LEADCHNL RA PACING THRESHOLD PULSEWIDTH: 0.8 ms
MDC IDC MSMT LEADCHNL RA SENSING INTR AMPL: 0.9 mV
MDC IDC MSMT LEADCHNL RV IMPEDANCE VALUE: 650 Ohm
MDC IDC MSMT LEADCHNL RV SENSING INTR AMPL: 12 mV
MDC IDC PG SERIAL: 7280874
MDC IDC SET LEADCHNL RV SENSING SENSITIVITY: 2 mV
MDC IDC STAT BRADY RA PERCENT PACED: 89 %
MDC IDC STAT BRADY RV PERCENT PACED: 32 %
Pulse Gen Model: 2210

## 2016-11-13 ENCOUNTER — Other Ambulatory Visit: Payer: Self-pay | Admitting: Internal Medicine

## 2016-11-13 NOTE — Telephone Encounter (Signed)
Age 83 Wt 58.1kg (11/08/2016) Saw Dr Rayann Heman  On 11/08/2016 Hgb 14.8 HCT 41.2 (11/13/2016) SrCr 0.7 (11/13/2016)  CrCl 49.97 Refill done on Pradaxia 75mg  q 12 hours

## 2016-11-17 ENCOUNTER — Other Ambulatory Visit (HOSPITAL_COMMUNITY): Payer: Self-pay | Admitting: *Deleted

## 2016-11-20 ENCOUNTER — Ambulatory Visit (HOSPITAL_COMMUNITY)
Admission: RE | Admit: 2016-11-20 | Discharge: 2016-11-20 | Disposition: A | Discharge status: Home / Self Care | Payer: Medicare Other | Source: Ambulatory Visit | Attending: Internal Medicine | Admitting: Internal Medicine

## 2016-11-20 DIAGNOSIS — M810 Age-related osteoporosis without current pathological fracture: Secondary | ICD-10-CM | POA: Insufficient documentation

## 2016-11-20 MED ORDER — DENOSUMAB 60 MG/ML ~~LOC~~ SOLN
60.0000 mg | Freq: Once | SUBCUTANEOUS | Status: AC
  Administered 2016-11-20: 60 mg via SUBCUTANEOUS
  Filled 2016-11-20: qty 1

## 2016-11-26 ENCOUNTER — Other Ambulatory Visit: Payer: Self-pay | Admitting: Internal Medicine

## 2016-12-16 ENCOUNTER — Other Ambulatory Visit: Payer: Self-pay | Admitting: Internal Medicine

## 2016-12-18 DIAGNOSIS — R350 Frequency of micturition: Secondary | ICD-10-CM | POA: Diagnosis not present

## 2016-12-18 DIAGNOSIS — N3941 Urge incontinence: Secondary | ICD-10-CM | POA: Diagnosis not present

## 2016-12-26 DIAGNOSIS — G8929 Other chronic pain: Secondary | ICD-10-CM | POA: Diagnosis not present

## 2016-12-26 DIAGNOSIS — M25511 Pain in right shoulder: Secondary | ICD-10-CM | POA: Diagnosis not present

## 2016-12-27 DIAGNOSIS — D0462 Carcinoma in situ of skin of left upper limb, including shoulder: Secondary | ICD-10-CM | POA: Diagnosis not present

## 2016-12-27 DIAGNOSIS — L821 Other seborrheic keratosis: Secondary | ICD-10-CM | POA: Diagnosis not present

## 2016-12-27 DIAGNOSIS — D1801 Hemangioma of skin and subcutaneous tissue: Secondary | ICD-10-CM | POA: Diagnosis not present

## 2016-12-27 DIAGNOSIS — D485 Neoplasm of uncertain behavior of skin: Secondary | ICD-10-CM | POA: Diagnosis not present

## 2016-12-27 DIAGNOSIS — L570 Actinic keratosis: Secondary | ICD-10-CM | POA: Diagnosis not present

## 2016-12-27 DIAGNOSIS — L814 Other melanin hyperpigmentation: Secondary | ICD-10-CM | POA: Diagnosis not present

## 2016-12-27 DIAGNOSIS — Z85828 Personal history of other malignant neoplasm of skin: Secondary | ICD-10-CM | POA: Diagnosis not present

## 2017-01-10 ENCOUNTER — Ambulatory Visit (INDEPENDENT_AMBULATORY_CARE_PROVIDER_SITE_OTHER): Payer: Medicare Other | Admitting: Podiatry

## 2017-01-10 DIAGNOSIS — M79676 Pain in unspecified toe(s): Secondary | ICD-10-CM

## 2017-01-10 DIAGNOSIS — B351 Tinea unguium: Secondary | ICD-10-CM

## 2017-01-10 NOTE — Progress Notes (Signed)
Patient ID: [name], female   DOB: 1925-02-26, 83 y.o.   MRN: [id number]    Subjective: This patient presents again today for scheduled visit complaining of elongated and thickened toenails which are uncomfortable when walking and wearing shoes and request toenail debridement  Objective: Orientated 3 DP pulses 2/4 bilaterally PT pulses 1/4 bilaterally Capillary reflex immediate bilaterally Sensation to 10 g monofilament wire intact 5/5 right 4/5 left Vibratory sensation reactive bilaterally Ankle reflexes reactive bilaterally Atrophic skin bilaterally Absent hair growth bilaterally Hammertoe 2-5 bilaterally Patient walks slowly with walker assistance No open skin lesions bilaterally The toenails are elongated, discolored, hypertrophic and tender to direct palpation 6-10  Assessment: Symptomatic onychomycoses 6-10  Plan: Debridement toenails 6-10 mechanically and electronically without any bleeding  Reappoint 3 month

## 2017-01-16 ENCOUNTER — Other Ambulatory Visit: Payer: Self-pay | Admitting: Cardiology

## 2017-01-16 NOTE — Telephone Encounter (Signed)
REFILL 

## 2017-01-17 DIAGNOSIS — N3941 Urge incontinence: Secondary | ICD-10-CM | POA: Diagnosis not present

## 2017-01-17 DIAGNOSIS — R350 Frequency of micturition: Secondary | ICD-10-CM | POA: Diagnosis not present

## 2017-01-19 ENCOUNTER — Other Ambulatory Visit: Payer: Self-pay | Admitting: Cardiology

## 2017-01-19 NOTE — Telephone Encounter (Signed)
REFILL 

## 2017-01-29 DIAGNOSIS — R269 Unspecified abnormalities of gait and mobility: Secondary | ICD-10-CM | POA: Diagnosis not present

## 2017-01-29 DIAGNOSIS — R600 Localized edema: Secondary | ICD-10-CM | POA: Diagnosis not present

## 2017-01-29 DIAGNOSIS — J849 Interstitial pulmonary disease, unspecified: Secondary | ICD-10-CM | POA: Diagnosis not present

## 2017-01-29 DIAGNOSIS — R531 Weakness: Secondary | ICD-10-CM | POA: Diagnosis not present

## 2017-01-29 DIAGNOSIS — I480 Paroxysmal atrial fibrillation: Secondary | ICD-10-CM | POA: Diagnosis not present

## 2017-01-29 DIAGNOSIS — Z6823 Body mass index (BMI) 23.0-23.9, adult: Secondary | ICD-10-CM | POA: Diagnosis not present

## 2017-01-30 DIAGNOSIS — I10 Essential (primary) hypertension: Secondary | ICD-10-CM | POA: Diagnosis not present

## 2017-02-05 ENCOUNTER — Other Ambulatory Visit: Payer: Self-pay | Admitting: Internal Medicine

## 2017-02-07 ENCOUNTER — Ambulatory Visit (INDEPENDENT_AMBULATORY_CARE_PROVIDER_SITE_OTHER): Payer: Medicare Other | Admitting: *Deleted

## 2017-02-07 DIAGNOSIS — I495 Sick sinus syndrome: Secondary | ICD-10-CM | POA: Diagnosis not present

## 2017-02-07 NOTE — Progress Notes (Signed)
Remote pacemaker transmission.   

## 2017-02-08 ENCOUNTER — Encounter: Payer: Self-pay | Admitting: Cardiology

## 2017-02-08 LAB — CUP PACEART REMOTE DEVICE CHECK
Battery Remaining Percentage: 65 %
Battery Voltage: 2.9 V
Brady Statistic AP VP Percent: 29 %
Brady Statistic RA Percent Paced: 69 %
Brady Statistic RV Percent Paced: 36 %
Date Time Interrogation Session: 20180523061807
Implantable Lead Implant Date: 20121016
Implantable Lead Implant Date: 20121016
Implantable Lead Location: 753859
Implantable Lead Model: 1948
Lead Channel Pacing Threshold Amplitude: 1 V
Lead Channel Pacing Threshold Pulse Width: 0.5 ms
Lead Channel Pacing Threshold Pulse Width: 0.8 ms
Lead Channel Sensing Intrinsic Amplitude: 0.9 mV
Lead Channel Setting Pacing Amplitude: 2.5 V
Lead Channel Setting Sensing Sensitivity: 2 mV
MDC IDC LEAD LOCATION: 753860
MDC IDC MSMT BATTERY REMAINING LONGEVITY: 64 mo
MDC IDC MSMT LEADCHNL RA IMPEDANCE VALUE: 390 Ohm
MDC IDC MSMT LEADCHNL RV IMPEDANCE VALUE: 610 Ohm
MDC IDC MSMT LEADCHNL RV PACING THRESHOLD AMPLITUDE: 0.5 V
MDC IDC MSMT LEADCHNL RV SENSING INTR AMPL: 12 mV
MDC IDC PG IMPLANT DT: 20121016
MDC IDC PG SERIAL: 7280874
MDC IDC SET LEADCHNL RA PACING AMPLITUDE: 2 V
MDC IDC SET LEADCHNL RV PACING PULSEWIDTH: 0.5 ms
MDC IDC STAT BRADY AP VS PERCENT: 70 %
MDC IDC STAT BRADY AS VP PERCENT: 1 %
MDC IDC STAT BRADY AS VS PERCENT: 1 %
Pulse Gen Model: 2210

## 2017-02-27 DIAGNOSIS — M25511 Pain in right shoulder: Secondary | ICD-10-CM | POA: Diagnosis not present

## 2017-02-27 DIAGNOSIS — G8929 Other chronic pain: Secondary | ICD-10-CM | POA: Diagnosis not present

## 2017-03-22 DIAGNOSIS — R351 Nocturia: Secondary | ICD-10-CM | POA: Diagnosis not present

## 2017-03-22 DIAGNOSIS — N3941 Urge incontinence: Secondary | ICD-10-CM | POA: Diagnosis not present

## 2017-03-28 DIAGNOSIS — Z85828 Personal history of other malignant neoplasm of skin: Secondary | ICD-10-CM | POA: Diagnosis not present

## 2017-03-28 DIAGNOSIS — L570 Actinic keratosis: Secondary | ICD-10-CM | POA: Diagnosis not present

## 2017-03-28 DIAGNOSIS — L821 Other seborrheic keratosis: Secondary | ICD-10-CM | POA: Diagnosis not present

## 2017-03-28 DIAGNOSIS — L814 Other melanin hyperpigmentation: Secondary | ICD-10-CM | POA: Diagnosis not present

## 2017-03-28 DIAGNOSIS — D692 Other nonthrombocytopenic purpura: Secondary | ICD-10-CM | POA: Diagnosis not present

## 2017-03-28 DIAGNOSIS — D1801 Hemangioma of skin and subcutaneous tissue: Secondary | ICD-10-CM | POA: Diagnosis not present

## 2017-04-11 ENCOUNTER — Ambulatory Visit (INDEPENDENT_AMBULATORY_CARE_PROVIDER_SITE_OTHER): Payer: Medicare Other | Admitting: Podiatry

## 2017-04-11 ENCOUNTER — Encounter: Payer: Self-pay | Admitting: Podiatry

## 2017-04-11 DIAGNOSIS — M79676 Pain in unspecified toe(s): Secondary | ICD-10-CM | POA: Diagnosis not present

## 2017-04-11 DIAGNOSIS — B351 Tinea unguium: Secondary | ICD-10-CM

## 2017-04-11 NOTE — Patient Instructions (Signed)
With a Band-Aid in the third right toe 1-3 days and apply triple antibiotic ointment daily with a Band-Aid until a scab forms

## 2017-04-12 NOTE — Progress Notes (Signed)
Patient ID: [name], female   DOB: February 26, 1925, 83 y.o.   MRN: [id number]    Subjective: This patient presents again today for scheduled visit complaining of elongated and thickened toenails which are uncomfortable when walking and wearing shoes and request toenail debridement  Objective: Orientated 3 DP pulses 2/4 bilaterally PT pulses 1/4 bilaterally Capillary reflex immediate bilaterally Sensation to 10 g monofilament wire intact 5/5 right 4/5 left Vibratory sensation reactive bilaterally Ankle reflexes reactive bilaterally Atrophic skin bilaterally Absent hair growth bilaterally Hammertoe 2-5 bilaterally Patient walks slowly with walker assistance No open skin lesions bilaterally The toenails are elongated, discolored, hypertrophic and tender to direct palpation 6-10  Assessment: Symptomatic onychomycoses 6-10  Plan: Debridement toenails 6-10 mechanically and electronically with slight bleeding distal third right toe, treated with topical antibiotic ointment and Band-Aid. Patient instructed removed Band-Aid 1-3 days and continue to apply topical antibiotic ointment and Band-Aid daily until a scab forms  Reappoint 3 months

## 2017-05-02 DIAGNOSIS — H01021 Squamous blepharitis right upper eyelid: Secondary | ICD-10-CM | POA: Diagnosis not present

## 2017-05-02 DIAGNOSIS — H01024 Squamous blepharitis left upper eyelid: Secondary | ICD-10-CM | POA: Diagnosis not present

## 2017-05-02 DIAGNOSIS — H01022 Squamous blepharitis right lower eyelid: Secondary | ICD-10-CM | POA: Diagnosis not present

## 2017-05-02 DIAGNOSIS — Z961 Presence of intraocular lens: Secondary | ICD-10-CM | POA: Diagnosis not present

## 2017-05-02 DIAGNOSIS — H401433 Capsular glaucoma with pseudoexfoliation of lens, bilateral, severe stage: Secondary | ICD-10-CM | POA: Diagnosis not present

## 2017-05-02 DIAGNOSIS — H01025 Squamous blepharitis left lower eyelid: Secondary | ICD-10-CM | POA: Diagnosis not present

## 2017-05-04 ENCOUNTER — Other Ambulatory Visit: Payer: Self-pay | Admitting: Internal Medicine

## 2017-05-04 DIAGNOSIS — Z1231 Encounter for screening mammogram for malignant neoplasm of breast: Secondary | ICD-10-CM

## 2017-05-08 DIAGNOSIS — R5383 Other fatigue: Secondary | ICD-10-CM | POA: Diagnosis not present

## 2017-05-08 DIAGNOSIS — R569 Unspecified convulsions: Secondary | ICD-10-CM | POA: Diagnosis not present

## 2017-05-08 DIAGNOSIS — M810 Age-related osteoporosis without current pathological fracture: Secondary | ICD-10-CM | POA: Diagnosis not present

## 2017-05-08 DIAGNOSIS — H4089 Other specified glaucoma: Secondary | ICD-10-CM | POA: Diagnosis not present

## 2017-05-08 DIAGNOSIS — Z950 Presence of cardiac pacemaker: Secondary | ICD-10-CM | POA: Diagnosis not present

## 2017-05-08 DIAGNOSIS — I5189 Other ill-defined heart diseases: Secondary | ICD-10-CM | POA: Diagnosis not present

## 2017-05-08 DIAGNOSIS — R600 Localized edema: Secondary | ICD-10-CM | POA: Diagnosis not present

## 2017-05-08 DIAGNOSIS — J849 Interstitial pulmonary disease, unspecified: Secondary | ICD-10-CM | POA: Diagnosis not present

## 2017-05-08 DIAGNOSIS — Z1389 Encounter for screening for other disorder: Secondary | ICD-10-CM | POA: Diagnosis not present

## 2017-05-08 DIAGNOSIS — I480 Paroxysmal atrial fibrillation: Secondary | ICD-10-CM | POA: Diagnosis not present

## 2017-05-08 DIAGNOSIS — Z6823 Body mass index (BMI) 23.0-23.9, adult: Secondary | ICD-10-CM | POA: Diagnosis not present

## 2017-05-08 DIAGNOSIS — I10 Essential (primary) hypertension: Secondary | ICD-10-CM | POA: Diagnosis not present

## 2017-05-09 ENCOUNTER — Ambulatory Visit (INDEPENDENT_AMBULATORY_CARE_PROVIDER_SITE_OTHER): Payer: Medicare Other | Admitting: *Deleted

## 2017-05-09 DIAGNOSIS — I495 Sick sinus syndrome: Secondary | ICD-10-CM | POA: Diagnosis not present

## 2017-05-09 NOTE — Progress Notes (Signed)
Remote pacemaker transmission.   

## 2017-05-12 ENCOUNTER — Other Ambulatory Visit: Payer: Self-pay | Admitting: Internal Medicine

## 2017-05-15 ENCOUNTER — Other Ambulatory Visit (HOSPITAL_COMMUNITY): Payer: Self-pay | Admitting: *Deleted

## 2017-05-16 LAB — CUP PACEART REMOTE DEVICE CHECK
Battery Remaining Longevity: 65 mo
Brady Statistic AP VP Percent: 50 %
Brady Statistic AP VS Percent: 48 %
Brady Statistic AS VP Percent: 1 %
Brady Statistic RA Percent Paced: 63 %
Implantable Lead Implant Date: 20121016
Implantable Lead Location: 753860
Implantable Lead Model: 1948
Lead Channel Impedance Value: 400 Ohm
Lead Channel Impedance Value: 640 Ohm
Lead Channel Pacing Threshold Amplitude: 0.5 V
Lead Channel Pacing Threshold Pulse Width: 0.5 ms
Lead Channel Pacing Threshold Pulse Width: 0.8 ms
Lead Channel Sensing Intrinsic Amplitude: 12 mV
Lead Channel Setting Pacing Amplitude: 2 V
Lead Channel Setting Pacing Amplitude: 2.5 V
Lead Channel Setting Pacing Pulse Width: 0.5 ms
MDC IDC LEAD IMPLANT DT: 20121016
MDC IDC LEAD LOCATION: 753859
MDC IDC MSMT BATTERY REMAINING PERCENTAGE: 65 %
MDC IDC MSMT BATTERY VOLTAGE: 2.9 V
MDC IDC MSMT LEADCHNL RA PACING THRESHOLD AMPLITUDE: 1 V
MDC IDC MSMT LEADCHNL RA SENSING INTR AMPL: 0.4 mV
MDC IDC PG IMPLANT DT: 20121016
MDC IDC PG SERIAL: 7280874
MDC IDC SESS DTM: 20180822060008
MDC IDC SET LEADCHNL RV SENSING SENSITIVITY: 2 mV
MDC IDC STAT BRADY AS VS PERCENT: 1 %
MDC IDC STAT BRADY RV PERCENT PACED: 51 %

## 2017-05-17 ENCOUNTER — Inpatient Hospital Stay (HOSPITAL_COMMUNITY): Admission: RE | Admit: 2017-05-17 | Payer: Medicare Other | Source: Ambulatory Visit

## 2017-05-18 ENCOUNTER — Encounter: Payer: Self-pay | Admitting: Cardiology

## 2017-05-24 ENCOUNTER — Inpatient Hospital Stay (HOSPITAL_COMMUNITY): Admission: RE | Admit: 2017-05-24 | Payer: Medicare Other | Source: Ambulatory Visit

## 2017-05-28 ENCOUNTER — Encounter (HOSPITAL_COMMUNITY)
Admission: RE | Admit: 2017-05-28 | Discharge: 2017-05-28 | Disposition: A | Discharge status: Home / Self Care | Payer: Medicare Other | Source: Ambulatory Visit | Attending: Internal Medicine | Admitting: Internal Medicine

## 2017-05-28 DIAGNOSIS — M810 Age-related osteoporosis without current pathological fracture: Secondary | ICD-10-CM | POA: Insufficient documentation

## 2017-05-28 MED ORDER — DENOSUMAB 60 MG/ML ~~LOC~~ SOLN
60.0000 mg | Freq: Once | SUBCUTANEOUS | Status: AC
  Administered 2017-05-28: 09:00:00 60 mg via SUBCUTANEOUS
  Filled 2017-05-28: qty 1

## 2017-05-30 DIAGNOSIS — L821 Other seborrheic keratosis: Secondary | ICD-10-CM | POA: Diagnosis not present

## 2017-05-30 DIAGNOSIS — L853 Xerosis cutis: Secondary | ICD-10-CM | POA: Diagnosis not present

## 2017-05-30 DIAGNOSIS — Z85828 Personal history of other malignant neoplasm of skin: Secondary | ICD-10-CM | POA: Diagnosis not present

## 2017-05-30 DIAGNOSIS — L570 Actinic keratosis: Secondary | ICD-10-CM | POA: Diagnosis not present

## 2017-06-13 ENCOUNTER — Encounter: Payer: Self-pay | Admitting: Cardiology

## 2017-06-19 NOTE — Progress Notes (Signed)
[name] Date of Birth: 06-Nov-1924 Medical Record #[id number]  History of Present Illness: [name] is seen back today for follow up of CHF and sick sinus syndrome.  She has chronic diastolic CHF, tachy brady with PPM in place, past cardioversions in October of 2012 and again in January of 2014. Recurrent atrial fib in June 2014 and was cardioverted and started on Multaq. Has been intolerant to amiodarone due to possible pulmonary toxicity. Then failed on Multaq due to recurrent arrhythmia. She has been  on Tikosyn.  On follow up today she states she is concerned about her right rotator cuff. She has seen Dr. Veverly Fells and has had a series of cortisone injections over the past year. She has bone on bone and the only other option for her is to have surgery. She is concerned about her surgical risk. She has intermittent dyspnea and now uses oxygen on a prn basis. No significant edema. No chest pain or palpitations. No dizziness. Weight continues to decline. On her last pacer check 05/09/17 she was noted to have mode switches 36% of the time (down from 54 % prior). She did have some LE edema but this has improved.   Current Outpatient Prescriptions  Medication Sig Dispense Refill  . BENICAR 40 MG tablet TAKE 1 TABLET DAILY 90 tablet 2  . Calcium Citrate (CITRACAL PO) Take 1 tablet by mouth 2 (two) times daily.     . Cholecalciferol (VITAMIN D) 400 UNITS capsule Take 400 Units by mouth 3 (three) times daily.    . divalproex (DEPAKOTE) 500 MG EC tablet Take 500 mg by mouth at bedtime.      . dofetilide (TIKOSYN) 250 MCG capsule TAKE 1 CAPSULE TWICE A DAY 180 capsule 3  . dorzolamide-timolol (COSOPT) 22.3-6.8 MG/ML ophthalmic solution Put one drop in right eye twice a day (use as directed)  4  . furosemide (LASIX) 40 MG tablet Take 1 tablet (40 mg total) by mouth daily. 90 tablet 2  . HYDROcodone-acetaminophen (VICODIN) 5-500 MG per tablet Take 1 tablet by mouth every 6 (six) hours as needed. For pain     . KLOR-CON M10 10 MEQ tablet TAKE 1 TABLET TWICE A DAY 180 tablet 3  . LATANOPROST OP Place 1 drop into the right eye at bedtime.    . metoprolol tartrate (LOPRESSOR) 25 MG tablet TAKE 1 TABLET TWICE A DAY 180 tablet 3  . mirabegron ER (MYRBETRIQ) 50 MG TB24 tablet Take 50 mg by mouth daily.    . Multiple Vitamins-Minerals (MULTIVITAMINS THER. W/MINERALS) TABS Take 1 tablet by mouth daily.      . pantoprazole (PROTONIX) 40 MG tablet Take 1 tablet by mouth daily.    . polyethylene glycol powder (GLYCOLAX/MIRALAX) powder Take 17 g by mouth daily as needed. For constipation    . PRADAXA 75 MG CAPS capsule TAKE 1 CAPSULE EVERY 12 HOURS 180 capsule 1  . prednisoLONE acetate (PRED FORTE) 1 % ophthalmic suspension Place 1 drop into the left eye 2 (two) times daily.    Marland Kitchen tolterodine (DETROL LA) 4 MG 24 hr capsule Take 4 mg by mouth every evening.     . VESICARE 5 MG tablet Take 5 mg by mouth daily.      No current facility-administered medications for this visit.     Allergies  Allergen Reactions  . Amiodarone     Possibly caused pneumonitis    Past Medical History:  Diagnosis Date  . (HFpEF) heart failure with preserved ejection fraction (  [place])   . Arthritis   . Atrial fibrillation [facility])    Prior TEE/CV in May 2012; managed with rate control and anticoagulation  . Back pain    CHRONIC  . Dysphagia   . GERD (gastroesophageal reflux disease)   . Hiatal hernia   . Hypertension   . Hyponatremia    improved  . Osteopenia   . Pacemaker   . Pneumonia 04/2010  . Renal artery stenosis ([place]) 2008   STATUS POST ANGIOPLASTY  . Seizure disorder ([place])    on anti-epileptic therapy  . SIADH (syndrome of inappropriate ADH production) ([place])    h/o HYPONATREMIA ; LIKELY RELATED TO HER LUNG PROCESS  . Tachy-brady syndrome [facility])    with pauses noted in May 2012; s/p PTVP October 2012  . Urinary incontinence     Past Surgical History:  Procedure Laterality Date  . APPENDECTOMY    . BACK  SURGERY     X2  . CARDIAC CATHETERIZATION  2007   Normal  . CARDIOVERSION    . CARDIOVERSION  10/14/2012   Procedure: CARDIOVERSION;  Surgeon: Thayer Headings, MD;  Location: [facility] ENDOSCOPY;  Service: Cardiovascular;  Laterality: N/A;  . CARDIOVERSION N/A 05/30/2013   Procedure: CARDIOVERSION;  Surgeon: Thompson Grayer, MD;  Location: [place];  Service: Cardiovascular;  Laterality: N/A;  . CARDIOVERSION N/A 03/03/2013   Procedure: CARDIOVERSION;  Surgeon: Deboraha Sprang, MD;  Location: [facility] CATH LAB;  Service: Cardiovascular;  Laterality: N/A;  . CATARACT EXTRACTION    . CATARACT EXTRACTION Bilateral   . CHOLECYSTECTOMY    . KNEE ARTHROSCOPY Left   . PACEMAKER INSERTION  06/2011   SJM Accent DR RF implanted by Dr Rayann Heman  . PARTIAL HYSTERECTOMY    . RENAL ANGIOPLASTY Right 01/2007  . SHOULDER SURGERY    . TONSILLECTOMY    . TOTAL KNEE ARTHROPLASTY Bilateral     History  Smoking Status  . Never Smoker  Smokeless Tobacco  . Never Used    History  Alcohol Use No    Family History  Problem Relation Age of Onset  . Stroke Mother   . Diabetes Mother   . Heart disease Mother        Unkown  . Stroke Father   . Diabetes Father   . Prostate cancer Father   . Kidney failure Brother   . Lung disease Brother   . Lung disease Sister   . Hypertension Daughter   . Hypertension Son   . Bladder Cancer Brother     Review of Systems: The review of systems is per the HPI.  All other systems were reviewed and are negative.  Physical Exam: BP 118/70   Pulse 82   Ht 5\' 3"  (1.6 m)   Wt 124 lb 12.8 oz (56.6 kg)   BMI 22.11 kg/m  GENERAL:  Elderly WF in NAD HEENT:  PERRL, EOMI, sclera are clear. Oropharynx is clear. NECK:  No jugular venous distention, carotid upstroke brisk and symmetric, no bruits, no thyromegaly or adenopathy LUNGS:  Clear to auscultation bilaterally CHEST:  Unremarkable HEART:  IRRR,  PMI not displaced or sustained,S1 and S2 within normal limits, no S3, no S4: no  clicks, no rubs, no murmurs ABD:  Soft, nontender. BS +, no masses or bruits. No hepatomegaly, no splenomegaly EXT:  2 + pulses throughout, no edema, no cyanosis no clubbing. Limited range of motion right shoulder.  SKIN:  Warm and dry. Hyperpigmentation in LE.  NEURO:  Alert and oriented  x 3. Cranial nerves II through XII intact. PSYCH:  Cognitively intact    LABORATORY DATA:  Lab Results  Component Value Date   WBC 6.8 11/05/2015   HGB 13.4 11/05/2015   HCT 40.1 11/05/2015   PLT 196 11/05/2015   GLUCOSE 79 11/05/2015   ALT 26 11/23/2011   AST 31 11/23/2011   NA 129 (L) 11/05/2015   K 4.5 11/05/2015   CL 92 (L) 11/05/2015   CREATININE 0.70 11/05/2015   BUN 14 11/05/2015   CO2 30 11/05/2015   TSH 1.25 05/08/2013   INR 1.69 (H) 03/02/2013   Labs dated 11/10/16: normal BMET and magnesium level.  Assessment / Plan: 1. PAF - now on Tikosyn -  Recent pacemaker check showed continued improvement in degree of AFib since on coumadin.  Rate is controlled. Continue Tikosyn and metoprolol. On Pradaxa for anticoagulation.  2. Tachy/brady - with PPM in place - followed by Dr. Rayann Heman  3. HTN - well controlled today.  4. Chronic diastolic HF - looks well compensated. No significant edema and weight continues to decline. Limit sodium intake.  5. Rotator cuff dysfunction with pain. At this point I think she is at moderate risk based on age and co-morbidities. It basically boils down to a quality of life determination for her. She states she is not quite at the point where she would have surgery but her husband is a little more inclined to proceed.   I will follow up in 6 months.

## 2017-06-20 ENCOUNTER — Ambulatory Visit
Admission: RE | Admit: 2017-06-20 | Discharge: 2017-06-20 | Disposition: A | Discharge status: Home / Self Care | Payer: Medicare Other | Source: Ambulatory Visit | Attending: Internal Medicine | Admitting: Internal Medicine

## 2017-06-20 DIAGNOSIS — Z1231 Encounter for screening mammogram for malignant neoplasm of breast: Secondary | ICD-10-CM

## 2017-06-21 ENCOUNTER — Ambulatory Visit (INDEPENDENT_AMBULATORY_CARE_PROVIDER_SITE_OTHER): Payer: Medicare Other | Admitting: Cardiology

## 2017-06-21 ENCOUNTER — Encounter: Payer: Self-pay | Admitting: Cardiology

## 2017-06-21 VITALS — BP 118/70 | HR 82 | Ht 63.0 in | Wt 124.8 lb

## 2017-06-21 DIAGNOSIS — I495 Sick sinus syndrome: Secondary | ICD-10-CM

## 2017-06-21 DIAGNOSIS — I480 Paroxysmal atrial fibrillation: Secondary | ICD-10-CM | POA: Diagnosis not present

## 2017-06-21 DIAGNOSIS — I5032 Chronic diastolic (congestive) heart failure: Secondary | ICD-10-CM | POA: Diagnosis not present

## 2017-06-21 DIAGNOSIS — Z7901 Long term (current) use of anticoagulants: Secondary | ICD-10-CM | POA: Diagnosis not present

## 2017-06-21 NOTE — Patient Instructions (Signed)
Continue your current therapy  I will see you in 6 months.   

## 2017-06-30 DIAGNOSIS — Z23 Encounter for immunization: Secondary | ICD-10-CM | POA: Diagnosis not present

## 2017-07-09 ENCOUNTER — Encounter: Payer: Self-pay | Admitting: Podiatry

## 2017-07-09 ENCOUNTER — Ambulatory Visit (INDEPENDENT_AMBULATORY_CARE_PROVIDER_SITE_OTHER): Payer: Medicare Other | Admitting: Podiatry

## 2017-07-09 DIAGNOSIS — R531 Weakness: Secondary | ICD-10-CM | POA: Diagnosis not present

## 2017-07-09 DIAGNOSIS — B351 Tinea unguium: Secondary | ICD-10-CM | POA: Diagnosis not present

## 2017-07-09 DIAGNOSIS — M79676 Pain in unspecified toe(s): Secondary | ICD-10-CM

## 2017-07-09 NOTE — Progress Notes (Signed)
Patient ID: [name], female   DOB: 29-Mar-1925, 83 y.o.   MRN: [id number]    Subjective: This patient presents again today for scheduled visit complaining of elongated and thickened toenails which are uncomfortable when walking and wearing shoes and request toenail debridement  Objective: Patient transferred from wheelchair to treatment chair Orientated 3 DP pulses 2/4 bilaterally PT pulses 1/4 bilaterally Capillary reflex immediate bilaterally Sensation to 10 g monofilament wire intact 5/5 right 4/5 left Vibratory sensation reactive bilaterally Ankle reflexes reactive bilaterally Dry, Atrophic skin bilaterally Absent hair growth bilaterally Hammertoe 2-5 bilaterally No open skin lesions bilaterally The toenails are elongated, discolored, hypertrophic and tender to direct palpation 6-10 Manual motor testing dorsi flexion, plantar flexion 5/5 bilaterally   Assessment: Symptomatic onychomycoses 6-10  Plan: Debridement toenails 6-10 mechanically and electronically without any bleeding  Reappoint 3 months

## 2017-07-11 DIAGNOSIS — N390 Urinary tract infection, site not specified: Secondary | ICD-10-CM | POA: Diagnosis not present

## 2017-07-11 DIAGNOSIS — R82998 Other abnormal findings in urine: Secondary | ICD-10-CM | POA: Diagnosis not present

## 2017-07-13 DIAGNOSIS — R5383 Other fatigue: Secondary | ICD-10-CM | POA: Diagnosis not present

## 2017-07-13 DIAGNOSIS — F0281 Dementia in other diseases classified elsewhere with behavioral disturbance: Secondary | ICD-10-CM | POA: Diagnosis not present

## 2017-07-13 DIAGNOSIS — G309 Alzheimer's disease, unspecified: Secondary | ICD-10-CM | POA: Diagnosis not present

## 2017-07-13 DIAGNOSIS — M810 Age-related osteoporosis without current pathological fracture: Secondary | ICD-10-CM | POA: Diagnosis not present

## 2017-07-13 DIAGNOSIS — I5030 Unspecified diastolic (congestive) heart failure: Secondary | ICD-10-CM | POA: Diagnosis not present

## 2017-07-13 DIAGNOSIS — I480 Paroxysmal atrial fibrillation: Secondary | ICD-10-CM | POA: Diagnosis not present

## 2017-07-13 DIAGNOSIS — N390 Urinary tract infection, site not specified: Secondary | ICD-10-CM | POA: Diagnosis not present

## 2017-07-13 DIAGNOSIS — Z96653 Presence of artificial knee joint, bilateral: Secondary | ICD-10-CM | POA: Diagnosis not present

## 2017-07-13 DIAGNOSIS — H409 Unspecified glaucoma: Secondary | ICD-10-CM | POA: Diagnosis not present

## 2017-07-13 DIAGNOSIS — H4089 Other specified glaucoma: Secondary | ICD-10-CM | POA: Diagnosis not present

## 2017-07-13 DIAGNOSIS — J849 Interstitial pulmonary disease, unspecified: Secondary | ICD-10-CM | POA: Diagnosis not present

## 2017-07-13 DIAGNOSIS — R569 Unspecified convulsions: Secondary | ICD-10-CM | POA: Diagnosis not present

## 2017-07-13 DIAGNOSIS — Z950 Presence of cardiac pacemaker: Secondary | ICD-10-CM | POA: Diagnosis not present

## 2017-07-13 DIAGNOSIS — R2689 Other abnormalities of gait and mobility: Secondary | ICD-10-CM | POA: Diagnosis not present

## 2017-07-13 DIAGNOSIS — Z9981 Dependence on supplemental oxygen: Secondary | ICD-10-CM | POA: Diagnosis not present

## 2017-07-13 DIAGNOSIS — R269 Unspecified abnormalities of gait and mobility: Secondary | ICD-10-CM | POA: Diagnosis not present

## 2017-07-13 DIAGNOSIS — I110 Hypertensive heart disease with heart failure: Secondary | ICD-10-CM | POA: Diagnosis not present

## 2017-07-16 DIAGNOSIS — R5383 Other fatigue: Secondary | ICD-10-CM | POA: Diagnosis not present

## 2017-07-16 DIAGNOSIS — G309 Alzheimer's disease, unspecified: Secondary | ICD-10-CM | POA: Diagnosis not present

## 2017-07-16 DIAGNOSIS — F0281 Dementia in other diseases classified elsewhere with behavioral disturbance: Secondary | ICD-10-CM | POA: Diagnosis not present

## 2017-07-16 DIAGNOSIS — R269 Unspecified abnormalities of gait and mobility: Secondary | ICD-10-CM | POA: Diagnosis not present

## 2017-07-16 DIAGNOSIS — I110 Hypertensive heart disease with heart failure: Secondary | ICD-10-CM | POA: Diagnosis not present

## 2017-07-16 DIAGNOSIS — N390 Urinary tract infection, site not specified: Secondary | ICD-10-CM | POA: Diagnosis not present

## 2017-07-17 DIAGNOSIS — N390 Urinary tract infection, site not specified: Secondary | ICD-10-CM | POA: Diagnosis not present

## 2017-07-17 DIAGNOSIS — R269 Unspecified abnormalities of gait and mobility: Secondary | ICD-10-CM | POA: Diagnosis not present

## 2017-07-17 DIAGNOSIS — F0281 Dementia in other diseases classified elsewhere with behavioral disturbance: Secondary | ICD-10-CM | POA: Diagnosis not present

## 2017-07-17 DIAGNOSIS — G309 Alzheimer's disease, unspecified: Secondary | ICD-10-CM | POA: Diagnosis not present

## 2017-07-17 DIAGNOSIS — I110 Hypertensive heart disease with heart failure: Secondary | ICD-10-CM | POA: Diagnosis not present

## 2017-07-17 DIAGNOSIS — R5383 Other fatigue: Secondary | ICD-10-CM | POA: Diagnosis not present

## 2017-07-18 DIAGNOSIS — I110 Hypertensive heart disease with heart failure: Secondary | ICD-10-CM | POA: Diagnosis not present

## 2017-07-18 DIAGNOSIS — F0281 Dementia in other diseases classified elsewhere with behavioral disturbance: Secondary | ICD-10-CM | POA: Diagnosis not present

## 2017-07-18 DIAGNOSIS — R269 Unspecified abnormalities of gait and mobility: Secondary | ICD-10-CM | POA: Diagnosis not present

## 2017-07-18 DIAGNOSIS — N390 Urinary tract infection, site not specified: Secondary | ICD-10-CM | POA: Diagnosis not present

## 2017-07-18 DIAGNOSIS — R5383 Other fatigue: Secondary | ICD-10-CM | POA: Diagnosis not present

## 2017-07-18 DIAGNOSIS — G309 Alzheimer's disease, unspecified: Secondary | ICD-10-CM | POA: Diagnosis not present

## 2017-07-19 DIAGNOSIS — R531 Weakness: Secondary | ICD-10-CM | POA: Diagnosis not present

## 2017-07-20 DIAGNOSIS — G309 Alzheimer's disease, unspecified: Secondary | ICD-10-CM | POA: Diagnosis not present

## 2017-07-20 DIAGNOSIS — N390 Urinary tract infection, site not specified: Secondary | ICD-10-CM | POA: Diagnosis not present

## 2017-07-20 DIAGNOSIS — R269 Unspecified abnormalities of gait and mobility: Secondary | ICD-10-CM | POA: Diagnosis not present

## 2017-07-20 DIAGNOSIS — F0281 Dementia in other diseases classified elsewhere with behavioral disturbance: Secondary | ICD-10-CM | POA: Diagnosis not present

## 2017-07-20 DIAGNOSIS — R5383 Other fatigue: Secondary | ICD-10-CM | POA: Diagnosis not present

## 2017-07-20 DIAGNOSIS — I110 Hypertensive heart disease with heart failure: Secondary | ICD-10-CM | POA: Diagnosis not present

## 2017-07-23 DIAGNOSIS — I110 Hypertensive heart disease with heart failure: Secondary | ICD-10-CM | POA: Diagnosis not present

## 2017-07-23 DIAGNOSIS — R5383 Other fatigue: Secondary | ICD-10-CM | POA: Diagnosis not present

## 2017-07-23 DIAGNOSIS — R269 Unspecified abnormalities of gait and mobility: Secondary | ICD-10-CM | POA: Diagnosis not present

## 2017-07-23 DIAGNOSIS — G309 Alzheimer's disease, unspecified: Secondary | ICD-10-CM | POA: Diagnosis not present

## 2017-07-23 DIAGNOSIS — F0281 Dementia in other diseases classified elsewhere with behavioral disturbance: Secondary | ICD-10-CM | POA: Diagnosis not present

## 2017-07-23 DIAGNOSIS — N390 Urinary tract infection, site not specified: Secondary | ICD-10-CM | POA: Diagnosis not present

## 2017-07-24 DIAGNOSIS — I110 Hypertensive heart disease with heart failure: Secondary | ICD-10-CM | POA: Diagnosis not present

## 2017-07-24 DIAGNOSIS — N390 Urinary tract infection, site not specified: Secondary | ICD-10-CM | POA: Diagnosis not present

## 2017-07-24 DIAGNOSIS — G309 Alzheimer's disease, unspecified: Secondary | ICD-10-CM | POA: Diagnosis not present

## 2017-07-24 DIAGNOSIS — F0281 Dementia in other diseases classified elsewhere with behavioral disturbance: Secondary | ICD-10-CM | POA: Diagnosis not present

## 2017-07-24 DIAGNOSIS — R269 Unspecified abnormalities of gait and mobility: Secondary | ICD-10-CM | POA: Diagnosis not present

## 2017-07-24 DIAGNOSIS — R5383 Other fatigue: Secondary | ICD-10-CM | POA: Diagnosis not present

## 2017-07-26 DIAGNOSIS — G309 Alzheimer's disease, unspecified: Secondary | ICD-10-CM | POA: Diagnosis not present

## 2017-07-26 DIAGNOSIS — F0281 Dementia in other diseases classified elsewhere with behavioral disturbance: Secondary | ICD-10-CM | POA: Diagnosis not present

## 2017-07-26 DIAGNOSIS — R269 Unspecified abnormalities of gait and mobility: Secondary | ICD-10-CM | POA: Diagnosis not present

## 2017-07-26 DIAGNOSIS — I110 Hypertensive heart disease with heart failure: Secondary | ICD-10-CM | POA: Diagnosis not present

## 2017-07-26 DIAGNOSIS — R5383 Other fatigue: Secondary | ICD-10-CM | POA: Diagnosis not present

## 2017-07-26 DIAGNOSIS — N390 Urinary tract infection, site not specified: Secondary | ICD-10-CM | POA: Diagnosis not present

## 2017-07-31 DIAGNOSIS — I110 Hypertensive heart disease with heart failure: Secondary | ICD-10-CM | POA: Diagnosis not present

## 2017-07-31 DIAGNOSIS — R269 Unspecified abnormalities of gait and mobility: Secondary | ICD-10-CM | POA: Diagnosis not present

## 2017-07-31 DIAGNOSIS — N390 Urinary tract infection, site not specified: Secondary | ICD-10-CM | POA: Diagnosis not present

## 2017-07-31 DIAGNOSIS — G309 Alzheimer's disease, unspecified: Secondary | ICD-10-CM | POA: Diagnosis not present

## 2017-07-31 DIAGNOSIS — R5383 Other fatigue: Secondary | ICD-10-CM | POA: Diagnosis not present

## 2017-07-31 DIAGNOSIS — F0281 Dementia in other diseases classified elsewhere with behavioral disturbance: Secondary | ICD-10-CM | POA: Diagnosis not present

## 2017-08-01 DIAGNOSIS — F0281 Dementia in other diseases classified elsewhere with behavioral disturbance: Secondary | ICD-10-CM | POA: Diagnosis not present

## 2017-08-01 DIAGNOSIS — N390 Urinary tract infection, site not specified: Secondary | ICD-10-CM | POA: Diagnosis not present

## 2017-08-01 DIAGNOSIS — R5383 Other fatigue: Secondary | ICD-10-CM | POA: Diagnosis not present

## 2017-08-01 DIAGNOSIS — G309 Alzheimer's disease, unspecified: Secondary | ICD-10-CM | POA: Diagnosis not present

## 2017-08-01 DIAGNOSIS — R269 Unspecified abnormalities of gait and mobility: Secondary | ICD-10-CM | POA: Diagnosis not present

## 2017-08-01 DIAGNOSIS — I110 Hypertensive heart disease with heart failure: Secondary | ICD-10-CM | POA: Diagnosis not present

## 2017-08-02 DIAGNOSIS — G309 Alzheimer's disease, unspecified: Secondary | ICD-10-CM | POA: Diagnosis not present

## 2017-08-02 DIAGNOSIS — N390 Urinary tract infection, site not specified: Secondary | ICD-10-CM | POA: Diagnosis not present

## 2017-08-02 DIAGNOSIS — R5383 Other fatigue: Secondary | ICD-10-CM | POA: Diagnosis not present

## 2017-08-02 DIAGNOSIS — I110 Hypertensive heart disease with heart failure: Secondary | ICD-10-CM | POA: Diagnosis not present

## 2017-08-02 DIAGNOSIS — R269 Unspecified abnormalities of gait and mobility: Secondary | ICD-10-CM | POA: Diagnosis not present

## 2017-08-02 DIAGNOSIS — F0281 Dementia in other diseases classified elsewhere with behavioral disturbance: Secondary | ICD-10-CM | POA: Diagnosis not present

## 2017-08-07 DIAGNOSIS — R5383 Other fatigue: Secondary | ICD-10-CM | POA: Diagnosis not present

## 2017-08-07 DIAGNOSIS — I110 Hypertensive heart disease with heart failure: Secondary | ICD-10-CM | POA: Diagnosis not present

## 2017-08-07 DIAGNOSIS — G309 Alzheimer's disease, unspecified: Secondary | ICD-10-CM | POA: Diagnosis not present

## 2017-08-07 DIAGNOSIS — F0281 Dementia in other diseases classified elsewhere with behavioral disturbance: Secondary | ICD-10-CM | POA: Diagnosis not present

## 2017-08-07 DIAGNOSIS — R269 Unspecified abnormalities of gait and mobility: Secondary | ICD-10-CM | POA: Diagnosis not present

## 2017-08-07 DIAGNOSIS — N390 Urinary tract infection, site not specified: Secondary | ICD-10-CM | POA: Diagnosis not present

## 2017-08-08 ENCOUNTER — Ambulatory Visit (INDEPENDENT_AMBULATORY_CARE_PROVIDER_SITE_OTHER): Payer: Medicare Other | Admitting: *Deleted

## 2017-08-08 DIAGNOSIS — N390 Urinary tract infection, site not specified: Secondary | ICD-10-CM | POA: Diagnosis not present

## 2017-08-08 DIAGNOSIS — I495 Sick sinus syndrome: Secondary | ICD-10-CM

## 2017-08-08 DIAGNOSIS — R5383 Other fatigue: Secondary | ICD-10-CM | POA: Diagnosis not present

## 2017-08-08 DIAGNOSIS — R269 Unspecified abnormalities of gait and mobility: Secondary | ICD-10-CM | POA: Diagnosis not present

## 2017-08-08 DIAGNOSIS — G309 Alzheimer's disease, unspecified: Secondary | ICD-10-CM | POA: Diagnosis not present

## 2017-08-08 DIAGNOSIS — F0281 Dementia in other diseases classified elsewhere with behavioral disturbance: Secondary | ICD-10-CM | POA: Diagnosis not present

## 2017-08-08 DIAGNOSIS — I110 Hypertensive heart disease with heart failure: Secondary | ICD-10-CM | POA: Diagnosis not present

## 2017-08-08 NOTE — Progress Notes (Signed)
Remote pacemaker transmission.   

## 2017-08-13 DIAGNOSIS — N390 Urinary tract infection, site not specified: Secondary | ICD-10-CM | POA: Diagnosis not present

## 2017-08-13 DIAGNOSIS — G309 Alzheimer's disease, unspecified: Secondary | ICD-10-CM | POA: Diagnosis not present

## 2017-08-13 DIAGNOSIS — R5383 Other fatigue: Secondary | ICD-10-CM | POA: Diagnosis not present

## 2017-08-13 DIAGNOSIS — F0281 Dementia in other diseases classified elsewhere with behavioral disturbance: Secondary | ICD-10-CM | POA: Diagnosis not present

## 2017-08-13 DIAGNOSIS — R269 Unspecified abnormalities of gait and mobility: Secondary | ICD-10-CM | POA: Diagnosis not present

## 2017-08-13 DIAGNOSIS — I110 Hypertensive heart disease with heart failure: Secondary | ICD-10-CM | POA: Diagnosis not present

## 2017-08-15 DIAGNOSIS — G309 Alzheimer's disease, unspecified: Secondary | ICD-10-CM | POA: Diagnosis not present

## 2017-08-15 DIAGNOSIS — I110 Hypertensive heart disease with heart failure: Secondary | ICD-10-CM | POA: Diagnosis not present

## 2017-08-15 DIAGNOSIS — R269 Unspecified abnormalities of gait and mobility: Secondary | ICD-10-CM | POA: Diagnosis not present

## 2017-08-15 DIAGNOSIS — R5383 Other fatigue: Secondary | ICD-10-CM | POA: Diagnosis not present

## 2017-08-15 DIAGNOSIS — N390 Urinary tract infection, site not specified: Secondary | ICD-10-CM | POA: Diagnosis not present

## 2017-08-15 DIAGNOSIS — F0281 Dementia in other diseases classified elsewhere with behavioral disturbance: Secondary | ICD-10-CM | POA: Diagnosis not present

## 2017-08-17 ENCOUNTER — Encounter: Payer: Self-pay | Admitting: Cardiology

## 2017-08-17 DIAGNOSIS — G309 Alzheimer's disease, unspecified: Secondary | ICD-10-CM | POA: Diagnosis not present

## 2017-08-17 DIAGNOSIS — I110 Hypertensive heart disease with heart failure: Secondary | ICD-10-CM | POA: Diagnosis not present

## 2017-08-17 DIAGNOSIS — R5383 Other fatigue: Secondary | ICD-10-CM | POA: Diagnosis not present

## 2017-08-17 DIAGNOSIS — N390 Urinary tract infection, site not specified: Secondary | ICD-10-CM | POA: Diagnosis not present

## 2017-08-17 DIAGNOSIS — R269 Unspecified abnormalities of gait and mobility: Secondary | ICD-10-CM | POA: Diagnosis not present

## 2017-08-17 DIAGNOSIS — F0281 Dementia in other diseases classified elsewhere with behavioral disturbance: Secondary | ICD-10-CM | POA: Diagnosis not present

## 2017-08-20 DIAGNOSIS — G309 Alzheimer's disease, unspecified: Secondary | ICD-10-CM | POA: Diagnosis not present

## 2017-08-20 DIAGNOSIS — I110 Hypertensive heart disease with heart failure: Secondary | ICD-10-CM | POA: Diagnosis not present

## 2017-08-20 DIAGNOSIS — N390 Urinary tract infection, site not specified: Secondary | ICD-10-CM | POA: Diagnosis not present

## 2017-08-20 DIAGNOSIS — R269 Unspecified abnormalities of gait and mobility: Secondary | ICD-10-CM | POA: Diagnosis not present

## 2017-08-20 DIAGNOSIS — F0281 Dementia in other diseases classified elsewhere with behavioral disturbance: Secondary | ICD-10-CM | POA: Diagnosis not present

## 2017-08-20 DIAGNOSIS — R5383 Other fatigue: Secondary | ICD-10-CM | POA: Diagnosis not present

## 2017-08-21 DIAGNOSIS — R8290 Unspecified abnormal findings in urine: Secondary | ICD-10-CM | POA: Diagnosis not present

## 2017-08-21 DIAGNOSIS — N390 Urinary tract infection, site not specified: Secondary | ICD-10-CM | POA: Diagnosis not present

## 2017-08-24 DIAGNOSIS — R269 Unspecified abnormalities of gait and mobility: Secondary | ICD-10-CM | POA: Diagnosis not present

## 2017-08-24 DIAGNOSIS — F0281 Dementia in other diseases classified elsewhere with behavioral disturbance: Secondary | ICD-10-CM | POA: Diagnosis not present

## 2017-08-24 DIAGNOSIS — R5383 Other fatigue: Secondary | ICD-10-CM | POA: Diagnosis not present

## 2017-08-24 DIAGNOSIS — N390 Urinary tract infection, site not specified: Secondary | ICD-10-CM | POA: Diagnosis not present

## 2017-08-24 DIAGNOSIS — I110 Hypertensive heart disease with heart failure: Secondary | ICD-10-CM | POA: Diagnosis not present

## 2017-08-24 DIAGNOSIS — G309 Alzheimer's disease, unspecified: Secondary | ICD-10-CM | POA: Diagnosis not present

## 2017-08-28 DIAGNOSIS — M6389 Disorders of muscle in diseases classified elsewhere, multiple sites: Secondary | ICD-10-CM | POA: Diagnosis not present

## 2017-08-28 DIAGNOSIS — R293 Abnormal posture: Secondary | ICD-10-CM | POA: Diagnosis not present

## 2017-08-28 DIAGNOSIS — R278 Other lack of coordination: Secondary | ICD-10-CM | POA: Diagnosis not present

## 2017-08-28 DIAGNOSIS — R2681 Unsteadiness on feet: Secondary | ICD-10-CM | POA: Diagnosis not present

## 2017-08-28 DIAGNOSIS — I4891 Unspecified atrial fibrillation: Secondary | ICD-10-CM | POA: Diagnosis not present

## 2017-08-28 LAB — CUP PACEART REMOTE DEVICE CHECK
Battery Remaining Longevity: 56 mo
Battery Remaining Percentage: 57 %
Battery Voltage: 2.89 V
Brady Statistic AS VS Percent: 1 %
Brady Statistic RA Percent Paced: 67 %
Brady Statistic RV Percent Paced: 50 %
Date Time Interrogation Session: 20181121072049
Implantable Lead Implant Date: 20121016
Implantable Lead Implant Date: 20121016
Implantable Lead Location: 753859
Implantable Pulse Generator Implant Date: 20121016
Lead Channel Impedance Value: 410 Ohm
Lead Channel Pacing Threshold Amplitude: 1 V
Lead Channel Pacing Threshold Pulse Width: 0.5 ms
Lead Channel Pacing Threshold Pulse Width: 0.8 ms
Lead Channel Sensing Intrinsic Amplitude: 0.6 mV
Lead Channel Setting Pacing Amplitude: 2.5 V
Lead Channel Setting Sensing Sensitivity: 2 mV
MDC IDC LEAD LOCATION: 753860
MDC IDC MSMT LEADCHNL RV IMPEDANCE VALUE: 640 Ohm
MDC IDC MSMT LEADCHNL RV PACING THRESHOLD AMPLITUDE: 0.5 V
MDC IDC MSMT LEADCHNL RV SENSING INTR AMPL: 12 mV
MDC IDC PG SERIAL: 7280874
MDC IDC SET LEADCHNL RA PACING AMPLITUDE: 2 V
MDC IDC SET LEADCHNL RV PACING PULSEWIDTH: 0.5 ms
MDC IDC STAT BRADY AP VP PERCENT: 49 %
MDC IDC STAT BRADY AP VS PERCENT: 49 %
MDC IDC STAT BRADY AS VP PERCENT: 1 %

## 2017-08-30 DIAGNOSIS — R278 Other lack of coordination: Secondary | ICD-10-CM | POA: Diagnosis not present

## 2017-08-30 DIAGNOSIS — M6389 Disorders of muscle in diseases classified elsewhere, multiple sites: Secondary | ICD-10-CM | POA: Diagnosis not present

## 2017-08-30 DIAGNOSIS — R531 Weakness: Secondary | ICD-10-CM | POA: Diagnosis not present

## 2017-08-30 DIAGNOSIS — I4891 Unspecified atrial fibrillation: Secondary | ICD-10-CM | POA: Diagnosis not present

## 2017-08-30 DIAGNOSIS — R293 Abnormal posture: Secondary | ICD-10-CM | POA: Diagnosis not present

## 2017-08-30 DIAGNOSIS — R2681 Unsteadiness on feet: Secondary | ICD-10-CM | POA: Diagnosis not present

## 2017-08-31 DIAGNOSIS — R293 Abnormal posture: Secondary | ICD-10-CM | POA: Diagnosis not present

## 2017-08-31 DIAGNOSIS — I4891 Unspecified atrial fibrillation: Secondary | ICD-10-CM | POA: Diagnosis not present

## 2017-08-31 DIAGNOSIS — R278 Other lack of coordination: Secondary | ICD-10-CM | POA: Diagnosis not present

## 2017-08-31 DIAGNOSIS — R2681 Unsteadiness on feet: Secondary | ICD-10-CM | POA: Diagnosis not present

## 2017-08-31 DIAGNOSIS — M6389 Disorders of muscle in diseases classified elsewhere, multiple sites: Secondary | ICD-10-CM | POA: Diagnosis not present

## 2017-09-03 DIAGNOSIS — R2681 Unsteadiness on feet: Secondary | ICD-10-CM | POA: Diagnosis not present

## 2017-09-03 DIAGNOSIS — I4891 Unspecified atrial fibrillation: Secondary | ICD-10-CM | POA: Diagnosis not present

## 2017-09-03 DIAGNOSIS — R278 Other lack of coordination: Secondary | ICD-10-CM | POA: Diagnosis not present

## 2017-09-03 DIAGNOSIS — R293 Abnormal posture: Secondary | ICD-10-CM | POA: Diagnosis not present

## 2017-09-03 DIAGNOSIS — M6389 Disorders of muscle in diseases classified elsewhere, multiple sites: Secondary | ICD-10-CM | POA: Diagnosis not present

## 2017-09-04 DIAGNOSIS — S81801A Unspecified open wound, right lower leg, initial encounter: Secondary | ICD-10-CM | POA: Diagnosis not present

## 2017-09-05 DIAGNOSIS — M6389 Disorders of muscle in diseases classified elsewhere, multiple sites: Secondary | ICD-10-CM | POA: Diagnosis not present

## 2017-09-05 DIAGNOSIS — R278 Other lack of coordination: Secondary | ICD-10-CM | POA: Diagnosis not present

## 2017-09-05 DIAGNOSIS — I10 Essential (primary) hypertension: Secondary | ICD-10-CM | POA: Diagnosis not present

## 2017-09-05 DIAGNOSIS — R2681 Unsteadiness on feet: Secondary | ICD-10-CM | POA: Diagnosis not present

## 2017-09-05 DIAGNOSIS — I4891 Unspecified atrial fibrillation: Secondary | ICD-10-CM | POA: Diagnosis not present

## 2017-09-05 DIAGNOSIS — R293 Abnormal posture: Secondary | ICD-10-CM | POA: Diagnosis not present

## 2017-09-06 DIAGNOSIS — D692 Other nonthrombocytopenic purpura: Secondary | ICD-10-CM | POA: Diagnosis not present

## 2017-09-06 DIAGNOSIS — F0151 Vascular dementia with behavioral disturbance: Secondary | ICD-10-CM | POA: Diagnosis not present

## 2017-09-06 DIAGNOSIS — Z79899 Other long term (current) drug therapy: Secondary | ICD-10-CM | POA: Diagnosis not present

## 2017-09-06 DIAGNOSIS — F0150 Vascular dementia without behavioral disturbance: Secondary | ICD-10-CM | POA: Diagnosis not present

## 2017-09-06 DIAGNOSIS — D649 Anemia, unspecified: Secondary | ICD-10-CM | POA: Diagnosis not present

## 2017-09-06 DIAGNOSIS — E039 Hypothyroidism, unspecified: Secondary | ICD-10-CM | POA: Diagnosis not present

## 2017-09-07 DIAGNOSIS — R293 Abnormal posture: Secondary | ICD-10-CM | POA: Diagnosis not present

## 2017-09-07 DIAGNOSIS — R2681 Unsteadiness on feet: Secondary | ICD-10-CM | POA: Diagnosis not present

## 2017-09-07 DIAGNOSIS — R278 Other lack of coordination: Secondary | ICD-10-CM | POA: Diagnosis not present

## 2017-09-07 DIAGNOSIS — M6389 Disorders of muscle in diseases classified elsewhere, multiple sites: Secondary | ICD-10-CM | POA: Diagnosis not present

## 2017-09-07 DIAGNOSIS — I4891 Unspecified atrial fibrillation: Secondary | ICD-10-CM | POA: Diagnosis not present

## 2017-09-10 DIAGNOSIS — R293 Abnormal posture: Secondary | ICD-10-CM | POA: Diagnosis not present

## 2017-09-10 DIAGNOSIS — I4891 Unspecified atrial fibrillation: Secondary | ICD-10-CM | POA: Diagnosis not present

## 2017-09-10 DIAGNOSIS — R2681 Unsteadiness on feet: Secondary | ICD-10-CM | POA: Diagnosis not present

## 2017-09-10 DIAGNOSIS — M6389 Disorders of muscle in diseases classified elsewhere, multiple sites: Secondary | ICD-10-CM | POA: Diagnosis not present

## 2017-09-10 DIAGNOSIS — R278 Other lack of coordination: Secondary | ICD-10-CM | POA: Diagnosis not present

## 2017-09-12 DIAGNOSIS — R278 Other lack of coordination: Secondary | ICD-10-CM | POA: Diagnosis not present

## 2017-09-12 DIAGNOSIS — R293 Abnormal posture: Secondary | ICD-10-CM | POA: Diagnosis not present

## 2017-09-12 DIAGNOSIS — M6389 Disorders of muscle in diseases classified elsewhere, multiple sites: Secondary | ICD-10-CM | POA: Diagnosis not present

## 2017-09-12 DIAGNOSIS — R2681 Unsteadiness on feet: Secondary | ICD-10-CM | POA: Diagnosis not present

## 2017-09-12 DIAGNOSIS — I4891 Unspecified atrial fibrillation: Secondary | ICD-10-CM | POA: Diagnosis not present

## 2017-09-13 DIAGNOSIS — R2681 Unsteadiness on feet: Secondary | ICD-10-CM | POA: Diagnosis not present

## 2017-09-13 DIAGNOSIS — I4891 Unspecified atrial fibrillation: Secondary | ICD-10-CM | POA: Diagnosis not present

## 2017-09-13 DIAGNOSIS — R293 Abnormal posture: Secondary | ICD-10-CM | POA: Diagnosis not present

## 2017-09-13 DIAGNOSIS — M6389 Disorders of muscle in diseases classified elsewhere, multiple sites: Secondary | ICD-10-CM | POA: Diagnosis not present

## 2017-09-13 DIAGNOSIS — R278 Other lack of coordination: Secondary | ICD-10-CM | POA: Diagnosis not present

## 2017-09-14 ENCOUNTER — Other Ambulatory Visit: Payer: Self-pay | Admitting: Internal Medicine

## 2017-09-14 DIAGNOSIS — S81801A Unspecified open wound, right lower leg, initial encounter: Secondary | ICD-10-CM | POA: Diagnosis not present

## 2017-09-15 DIAGNOSIS — M6389 Disorders of muscle in diseases classified elsewhere, multiple sites: Secondary | ICD-10-CM | POA: Diagnosis not present

## 2017-09-15 DIAGNOSIS — R278 Other lack of coordination: Secondary | ICD-10-CM | POA: Diagnosis not present

## 2017-09-15 DIAGNOSIS — I4891 Unspecified atrial fibrillation: Secondary | ICD-10-CM | POA: Diagnosis not present

## 2017-09-15 DIAGNOSIS — R293 Abnormal posture: Secondary | ICD-10-CM | POA: Diagnosis not present

## 2017-09-15 DIAGNOSIS — R2681 Unsteadiness on feet: Secondary | ICD-10-CM | POA: Diagnosis not present

## 2017-09-18 DIAGNOSIS — R2681 Unsteadiness on feet: Secondary | ICD-10-CM | POA: Diagnosis not present

## 2017-09-18 DIAGNOSIS — M6389 Disorders of muscle in diseases classified elsewhere, multiple sites: Secondary | ICD-10-CM | POA: Diagnosis not present

## 2017-09-18 DIAGNOSIS — J849 Interstitial pulmonary disease, unspecified: Secondary | ICD-10-CM | POA: Diagnosis not present

## 2017-09-18 DIAGNOSIS — I4891 Unspecified atrial fibrillation: Secondary | ICD-10-CM | POA: Diagnosis not present

## 2017-09-20 DIAGNOSIS — M6389 Disorders of muscle in diseases classified elsewhere, multiple sites: Secondary | ICD-10-CM | POA: Diagnosis not present

## 2017-09-20 DIAGNOSIS — I4891 Unspecified atrial fibrillation: Secondary | ICD-10-CM | POA: Diagnosis not present

## 2017-09-20 DIAGNOSIS — J849 Interstitial pulmonary disease, unspecified: Secondary | ICD-10-CM | POA: Diagnosis not present

## 2017-09-20 DIAGNOSIS — R2681 Unsteadiness on feet: Secondary | ICD-10-CM | POA: Diagnosis not present

## 2017-09-21 DIAGNOSIS — R2681 Unsteadiness on feet: Secondary | ICD-10-CM | POA: Diagnosis not present

## 2017-09-21 DIAGNOSIS — J849 Interstitial pulmonary disease, unspecified: Secondary | ICD-10-CM | POA: Diagnosis not present

## 2017-09-21 DIAGNOSIS — M6389 Disorders of muscle in diseases classified elsewhere, multiple sites: Secondary | ICD-10-CM | POA: Diagnosis not present

## 2017-09-21 DIAGNOSIS — I4891 Unspecified atrial fibrillation: Secondary | ICD-10-CM | POA: Diagnosis not present

## 2017-09-24 DIAGNOSIS — L603 Nail dystrophy: Secondary | ICD-10-CM | POA: Diagnosis not present

## 2017-09-24 DIAGNOSIS — M6389 Disorders of muscle in diseases classified elsewhere, multiple sites: Secondary | ICD-10-CM | POA: Diagnosis not present

## 2017-09-24 DIAGNOSIS — J849 Interstitial pulmonary disease, unspecified: Secondary | ICD-10-CM | POA: Diagnosis not present

## 2017-09-24 DIAGNOSIS — I4891 Unspecified atrial fibrillation: Secondary | ICD-10-CM | POA: Diagnosis not present

## 2017-09-24 DIAGNOSIS — R2681 Unsteadiness on feet: Secondary | ICD-10-CM | POA: Diagnosis not present

## 2017-09-24 DIAGNOSIS — M79673 Pain in unspecified foot: Secondary | ICD-10-CM | POA: Diagnosis not present

## 2017-09-24 DIAGNOSIS — G300 Alzheimer's disease with early onset: Secondary | ICD-10-CM | POA: Diagnosis not present

## 2017-09-26 DIAGNOSIS — R2681 Unsteadiness on feet: Secondary | ICD-10-CM | POA: Diagnosis not present

## 2017-09-26 DIAGNOSIS — M6389 Disorders of muscle in diseases classified elsewhere, multiple sites: Secondary | ICD-10-CM | POA: Diagnosis not present

## 2017-09-26 DIAGNOSIS — J849 Interstitial pulmonary disease, unspecified: Secondary | ICD-10-CM | POA: Diagnosis not present

## 2017-09-26 DIAGNOSIS — I4891 Unspecified atrial fibrillation: Secondary | ICD-10-CM | POA: Diagnosis not present

## 2017-09-28 DIAGNOSIS — R2681 Unsteadiness on feet: Secondary | ICD-10-CM | POA: Diagnosis not present

## 2017-09-28 DIAGNOSIS — I4891 Unspecified atrial fibrillation: Secondary | ICD-10-CM | POA: Diagnosis not present

## 2017-09-28 DIAGNOSIS — M6389 Disorders of muscle in diseases classified elsewhere, multiple sites: Secondary | ICD-10-CM | POA: Diagnosis not present

## 2017-09-28 DIAGNOSIS — J849 Interstitial pulmonary disease, unspecified: Secondary | ICD-10-CM | POA: Diagnosis not present

## 2017-10-02 DIAGNOSIS — S81801A Unspecified open wound, right lower leg, initial encounter: Secondary | ICD-10-CM | POA: Diagnosis not present

## 2017-10-02 DIAGNOSIS — M6389 Disorders of muscle in diseases classified elsewhere, multiple sites: Secondary | ICD-10-CM | POA: Diagnosis not present

## 2017-10-02 DIAGNOSIS — R2681 Unsteadiness on feet: Secondary | ICD-10-CM | POA: Diagnosis not present

## 2017-10-02 DIAGNOSIS — I4891 Unspecified atrial fibrillation: Secondary | ICD-10-CM | POA: Diagnosis not present

## 2017-10-02 DIAGNOSIS — J849 Interstitial pulmonary disease, unspecified: Secondary | ICD-10-CM | POA: Diagnosis not present

## 2017-10-09 DIAGNOSIS — S81801A Unspecified open wound, right lower leg, initial encounter: Secondary | ICD-10-CM | POA: Diagnosis not present

## 2017-10-09 DIAGNOSIS — R531 Weakness: Secondary | ICD-10-CM | POA: Diagnosis not present

## 2017-10-10 DIAGNOSIS — N390 Urinary tract infection, site not specified: Secondary | ICD-10-CM | POA: Diagnosis not present

## 2017-10-10 DIAGNOSIS — Z79899 Other long term (current) drug therapy: Secondary | ICD-10-CM | POA: Diagnosis not present

## 2017-10-15 ENCOUNTER — Ambulatory Visit: Payer: TRICARE For Life (TFL) | Admitting: Podiatry

## 2017-10-16 DIAGNOSIS — S81801A Unspecified open wound, right lower leg, initial encounter: Secondary | ICD-10-CM | POA: Diagnosis not present

## 2017-10-30 DIAGNOSIS — R05 Cough: Secondary | ICD-10-CM | POA: Diagnosis not present

## 2017-10-31 DIAGNOSIS — R531 Weakness: Secondary | ICD-10-CM | POA: Diagnosis not present

## 2017-11-02 ENCOUNTER — Other Ambulatory Visit: Payer: Self-pay | Admitting: Internal Medicine

## 2017-11-06 DIAGNOSIS — S81802A Unspecified open wound, left lower leg, initial encounter: Secondary | ICD-10-CM | POA: Diagnosis not present

## 2017-11-06 DIAGNOSIS — S81802D Unspecified open wound, left lower leg, subsequent encounter: Secondary | ICD-10-CM | POA: Diagnosis not present

## 2017-11-07 ENCOUNTER — Telehealth: Payer: Self-pay | Admitting: Internal Medicine

## 2017-11-07 ENCOUNTER — Encounter: Payer: Medicare Other | Admitting: *Deleted

## 2017-11-07 ENCOUNTER — Telehealth: Payer: Self-pay | Admitting: Cardiology

## 2017-11-07 NOTE — Telephone Encounter (Signed)
New Message    1. Has your device fired? no  2. Is you device beeping? no  3. Are you experiencing draining or swelling at device site? no  4. Are you calling to see if we received your device transmission? no  5. Have you passed out? No   Patient spouse is calling because he is unable to send the transmission. Needs assistance on sending it. Please call to discuss.     Please route to [place]

## 2017-11-07 NOTE — Telephone Encounter (Signed)
Confirmed remote transmission w/ pt husband.   

## 2017-11-07 NOTE — Telephone Encounter (Signed)
Spoke with pt's husband he stated that pt is in a nursing home and doesn't have a land line there, informed him that we could send out a cellular adapter to his house and would eliminate the need for a land line, pt's husband asked if they could get the cell phone adapter on Monday pt has an in office visit with Dr. Rayann Heman. Informed him that I couldn't guarantee that we would have one since those come from the company but that I would check on it and if not then we would mail one out and explain how the cell adapter works pts husband voiced understanding

## 2017-11-08 ENCOUNTER — Other Ambulatory Visit: Payer: Self-pay | Admitting: Internal Medicine

## 2017-11-08 NOTE — Telephone Encounter (Signed)
Labs per [facility] (05/08/2017)

## 2017-11-12 ENCOUNTER — Ambulatory Visit (INDEPENDENT_AMBULATORY_CARE_PROVIDER_SITE_OTHER): Payer: Medicare Other | Admitting: Internal Medicine

## 2017-11-12 ENCOUNTER — Encounter: Payer: Self-pay | Admitting: Internal Medicine

## 2017-11-12 VITALS — BP 122/62 | HR 60 | Ht 63.0 in | Wt 129.0 lb

## 2017-11-12 DIAGNOSIS — I480 Paroxysmal atrial fibrillation: Secondary | ICD-10-CM

## 2017-11-12 DIAGNOSIS — Z950 Presence of cardiac pacemaker: Secondary | ICD-10-CM

## 2017-11-12 DIAGNOSIS — I10 Essential (primary) hypertension: Secondary | ICD-10-CM

## 2017-11-12 DIAGNOSIS — I495 Sick sinus syndrome: Secondary | ICD-10-CM

## 2017-11-12 LAB — CUP PACEART INCLINIC DEVICE CHECK
Date Time Interrogation Session: 20190225161910
Implantable Lead Implant Date: 20121016
Implantable Lead Implant Date: 20121016
Implantable Lead Location: 753860
Implantable Lead Model: 1948
Implantable Pulse Generator Implant Date: 20121016
MDC IDC LEAD LOCATION: 753859
Pulse Gen Model: 2210
Pulse Gen Serial Number: 7280874

## 2017-11-12 NOTE — Patient Instructions (Addendum)
Medication Instructions:  Your physician recommends that you continue on your current medications as directed. Please refer to the Current Medication list given to you today.  Labwork: You will get lab work today:  CBC, BMET, and magnesium.  Testing/Procedures: None ordered.  Follow-Up: Your physician wants you to follow-up in: one year with Chanetta Marshall, NP.   You will receive a reminder letter in the mail two months in advance. If you don't receive a letter, please call our office to schedule the follow-up appointment.  Remote monitoring is used to monitor your Pacemaker from home. This monitoring reduces the number of office visits required to check your device to one time per year. It allows [place] to keep an eye on the functioning of your device to ensure it is working properly. You are scheduled for a device check from home on 02/12/2018. You may send your transmission at any time that day. If you have a wireless device, the transmission will be sent automatically. After your physician reviews your transmission, you will receive a postcard with your next transmission date.  Any Other Special Instructions Will Be Listed Below (If Applicable).  If you need a refill on your cardiac medications before your next appointment, please call your pharmacy.

## 2017-11-12 NOTE — Progress Notes (Signed)
PCP: Marton Redwood, MD  Primary Cardiologist:  Dr Martinique Primary EP:  Dr Rayann Heman  [name] is a 84 y.o. female who presents today for routine electrophysiology followup.  Since last being seen in our clinic, the patient reports doing reasonably well. She has had gradual decline.  In a wheelchair today.  Today, she denies symptoms of palpitations, chest pain, shortness of breath, lower extremity edema, dizziness, presyncope, or syncope.  The patient is otherwise without complaint today.   Past Medical History:  Diagnosis Date  . (HFpEF) heart failure with preserved ejection fraction ([place])   . Arthritis   . Atrial fibrillation [facility])    Prior TEE/CV in May 2012; managed with rate control and anticoagulation  . Back pain    CHRONIC  . Dysphagia   . GERD (gastroesophageal reflux disease)   . Hiatal hernia   . Hypertension   . Hyponatremia    improved  . Osteopenia   . Pacemaker   . Pneumonia 04/2010  . Renal artery stenosis ([place]) 2008   STATUS POST ANGIOPLASTY  . Seizure disorder ([place])    on anti-epileptic therapy  . SIADH (syndrome of inappropriate ADH production) ([place])    h/o HYPONATREMIA ; LIKELY RELATED TO HER LUNG PROCESS  . Tachy-brady syndrome [facility])    with pauses noted in May 2012; s/p PTVP October 2012  . Urinary incontinence    Past Surgical History:  Procedure Laterality Date  . APPENDECTOMY    . BACK SURGERY     X2  . CARDIAC CATHETERIZATION  2007   Normal  . CARDIOVERSION    . CARDIOVERSION  10/14/2012   Procedure: CARDIOVERSION;  Surgeon: Thayer Headings, MD;  Location: [facility] ENDOSCOPY;  Service: Cardiovascular;  Laterality: N/A;  . CARDIOVERSION N/A 05/30/2013   Procedure: CARDIOVERSION;  Surgeon: Thompson Grayer, MD;  Location: [place];  Service: Cardiovascular;  Laterality: N/A;  . CARDIOVERSION N/A 03/03/2013   Procedure: CARDIOVERSION;  Surgeon: Deboraha Sprang, MD;  Location: [facility] CATH LAB;  Service: Cardiovascular;  Laterality: N/A;  . CATARACT EXTRACTION      . CATARACT EXTRACTION Bilateral   . CHOLECYSTECTOMY    . KNEE ARTHROSCOPY Left   . PACEMAKER INSERTION  06/2011   SJM Accent DR RF implanted by Dr Rayann Heman  . PARTIAL HYSTERECTOMY    . RENAL ANGIOPLASTY Right 01/2007  . SHOULDER SURGERY    . TONSILLECTOMY    . TOTAL KNEE ARTHROPLASTY Bilateral     ROS- all systems are reviewed and negative except as per HPI above  Current Outpatient Medications  Medication Sig Dispense Refill  . BENICAR 40 MG tablet TAKE 1 TABLET DAILY 90 tablet 0  . Cholecalciferol (VITAMIN D) 400 UNITS capsule Take 400 Units by mouth 3 (three) times daily.    . divalproex (DEPAKOTE) 500 MG EC tablet Take 500 mg by mouth at bedtime.      . dofetilide (TIKOSYN) 250 MCG capsule TAKE 1 CAPSULE TWICE A DAY 180 capsule 3  . dorzolamide-timolol (COSOPT) 22.3-6.8 MG/ML ophthalmic solution Put one drop in right eye twice a day (use as directed)  4  . furosemide (LASIX) 40 MG tablet TAKE 1 TABLET DAILY 90 tablet 3  . KLOR-CON M10 10 MEQ tablet TAKE 1 TABLET TWICE A DAY 180 tablet 3  . LATANOPROST OP Place 1 drop into the right eye at bedtime.    . metoprolol tartrate (LOPRESSOR) 25 MG tablet TAKE 1 TABLET TWICE A DAY 180 tablet 3  .  mirabegron ER (MYRBETRIQ) 50 MG TB24 tablet Take 50 mg by mouth daily.    . Multiple Vitamins-Minerals (MULTIVITAMINS THER. W/MINERALS) TABS Take 1 tablet by mouth daily.      . pantoprazole (PROTONIX) 40 MG tablet Take 1 tablet by mouth daily.    . polyethylene glycol powder (GLYCOLAX/MIRALAX) powder Take 17 g by mouth daily as needed. For constipation    . PRADAXA 75 MG CAPS capsule TAKE 1 CAPSULE EVERY 12 HOURS 180 capsule 1  . prednisoLONE acetate (PRED FORTE) 1 % ophthalmic suspension Place 1 drop into the left eye 2 (two) times daily.    . VESICARE 5 MG tablet Take 5 mg by mouth daily.     . Calcium Citrate (CITRACAL PO) Take 1 tablet by mouth 2 (two) times daily.     Marland Kitchen HYDROcodone-acetaminophen (VICODIN) 5-500 MG per tablet Take 1 tablet by  mouth every 6 (six) hours as needed. For pain      No current facility-administered medications for this visit.     Physical Exam: Vitals:   11/12/17 1435  BP: 122/62  Pulse: 60  Weight: 129 lb (58.5 kg)  Height: 5\' 3"  (1.6 m)    GEN- The patient is elderly and frail appearing, alert and oriented x 3 today.  In a wheelchair Head- normocephalic, atraumatic Eyes-  Sclera clear, conjunctiva pink Ears- hearing intact Oropharynx- clear Lungs- Clear to ausculation bilaterally, normal work of breathing Chest- pacemaker pocket is well healed Heart- Regular rate and rhythm,  GI- soft, NT, ND, + BS Extremities- no clubbing, cyanosis, + venous insufficiency with chronic edema  Pacemaker interrogation- reviewed in detail today,  See PACEART report  ekg tracing ordered today is personally reviewed and shows atrial paced rhythm, PR 306 msec, Qtc 450 msec  Assessment and Plan:  1. Symptomatic sinus bradycardia  Normal pacemaker function See Pace Art report No changes today  2. Persistent afib Doing well with tikosyn afib burden by PPM is 27 % (36% last visit)  CBC, Bmet, mg today On pradaxa 75mg  BID for chads2vasc score of at least 5.  3. HTN Stable No change required today  4. Chronic diastolic dysfunction Stable No change required today  Merlin Return to see EP NP in a year Follow-up with Dr Martinique as scheduled PCP to follow CBC, BMET, Mg at least twice per year  Thompson Grayer MD, [facility] 11/12/2017 2:54 PM

## 2017-11-13 DIAGNOSIS — S81802A Unspecified open wound, left lower leg, initial encounter: Secondary | ICD-10-CM | POA: Diagnosis not present

## 2017-11-13 DIAGNOSIS — S81802D Unspecified open wound, left lower leg, subsequent encounter: Secondary | ICD-10-CM | POA: Diagnosis not present

## 2017-11-13 LAB — CBC WITH DIFFERENTIAL/PLATELET
BASOS: 1 %
Basophils Absolute: 0 10*3/uL (ref 0.0–0.2)
EOS (ABSOLUTE): 0.1 10*3/uL (ref 0.0–0.4)
Eos: 1 %
Hematocrit: 38.1 % (ref 34.0–46.6)
Hemoglobin: 12.7 g/dL (ref 11.1–15.9)
IMMATURE GRANS (ABS): 0 10*3/uL (ref 0.0–0.1)
Immature Granulocytes: 0 %
Lymphocytes Absolute: 0.8 10*3/uL (ref 0.7–3.1)
Lymphs: 9 %
MCH: 30.5 pg (ref 26.6–33.0)
MCHC: 33.3 g/dL (ref 31.5–35.7)
MCV: 91 fL (ref 79–97)
MONOS ABS: 1 10*3/uL — AB (ref 0.1–0.9)
Monocytes: 11 %
NEUTROS ABS: 6.7 10*3/uL (ref 1.4–7.0)
Neutrophils: 78 %
PLATELETS: 299 10*3/uL (ref 150–379)
RBC: 4.17 x10E6/uL (ref 3.77–5.28)
RDW: 15 % (ref 12.3–15.4)
WBC: 8.6 10*3/uL (ref 3.4–10.8)

## 2017-11-13 LAB — BASIC METABOLIC PANEL
BUN / CREAT RATIO: 28 (ref 12–28)
BUN: 24 mg/dL (ref 10–36)
CHLORIDE: 93 mmol/L — AB (ref 96–106)
CO2: 29 mmol/L (ref 20–29)
Calcium: 9.5 mg/dL (ref 8.7–10.3)
Creatinine, Ser: 0.86 mg/dL (ref 0.57–1.00)
GFR calc non Af Amer: 60 mL/min/{1.73_m2} (ref >59)
GFR, EST AFRICAN AMERICAN: 69 mL/min/{1.73_m2} (ref >59)
Glucose: 118 mg/dL — ABNORMAL HIGH (ref 65–99)
Potassium: 4.3 mmol/L (ref 3.5–5.2)
Sodium: 139 mmol/L (ref 134–144)

## 2017-11-13 LAB — MAGNESIUM: Magnesium: 2.4 mg/dL — ABNORMAL HIGH (ref 1.6–2.3)

## 2017-11-14 DIAGNOSIS — F0390 Unspecified dementia without behavioral disturbance: Secondary | ICD-10-CM | POA: Diagnosis not present

## 2017-11-20 DIAGNOSIS — S81802D Unspecified open wound, left lower leg, subsequent encounter: Secondary | ICD-10-CM | POA: Diagnosis not present

## 2017-11-20 DIAGNOSIS — S81802A Unspecified open wound, left lower leg, initial encounter: Secondary | ICD-10-CM | POA: Diagnosis not present

## 2017-11-22 DIAGNOSIS — J849 Interstitial pulmonary disease, unspecified: Secondary | ICD-10-CM | POA: Diagnosis not present

## 2017-11-22 DIAGNOSIS — F0391 Unspecified dementia with behavioral disturbance: Secondary | ICD-10-CM | POA: Diagnosis not present

## 2017-11-22 DIAGNOSIS — R41841 Cognitive communication deficit: Secondary | ICD-10-CM | POA: Diagnosis not present

## 2017-11-22 DIAGNOSIS — I110 Hypertensive heart disease with heart failure: Secondary | ICD-10-CM | POA: Diagnosis not present

## 2017-11-22 DIAGNOSIS — I4891 Unspecified atrial fibrillation: Secondary | ICD-10-CM | POA: Diagnosis not present

## 2017-11-22 DIAGNOSIS — I5042 Chronic combined systolic (congestive) and diastolic (congestive) heart failure: Secondary | ICD-10-CM | POA: Diagnosis not present

## 2017-11-26 DIAGNOSIS — R41841 Cognitive communication deficit: Secondary | ICD-10-CM | POA: Diagnosis not present

## 2017-11-26 DIAGNOSIS — F0390 Unspecified dementia without behavioral disturbance: Secondary | ICD-10-CM | POA: Diagnosis not present

## 2017-11-26 DIAGNOSIS — I4891 Unspecified atrial fibrillation: Secondary | ICD-10-CM | POA: Diagnosis not present

## 2017-11-26 DIAGNOSIS — I110 Hypertensive heart disease with heart failure: Secondary | ICD-10-CM | POA: Diagnosis not present

## 2017-11-26 DIAGNOSIS — I5042 Chronic combined systolic (congestive) and diastolic (congestive) heart failure: Secondary | ICD-10-CM | POA: Diagnosis not present

## 2017-11-26 DIAGNOSIS — L609 Nail disorder, unspecified: Secondary | ICD-10-CM | POA: Diagnosis not present

## 2017-11-26 DIAGNOSIS — R2681 Unsteadiness on feet: Secondary | ICD-10-CM | POA: Diagnosis not present

## 2017-11-26 DIAGNOSIS — J849 Interstitial pulmonary disease, unspecified: Secondary | ICD-10-CM | POA: Diagnosis not present

## 2017-11-26 DIAGNOSIS — M79673 Pain in unspecified foot: Secondary | ICD-10-CM | POA: Diagnosis not present

## 2017-11-26 DIAGNOSIS — F0391 Unspecified dementia with behavioral disturbance: Secondary | ICD-10-CM | POA: Diagnosis not present

## 2017-11-27 DIAGNOSIS — I110 Hypertensive heart disease with heart failure: Secondary | ICD-10-CM | POA: Diagnosis not present

## 2017-11-27 DIAGNOSIS — I4891 Unspecified atrial fibrillation: Secondary | ICD-10-CM | POA: Diagnosis not present

## 2017-11-27 DIAGNOSIS — I5042 Chronic combined systolic (congestive) and diastolic (congestive) heart failure: Secondary | ICD-10-CM | POA: Diagnosis not present

## 2017-11-27 DIAGNOSIS — J849 Interstitial pulmonary disease, unspecified: Secondary | ICD-10-CM | POA: Diagnosis not present

## 2017-11-27 DIAGNOSIS — R41841 Cognitive communication deficit: Secondary | ICD-10-CM | POA: Diagnosis not present

## 2017-11-27 DIAGNOSIS — F0391 Unspecified dementia with behavioral disturbance: Secondary | ICD-10-CM | POA: Diagnosis not present

## 2017-11-28 DIAGNOSIS — F0391 Unspecified dementia with behavioral disturbance: Secondary | ICD-10-CM | POA: Diagnosis not present

## 2017-11-30 DIAGNOSIS — R41841 Cognitive communication deficit: Secondary | ICD-10-CM | POA: Diagnosis not present

## 2017-11-30 DIAGNOSIS — I110 Hypertensive heart disease with heart failure: Secondary | ICD-10-CM | POA: Diagnosis not present

## 2017-11-30 DIAGNOSIS — I4891 Unspecified atrial fibrillation: Secondary | ICD-10-CM | POA: Diagnosis not present

## 2017-11-30 DIAGNOSIS — J849 Interstitial pulmonary disease, unspecified: Secondary | ICD-10-CM | POA: Diagnosis not present

## 2017-11-30 DIAGNOSIS — I5042 Chronic combined systolic (congestive) and diastolic (congestive) heart failure: Secondary | ICD-10-CM | POA: Diagnosis not present

## 2017-11-30 DIAGNOSIS — F0391 Unspecified dementia with behavioral disturbance: Secondary | ICD-10-CM | POA: Diagnosis not present

## 2017-12-03 DIAGNOSIS — I5042 Chronic combined systolic (congestive) and diastolic (congestive) heart failure: Secondary | ICD-10-CM | POA: Diagnosis not present

## 2017-12-03 DIAGNOSIS — J849 Interstitial pulmonary disease, unspecified: Secondary | ICD-10-CM | POA: Diagnosis not present

## 2017-12-03 DIAGNOSIS — I4891 Unspecified atrial fibrillation: Secondary | ICD-10-CM | POA: Diagnosis not present

## 2017-12-03 DIAGNOSIS — R41841 Cognitive communication deficit: Secondary | ICD-10-CM | POA: Diagnosis not present

## 2017-12-03 DIAGNOSIS — F0391 Unspecified dementia with behavioral disturbance: Secondary | ICD-10-CM | POA: Diagnosis not present

## 2017-12-03 DIAGNOSIS — I110 Hypertensive heart disease with heart failure: Secondary | ICD-10-CM | POA: Diagnosis not present

## 2017-12-05 DIAGNOSIS — L98491 Non-pressure chronic ulcer of skin of other sites limited to breakdown of skin: Secondary | ICD-10-CM | POA: Diagnosis not present

## 2017-12-05 DIAGNOSIS — I110 Hypertensive heart disease with heart failure: Secondary | ICD-10-CM | POA: Diagnosis not present

## 2017-12-05 DIAGNOSIS — R41841 Cognitive communication deficit: Secondary | ICD-10-CM | POA: Diagnosis not present

## 2017-12-05 DIAGNOSIS — J849 Interstitial pulmonary disease, unspecified: Secondary | ICD-10-CM | POA: Diagnosis not present

## 2017-12-05 DIAGNOSIS — F0391 Unspecified dementia with behavioral disturbance: Secondary | ICD-10-CM | POA: Diagnosis not present

## 2017-12-05 DIAGNOSIS — I5042 Chronic combined systolic (congestive) and diastolic (congestive) heart failure: Secondary | ICD-10-CM | POA: Diagnosis not present

## 2017-12-05 DIAGNOSIS — I4891 Unspecified atrial fibrillation: Secondary | ICD-10-CM | POA: Diagnosis not present

## 2017-12-06 DIAGNOSIS — I110 Hypertensive heart disease with heart failure: Secondary | ICD-10-CM | POA: Diagnosis not present

## 2017-12-06 DIAGNOSIS — I4891 Unspecified atrial fibrillation: Secondary | ICD-10-CM | POA: Diagnosis not present

## 2017-12-06 DIAGNOSIS — I5042 Chronic combined systolic (congestive) and diastolic (congestive) heart failure: Secondary | ICD-10-CM | POA: Diagnosis not present

## 2017-12-06 DIAGNOSIS — R41841 Cognitive communication deficit: Secondary | ICD-10-CM | POA: Diagnosis not present

## 2017-12-06 DIAGNOSIS — F0391 Unspecified dementia with behavioral disturbance: Secondary | ICD-10-CM | POA: Diagnosis not present

## 2017-12-06 DIAGNOSIS — J849 Interstitial pulmonary disease, unspecified: Secondary | ICD-10-CM | POA: Diagnosis not present

## 2017-12-07 DIAGNOSIS — J849 Interstitial pulmonary disease, unspecified: Secondary | ICD-10-CM | POA: Diagnosis not present

## 2017-12-07 DIAGNOSIS — I110 Hypertensive heart disease with heart failure: Secondary | ICD-10-CM | POA: Diagnosis not present

## 2017-12-07 DIAGNOSIS — I4891 Unspecified atrial fibrillation: Secondary | ICD-10-CM | POA: Diagnosis not present

## 2017-12-07 DIAGNOSIS — F0391 Unspecified dementia with behavioral disturbance: Secondary | ICD-10-CM | POA: Diagnosis not present

## 2017-12-07 DIAGNOSIS — I5042 Chronic combined systolic (congestive) and diastolic (congestive) heart failure: Secondary | ICD-10-CM | POA: Diagnosis not present

## 2017-12-07 DIAGNOSIS — R41841 Cognitive communication deficit: Secondary | ICD-10-CM | POA: Diagnosis not present

## 2017-12-11 DIAGNOSIS — R41841 Cognitive communication deficit: Secondary | ICD-10-CM | POA: Diagnosis not present

## 2017-12-11 DIAGNOSIS — I110 Hypertensive heart disease with heart failure: Secondary | ICD-10-CM | POA: Diagnosis not present

## 2017-12-11 DIAGNOSIS — I4891 Unspecified atrial fibrillation: Secondary | ICD-10-CM | POA: Diagnosis not present

## 2017-12-11 DIAGNOSIS — J849 Interstitial pulmonary disease, unspecified: Secondary | ICD-10-CM | POA: Diagnosis not present

## 2017-12-11 DIAGNOSIS — I5042 Chronic combined systolic (congestive) and diastolic (congestive) heart failure: Secondary | ICD-10-CM | POA: Diagnosis not present

## 2017-12-11 DIAGNOSIS — F0391 Unspecified dementia with behavioral disturbance: Secondary | ICD-10-CM | POA: Diagnosis not present

## 2017-12-12 DIAGNOSIS — I4891 Unspecified atrial fibrillation: Secondary | ICD-10-CM | POA: Diagnosis not present

## 2017-12-12 DIAGNOSIS — I5042 Chronic combined systolic (congestive) and diastolic (congestive) heart failure: Secondary | ICD-10-CM | POA: Diagnosis not present

## 2017-12-12 DIAGNOSIS — J849 Interstitial pulmonary disease, unspecified: Secondary | ICD-10-CM | POA: Diagnosis not present

## 2017-12-12 DIAGNOSIS — R41841 Cognitive communication deficit: Secondary | ICD-10-CM | POA: Diagnosis not present

## 2017-12-12 DIAGNOSIS — I110 Hypertensive heart disease with heart failure: Secondary | ICD-10-CM | POA: Diagnosis not present

## 2017-12-12 DIAGNOSIS — F0391 Unspecified dementia with behavioral disturbance: Secondary | ICD-10-CM | POA: Diagnosis not present

## 2017-12-13 DIAGNOSIS — R41841 Cognitive communication deficit: Secondary | ICD-10-CM | POA: Diagnosis not present

## 2017-12-13 DIAGNOSIS — I110 Hypertensive heart disease with heart failure: Secondary | ICD-10-CM | POA: Diagnosis not present

## 2017-12-13 DIAGNOSIS — J849 Interstitial pulmonary disease, unspecified: Secondary | ICD-10-CM | POA: Diagnosis not present

## 2017-12-13 DIAGNOSIS — F0391 Unspecified dementia with behavioral disturbance: Secondary | ICD-10-CM | POA: Diagnosis not present

## 2017-12-13 DIAGNOSIS — I4891 Unspecified atrial fibrillation: Secondary | ICD-10-CM | POA: Diagnosis not present

## 2017-12-13 DIAGNOSIS — I5042 Chronic combined systolic (congestive) and diastolic (congestive) heart failure: Secondary | ICD-10-CM | POA: Diagnosis not present

## 2017-12-15 DIAGNOSIS — J849 Interstitial pulmonary disease, unspecified: Secondary | ICD-10-CM | POA: Diagnosis not present

## 2017-12-15 DIAGNOSIS — I110 Hypertensive heart disease with heart failure: Secondary | ICD-10-CM | POA: Diagnosis not present

## 2017-12-15 DIAGNOSIS — R41841 Cognitive communication deficit: Secondary | ICD-10-CM | POA: Diagnosis not present

## 2017-12-15 DIAGNOSIS — I4891 Unspecified atrial fibrillation: Secondary | ICD-10-CM | POA: Diagnosis not present

## 2017-12-15 DIAGNOSIS — I5042 Chronic combined systolic (congestive) and diastolic (congestive) heart failure: Secondary | ICD-10-CM | POA: Diagnosis not present

## 2017-12-15 DIAGNOSIS — F0391 Unspecified dementia with behavioral disturbance: Secondary | ICD-10-CM | POA: Diagnosis not present

## 2017-12-17 DIAGNOSIS — I10 Essential (primary) hypertension: Secondary | ICD-10-CM | POA: Diagnosis not present

## 2017-12-17 DIAGNOSIS — Z79899 Other long term (current) drug therapy: Secondary | ICD-10-CM | POA: Diagnosis not present

## 2017-12-17 DIAGNOSIS — E708 Other disorders of aromatic amino-acid metabolism: Secondary | ICD-10-CM | POA: Diagnosis not present

## 2017-12-17 DIAGNOSIS — R569 Unspecified convulsions: Secondary | ICD-10-CM | POA: Diagnosis not present

## 2017-12-17 NOTE — Progress Notes (Signed)
[name] Date of Birth: 06-05-1925 Medical Record #[id number]  History of Present Illness: [name] is seen back today for follow up of CHF and sick sinus syndrome.  She has chronic diastolic CHF, tachy brady with PPM in place, past cardioversions in October of 2012 and again in January of 2014. Recurrent atrial fib in June 2014 and was cardioverted and started on Multaq. Has been intolerant to amiodarone due to possible pulmonary toxicity. Then failed on Multaq due to recurrent arrhythmia. She has been  on Tikosyn.  On follow up today she is now living in assisted living at [facility]. Her husband reports her memory has declined. She really hasn't been aware of any recurrent AFib. She does have chronic leg swelling. Has legs wrapped weekly with ACE bandage.  No increase SOB. Activity is very limited. No chest pain or syncope.      Current Outpatient Medications  Medication Sig Dispense Refill  . BENICAR 40 MG tablet TAKE 1 TABLET DAILY 90 tablet 0  . Calcium Citrate (CITRACAL PO) Take 1 tablet by mouth 2 (two) times daily.     . Cholecalciferol (VITAMIN D) 400 UNITS capsule Take 400 Units by mouth 3 (three) times daily.    . divalproex (DEPAKOTE) 500 MG EC tablet Take 500 mg by mouth at bedtime.      . dofetilide (TIKOSYN) 250 MCG capsule TAKE 1 CAPSULE TWICE A DAY 180 capsule 3  . dorzolamide-timolol (COSOPT) 22.3-6.8 MG/ML ophthalmic solution Put one drop in right eye twice a day (use as directed)  4  . furosemide (LASIX) 40 MG tablet TAKE 1 TABLET DAILY 90 tablet 3  . HYDROcodone-acetaminophen (VICODIN) 5-500 MG per tablet Take 1 tablet by mouth every 6 (six) hours as needed. For pain     . KLOR-CON M10 10 MEQ tablet TAKE 1 TABLET TWICE A DAY 180 tablet 3  . LATANOPROST OP Place 1 drop into the right eye at bedtime.    . metoprolol tartrate (LOPRESSOR) 25 MG tablet TAKE 1 TABLET TWICE A DAY 180 tablet 3  . mirabegron ER (MYRBETRIQ) 50 MG TB24 tablet Take 50 mg by mouth daily.    .  Multiple Vitamins-Minerals (MULTIVITAMINS THER. W/MINERALS) TABS Take 1 tablet by mouth daily.      . pantoprazole (PROTONIX) 40 MG tablet Take 1 tablet by mouth daily.    . polyethylene glycol powder (GLYCOLAX/MIRALAX) powder Take 17 g by mouth daily as needed. For constipation    . PRADAXA 75 MG CAPS capsule TAKE 1 CAPSULE EVERY 12 HOURS 180 capsule 1  . prednisoLONE acetate (PRED FORTE) 1 % ophthalmic suspension Place 1 drop into the left eye 2 (two) times daily.    . VESICARE 5 MG tablet Take 5 mg by mouth daily.      No current facility-administered medications for this visit.     Allergies  Allergen Reactions  . Amiodarone     Possibly caused pneumonitis    Past Medical History:  Diagnosis Date  . (HFpEF) heart failure with preserved ejection fraction ([place])   . Arthritis   . Atrial fibrillation [facility])    Prior TEE/CV in May 2012; managed with rate control and anticoagulation  . Back pain    CHRONIC  . Dysphagia   . GERD (gastroesophageal reflux disease)   . Hiatal hernia   . Hypertension   . Hyponatremia    improved  . Osteopenia   . Pacemaker   . Pneumonia 04/2010  . Renal artery stenosis ([place])  2008   STATUS POST ANGIOPLASTY  . Seizure disorder ([place])    on anti-epileptic therapy  . SIADH (syndrome of inappropriate ADH production) ([place])    h/o HYPONATREMIA ; LIKELY RELATED TO HER LUNG PROCESS  . Tachy-brady syndrome [facility])    with pauses noted in May 2012; s/p PTVP October 2012  . Urinary incontinence     Past Surgical History:  Procedure Laterality Date  . APPENDECTOMY    . BACK SURGERY     X2  . CARDIAC CATHETERIZATION  2007   Normal  . CARDIOVERSION    . CARDIOVERSION  10/14/2012   Procedure: CARDIOVERSION;  Surgeon: Thayer Headings, MD;  Location: [facility] ENDOSCOPY;  Service: Cardiovascular;  Laterality: N/A;  . CARDIOVERSION N/A 05/30/2013   Procedure: CARDIOVERSION;  Surgeon: Thompson Grayer, MD;  Location: [place];  Service: Cardiovascular;  Laterality: N/A;  .  CARDIOVERSION N/A 03/03/2013   Procedure: CARDIOVERSION;  Surgeon: Deboraha Sprang, MD;  Location: [facility] CATH LAB;  Service: Cardiovascular;  Laterality: N/A;  . CATARACT EXTRACTION    . CATARACT EXTRACTION Bilateral   . CHOLECYSTECTOMY    . KNEE ARTHROSCOPY Left   . PACEMAKER INSERTION  06/2011   SJM Accent DR RF implanted by Dr Rayann Heman  . PARTIAL HYSTERECTOMY    . RENAL ANGIOPLASTY Right 01/2007  . SHOULDER SURGERY    . TONSILLECTOMY    . TOTAL KNEE ARTHROPLASTY Bilateral     Social History   Tobacco Use  Smoking Status Never Smoker  Smokeless Tobacco Never Used    Social History   Substance and Sexual Activity  Alcohol Use No    Family History  Problem Relation Age of Onset  . Stroke Mother   . Diabetes Mother   . Heart disease Mother        Unkown  . Stroke Father   . Diabetes Father   . Prostate cancer Father   . Kidney failure Brother   . Lung disease Brother   . Lung disease Sister   . Hypertension Daughter   . Hypertension Son   . Bladder Cancer Brother     Review of Systems: The review of systems is per the HPI.  All other systems were reviewed and are negative.  Physical Exam: BP (!) 147/72   Pulse 62   Ht 5\' 3"  (1.6 m)   Wt 127 lb 12.8 oz (58 kg)   BMI 22.64 kg/m  GENERAL:  Elderly WF in NAD. Seen in wheelchair HEENT:  PERRL, EOMI, sclera are clear. Oropharynx is clear. NECK:  No jugular venous distention, carotid upstroke brisk and symmetric, no bruits, no thyromegaly or adenopathy LUNGS:  Bilateral dry crackles L>R CHEST:  Unremarkable HEART:  RRR,  PMI not displaced or sustained,S1 and S2 within normal limits, no S3, no S4: no clicks, no rubs, no murmurs ABD:  Soft, nontender. BS +, no masses or bruits. No hepatomegaly, no splenomegaly EXT:  2 + pulses throughout, 1+ edema, ACE wraps to mid shin. no cyanosis no clubbing. Limited range of motion right shoulder.  SKIN:  Warm and dry. Hyperpigmentation in LE.  NEURO:  Alert and oriented x 3. Cranial  nerves II through XII intact. PSYCH:  Cognitively intact    LABORATORY DATA:  Lab Results  Component Value Date   WBC 8.6 11/12/2017   HGB 12.7 11/12/2017   HCT 38.1 11/12/2017   PLT 299 11/12/2017   GLUCOSE 118 (H) 11/12/2017   ALT 26 11/23/2011   AST 31 11/23/2011  NA 139 11/12/2017   K 4.3 11/12/2017   CL 93 (L) 11/12/2017   CREATININE 0.86 11/12/2017   BUN 24 11/12/2017   CO2 29 11/12/2017   TSH 1.25 05/08/2013   INR 1.69 (H) 03/02/2013   Labs dated 11/10/16: normal BMET and magnesium level.  Assessment / Plan: 1. PAF - improved control on Tikosyn -  Recent pacemaker check showed continued improvement in degree of AFib since on Tikosyn. Burden of 27%.  Rate is controlled. Continue Tikosyn and metoprolol. On Pradaxa for anticoagulation.  2. Tachy/brady - with PPM in place - followed by Dr. Rayann Heman  3. HTN - well controlled today.  4. Chronic diastolic HF - some LE edema. Continue leg wraps and lasix. Keep feet elevated when possible. Needs to limit salt intake more.   5. Rotator cuff dysfunction with pain.  Chronic. Poor surgical candidate.   6. Memory loss. Now in assisted living.  I will follow up in 6 months.

## 2017-12-18 ENCOUNTER — Ambulatory Visit (INDEPENDENT_AMBULATORY_CARE_PROVIDER_SITE_OTHER): Payer: Medicare Other | Admitting: Cardiology

## 2017-12-18 ENCOUNTER — Encounter: Payer: Self-pay | Admitting: Cardiology

## 2017-12-18 VITALS — BP 147/72 | HR 62 | Ht 63.0 in | Wt 127.8 lb

## 2017-12-18 DIAGNOSIS — I480 Paroxysmal atrial fibrillation: Secondary | ICD-10-CM

## 2017-12-18 DIAGNOSIS — I5032 Chronic diastolic (congestive) heart failure: Secondary | ICD-10-CM | POA: Diagnosis not present

## 2017-12-18 DIAGNOSIS — Z950 Presence of cardiac pacemaker: Secondary | ICD-10-CM | POA: Diagnosis not present

## 2017-12-18 DIAGNOSIS — I495 Sick sinus syndrome: Secondary | ICD-10-CM

## 2017-12-18 NOTE — Patient Instructions (Signed)
Continue your current therapy  Do not add any salt to your food.  I will see you in 6 months.

## 2017-12-19 DIAGNOSIS — I110 Hypertensive heart disease with heart failure: Secondary | ICD-10-CM | POA: Diagnosis not present

## 2017-12-19 DIAGNOSIS — J849 Interstitial pulmonary disease, unspecified: Secondary | ICD-10-CM | POA: Diagnosis not present

## 2017-12-19 DIAGNOSIS — R41841 Cognitive communication deficit: Secondary | ICD-10-CM | POA: Diagnosis not present

## 2017-12-19 DIAGNOSIS — I5042 Chronic combined systolic (congestive) and diastolic (congestive) heart failure: Secondary | ICD-10-CM | POA: Diagnosis not present

## 2017-12-19 DIAGNOSIS — I4891 Unspecified atrial fibrillation: Secondary | ICD-10-CM | POA: Diagnosis not present

## 2017-12-19 DIAGNOSIS — F0391 Unspecified dementia with behavioral disturbance: Secondary | ICD-10-CM | POA: Diagnosis not present

## 2017-12-26 DIAGNOSIS — F0391 Unspecified dementia with behavioral disturbance: Secondary | ICD-10-CM | POA: Diagnosis not present

## 2017-12-26 DIAGNOSIS — I5042 Chronic combined systolic (congestive) and diastolic (congestive) heart failure: Secondary | ICD-10-CM | POA: Diagnosis not present

## 2017-12-26 DIAGNOSIS — F419 Anxiety disorder, unspecified: Secondary | ICD-10-CM | POA: Diagnosis not present

## 2017-12-26 DIAGNOSIS — I110 Hypertensive heart disease with heart failure: Secondary | ICD-10-CM | POA: Diagnosis not present

## 2017-12-26 DIAGNOSIS — F39 Unspecified mood [affective] disorder: Secondary | ICD-10-CM | POA: Diagnosis not present

## 2017-12-26 DIAGNOSIS — R41841 Cognitive communication deficit: Secondary | ICD-10-CM | POA: Diagnosis not present

## 2017-12-26 DIAGNOSIS — I4891 Unspecified atrial fibrillation: Secondary | ICD-10-CM | POA: Diagnosis not present

## 2017-12-26 DIAGNOSIS — J849 Interstitial pulmonary disease, unspecified: Secondary | ICD-10-CM | POA: Diagnosis not present

## 2017-12-30 DIAGNOSIS — L97509 Non-pressure chronic ulcer of other part of unspecified foot with unspecified severity: Secondary | ICD-10-CM | POA: Diagnosis not present

## 2018-01-01 ENCOUNTER — Telehealth: Payer: Self-pay | Admitting: Cardiology

## 2018-01-01 DIAGNOSIS — S81801D Unspecified open wound, right lower leg, subsequent encounter: Secondary | ICD-10-CM | POA: Diagnosis not present

## 2018-01-01 NOTE — Telephone Encounter (Signed)
Returned call to patient's husband concerning patient of Dr. Martinique last seen on 4/2 - she is a resident at [facility]. Patient has swelling in legs. Husband states she has damaged her legs - she has bruised her legs, her skin is so tender/thin that the least little scrape causes her skin to break and heals poorly. She is having her legs wrapped with bandages but when these are removed, she bleeds badly. Husband states her weight is checked periodically. Husband states she is good at elevating her legs but her diet is limited by [facility] facility  Patient's nurse at [facility] is Caryl Asp and she called in to make an appointment for patient - scheduled for PV eval with Dr. Fletcher Anon d/t abnormal doppler (moved from 5/14 to 5/7 per spouse, would like her seen sooner). Husband states he does not want her to lose her leg.  Routed to primary cardiologist for any recommendations on condition & Lattie Haw RN for sooner appt with Dr. Fletcher Anon if there are cancellations

## 2018-01-01 NOTE — Telephone Encounter (Signed)
NEW MESSAGE    Patient spouse calling with concerns about his wife's legs.    Pt c/o swelling: STAT is pt has developed SOB within 24 hours  1) How much weight have you gained and in what time span? n/a  2) If swelling, where is the swelling located?  legs  3) Are you currently taking a fluid pill? yes  4) Are you currently SOB? no  5) Do you have a log of your daily weights (if so, list)? n/a  6) Have you gained 3 pounds in a day or 5 pounds in a week? n/a  7) Have you traveled recently? no

## 2018-01-01 NOTE — Telephone Encounter (Signed)
I don't know that I have anything to add. Would continue leg wraps to minimize swelling. Keep appt with Dr. Fletcher Anon.   Peter Martinique MD, [facility]

## 2018-01-01 NOTE — Telephone Encounter (Signed)
Returned call to patient's husband.Dr.Jordan's recommendation given.

## 2018-01-02 DIAGNOSIS — I110 Hypertensive heart disease with heart failure: Secondary | ICD-10-CM | POA: Diagnosis not present

## 2018-01-02 DIAGNOSIS — R41841 Cognitive communication deficit: Secondary | ICD-10-CM | POA: Diagnosis not present

## 2018-01-02 DIAGNOSIS — I5042 Chronic combined systolic (congestive) and diastolic (congestive) heart failure: Secondary | ICD-10-CM | POA: Diagnosis not present

## 2018-01-02 DIAGNOSIS — I4891 Unspecified atrial fibrillation: Secondary | ICD-10-CM | POA: Diagnosis not present

## 2018-01-02 DIAGNOSIS — J849 Interstitial pulmonary disease, unspecified: Secondary | ICD-10-CM | POA: Diagnosis not present

## 2018-01-02 DIAGNOSIS — F0391 Unspecified dementia with behavioral disturbance: Secondary | ICD-10-CM | POA: Diagnosis not present

## 2018-01-07 DIAGNOSIS — R41841 Cognitive communication deficit: Secondary | ICD-10-CM | POA: Diagnosis not present

## 2018-01-07 DIAGNOSIS — I4891 Unspecified atrial fibrillation: Secondary | ICD-10-CM | POA: Diagnosis not present

## 2018-01-07 DIAGNOSIS — I110 Hypertensive heart disease with heart failure: Secondary | ICD-10-CM | POA: Diagnosis not present

## 2018-01-07 DIAGNOSIS — F0391 Unspecified dementia with behavioral disturbance: Secondary | ICD-10-CM | POA: Diagnosis not present

## 2018-01-07 DIAGNOSIS — J849 Interstitial pulmonary disease, unspecified: Secondary | ICD-10-CM | POA: Diagnosis not present

## 2018-01-07 DIAGNOSIS — I5042 Chronic combined systolic (congestive) and diastolic (congestive) heart failure: Secondary | ICD-10-CM | POA: Diagnosis not present

## 2018-01-09 DIAGNOSIS — F0391 Unspecified dementia with behavioral disturbance: Secondary | ICD-10-CM | POA: Diagnosis not present

## 2018-01-10 DIAGNOSIS — R41841 Cognitive communication deficit: Secondary | ICD-10-CM | POA: Diagnosis not present

## 2018-01-10 DIAGNOSIS — F0391 Unspecified dementia with behavioral disturbance: Secondary | ICD-10-CM | POA: Diagnosis not present

## 2018-01-10 DIAGNOSIS — I4891 Unspecified atrial fibrillation: Secondary | ICD-10-CM | POA: Diagnosis not present

## 2018-01-10 DIAGNOSIS — I5042 Chronic combined systolic (congestive) and diastolic (congestive) heart failure: Secondary | ICD-10-CM | POA: Diagnosis not present

## 2018-01-10 DIAGNOSIS — I110 Hypertensive heart disease with heart failure: Secondary | ICD-10-CM | POA: Diagnosis not present

## 2018-01-10 DIAGNOSIS — J849 Interstitial pulmonary disease, unspecified: Secondary | ICD-10-CM | POA: Diagnosis not present

## 2018-01-14 DIAGNOSIS — I110 Hypertensive heart disease with heart failure: Secondary | ICD-10-CM | POA: Diagnosis not present

## 2018-01-14 DIAGNOSIS — R41841 Cognitive communication deficit: Secondary | ICD-10-CM | POA: Diagnosis not present

## 2018-01-14 DIAGNOSIS — I5042 Chronic combined systolic (congestive) and diastolic (congestive) heart failure: Secondary | ICD-10-CM | POA: Diagnosis not present

## 2018-01-14 DIAGNOSIS — J849 Interstitial pulmonary disease, unspecified: Secondary | ICD-10-CM | POA: Diagnosis not present

## 2018-01-14 DIAGNOSIS — I4891 Unspecified atrial fibrillation: Secondary | ICD-10-CM | POA: Diagnosis not present

## 2018-01-14 DIAGNOSIS — F0391 Unspecified dementia with behavioral disturbance: Secondary | ICD-10-CM | POA: Diagnosis not present

## 2018-01-17 DIAGNOSIS — F0391 Unspecified dementia with behavioral disturbance: Secondary | ICD-10-CM | POA: Diagnosis not present

## 2018-01-17 DIAGNOSIS — I4891 Unspecified atrial fibrillation: Secondary | ICD-10-CM | POA: Diagnosis not present

## 2018-01-17 DIAGNOSIS — R41841 Cognitive communication deficit: Secondary | ICD-10-CM | POA: Diagnosis not present

## 2018-01-17 DIAGNOSIS — I110 Hypertensive heart disease with heart failure: Secondary | ICD-10-CM | POA: Diagnosis not present

## 2018-01-17 DIAGNOSIS — I5042 Chronic combined systolic (congestive) and diastolic (congestive) heart failure: Secondary | ICD-10-CM | POA: Diagnosis not present

## 2018-01-17 DIAGNOSIS — J849 Interstitial pulmonary disease, unspecified: Secondary | ICD-10-CM | POA: Diagnosis not present

## 2018-01-21 DIAGNOSIS — I872 Venous insufficiency (chronic) (peripheral): Secondary | ICD-10-CM | POA: Diagnosis not present

## 2018-01-21 DIAGNOSIS — L97911 Non-pressure chronic ulcer of unspecified part of right lower leg limited to breakdown of skin: Secondary | ICD-10-CM | POA: Diagnosis not present

## 2018-01-21 DIAGNOSIS — I739 Peripheral vascular disease, unspecified: Secondary | ICD-10-CM | POA: Diagnosis not present

## 2018-01-21 DIAGNOSIS — F0391 Unspecified dementia with behavioral disturbance: Secondary | ICD-10-CM | POA: Diagnosis not present

## 2018-01-21 DIAGNOSIS — I110 Hypertensive heart disease with heart failure: Secondary | ICD-10-CM | POA: Diagnosis not present

## 2018-01-21 DIAGNOSIS — I5042 Chronic combined systolic (congestive) and diastolic (congestive) heart failure: Secondary | ICD-10-CM | POA: Diagnosis not present

## 2018-01-22 ENCOUNTER — Ambulatory Visit (INDEPENDENT_AMBULATORY_CARE_PROVIDER_SITE_OTHER): Payer: Medicare Other | Admitting: Cardiovascular Disease

## 2018-01-22 ENCOUNTER — Encounter: Payer: Self-pay | Admitting: Cardiovascular Disease

## 2018-01-22 VITALS — BP 119/72 | HR 60 | Ht 63.0 in | Wt 122.4 lb

## 2018-01-22 DIAGNOSIS — I5022 Chronic systolic (congestive) heart failure: Secondary | ICD-10-CM

## 2018-01-22 DIAGNOSIS — I872 Venous insufficiency (chronic) (peripheral): Secondary | ICD-10-CM

## 2018-01-22 DIAGNOSIS — I481 Persistent atrial fibrillation: Secondary | ICD-10-CM

## 2018-01-22 DIAGNOSIS — I4819 Other persistent atrial fibrillation: Secondary | ICD-10-CM

## 2018-01-22 NOTE — Patient Instructions (Signed)
Medication Instructions: Your physician recommends that you continue on your current medications as directed. Please refer to the Current Medication list given to you today.  If you need a refill on your cardiac medications before your next appointment, please call your pharmacy.    Follow-Up: Your physician wants you to follow-up as needed with Dr. Arida.   Thank you for choosing [facility]!!      

## 2018-01-22 NOTE — Progress Notes (Signed)
Cardiology Office Note   Date:  01/22/2018   ID:  [name], DOB [birth year], MRN [id number]  PCP:  Jacqueline Redwood, MD  Cardiologist:   Dr. Martinique  Chief Complaint  Patient presents with  . Follow-up    doppler      History of Present Illness: [name] is a 84 y.o. female who was referred for evaluation of lower extremity ulceration and possible peripheral arterial disease.  She is a resident of [facility] assisted living facility.  She has known history of chronic systolic heart failure, sick sinus syndrome status post pacemaker placement and persistent atrial fibrillation maintaining in sinus rhythm with Tikosyn. She also has dementia with recent decline. She developed ulcerations on the front of both legs above the ankles and below the knees over the last few months.  There is some oozing.  She did have significant leg edema that improved recently.  She underwent portable lower extremity arterial duplex that showed no significant disease bilaterally. Her mobility is limited overall.  She reports no chest pain or shortness of breath.   Past Medical History:  Diagnosis Date  . (HFpEF) heart failure with preserved ejection fraction ([place])   . Arthritis   . Atrial fibrillation [facility])    Prior TEE/CV in May 2012; managed with rate control and anticoagulation  . Back pain    CHRONIC  . Dysphagia   . GERD (gastroesophageal reflux disease)   . Hiatal hernia   . Hypertension   . Hyponatremia    improved  . Osteopenia   . Pacemaker   . Pneumonia 04/2010  . Renal artery stenosis ([place]) 2008   STATUS POST ANGIOPLASTY  . Seizure disorder ([place])    on anti-epileptic therapy  . SIADH (syndrome of inappropriate ADH production) ([place])    h/o HYPONATREMIA ; LIKELY RELATED TO HER LUNG PROCESS  . Tachy-brady syndrome [facility])    with pauses noted in May 2012; s/p PTVP October 2012  . Urinary incontinence     Past Surgical History:  Procedure Laterality Date  . APPENDECTOMY    .  BACK SURGERY     X2  . CARDIAC CATHETERIZATION  2007   Normal  . CARDIOVERSION    . CARDIOVERSION  10/14/2012   Procedure: CARDIOVERSION;  Surgeon: Thayer Headings, MD;  Location: [facility] ENDOSCOPY;  Service: Cardiovascular;  Laterality: N/A;  . CARDIOVERSION N/A 05/30/2013   Procedure: CARDIOVERSION;  Surgeon: Thompson Grayer, MD;  Location: [place];  Service: Cardiovascular;  Laterality: N/A;  . CARDIOVERSION N/A 03/03/2013   Procedure: CARDIOVERSION;  Surgeon: Deboraha Sprang, MD;  Location: [facility] CATH LAB;  Service: Cardiovascular;  Laterality: N/A;  . CATARACT EXTRACTION    . CATARACT EXTRACTION Bilateral   . CHOLECYSTECTOMY    . KNEE ARTHROSCOPY Left   . PACEMAKER INSERTION  06/2011   SJM Accent DR RF implanted by Dr Rayann Heman  . PARTIAL HYSTERECTOMY    . RENAL ANGIOPLASTY Right 01/2007  . SHOULDER SURGERY    . TONSILLECTOMY    . TOTAL KNEE ARTHROPLASTY Bilateral      Current Outpatient Medications  Medication Sig Dispense Refill  . BENICAR 40 MG tablet TAKE 1 TABLET DAILY 90 tablet 0  . Calcium Citrate (CITRACAL PO) Take 1 tablet by mouth 2 (two) times daily.     . Cholecalciferol (VITAMIN D) 400 UNITS capsule Take 400 Units by mouth 3 (three) times daily.    . divalproex (DEPAKOTE) 500 MG EC tablet Take 500 mg by  mouth at bedtime.      . dofetilide (TIKOSYN) 250 MCG capsule TAKE 1 CAPSULE TWICE A DAY 180 capsule 3  . dorzolamide-timolol (COSOPT) 22.3-6.8 MG/ML ophthalmic solution Put one drop in right eye twice a day (use as directed)  4  . furosemide (LASIX) 40 MG tablet TAKE 1 TABLET DAILY 90 tablet 3  . HYDROcodone-acetaminophen (VICODIN) 5-500 MG per tablet Take 1 tablet by mouth every 6 (six) hours as needed. For pain     . KLOR-CON M10 10 MEQ tablet TAKE 1 TABLET TWICE A DAY 180 tablet 3  . LATANOPROST OP Place 1 drop into the right eye at bedtime.    . metoprolol tartrate (LOPRESSOR) 25 MG tablet TAKE 1 TABLET TWICE A DAY 180 tablet 3  . mirabegron ER (MYRBETRIQ) 50 MG TB24 tablet  Take 50 mg by mouth daily.    . Multiple Vitamins-Minerals (MULTIVITAMINS THER. W/MINERALS) TABS Take 1 tablet by mouth daily.      . pantoprazole (PROTONIX) 40 MG tablet Take 1 tablet by mouth daily.    . polyethylene glycol powder (GLYCOLAX/MIRALAX) powder Take 17 g by mouth daily as needed. For constipation    . PRADAXA 75 MG CAPS capsule TAKE 1 CAPSULE EVERY 12 HOURS 180 capsule 1  . prednisoLONE acetate (PRED FORTE) 1 % ophthalmic suspension Place 1 drop into the left eye 2 (two) times daily.    . VESICARE 5 MG tablet Take 5 mg by mouth daily.      No current facility-administered medications for this visit.     Allergies:   Amiodarone    Social History:  The patient  reports that she has never smoked. She has never used smokeless tobacco. She reports that she does not drink alcohol or use drugs.   Family History:  The patient's family history includes Bladder Cancer in her brother; Diabetes in her father and mother; Heart disease in her mother; Hypertension in her daughter and son; Kidney failure in her brother; Lung disease in her brother and sister; Prostate cancer in her father; Stroke in her father and mother.    ROS:  Please see the history of present illness.   Otherwise, review of systems are positive for none.   All other systems are reviewed and negative.    PHYSICAL EXAM: VS:  BP 119/72   Pulse 60   Ht 5\' 3"  (1.6 m)   Wt 122 lb 6.4 oz (55.5 kg)   BMI 21.68 kg/m  , BMI Body mass index is 21.68 kg/m. GEN: Well nourished, well developed, in no acute distress  HEENT: normal  Neck: no JVD, carotid bruits, or masses Cardiac: RRR; no murmurs, rubs, or gallops, trace bilateral leg edema with chronic stasis Respiratory:  clear to auscultation bilaterally, normal work of breathing GI: soft, nontender, nondistended, + BS MS: no deformity or atrophy  Skin: warm and dry, no rash Neuro:  Strength and sensation are intact Psych: euthymic mood, full affect Vascular: Dorsalis  pedis is palpable bilaterally.  EKG:  EKG is not ordered today.    Recent Labs: 11/12/2017: BUN 24; Creatinine, Ser 0.86; Hemoglobin 12.7; Magnesium 2.4; Platelets 299; Potassium 4.3; Sodium 139    Lipid Panel No results found for: CHOL, TRIG, HDL, CHOLHDL, VLDL, LDLCALC, LDLDIRECT    Wt Readings from Last 3 Encounters:  01/22/18 122 lb 6.4 oz (55.5 kg)  12/18/17 127 lb 12.8 oz (58 kg)  11/12/17 129 lb (58.5 kg)      No flowsheet data found.  ASSESSMENT AND PLAN:  1.  Chronic venous insufficiency with ulceration.  I do not see evidence of peripheral arterial disease as her dorsalis pedis pulses palpable bilaterally and recent duplex showed no significant obstructive disease. The ulceration is consistent with venous insufficiency.  I recommend continuing leg elevation during the day at least 3 times and compressive wrapping therapy.  Referral to the wound center can be considered if needed.  2.  Atrial fibrillation: Maintaining in sinus rhythm with Tikosyn.  3.  Chronic systolic heart failure: She did have worsening leg edema recently but that has improved and currently there is minimal edema.    Disposition:   FU with me as needed  Signed,  Kathlyn Sacramento, MD  01/22/2018 11:22 AM    [place]

## 2018-01-24 DIAGNOSIS — I110 Hypertensive heart disease with heart failure: Secondary | ICD-10-CM | POA: Diagnosis not present

## 2018-01-24 DIAGNOSIS — I739 Peripheral vascular disease, unspecified: Secondary | ICD-10-CM | POA: Diagnosis not present

## 2018-01-24 DIAGNOSIS — I5042 Chronic combined systolic (congestive) and diastolic (congestive) heart failure: Secondary | ICD-10-CM | POA: Diagnosis not present

## 2018-01-24 DIAGNOSIS — I872 Venous insufficiency (chronic) (peripheral): Secondary | ICD-10-CM | POA: Diagnosis not present

## 2018-01-24 DIAGNOSIS — F0391 Unspecified dementia with behavioral disturbance: Secondary | ICD-10-CM | POA: Diagnosis not present

## 2018-01-24 DIAGNOSIS — L97911 Non-pressure chronic ulcer of unspecified part of right lower leg limited to breakdown of skin: Secondary | ICD-10-CM | POA: Diagnosis not present

## 2018-01-28 DIAGNOSIS — F0390 Unspecified dementia without behavioral disturbance: Secondary | ICD-10-CM | POA: Diagnosis not present

## 2018-01-28 DIAGNOSIS — I110 Hypertensive heart disease with heart failure: Secondary | ICD-10-CM | POA: Diagnosis not present

## 2018-01-28 DIAGNOSIS — I5042 Chronic combined systolic (congestive) and diastolic (congestive) heart failure: Secondary | ICD-10-CM | POA: Diagnosis not present

## 2018-01-28 DIAGNOSIS — I739 Peripheral vascular disease, unspecified: Secondary | ICD-10-CM | POA: Diagnosis not present

## 2018-01-28 DIAGNOSIS — L97911 Non-pressure chronic ulcer of unspecified part of right lower leg limited to breakdown of skin: Secondary | ICD-10-CM | POA: Diagnosis not present

## 2018-01-28 DIAGNOSIS — L603 Nail dystrophy: Secondary | ICD-10-CM | POA: Diagnosis not present

## 2018-01-28 DIAGNOSIS — R2681 Unsteadiness on feet: Secondary | ICD-10-CM | POA: Diagnosis not present

## 2018-01-28 DIAGNOSIS — L851 Acquired keratosis [keratoderma] palmaris et plantaris: Secondary | ICD-10-CM | POA: Diagnosis not present

## 2018-01-28 DIAGNOSIS — M79673 Pain in unspecified foot: Secondary | ICD-10-CM | POA: Diagnosis not present

## 2018-01-28 DIAGNOSIS — F0391 Unspecified dementia with behavioral disturbance: Secondary | ICD-10-CM | POA: Diagnosis not present

## 2018-01-28 DIAGNOSIS — I872 Venous insufficiency (chronic) (peripheral): Secondary | ICD-10-CM | POA: Diagnosis not present

## 2018-01-29 ENCOUNTER — Ambulatory Visit: Payer: Medicare Other | Admitting: Cardiovascular Disease

## 2018-01-30 DIAGNOSIS — G309 Alzheimer's disease, unspecified: Secondary | ICD-10-CM | POA: Diagnosis not present

## 2018-01-30 DIAGNOSIS — F39 Unspecified mood [affective] disorder: Secondary | ICD-10-CM | POA: Diagnosis not present

## 2018-01-30 DIAGNOSIS — F0391 Unspecified dementia with behavioral disturbance: Secondary | ICD-10-CM | POA: Diagnosis not present

## 2018-01-30 DIAGNOSIS — F419 Anxiety disorder, unspecified: Secondary | ICD-10-CM | POA: Diagnosis not present

## 2018-01-31 DIAGNOSIS — Z79899 Other long term (current) drug therapy: Secondary | ICD-10-CM | POA: Diagnosis not present

## 2018-01-31 DIAGNOSIS — F0150 Vascular dementia without behavioral disturbance: Secondary | ICD-10-CM | POA: Diagnosis not present

## 2018-01-31 DIAGNOSIS — I10 Essential (primary) hypertension: Secondary | ICD-10-CM | POA: Diagnosis not present

## 2018-01-31 DIAGNOSIS — E559 Vitamin D deficiency, unspecified: Secondary | ICD-10-CM | POA: Diagnosis not present

## 2018-01-31 DIAGNOSIS — F0151 Vascular dementia with behavioral disturbance: Secondary | ICD-10-CM | POA: Diagnosis not present

## 2018-02-01 ENCOUNTER — Other Ambulatory Visit (HOSPITAL_COMMUNITY): Payer: Self-pay

## 2018-02-04 ENCOUNTER — Ambulatory Visit (HOSPITAL_COMMUNITY)
Admission: RE | Admit: 2018-02-04 | Discharge: 2018-02-04 | Disposition: A | Discharge status: Home / Self Care | Payer: Medicare Other | Source: Ambulatory Visit | Attending: Family Medicine | Admitting: Family Medicine

## 2018-02-04 DIAGNOSIS — M810 Age-related osteoporosis without current pathological fracture: Secondary | ICD-10-CM | POA: Diagnosis not present

## 2018-02-04 DIAGNOSIS — I872 Venous insufficiency (chronic) (peripheral): Secondary | ICD-10-CM | POA: Diagnosis not present

## 2018-02-04 DIAGNOSIS — I110 Hypertensive heart disease with heart failure: Secondary | ICD-10-CM | POA: Diagnosis not present

## 2018-02-04 DIAGNOSIS — I5042 Chronic combined systolic (congestive) and diastolic (congestive) heart failure: Secondary | ICD-10-CM | POA: Diagnosis not present

## 2018-02-04 DIAGNOSIS — L97911 Non-pressure chronic ulcer of unspecified part of right lower leg limited to breakdown of skin: Secondary | ICD-10-CM | POA: Diagnosis not present

## 2018-02-04 DIAGNOSIS — I739 Peripheral vascular disease, unspecified: Secondary | ICD-10-CM | POA: Diagnosis not present

## 2018-02-04 DIAGNOSIS — F0391 Unspecified dementia with behavioral disturbance: Secondary | ICD-10-CM | POA: Diagnosis not present

## 2018-02-04 MED ORDER — DENOSUMAB 60 MG/ML ~~LOC~~ SOSY
60.0000 mg | PREFILLED_SYRINGE | Freq: Once | SUBCUTANEOUS | Status: AC
  Administered 2018-02-04: 60 mg via SUBCUTANEOUS
  Filled 2018-02-04: qty 1

## 2018-02-07 DIAGNOSIS — I110 Hypertensive heart disease with heart failure: Secondary | ICD-10-CM | POA: Diagnosis not present

## 2018-02-07 DIAGNOSIS — I739 Peripheral vascular disease, unspecified: Secondary | ICD-10-CM | POA: Diagnosis not present

## 2018-02-07 DIAGNOSIS — F0391 Unspecified dementia with behavioral disturbance: Secondary | ICD-10-CM | POA: Diagnosis not present

## 2018-02-07 DIAGNOSIS — L97911 Non-pressure chronic ulcer of unspecified part of right lower leg limited to breakdown of skin: Secondary | ICD-10-CM | POA: Diagnosis not present

## 2018-02-07 DIAGNOSIS — I872 Venous insufficiency (chronic) (peripheral): Secondary | ICD-10-CM | POA: Diagnosis not present

## 2018-02-07 DIAGNOSIS — I5042 Chronic combined systolic (congestive) and diastolic (congestive) heart failure: Secondary | ICD-10-CM | POA: Diagnosis not present

## 2018-02-12 ENCOUNTER — Ambulatory Visit (INDEPENDENT_AMBULATORY_CARE_PROVIDER_SITE_OTHER): Payer: Medicare Other | Admitting: *Deleted

## 2018-02-12 DIAGNOSIS — I5042 Chronic combined systolic (congestive) and diastolic (congestive) heart failure: Secondary | ICD-10-CM | POA: Diagnosis not present

## 2018-02-12 DIAGNOSIS — I872 Venous insufficiency (chronic) (peripheral): Secondary | ICD-10-CM | POA: Diagnosis not present

## 2018-02-12 DIAGNOSIS — F0391 Unspecified dementia with behavioral disturbance: Secondary | ICD-10-CM | POA: Diagnosis not present

## 2018-02-12 DIAGNOSIS — I495 Sick sinus syndrome: Secondary | ICD-10-CM

## 2018-02-12 DIAGNOSIS — I110 Hypertensive heart disease with heart failure: Secondary | ICD-10-CM | POA: Diagnosis not present

## 2018-02-12 DIAGNOSIS — I739 Peripheral vascular disease, unspecified: Secondary | ICD-10-CM | POA: Diagnosis not present

## 2018-02-12 DIAGNOSIS — L97911 Non-pressure chronic ulcer of unspecified part of right lower leg limited to breakdown of skin: Secondary | ICD-10-CM | POA: Diagnosis not present

## 2018-02-12 NOTE — Progress Notes (Signed)
Remote pacemaker transmission.   

## 2018-02-14 DIAGNOSIS — L97911 Non-pressure chronic ulcer of unspecified part of right lower leg limited to breakdown of skin: Secondary | ICD-10-CM | POA: Diagnosis not present

## 2018-02-14 DIAGNOSIS — I739 Peripheral vascular disease, unspecified: Secondary | ICD-10-CM | POA: Diagnosis not present

## 2018-02-14 DIAGNOSIS — F0391 Unspecified dementia with behavioral disturbance: Secondary | ICD-10-CM | POA: Diagnosis not present

## 2018-02-14 DIAGNOSIS — I872 Venous insufficiency (chronic) (peripheral): Secondary | ICD-10-CM | POA: Diagnosis not present

## 2018-02-14 DIAGNOSIS — I110 Hypertensive heart disease with heart failure: Secondary | ICD-10-CM | POA: Diagnosis not present

## 2018-02-14 DIAGNOSIS — I5042 Chronic combined systolic (congestive) and diastolic (congestive) heart failure: Secondary | ICD-10-CM | POA: Diagnosis not present

## 2018-02-15 ENCOUNTER — Encounter: Payer: Self-pay | Admitting: Cardiology

## 2018-02-18 DIAGNOSIS — I5042 Chronic combined systolic (congestive) and diastolic (congestive) heart failure: Secondary | ICD-10-CM | POA: Diagnosis not present

## 2018-02-18 DIAGNOSIS — I110 Hypertensive heart disease with heart failure: Secondary | ICD-10-CM | POA: Diagnosis not present

## 2018-02-18 DIAGNOSIS — I739 Peripheral vascular disease, unspecified: Secondary | ICD-10-CM | POA: Diagnosis not present

## 2018-02-18 DIAGNOSIS — L97911 Non-pressure chronic ulcer of unspecified part of right lower leg limited to breakdown of skin: Secondary | ICD-10-CM | POA: Diagnosis not present

## 2018-02-18 DIAGNOSIS — I872 Venous insufficiency (chronic) (peripheral): Secondary | ICD-10-CM | POA: Diagnosis not present

## 2018-02-18 DIAGNOSIS — F0391 Unspecified dementia with behavioral disturbance: Secondary | ICD-10-CM | POA: Diagnosis not present

## 2018-02-20 DIAGNOSIS — I872 Venous insufficiency (chronic) (peripheral): Secondary | ICD-10-CM | POA: Diagnosis not present

## 2018-02-20 DIAGNOSIS — L97911 Non-pressure chronic ulcer of unspecified part of right lower leg limited to breakdown of skin: Secondary | ICD-10-CM | POA: Diagnosis not present

## 2018-02-20 DIAGNOSIS — I739 Peripheral vascular disease, unspecified: Secondary | ICD-10-CM | POA: Diagnosis not present

## 2018-02-20 DIAGNOSIS — F0391 Unspecified dementia with behavioral disturbance: Secondary | ICD-10-CM | POA: Diagnosis not present

## 2018-02-20 DIAGNOSIS — I5042 Chronic combined systolic (congestive) and diastolic (congestive) heart failure: Secondary | ICD-10-CM | POA: Diagnosis not present

## 2018-02-20 DIAGNOSIS — I110 Hypertensive heart disease with heart failure: Secondary | ICD-10-CM | POA: Diagnosis not present

## 2018-02-21 DIAGNOSIS — L97911 Non-pressure chronic ulcer of unspecified part of right lower leg limited to breakdown of skin: Secondary | ICD-10-CM | POA: Diagnosis not present

## 2018-02-21 DIAGNOSIS — I739 Peripheral vascular disease, unspecified: Secondary | ICD-10-CM | POA: Diagnosis not present

## 2018-02-21 DIAGNOSIS — I110 Hypertensive heart disease with heart failure: Secondary | ICD-10-CM | POA: Diagnosis not present

## 2018-02-21 DIAGNOSIS — I5042 Chronic combined systolic (congestive) and diastolic (congestive) heart failure: Secondary | ICD-10-CM | POA: Diagnosis not present

## 2018-02-21 DIAGNOSIS — I872 Venous insufficiency (chronic) (peripheral): Secondary | ICD-10-CM | POA: Diagnosis not present

## 2018-02-21 DIAGNOSIS — F0391 Unspecified dementia with behavioral disturbance: Secondary | ICD-10-CM | POA: Diagnosis not present

## 2018-02-22 DIAGNOSIS — L97911 Non-pressure chronic ulcer of unspecified part of right lower leg limited to breakdown of skin: Secondary | ICD-10-CM | POA: Diagnosis not present

## 2018-02-22 DIAGNOSIS — I872 Venous insufficiency (chronic) (peripheral): Secondary | ICD-10-CM | POA: Diagnosis not present

## 2018-02-22 DIAGNOSIS — I739 Peripheral vascular disease, unspecified: Secondary | ICD-10-CM | POA: Diagnosis not present

## 2018-02-22 DIAGNOSIS — I110 Hypertensive heart disease with heart failure: Secondary | ICD-10-CM | POA: Diagnosis not present

## 2018-02-22 DIAGNOSIS — I5042 Chronic combined systolic (congestive) and diastolic (congestive) heart failure: Secondary | ICD-10-CM | POA: Diagnosis not present

## 2018-02-22 DIAGNOSIS — F0391 Unspecified dementia with behavioral disturbance: Secondary | ICD-10-CM | POA: Diagnosis not present

## 2018-02-25 DIAGNOSIS — I739 Peripheral vascular disease, unspecified: Secondary | ICD-10-CM | POA: Diagnosis not present

## 2018-02-25 DIAGNOSIS — I872 Venous insufficiency (chronic) (peripheral): Secondary | ICD-10-CM | POA: Diagnosis not present

## 2018-02-25 DIAGNOSIS — I110 Hypertensive heart disease with heart failure: Secondary | ICD-10-CM | POA: Diagnosis not present

## 2018-02-25 DIAGNOSIS — I5042 Chronic combined systolic (congestive) and diastolic (congestive) heart failure: Secondary | ICD-10-CM | POA: Diagnosis not present

## 2018-02-25 DIAGNOSIS — L97911 Non-pressure chronic ulcer of unspecified part of right lower leg limited to breakdown of skin: Secondary | ICD-10-CM | POA: Diagnosis not present

## 2018-02-25 DIAGNOSIS — F0391 Unspecified dementia with behavioral disturbance: Secondary | ICD-10-CM | POA: Diagnosis not present

## 2018-02-27 DIAGNOSIS — I739 Peripheral vascular disease, unspecified: Secondary | ICD-10-CM | POA: Diagnosis not present

## 2018-02-27 DIAGNOSIS — I5042 Chronic combined systolic (congestive) and diastolic (congestive) heart failure: Secondary | ICD-10-CM | POA: Diagnosis not present

## 2018-02-27 DIAGNOSIS — I872 Venous insufficiency (chronic) (peripheral): Secondary | ICD-10-CM | POA: Diagnosis not present

## 2018-02-27 DIAGNOSIS — I110 Hypertensive heart disease with heart failure: Secondary | ICD-10-CM | POA: Diagnosis not present

## 2018-02-27 DIAGNOSIS — F0391 Unspecified dementia with behavioral disturbance: Secondary | ICD-10-CM | POA: Diagnosis not present

## 2018-02-27 DIAGNOSIS — L97911 Non-pressure chronic ulcer of unspecified part of right lower leg limited to breakdown of skin: Secondary | ICD-10-CM | POA: Diagnosis not present

## 2018-02-28 LAB — CUP PACEART REMOTE DEVICE CHECK
Battery Remaining Longevity: 49 mo
Battery Remaining Percentage: 46 %
Battery Voltage: 2.86 V
Brady Statistic AP VP Percent: 34 %
Brady Statistic RA Percent Paced: 87 %
Brady Statistic RV Percent Paced: 34 %
Date Time Interrogation Session: 20190528060007
Implantable Lead Implant Date: 20121016
Implantable Lead Implant Date: 20121016
Implantable Lead Location: 753859
Implantable Lead Location: 753860
Implantable Lead Model: 1948
Implantable Pulse Generator Implant Date: 20121016
Lead Channel Impedance Value: 410 Ohm
Lead Channel Pacing Threshold Amplitude: 0.5 V
Lead Channel Sensing Intrinsic Amplitude: 0.6 mV
Lead Channel Sensing Intrinsic Amplitude: 12 mV
Lead Channel Setting Pacing Amplitude: 2 V
Lead Channel Setting Pacing Pulse Width: 0.5 ms
MDC IDC MSMT LEADCHNL RA PACING THRESHOLD AMPLITUDE: 1 V
MDC IDC MSMT LEADCHNL RA PACING THRESHOLD PULSEWIDTH: 0.5 ms
MDC IDC MSMT LEADCHNL RV IMPEDANCE VALUE: 660 Ohm
MDC IDC MSMT LEADCHNL RV PACING THRESHOLD PULSEWIDTH: 0.5 ms
MDC IDC SET LEADCHNL RV PACING AMPLITUDE: 2.5 V
MDC IDC SET LEADCHNL RV SENSING SENSITIVITY: 2 mV
MDC IDC STAT BRADY AP VS PERCENT: 64 %
MDC IDC STAT BRADY AS VP PERCENT: 1 %
MDC IDC STAT BRADY AS VS PERCENT: 1.3 %
Pulse Gen Model: 2210
Pulse Gen Serial Number: 7280874

## 2018-03-04 DIAGNOSIS — L97911 Non-pressure chronic ulcer of unspecified part of right lower leg limited to breakdown of skin: Secondary | ICD-10-CM | POA: Diagnosis not present

## 2018-03-04 DIAGNOSIS — I5042 Chronic combined systolic (congestive) and diastolic (congestive) heart failure: Secondary | ICD-10-CM | POA: Diagnosis not present

## 2018-03-04 DIAGNOSIS — F0391 Unspecified dementia with behavioral disturbance: Secondary | ICD-10-CM | POA: Diagnosis not present

## 2018-03-04 DIAGNOSIS — I110 Hypertensive heart disease with heart failure: Secondary | ICD-10-CM | POA: Diagnosis not present

## 2018-03-04 DIAGNOSIS — I872 Venous insufficiency (chronic) (peripheral): Secondary | ICD-10-CM | POA: Diagnosis not present

## 2018-03-04 DIAGNOSIS — I739 Peripheral vascular disease, unspecified: Secondary | ICD-10-CM | POA: Diagnosis not present

## 2018-03-06 DIAGNOSIS — L97911 Non-pressure chronic ulcer of unspecified part of right lower leg limited to breakdown of skin: Secondary | ICD-10-CM | POA: Diagnosis not present

## 2018-03-06 DIAGNOSIS — I110 Hypertensive heart disease with heart failure: Secondary | ICD-10-CM | POA: Diagnosis not present

## 2018-03-06 DIAGNOSIS — I739 Peripheral vascular disease, unspecified: Secondary | ICD-10-CM | POA: Diagnosis not present

## 2018-03-06 DIAGNOSIS — F0391 Unspecified dementia with behavioral disturbance: Secondary | ICD-10-CM | POA: Diagnosis not present

## 2018-03-06 DIAGNOSIS — I5042 Chronic combined systolic (congestive) and diastolic (congestive) heart failure: Secondary | ICD-10-CM | POA: Diagnosis not present

## 2018-03-06 DIAGNOSIS — I872 Venous insufficiency (chronic) (peripheral): Secondary | ICD-10-CM | POA: Diagnosis not present

## 2018-03-06 DIAGNOSIS — F329 Major depressive disorder, single episode, unspecified: Secondary | ICD-10-CM | POA: Diagnosis not present

## 2018-03-11 DIAGNOSIS — I110 Hypertensive heart disease with heart failure: Secondary | ICD-10-CM | POA: Diagnosis not present

## 2018-03-11 DIAGNOSIS — I872 Venous insufficiency (chronic) (peripheral): Secondary | ICD-10-CM | POA: Diagnosis not present

## 2018-03-11 DIAGNOSIS — F0391 Unspecified dementia with behavioral disturbance: Secondary | ICD-10-CM | POA: Diagnosis not present

## 2018-03-11 DIAGNOSIS — I5042 Chronic combined systolic (congestive) and diastolic (congestive) heart failure: Secondary | ICD-10-CM | POA: Diagnosis not present

## 2018-03-11 DIAGNOSIS — L97911 Non-pressure chronic ulcer of unspecified part of right lower leg limited to breakdown of skin: Secondary | ICD-10-CM | POA: Diagnosis not present

## 2018-03-11 DIAGNOSIS — I739 Peripheral vascular disease, unspecified: Secondary | ICD-10-CM | POA: Diagnosis not present

## 2018-03-13 DIAGNOSIS — G309 Alzheimer's disease, unspecified: Secondary | ICD-10-CM | POA: Diagnosis not present

## 2018-03-13 DIAGNOSIS — F0391 Unspecified dementia with behavioral disturbance: Secondary | ICD-10-CM | POA: Diagnosis not present

## 2018-03-15 DIAGNOSIS — I872 Venous insufficiency (chronic) (peripheral): Secondary | ICD-10-CM | POA: Diagnosis not present

## 2018-03-15 DIAGNOSIS — L97911 Non-pressure chronic ulcer of unspecified part of right lower leg limited to breakdown of skin: Secondary | ICD-10-CM | POA: Diagnosis not present

## 2018-03-15 DIAGNOSIS — I110 Hypertensive heart disease with heart failure: Secondary | ICD-10-CM | POA: Diagnosis not present

## 2018-03-15 DIAGNOSIS — I739 Peripheral vascular disease, unspecified: Secondary | ICD-10-CM | POA: Diagnosis not present

## 2018-03-15 DIAGNOSIS — I5042 Chronic combined systolic (congestive) and diastolic (congestive) heart failure: Secondary | ICD-10-CM | POA: Diagnosis not present

## 2018-03-15 DIAGNOSIS — F0391 Unspecified dementia with behavioral disturbance: Secondary | ICD-10-CM | POA: Diagnosis not present

## 2018-03-18 DIAGNOSIS — I739 Peripheral vascular disease, unspecified: Secondary | ICD-10-CM | POA: Diagnosis not present

## 2018-03-18 DIAGNOSIS — L97911 Non-pressure chronic ulcer of unspecified part of right lower leg limited to breakdown of skin: Secondary | ICD-10-CM | POA: Diagnosis not present

## 2018-03-18 DIAGNOSIS — I872 Venous insufficiency (chronic) (peripheral): Secondary | ICD-10-CM | POA: Diagnosis not present

## 2018-03-18 DIAGNOSIS — I5042 Chronic combined systolic (congestive) and diastolic (congestive) heart failure: Secondary | ICD-10-CM | POA: Diagnosis not present

## 2018-03-18 DIAGNOSIS — I110 Hypertensive heart disease with heart failure: Secondary | ICD-10-CM | POA: Diagnosis not present

## 2018-03-18 DIAGNOSIS — F0391 Unspecified dementia with behavioral disturbance: Secondary | ICD-10-CM | POA: Diagnosis not present

## 2018-03-19 DIAGNOSIS — F0391 Unspecified dementia with behavioral disturbance: Secondary | ICD-10-CM | POA: Diagnosis not present

## 2018-03-19 DIAGNOSIS — I5042 Chronic combined systolic (congestive) and diastolic (congestive) heart failure: Secondary | ICD-10-CM | POA: Diagnosis not present

## 2018-03-19 DIAGNOSIS — L97911 Non-pressure chronic ulcer of unspecified part of right lower leg limited to breakdown of skin: Secondary | ICD-10-CM | POA: Diagnosis not present

## 2018-03-19 DIAGNOSIS — I110 Hypertensive heart disease with heart failure: Secondary | ICD-10-CM | POA: Diagnosis not present

## 2018-03-19 DIAGNOSIS — I739 Peripheral vascular disease, unspecified: Secondary | ICD-10-CM | POA: Diagnosis not present

## 2018-03-19 DIAGNOSIS — I872 Venous insufficiency (chronic) (peripheral): Secondary | ICD-10-CM | POA: Diagnosis not present

## 2018-03-22 DIAGNOSIS — I872 Venous insufficiency (chronic) (peripheral): Secondary | ICD-10-CM | POA: Diagnosis not present

## 2018-03-22 DIAGNOSIS — R2689 Other abnormalities of gait and mobility: Secondary | ICD-10-CM | POA: Diagnosis not present

## 2018-03-22 DIAGNOSIS — F0391 Unspecified dementia with behavioral disturbance: Secondary | ICD-10-CM | POA: Diagnosis not present

## 2018-03-22 DIAGNOSIS — I5042 Chronic combined systolic (congestive) and diastolic (congestive) heart failure: Secondary | ICD-10-CM | POA: Diagnosis not present

## 2018-03-22 DIAGNOSIS — I110 Hypertensive heart disease with heart failure: Secondary | ICD-10-CM | POA: Diagnosis not present

## 2018-03-22 DIAGNOSIS — I4891 Unspecified atrial fibrillation: Secondary | ICD-10-CM | POA: Diagnosis not present

## 2018-03-25 DIAGNOSIS — I872 Venous insufficiency (chronic) (peripheral): Secondary | ICD-10-CM | POA: Diagnosis not present

## 2018-03-25 DIAGNOSIS — I5042 Chronic combined systolic (congestive) and diastolic (congestive) heart failure: Secondary | ICD-10-CM | POA: Diagnosis not present

## 2018-03-25 DIAGNOSIS — F0391 Unspecified dementia with behavioral disturbance: Secondary | ICD-10-CM | POA: Diagnosis not present

## 2018-03-25 DIAGNOSIS — I110 Hypertensive heart disease with heart failure: Secondary | ICD-10-CM | POA: Diagnosis not present

## 2018-03-25 DIAGNOSIS — I4891 Unspecified atrial fibrillation: Secondary | ICD-10-CM | POA: Diagnosis not present

## 2018-03-25 DIAGNOSIS — R2689 Other abnormalities of gait and mobility: Secondary | ICD-10-CM | POA: Diagnosis not present

## 2018-03-27 DIAGNOSIS — I4891 Unspecified atrial fibrillation: Secondary | ICD-10-CM | POA: Diagnosis not present

## 2018-03-27 DIAGNOSIS — I5042 Chronic combined systolic (congestive) and diastolic (congestive) heart failure: Secondary | ICD-10-CM | POA: Diagnosis not present

## 2018-03-27 DIAGNOSIS — I872 Venous insufficiency (chronic) (peripheral): Secondary | ICD-10-CM | POA: Diagnosis not present

## 2018-03-27 DIAGNOSIS — F0391 Unspecified dementia with behavioral disturbance: Secondary | ICD-10-CM | POA: Diagnosis not present

## 2018-03-27 DIAGNOSIS — R2689 Other abnormalities of gait and mobility: Secondary | ICD-10-CM | POA: Diagnosis not present

## 2018-03-27 DIAGNOSIS — I110 Hypertensive heart disease with heart failure: Secondary | ICD-10-CM | POA: Diagnosis not present

## 2018-04-01 DIAGNOSIS — R2689 Other abnormalities of gait and mobility: Secondary | ICD-10-CM | POA: Diagnosis not present

## 2018-04-01 DIAGNOSIS — I110 Hypertensive heart disease with heart failure: Secondary | ICD-10-CM | POA: Diagnosis not present

## 2018-04-01 DIAGNOSIS — F0390 Unspecified dementia without behavioral disturbance: Secondary | ICD-10-CM | POA: Diagnosis not present

## 2018-04-01 DIAGNOSIS — F0391 Unspecified dementia with behavioral disturbance: Secondary | ICD-10-CM | POA: Diagnosis not present

## 2018-04-01 DIAGNOSIS — I4891 Unspecified atrial fibrillation: Secondary | ICD-10-CM | POA: Diagnosis not present

## 2018-04-01 DIAGNOSIS — I5042 Chronic combined systolic (congestive) and diastolic (congestive) heart failure: Secondary | ICD-10-CM | POA: Diagnosis not present

## 2018-04-01 DIAGNOSIS — L851 Acquired keratosis [keratoderma] palmaris et plantaris: Secondary | ICD-10-CM | POA: Diagnosis not present

## 2018-04-01 DIAGNOSIS — L603 Nail dystrophy: Secondary | ICD-10-CM | POA: Diagnosis not present

## 2018-04-01 DIAGNOSIS — M79673 Pain in unspecified foot: Secondary | ICD-10-CM | POA: Diagnosis not present

## 2018-04-01 DIAGNOSIS — I872 Venous insufficiency (chronic) (peripheral): Secondary | ICD-10-CM | POA: Diagnosis not present

## 2018-04-04 DIAGNOSIS — I4891 Unspecified atrial fibrillation: Secondary | ICD-10-CM | POA: Diagnosis not present

## 2018-04-04 DIAGNOSIS — F0391 Unspecified dementia with behavioral disturbance: Secondary | ICD-10-CM | POA: Diagnosis not present

## 2018-04-04 DIAGNOSIS — I110 Hypertensive heart disease with heart failure: Secondary | ICD-10-CM | POA: Diagnosis not present

## 2018-04-04 DIAGNOSIS — I872 Venous insufficiency (chronic) (peripheral): Secondary | ICD-10-CM | POA: Diagnosis not present

## 2018-04-04 DIAGNOSIS — I5042 Chronic combined systolic (congestive) and diastolic (congestive) heart failure: Secondary | ICD-10-CM | POA: Diagnosis not present

## 2018-04-04 DIAGNOSIS — R2689 Other abnormalities of gait and mobility: Secondary | ICD-10-CM | POA: Diagnosis not present

## 2018-04-06 DIAGNOSIS — F0391 Unspecified dementia with behavioral disturbance: Secondary | ICD-10-CM | POA: Diagnosis not present

## 2018-04-06 DIAGNOSIS — I872 Venous insufficiency (chronic) (peripheral): Secondary | ICD-10-CM | POA: Diagnosis not present

## 2018-04-06 DIAGNOSIS — I5042 Chronic combined systolic (congestive) and diastolic (congestive) heart failure: Secondary | ICD-10-CM | POA: Diagnosis not present

## 2018-04-06 DIAGNOSIS — I110 Hypertensive heart disease with heart failure: Secondary | ICD-10-CM | POA: Diagnosis not present

## 2018-04-06 DIAGNOSIS — I4891 Unspecified atrial fibrillation: Secondary | ICD-10-CM | POA: Diagnosis not present

## 2018-04-06 DIAGNOSIS — R2689 Other abnormalities of gait and mobility: Secondary | ICD-10-CM | POA: Diagnosis not present

## 2018-04-08 DIAGNOSIS — I4891 Unspecified atrial fibrillation: Secondary | ICD-10-CM | POA: Diagnosis not present

## 2018-04-08 DIAGNOSIS — I110 Hypertensive heart disease with heart failure: Secondary | ICD-10-CM | POA: Diagnosis not present

## 2018-04-08 DIAGNOSIS — I872 Venous insufficiency (chronic) (peripheral): Secondary | ICD-10-CM | POA: Diagnosis not present

## 2018-04-08 DIAGNOSIS — F0391 Unspecified dementia with behavioral disturbance: Secondary | ICD-10-CM | POA: Diagnosis not present

## 2018-04-08 DIAGNOSIS — R2689 Other abnormalities of gait and mobility: Secondary | ICD-10-CM | POA: Diagnosis not present

## 2018-04-08 DIAGNOSIS — I5042 Chronic combined systolic (congestive) and diastolic (congestive) heart failure: Secondary | ICD-10-CM | POA: Diagnosis not present

## 2018-04-12 DIAGNOSIS — I4891 Unspecified atrial fibrillation: Secondary | ICD-10-CM | POA: Diagnosis not present

## 2018-04-12 DIAGNOSIS — F0391 Unspecified dementia with behavioral disturbance: Secondary | ICD-10-CM | POA: Diagnosis not present

## 2018-04-12 DIAGNOSIS — I110 Hypertensive heart disease with heart failure: Secondary | ICD-10-CM | POA: Diagnosis not present

## 2018-04-12 DIAGNOSIS — I5042 Chronic combined systolic (congestive) and diastolic (congestive) heart failure: Secondary | ICD-10-CM | POA: Diagnosis not present

## 2018-04-12 DIAGNOSIS — R2689 Other abnormalities of gait and mobility: Secondary | ICD-10-CM | POA: Diagnosis not present

## 2018-04-12 DIAGNOSIS — I872 Venous insufficiency (chronic) (peripheral): Secondary | ICD-10-CM | POA: Diagnosis not present

## 2018-04-15 DIAGNOSIS — R2689 Other abnormalities of gait and mobility: Secondary | ICD-10-CM | POA: Diagnosis not present

## 2018-04-15 DIAGNOSIS — I872 Venous insufficiency (chronic) (peripheral): Secondary | ICD-10-CM | POA: Diagnosis not present

## 2018-04-15 DIAGNOSIS — F0391 Unspecified dementia with behavioral disturbance: Secondary | ICD-10-CM | POA: Diagnosis not present

## 2018-04-15 DIAGNOSIS — I110 Hypertensive heart disease with heart failure: Secondary | ICD-10-CM | POA: Diagnosis not present

## 2018-04-15 DIAGNOSIS — I5042 Chronic combined systolic (congestive) and diastolic (congestive) heart failure: Secondary | ICD-10-CM | POA: Diagnosis not present

## 2018-04-15 DIAGNOSIS — I4891 Unspecified atrial fibrillation: Secondary | ICD-10-CM | POA: Diagnosis not present

## 2018-04-17 DIAGNOSIS — R2689 Other abnormalities of gait and mobility: Secondary | ICD-10-CM | POA: Diagnosis not present

## 2018-04-17 DIAGNOSIS — I5042 Chronic combined systolic (congestive) and diastolic (congestive) heart failure: Secondary | ICD-10-CM | POA: Diagnosis not present

## 2018-04-17 DIAGNOSIS — I872 Venous insufficiency (chronic) (peripheral): Secondary | ICD-10-CM | POA: Diagnosis not present

## 2018-04-17 DIAGNOSIS — I110 Hypertensive heart disease with heart failure: Secondary | ICD-10-CM | POA: Diagnosis not present

## 2018-04-17 DIAGNOSIS — I4891 Unspecified atrial fibrillation: Secondary | ICD-10-CM | POA: Diagnosis not present

## 2018-04-17 DIAGNOSIS — F0391 Unspecified dementia with behavioral disturbance: Secondary | ICD-10-CM | POA: Diagnosis not present

## 2018-04-19 DIAGNOSIS — R351 Nocturia: Secondary | ICD-10-CM | POA: Diagnosis not present

## 2018-04-19 DIAGNOSIS — N3941 Urge incontinence: Secondary | ICD-10-CM | POA: Diagnosis not present

## 2018-04-24 DIAGNOSIS — I872 Venous insufficiency (chronic) (peripheral): Secondary | ICD-10-CM | POA: Diagnosis not present

## 2018-04-24 DIAGNOSIS — I4891 Unspecified atrial fibrillation: Secondary | ICD-10-CM | POA: Diagnosis not present

## 2018-04-24 DIAGNOSIS — F0391 Unspecified dementia with behavioral disturbance: Secondary | ICD-10-CM | POA: Diagnosis not present

## 2018-04-24 DIAGNOSIS — F39 Unspecified mood [affective] disorder: Secondary | ICD-10-CM | POA: Diagnosis not present

## 2018-04-24 DIAGNOSIS — I5042 Chronic combined systolic (congestive) and diastolic (congestive) heart failure: Secondary | ICD-10-CM | POA: Diagnosis not present

## 2018-04-24 DIAGNOSIS — G309 Alzheimer's disease, unspecified: Secondary | ICD-10-CM | POA: Diagnosis not present

## 2018-04-24 DIAGNOSIS — F419 Anxiety disorder, unspecified: Secondary | ICD-10-CM | POA: Diagnosis not present

## 2018-04-24 DIAGNOSIS — R2689 Other abnormalities of gait and mobility: Secondary | ICD-10-CM | POA: Diagnosis not present

## 2018-04-24 DIAGNOSIS — I110 Hypertensive heart disease with heart failure: Secondary | ICD-10-CM | POA: Diagnosis not present

## 2018-04-29 DIAGNOSIS — R2689 Other abnormalities of gait and mobility: Secondary | ICD-10-CM | POA: Diagnosis not present

## 2018-04-29 DIAGNOSIS — I872 Venous insufficiency (chronic) (peripheral): Secondary | ICD-10-CM | POA: Diagnosis not present

## 2018-04-29 DIAGNOSIS — I4891 Unspecified atrial fibrillation: Secondary | ICD-10-CM | POA: Diagnosis not present

## 2018-04-29 DIAGNOSIS — I5042 Chronic combined systolic (congestive) and diastolic (congestive) heart failure: Secondary | ICD-10-CM | POA: Diagnosis not present

## 2018-04-29 DIAGNOSIS — F0391 Unspecified dementia with behavioral disturbance: Secondary | ICD-10-CM | POA: Diagnosis not present

## 2018-04-29 DIAGNOSIS — I110 Hypertensive heart disease with heart failure: Secondary | ICD-10-CM | POA: Diagnosis not present

## 2018-04-30 ENCOUNTER — Non-Acute Institutional Stay: Payer: Medicare Other | Admitting: Hospice and Palliative Medicine

## 2018-04-30 DIAGNOSIS — Z515 Encounter for palliative care: Secondary | ICD-10-CM

## 2018-05-01 NOTE — Progress Notes (Signed)
PALLIATIVE CARE CONSULT VISIT   PATIENT NAME: [name] DOB: 1924/11/18 MRN: [id number]  PRIMARY CARE PROVIDER:   Marton Redwood, MD  REFERRING PROVIDER:  Marton Redwood, MD [name], [name] [id number]  RESPONSIBLE PARTY:   Husband  ASSESSMENT:     Patient appears to have been stable without recent decline. I called and spoke with her husband, who had no concerns.   I reviewed a MOST form with husband via phone. He says he would want DNAR, limited scope of treatment, IVF/antibiotics if indicated and no feeding tube. I signed the form and left it with staff for him to review the next time he is at the facliity.   RECOMMENDATIONS and PLAN:  1. MOST form completed  I spent 15 minutes providing this consultation,  from 1400 to 1415. More than 50% of the time in this consultation was spent coordinating communication.   HISTORY OF PRESENT ILLNESS:  [name] is a 84 yo woman with multiple medical problems including diastolic dysfuntion with h/o CHF, dementia, SSS s/p PPM, recurrent afib s/p cardioversion, OA, chronic back pain, dysphagia, HTN, HLD, RAS s/p angioplasty, seizure d/o, and h/o SIADH. Palliative care has been asked to help clarify goals and code status.   CODE STATUS: DNR  PPS: 40% HOSPICE ELIGIBILITY/DIAGNOSIS: TBD  PAST MEDICAL HISTORY:  Past Medical History:  Diagnosis Date  . (HFpEF) heart failure with preserved ejection fraction ([place])   . Arthritis   . Atrial fibrillation [facility])    Prior TEE/CV in May 2012; managed with rate control and anticoagulation  . Back pain    CHRONIC  . Dysphagia   . GERD (gastroesophageal reflux disease)   . Hiatal hernia   . Hypertension   . Hyponatremia    improved  . Osteopenia   . Pacemaker   . Pneumonia 04/2010  . Renal artery stenosis ([place]) 2008   STATUS POST ANGIOPLASTY  . Seizure disorder ([place])    on anti-epileptic therapy  . SIADH (syndrome of inappropriate ADH production) ([place])    h/o HYPONATREMIA ;  LIKELY RELATED TO HER LUNG PROCESS  . Tachy-brady syndrome [facility])    with pauses noted in May 2012; s/p PTVP October 2012  . Urinary incontinence     SOCIAL HX:  Social History   Tobacco Use  . Smoking status: Never Smoker  . Smokeless tobacco: Never Used  Substance Use Topics  . Alcohol use: No    ALLERGIES:  Allergies  Allergen Reactions  . Amiodarone     Possibly caused pneumonitis     PERTINENT MEDICATIONS:  Outpatient Encounter Medications as of 04/30/2018  Medication Sig  . BENICAR 40 MG tablet TAKE 1 TABLET DAILY  . Calcium Citrate (CITRACAL PO) Take 1 tablet by mouth 2 (two) times daily.   . Cholecalciferol (VITAMIN D) 400 UNITS capsule Take 400 Units by mouth 3 (three) times daily.  . divalproex (DEPAKOTE) 500 MG EC tablet Take 500 mg by mouth at bedtime.    . dofetilide (TIKOSYN) 250 MCG capsule TAKE 1 CAPSULE TWICE A DAY  . dorzolamide-timolol (COSOPT) 22.3-6.8 MG/ML ophthalmic solution Put one drop in right eye twice a day (use as directed)  . furosemide (LASIX) 40 MG tablet TAKE 1 TABLET DAILY  . HYDROcodone-acetaminophen (VICODIN) 5-500 MG per tablet Take 1 tablet by mouth every 6 (six) hours as needed. For pain   . KLOR-CON M10 10 MEQ tablet TAKE 1 TABLET TWICE A DAY  . LATANOPROST OP Place 1 drop  into the right eye at bedtime.  . metoprolol tartrate (LOPRESSOR) 25 MG tablet TAKE 1 TABLET TWICE A DAY  . mirabegron ER (MYRBETRIQ) 50 MG TB24 tablet Take 50 mg by mouth daily.  . Multiple Vitamins-Minerals (MULTIVITAMINS THER. W/MINERALS) TABS Take 1 tablet by mouth daily.    . pantoprazole (PROTONIX) 40 MG tablet Take 1 tablet by mouth daily.  . polyethylene glycol powder (GLYCOLAX/MIRALAX) powder Take 17 g by mouth daily as needed. For constipation  . PRADAXA 75 MG CAPS capsule TAKE 1 CAPSULE EVERY 12 HOURS  . prednisoLONE acetate (PRED FORTE) 1 % ophthalmic suspension Place 1 drop into the left eye 2 (two) times daily.  . VESICARE 5 MG tablet Take 5 mg by mouth  daily.    No facility-administered encounter medications on file as of 04/30/2018.     PHYSICAL EXAM:   General: NAD, frail appearing, thin, sitting in chair Cardiovascular: regular rate and rhythm Pulmonary: clear ant fields Abdomen: soft, nontender, + bowel sounds Extremities: no edema Skin: no rashes Neurological: Weakness, confused, speech clear  Jacqueline Hong, NP

## 2018-05-06 DIAGNOSIS — I4891 Unspecified atrial fibrillation: Secondary | ICD-10-CM | POA: Diagnosis not present

## 2018-05-06 DIAGNOSIS — I110 Hypertensive heart disease with heart failure: Secondary | ICD-10-CM | POA: Diagnosis not present

## 2018-05-06 DIAGNOSIS — I872 Venous insufficiency (chronic) (peripheral): Secondary | ICD-10-CM | POA: Diagnosis not present

## 2018-05-06 DIAGNOSIS — F0391 Unspecified dementia with behavioral disturbance: Secondary | ICD-10-CM | POA: Diagnosis not present

## 2018-05-06 DIAGNOSIS — I5042 Chronic combined systolic (congestive) and diastolic (congestive) heart failure: Secondary | ICD-10-CM | POA: Diagnosis not present

## 2018-05-06 DIAGNOSIS — R2689 Other abnormalities of gait and mobility: Secondary | ICD-10-CM | POA: Diagnosis not present

## 2018-05-08 DIAGNOSIS — G309 Alzheimer's disease, unspecified: Secondary | ICD-10-CM | POA: Diagnosis not present

## 2018-05-08 DIAGNOSIS — R2689 Other abnormalities of gait and mobility: Secondary | ICD-10-CM | POA: Diagnosis not present

## 2018-05-08 DIAGNOSIS — F39 Unspecified mood [affective] disorder: Secondary | ICD-10-CM | POA: Diagnosis not present

## 2018-05-08 DIAGNOSIS — F0391 Unspecified dementia with behavioral disturbance: Secondary | ICD-10-CM | POA: Diagnosis not present

## 2018-05-08 DIAGNOSIS — I4891 Unspecified atrial fibrillation: Secondary | ICD-10-CM | POA: Diagnosis not present

## 2018-05-08 DIAGNOSIS — I872 Venous insufficiency (chronic) (peripheral): Secondary | ICD-10-CM | POA: Diagnosis not present

## 2018-05-08 DIAGNOSIS — I110 Hypertensive heart disease with heart failure: Secondary | ICD-10-CM | POA: Diagnosis not present

## 2018-05-08 DIAGNOSIS — F419 Anxiety disorder, unspecified: Secondary | ICD-10-CM | POA: Diagnosis not present

## 2018-05-08 DIAGNOSIS — I5042 Chronic combined systolic (congestive) and diastolic (congestive) heart failure: Secondary | ICD-10-CM | POA: Diagnosis not present

## 2018-05-10 DIAGNOSIS — F0391 Unspecified dementia with behavioral disturbance: Secondary | ICD-10-CM | POA: Diagnosis not present

## 2018-05-10 DIAGNOSIS — I872 Venous insufficiency (chronic) (peripheral): Secondary | ICD-10-CM | POA: Diagnosis not present

## 2018-05-10 DIAGNOSIS — I5042 Chronic combined systolic (congestive) and diastolic (congestive) heart failure: Secondary | ICD-10-CM | POA: Diagnosis not present

## 2018-05-10 DIAGNOSIS — I4891 Unspecified atrial fibrillation: Secondary | ICD-10-CM | POA: Diagnosis not present

## 2018-05-10 DIAGNOSIS — I110 Hypertensive heart disease with heart failure: Secondary | ICD-10-CM | POA: Diagnosis not present

## 2018-05-10 DIAGNOSIS — R2689 Other abnormalities of gait and mobility: Secondary | ICD-10-CM | POA: Diagnosis not present

## 2018-05-13 DIAGNOSIS — I5042 Chronic combined systolic (congestive) and diastolic (congestive) heart failure: Secondary | ICD-10-CM | POA: Diagnosis not present

## 2018-05-13 DIAGNOSIS — I872 Venous insufficiency (chronic) (peripheral): Secondary | ICD-10-CM | POA: Diagnosis not present

## 2018-05-13 DIAGNOSIS — R2689 Other abnormalities of gait and mobility: Secondary | ICD-10-CM | POA: Diagnosis not present

## 2018-05-13 DIAGNOSIS — F0391 Unspecified dementia with behavioral disturbance: Secondary | ICD-10-CM | POA: Diagnosis not present

## 2018-05-13 DIAGNOSIS — I4891 Unspecified atrial fibrillation: Secondary | ICD-10-CM | POA: Diagnosis not present

## 2018-05-13 DIAGNOSIS — I110 Hypertensive heart disease with heart failure: Secondary | ICD-10-CM | POA: Diagnosis not present

## 2018-05-14 ENCOUNTER — Ambulatory Visit (INDEPENDENT_AMBULATORY_CARE_PROVIDER_SITE_OTHER): Payer: Medicare Other | Admitting: *Deleted

## 2018-05-14 DIAGNOSIS — I495 Sick sinus syndrome: Secondary | ICD-10-CM

## 2018-05-14 NOTE — Progress Notes (Signed)
Remote pacemaker transmission.   

## 2018-05-16 ENCOUNTER — Encounter: Payer: Self-pay | Admitting: Cardiology

## 2018-05-16 DIAGNOSIS — I4891 Unspecified atrial fibrillation: Secondary | ICD-10-CM | POA: Diagnosis not present

## 2018-05-16 DIAGNOSIS — F0391 Unspecified dementia with behavioral disturbance: Secondary | ICD-10-CM | POA: Diagnosis not present

## 2018-05-16 DIAGNOSIS — I110 Hypertensive heart disease with heart failure: Secondary | ICD-10-CM | POA: Diagnosis not present

## 2018-05-16 DIAGNOSIS — I872 Venous insufficiency (chronic) (peripheral): Secondary | ICD-10-CM | POA: Diagnosis not present

## 2018-05-16 DIAGNOSIS — I5042 Chronic combined systolic (congestive) and diastolic (congestive) heart failure: Secondary | ICD-10-CM | POA: Diagnosis not present

## 2018-05-16 DIAGNOSIS — R2689 Other abnormalities of gait and mobility: Secondary | ICD-10-CM | POA: Diagnosis not present

## 2018-05-17 DIAGNOSIS — I5023 Acute on chronic systolic (congestive) heart failure: Secondary | ICD-10-CM | POA: Diagnosis not present

## 2018-05-21 DIAGNOSIS — R2689 Other abnormalities of gait and mobility: Secondary | ICD-10-CM | POA: Diagnosis not present

## 2018-05-21 DIAGNOSIS — I110 Hypertensive heart disease with heart failure: Secondary | ICD-10-CM | POA: Diagnosis not present

## 2018-05-21 DIAGNOSIS — I872 Venous insufficiency (chronic) (peripheral): Secondary | ICD-10-CM | POA: Diagnosis not present

## 2018-05-21 DIAGNOSIS — I5042 Chronic combined systolic (congestive) and diastolic (congestive) heart failure: Secondary | ICD-10-CM | POA: Diagnosis not present

## 2018-05-21 DIAGNOSIS — I4891 Unspecified atrial fibrillation: Secondary | ICD-10-CM | POA: Diagnosis not present

## 2018-05-21 DIAGNOSIS — F0391 Unspecified dementia with behavioral disturbance: Secondary | ICD-10-CM | POA: Diagnosis not present

## 2018-05-22 DIAGNOSIS — I5042 Chronic combined systolic (congestive) and diastolic (congestive) heart failure: Secondary | ICD-10-CM | POA: Diagnosis not present

## 2018-05-22 DIAGNOSIS — I110 Hypertensive heart disease with heart failure: Secondary | ICD-10-CM | POA: Diagnosis not present

## 2018-05-22 DIAGNOSIS — F0391 Unspecified dementia with behavioral disturbance: Secondary | ICD-10-CM | POA: Diagnosis not present

## 2018-05-22 DIAGNOSIS — R2689 Other abnormalities of gait and mobility: Secondary | ICD-10-CM | POA: Diagnosis not present

## 2018-05-22 DIAGNOSIS — G309 Alzheimer's disease, unspecified: Secondary | ICD-10-CM | POA: Diagnosis not present

## 2018-05-22 DIAGNOSIS — F419 Anxiety disorder, unspecified: Secondary | ICD-10-CM | POA: Diagnosis not present

## 2018-05-22 DIAGNOSIS — I5040 Unspecified combined systolic (congestive) and diastolic (congestive) heart failure: Secondary | ICD-10-CM | POA: Diagnosis not present

## 2018-05-22 DIAGNOSIS — I4891 Unspecified atrial fibrillation: Secondary | ICD-10-CM | POA: Diagnosis not present

## 2018-05-22 DIAGNOSIS — F39 Unspecified mood [affective] disorder: Secondary | ICD-10-CM | POA: Diagnosis not present

## 2018-05-22 DIAGNOSIS — I872 Venous insufficiency (chronic) (peripheral): Secondary | ICD-10-CM | POA: Diagnosis not present

## 2018-05-23 DIAGNOSIS — I5042 Chronic combined systolic (congestive) and diastolic (congestive) heart failure: Secondary | ICD-10-CM | POA: Diagnosis not present

## 2018-05-23 DIAGNOSIS — I10 Essential (primary) hypertension: Secondary | ICD-10-CM | POA: Diagnosis not present

## 2018-05-23 DIAGNOSIS — F0391 Unspecified dementia with behavioral disturbance: Secondary | ICD-10-CM | POA: Diagnosis not present

## 2018-05-23 DIAGNOSIS — R2689 Other abnormalities of gait and mobility: Secondary | ICD-10-CM | POA: Diagnosis not present

## 2018-05-23 DIAGNOSIS — I872 Venous insufficiency (chronic) (peripheral): Secondary | ICD-10-CM | POA: Diagnosis not present

## 2018-05-23 DIAGNOSIS — R0602 Shortness of breath: Secondary | ICD-10-CM | POA: Diagnosis not present

## 2018-05-23 DIAGNOSIS — D649 Anemia, unspecified: Secondary | ICD-10-CM | POA: Diagnosis not present

## 2018-05-23 DIAGNOSIS — I4891 Unspecified atrial fibrillation: Secondary | ICD-10-CM | POA: Diagnosis not present

## 2018-05-23 DIAGNOSIS — I110 Hypertensive heart disease with heart failure: Secondary | ICD-10-CM | POA: Diagnosis not present

## 2018-05-23 DIAGNOSIS — Z79899 Other long term (current) drug therapy: Secondary | ICD-10-CM | POA: Diagnosis not present

## 2018-05-24 ENCOUNTER — Encounter: Payer: Self-pay | Admitting: Cardiology

## 2018-05-24 DIAGNOSIS — I5042 Chronic combined systolic (congestive) and diastolic (congestive) heart failure: Secondary | ICD-10-CM | POA: Diagnosis not present

## 2018-05-24 DIAGNOSIS — I110 Hypertensive heart disease with heart failure: Secondary | ICD-10-CM | POA: Diagnosis not present

## 2018-05-24 DIAGNOSIS — I4891 Unspecified atrial fibrillation: Secondary | ICD-10-CM | POA: Diagnosis not present

## 2018-05-24 DIAGNOSIS — R6889 Other general symptoms and signs: Secondary | ICD-10-CM | POA: Diagnosis not present

## 2018-05-24 DIAGNOSIS — R2689 Other abnormalities of gait and mobility: Secondary | ICD-10-CM | POA: Diagnosis not present

## 2018-05-24 DIAGNOSIS — F0391 Unspecified dementia with behavioral disturbance: Secondary | ICD-10-CM | POA: Diagnosis not present

## 2018-05-24 DIAGNOSIS — I872 Venous insufficiency (chronic) (peripheral): Secondary | ICD-10-CM | POA: Diagnosis not present

## 2018-05-27 DIAGNOSIS — I872 Venous insufficiency (chronic) (peripheral): Secondary | ICD-10-CM | POA: Diagnosis not present

## 2018-05-27 DIAGNOSIS — I110 Hypertensive heart disease with heart failure: Secondary | ICD-10-CM | POA: Diagnosis not present

## 2018-05-27 DIAGNOSIS — R2689 Other abnormalities of gait and mobility: Secondary | ICD-10-CM | POA: Diagnosis not present

## 2018-05-27 DIAGNOSIS — F0391 Unspecified dementia with behavioral disturbance: Secondary | ICD-10-CM | POA: Diagnosis not present

## 2018-05-27 DIAGNOSIS — I4891 Unspecified atrial fibrillation: Secondary | ICD-10-CM | POA: Diagnosis not present

## 2018-05-27 DIAGNOSIS — I5042 Chronic combined systolic (congestive) and diastolic (congestive) heart failure: Secondary | ICD-10-CM | POA: Diagnosis not present

## 2018-05-29 ENCOUNTER — Encounter: Payer: Self-pay | Admitting: Cardiology

## 2018-05-29 DIAGNOSIS — I4891 Unspecified atrial fibrillation: Secondary | ICD-10-CM | POA: Diagnosis not present

## 2018-05-29 DIAGNOSIS — I5040 Unspecified combined systolic (congestive) and diastolic (congestive) heart failure: Secondary | ICD-10-CM | POA: Diagnosis not present

## 2018-05-29 DIAGNOSIS — I5042 Chronic combined systolic (congestive) and diastolic (congestive) heart failure: Secondary | ICD-10-CM | POA: Diagnosis not present

## 2018-05-29 DIAGNOSIS — R2689 Other abnormalities of gait and mobility: Secondary | ICD-10-CM | POA: Diagnosis not present

## 2018-05-29 DIAGNOSIS — F0391 Unspecified dementia with behavioral disturbance: Secondary | ICD-10-CM | POA: Diagnosis not present

## 2018-05-29 DIAGNOSIS — I110 Hypertensive heart disease with heart failure: Secondary | ICD-10-CM | POA: Diagnosis not present

## 2018-05-29 DIAGNOSIS — I872 Venous insufficiency (chronic) (peripheral): Secondary | ICD-10-CM | POA: Diagnosis not present

## 2018-05-30 DIAGNOSIS — I4891 Unspecified atrial fibrillation: Secondary | ICD-10-CM | POA: Diagnosis not present

## 2018-05-30 DIAGNOSIS — I5042 Chronic combined systolic (congestive) and diastolic (congestive) heart failure: Secondary | ICD-10-CM | POA: Diagnosis not present

## 2018-05-30 DIAGNOSIS — I110 Hypertensive heart disease with heart failure: Secondary | ICD-10-CM | POA: Diagnosis not present

## 2018-05-30 DIAGNOSIS — R2689 Other abnormalities of gait and mobility: Secondary | ICD-10-CM | POA: Diagnosis not present

## 2018-05-30 DIAGNOSIS — I872 Venous insufficiency (chronic) (peripheral): Secondary | ICD-10-CM | POA: Diagnosis not present

## 2018-05-30 DIAGNOSIS — F0391 Unspecified dementia with behavioral disturbance: Secondary | ICD-10-CM | POA: Diagnosis not present

## 2018-05-31 DIAGNOSIS — F0391 Unspecified dementia with behavioral disturbance: Secondary | ICD-10-CM | POA: Diagnosis not present

## 2018-05-31 DIAGNOSIS — I872 Venous insufficiency (chronic) (peripheral): Secondary | ICD-10-CM | POA: Diagnosis not present

## 2018-05-31 DIAGNOSIS — I4891 Unspecified atrial fibrillation: Secondary | ICD-10-CM | POA: Diagnosis not present

## 2018-05-31 DIAGNOSIS — I5042 Chronic combined systolic (congestive) and diastolic (congestive) heart failure: Secondary | ICD-10-CM | POA: Diagnosis not present

## 2018-05-31 DIAGNOSIS — I110 Hypertensive heart disease with heart failure: Secondary | ICD-10-CM | POA: Diagnosis not present

## 2018-05-31 DIAGNOSIS — R2689 Other abnormalities of gait and mobility: Secondary | ICD-10-CM | POA: Diagnosis not present

## 2018-06-03 ENCOUNTER — Encounter: Payer: Self-pay | Admitting: Cardiology

## 2018-06-03 DIAGNOSIS — I4891 Unspecified atrial fibrillation: Secondary | ICD-10-CM | POA: Diagnosis not present

## 2018-06-03 DIAGNOSIS — R2681 Unsteadiness on feet: Secondary | ICD-10-CM | POA: Diagnosis not present

## 2018-06-03 DIAGNOSIS — F0151 Vascular dementia with behavioral disturbance: Secondary | ICD-10-CM | POA: Diagnosis not present

## 2018-06-03 DIAGNOSIS — F0150 Vascular dementia without behavioral disturbance: Secondary | ICD-10-CM | POA: Diagnosis not present

## 2018-06-03 DIAGNOSIS — I872 Venous insufficiency (chronic) (peripheral): Secondary | ICD-10-CM | POA: Diagnosis not present

## 2018-06-03 DIAGNOSIS — F0391 Unspecified dementia with behavioral disturbance: Secondary | ICD-10-CM | POA: Diagnosis not present

## 2018-06-03 DIAGNOSIS — I10 Essential (primary) hypertension: Secondary | ICD-10-CM | POA: Diagnosis not present

## 2018-06-03 DIAGNOSIS — R2689 Other abnormalities of gait and mobility: Secondary | ICD-10-CM | POA: Diagnosis not present

## 2018-06-03 DIAGNOSIS — L851 Acquired keratosis [keratoderma] palmaris et plantaris: Secondary | ICD-10-CM | POA: Diagnosis not present

## 2018-06-03 DIAGNOSIS — D649 Anemia, unspecified: Secondary | ICD-10-CM | POA: Diagnosis not present

## 2018-06-03 DIAGNOSIS — I5042 Chronic combined systolic (congestive) and diastolic (congestive) heart failure: Secondary | ICD-10-CM | POA: Diagnosis not present

## 2018-06-03 DIAGNOSIS — I509 Heart failure, unspecified: Secondary | ICD-10-CM | POA: Diagnosis not present

## 2018-06-03 DIAGNOSIS — R0602 Shortness of breath: Secondary | ICD-10-CM | POA: Diagnosis not present

## 2018-06-03 DIAGNOSIS — I110 Hypertensive heart disease with heart failure: Secondary | ICD-10-CM | POA: Diagnosis not present

## 2018-06-03 DIAGNOSIS — M79673 Pain in unspecified foot: Secondary | ICD-10-CM | POA: Diagnosis not present

## 2018-06-03 DIAGNOSIS — F0390 Unspecified dementia without behavioral disturbance: Secondary | ICD-10-CM | POA: Diagnosis not present

## 2018-06-03 DIAGNOSIS — L603 Nail dystrophy: Secondary | ICD-10-CM | POA: Diagnosis not present

## 2018-06-05 DIAGNOSIS — F419 Anxiety disorder, unspecified: Secondary | ICD-10-CM | POA: Diagnosis not present

## 2018-06-05 DIAGNOSIS — R2689 Other abnormalities of gait and mobility: Secondary | ICD-10-CM | POA: Diagnosis not present

## 2018-06-05 DIAGNOSIS — I4891 Unspecified atrial fibrillation: Secondary | ICD-10-CM | POA: Diagnosis not present

## 2018-06-05 DIAGNOSIS — F0391 Unspecified dementia with behavioral disturbance: Secondary | ICD-10-CM | POA: Diagnosis not present

## 2018-06-05 DIAGNOSIS — I110 Hypertensive heart disease with heart failure: Secondary | ICD-10-CM | POA: Diagnosis not present

## 2018-06-05 DIAGNOSIS — I872 Venous insufficiency (chronic) (peripheral): Secondary | ICD-10-CM | POA: Diagnosis not present

## 2018-06-05 DIAGNOSIS — F39 Unspecified mood [affective] disorder: Secondary | ICD-10-CM | POA: Diagnosis not present

## 2018-06-05 DIAGNOSIS — I5042 Chronic combined systolic (congestive) and diastolic (congestive) heart failure: Secondary | ICD-10-CM | POA: Diagnosis not present

## 2018-06-06 DIAGNOSIS — I872 Venous insufficiency (chronic) (peripheral): Secondary | ICD-10-CM | POA: Diagnosis not present

## 2018-06-06 DIAGNOSIS — I110 Hypertensive heart disease with heart failure: Secondary | ICD-10-CM | POA: Diagnosis not present

## 2018-06-06 DIAGNOSIS — I5042 Chronic combined systolic (congestive) and diastolic (congestive) heart failure: Secondary | ICD-10-CM | POA: Diagnosis not present

## 2018-06-06 DIAGNOSIS — I4891 Unspecified atrial fibrillation: Secondary | ICD-10-CM | POA: Diagnosis not present

## 2018-06-06 DIAGNOSIS — F0391 Unspecified dementia with behavioral disturbance: Secondary | ICD-10-CM | POA: Diagnosis not present

## 2018-06-06 DIAGNOSIS — R2689 Other abnormalities of gait and mobility: Secondary | ICD-10-CM | POA: Diagnosis not present

## 2018-06-07 DIAGNOSIS — F0391 Unspecified dementia with behavioral disturbance: Secondary | ICD-10-CM | POA: Diagnosis not present

## 2018-06-07 DIAGNOSIS — I5042 Chronic combined systolic (congestive) and diastolic (congestive) heart failure: Secondary | ICD-10-CM | POA: Diagnosis not present

## 2018-06-07 DIAGNOSIS — I872 Venous insufficiency (chronic) (peripheral): Secondary | ICD-10-CM | POA: Diagnosis not present

## 2018-06-07 DIAGNOSIS — I110 Hypertensive heart disease with heart failure: Secondary | ICD-10-CM | POA: Diagnosis not present

## 2018-06-07 DIAGNOSIS — I4891 Unspecified atrial fibrillation: Secondary | ICD-10-CM | POA: Diagnosis not present

## 2018-06-07 DIAGNOSIS — R2689 Other abnormalities of gait and mobility: Secondary | ICD-10-CM | POA: Diagnosis not present

## 2018-06-08 DIAGNOSIS — I4891 Unspecified atrial fibrillation: Secondary | ICD-10-CM | POA: Diagnosis not present

## 2018-06-08 DIAGNOSIS — I872 Venous insufficiency (chronic) (peripheral): Secondary | ICD-10-CM | POA: Diagnosis not present

## 2018-06-08 DIAGNOSIS — I5042 Chronic combined systolic (congestive) and diastolic (congestive) heart failure: Secondary | ICD-10-CM | POA: Diagnosis not present

## 2018-06-08 DIAGNOSIS — R2689 Other abnormalities of gait and mobility: Secondary | ICD-10-CM | POA: Diagnosis not present

## 2018-06-08 DIAGNOSIS — F0391 Unspecified dementia with behavioral disturbance: Secondary | ICD-10-CM | POA: Diagnosis not present

## 2018-06-08 DIAGNOSIS — I110 Hypertensive heart disease with heart failure: Secondary | ICD-10-CM | POA: Diagnosis not present

## 2018-06-10 DIAGNOSIS — I872 Venous insufficiency (chronic) (peripheral): Secondary | ICD-10-CM | POA: Diagnosis not present

## 2018-06-10 DIAGNOSIS — I4891 Unspecified atrial fibrillation: Secondary | ICD-10-CM | POA: Diagnosis not present

## 2018-06-10 DIAGNOSIS — I5042 Chronic combined systolic (congestive) and diastolic (congestive) heart failure: Secondary | ICD-10-CM | POA: Diagnosis not present

## 2018-06-10 DIAGNOSIS — I110 Hypertensive heart disease with heart failure: Secondary | ICD-10-CM | POA: Diagnosis not present

## 2018-06-10 DIAGNOSIS — F0391 Unspecified dementia with behavioral disturbance: Secondary | ICD-10-CM | POA: Diagnosis not present

## 2018-06-10 DIAGNOSIS — R2689 Other abnormalities of gait and mobility: Secondary | ICD-10-CM | POA: Diagnosis not present

## 2018-06-11 DIAGNOSIS — F0391 Unspecified dementia with behavioral disturbance: Secondary | ICD-10-CM | POA: Diagnosis not present

## 2018-06-11 DIAGNOSIS — I4891 Unspecified atrial fibrillation: Secondary | ICD-10-CM | POA: Diagnosis not present

## 2018-06-11 DIAGNOSIS — R2689 Other abnormalities of gait and mobility: Secondary | ICD-10-CM | POA: Diagnosis not present

## 2018-06-11 DIAGNOSIS — I110 Hypertensive heart disease with heart failure: Secondary | ICD-10-CM | POA: Diagnosis not present

## 2018-06-11 DIAGNOSIS — I5042 Chronic combined systolic (congestive) and diastolic (congestive) heart failure: Secondary | ICD-10-CM | POA: Diagnosis not present

## 2018-06-11 DIAGNOSIS — I872 Venous insufficiency (chronic) (peripheral): Secondary | ICD-10-CM | POA: Diagnosis not present

## 2018-06-12 DIAGNOSIS — F0391 Unspecified dementia with behavioral disturbance: Secondary | ICD-10-CM | POA: Diagnosis not present

## 2018-06-12 DIAGNOSIS — I5042 Chronic combined systolic (congestive) and diastolic (congestive) heart failure: Secondary | ICD-10-CM | POA: Diagnosis not present

## 2018-06-12 DIAGNOSIS — I110 Hypertensive heart disease with heart failure: Secondary | ICD-10-CM | POA: Diagnosis not present

## 2018-06-12 DIAGNOSIS — I4891 Unspecified atrial fibrillation: Secondary | ICD-10-CM | POA: Diagnosis not present

## 2018-06-12 DIAGNOSIS — R2689 Other abnormalities of gait and mobility: Secondary | ICD-10-CM | POA: Diagnosis not present

## 2018-06-12 DIAGNOSIS — I872 Venous insufficiency (chronic) (peripheral): Secondary | ICD-10-CM | POA: Diagnosis not present

## 2018-06-14 DIAGNOSIS — F0391 Unspecified dementia with behavioral disturbance: Secondary | ICD-10-CM | POA: Diagnosis not present

## 2018-06-14 DIAGNOSIS — I110 Hypertensive heart disease with heart failure: Secondary | ICD-10-CM | POA: Diagnosis not present

## 2018-06-14 DIAGNOSIS — I872 Venous insufficiency (chronic) (peripheral): Secondary | ICD-10-CM | POA: Diagnosis not present

## 2018-06-14 DIAGNOSIS — I4891 Unspecified atrial fibrillation: Secondary | ICD-10-CM | POA: Diagnosis not present

## 2018-06-14 DIAGNOSIS — I5042 Chronic combined systolic (congestive) and diastolic (congestive) heart failure: Secondary | ICD-10-CM | POA: Diagnosis not present

## 2018-06-14 DIAGNOSIS — R2689 Other abnormalities of gait and mobility: Secondary | ICD-10-CM | POA: Diagnosis not present

## 2018-06-15 DIAGNOSIS — I4891 Unspecified atrial fibrillation: Secondary | ICD-10-CM | POA: Diagnosis not present

## 2018-06-15 DIAGNOSIS — I110 Hypertensive heart disease with heart failure: Secondary | ICD-10-CM | POA: Diagnosis not present

## 2018-06-15 DIAGNOSIS — F0391 Unspecified dementia with behavioral disturbance: Secondary | ICD-10-CM | POA: Diagnosis not present

## 2018-06-15 DIAGNOSIS — I872 Venous insufficiency (chronic) (peripheral): Secondary | ICD-10-CM | POA: Diagnosis not present

## 2018-06-15 DIAGNOSIS — R2689 Other abnormalities of gait and mobility: Secondary | ICD-10-CM | POA: Diagnosis not present

## 2018-06-15 DIAGNOSIS — I5042 Chronic combined systolic (congestive) and diastolic (congestive) heart failure: Secondary | ICD-10-CM | POA: Diagnosis not present

## 2018-06-17 DIAGNOSIS — I872 Venous insufficiency (chronic) (peripheral): Secondary | ICD-10-CM | POA: Diagnosis not present

## 2018-06-17 DIAGNOSIS — I110 Hypertensive heart disease with heart failure: Secondary | ICD-10-CM | POA: Diagnosis not present

## 2018-06-17 DIAGNOSIS — R2689 Other abnormalities of gait and mobility: Secondary | ICD-10-CM | POA: Diagnosis not present

## 2018-06-17 DIAGNOSIS — I4891 Unspecified atrial fibrillation: Secondary | ICD-10-CM | POA: Diagnosis not present

## 2018-06-17 DIAGNOSIS — F0391 Unspecified dementia with behavioral disturbance: Secondary | ICD-10-CM | POA: Diagnosis not present

## 2018-06-17 DIAGNOSIS — I5042 Chronic combined systolic (congestive) and diastolic (congestive) heart failure: Secondary | ICD-10-CM | POA: Diagnosis not present

## 2018-06-17 LAB — CUP PACEART REMOTE DEVICE CHECK
Battery Remaining Longevity: 44 mo
Battery Remaining Percentage: 40 %
Battery Voltage: 2.84 V
Brady Statistic AP VP Percent: 23 %
Brady Statistic AP VS Percent: 76 %
Brady Statistic AS VS Percent: 1 %
Brady Statistic RA Percent Paced: 93 %
Date Time Interrogation Session: 20190827070013
Implantable Lead Implant Date: 20121016
Implantable Lead Implant Date: 20121016
Implantable Lead Location: 753859
Implantable Lead Model: 1948
Lead Channel Impedance Value: 410 Ohm
Lead Channel Impedance Value: 730 Ohm
Lead Channel Pacing Threshold Amplitude: 0.5 V
Lead Channel Pacing Threshold Amplitude: 1 V
Lead Channel Pacing Threshold Pulse Width: 0.5 ms
Lead Channel Pacing Threshold Pulse Width: 0.5 ms
Lead Channel Sensing Intrinsic Amplitude: 0.6 mV
Lead Channel Setting Pacing Amplitude: 2.5 V
MDC IDC LEAD LOCATION: 753860
MDC IDC MSMT LEADCHNL RV SENSING INTR AMPL: 12 mV
MDC IDC PG IMPLANT DT: 20121016
MDC IDC PG SERIAL: 7280874
MDC IDC SET LEADCHNL RA PACING AMPLITUDE: 2 V
MDC IDC SET LEADCHNL RV PACING PULSEWIDTH: 0.5 ms
MDC IDC SET LEADCHNL RV SENSING SENSITIVITY: 2 mV
MDC IDC STAT BRADY AS VP PERCENT: 1 %
MDC IDC STAT BRADY RV PERCENT PACED: 24 %

## 2018-06-27 ENCOUNTER — Encounter: Payer: Self-pay | Admitting: Cardiology

## 2018-06-27 DIAGNOSIS — I509 Heart failure, unspecified: Secondary | ICD-10-CM | POA: Diagnosis not present

## 2018-06-27 DIAGNOSIS — F0151 Vascular dementia with behavioral disturbance: Secondary | ICD-10-CM | POA: Diagnosis not present

## 2018-06-27 DIAGNOSIS — D649 Anemia, unspecified: Secondary | ICD-10-CM | POA: Diagnosis not present

## 2018-06-27 DIAGNOSIS — F0150 Vascular dementia without behavioral disturbance: Secondary | ICD-10-CM | POA: Diagnosis not present

## 2018-06-27 DIAGNOSIS — R0602 Shortness of breath: Secondary | ICD-10-CM | POA: Diagnosis not present

## 2018-06-27 DIAGNOSIS — I10 Essential (primary) hypertension: Secondary | ICD-10-CM | POA: Diagnosis not present

## 2018-06-28 DIAGNOSIS — I872 Venous insufficiency (chronic) (peripheral): Secondary | ICD-10-CM | POA: Diagnosis not present

## 2018-06-28 DIAGNOSIS — I4891 Unspecified atrial fibrillation: Secondary | ICD-10-CM | POA: Diagnosis not present

## 2018-06-28 DIAGNOSIS — I5042 Chronic combined systolic (congestive) and diastolic (congestive) heart failure: Secondary | ICD-10-CM | POA: Diagnosis not present

## 2018-06-28 DIAGNOSIS — I110 Hypertensive heart disease with heart failure: Secondary | ICD-10-CM | POA: Diagnosis not present

## 2018-06-28 DIAGNOSIS — R2689 Other abnormalities of gait and mobility: Secondary | ICD-10-CM | POA: Diagnosis not present

## 2018-06-28 DIAGNOSIS — F0391 Unspecified dementia with behavioral disturbance: Secondary | ICD-10-CM | POA: Diagnosis not present

## 2018-07-01 DIAGNOSIS — F0391 Unspecified dementia with behavioral disturbance: Secondary | ICD-10-CM | POA: Diagnosis not present

## 2018-07-01 DIAGNOSIS — I4891 Unspecified atrial fibrillation: Secondary | ICD-10-CM | POA: Diagnosis not present

## 2018-07-01 DIAGNOSIS — I110 Hypertensive heart disease with heart failure: Secondary | ICD-10-CM | POA: Diagnosis not present

## 2018-07-01 DIAGNOSIS — I5042 Chronic combined systolic (congestive) and diastolic (congestive) heart failure: Secondary | ICD-10-CM | POA: Diagnosis not present

## 2018-07-01 DIAGNOSIS — I872 Venous insufficiency (chronic) (peripheral): Secondary | ICD-10-CM | POA: Diagnosis not present

## 2018-07-01 DIAGNOSIS — R2689 Other abnormalities of gait and mobility: Secondary | ICD-10-CM | POA: Diagnosis not present

## 2018-07-08 NOTE — Progress Notes (Signed)
[name] Date of Birth: 08/17/1925 Medical Record #[id number]  History of Present Illness: [name] is seen back today for follow up of CHF and sick sinus syndrome.  She has chronic diastolic CHF, tachy brady with PPM in place, past cardioversions in October of 2012 and again in January of 2014. Recurrent atrial fib in June 2014 and was cardioverted and started on Multaq. Has been intolerant to amiodarone due to possible pulmonary toxicity. Then failed on Multaq due to recurrent arrhythmia. She has been  on Tikosyn. Pacer check in August showed AFib burden of 5.9%.   She was seen by Dr Fletcher Anon in May 2019 with venous stasis ulcers. LE arterial dopplers showed no significant disease. Managed conservatively.   On follow up today she is now living in assisted living at [facility]. Her husband reports she is doing well.  She really hasn't been aware of any recurrent AFib. Her  chronic leg swelling is well controlled. Has legs wrapped weekly with ACE bandage.  No increase SOB. Activity is very limited. No chest pain or syncope.      Current Outpatient Medications  Medication Sig Dispense Refill  . BENICAR 40 MG tablet TAKE 1 TABLET DAILY 90 tablet 0  . Calcium Citrate (CITRACAL PO) Take 1 tablet by mouth 2 (two) times daily.     . cholecalciferol (VITAMIN D) 1000 units tablet Take 400 Units by mouth daily.     . divalproex (DEPAKOTE) 500 MG EC tablet Take 500 mg by mouth at bedtime.      . dofetilide (TIKOSYN) 250 MCG capsule TAKE 1 CAPSULE TWICE A DAY 180 capsule 3  . dorzolamide-timolol (COSOPT) 22.3-6.8 MG/ML ophthalmic solution Put one drop in right eye twice a day (use as directed)  4  . furosemide (LASIX) 40 MG tablet TAKE 1 TABLET DAILY 90 tablet 3  . KLOR-CON M10 10 MEQ tablet TAKE 1 TABLET TWICE A DAY 180 tablet 3  . LATANOPROST OP Place 1 drop into the right eye at bedtime.    . memantine (NAMENDA) 10 MG tablet Take 10 mg by mouth daily. Take 1 10 mg tablet in the mornings    . memantine  (NAMENDA) 10 MG tablet Take 5 mg by mouth daily. Take 1/2 tablet at bedtime    . metoprolol tartrate (LOPRESSOR) 25 MG tablet TAKE 1 TABLET TWICE A DAY 180 tablet 3  . mirabegron ER (MYRBETRIQ) 50 MG TB24 tablet Take 50 mg by mouth daily.    . Multiple Vitamins-Minerals (MULTIVITAMINS THER. W/MINERALS) TABS Take 1 tablet by mouth daily.      . pantoprazole (PROTONIX) 40 MG tablet Take 1 tablet by mouth daily.    . polyethylene glycol powder (GLYCOLAX/MIRALAX) powder Take 17 g by mouth daily as needed. For constipation    . PRADAXA 75 MG CAPS capsule TAKE 1 CAPSULE EVERY 12 HOURS 180 capsule 1  . prednisoLONE acetate (PRED FORTE) 1 % ophthalmic suspension Place 1 drop into the left eye 2 (two) times daily.    . Tiotropium Bromide Monohydrate (SPIRIVA RESPIMAT) 1.25 MCG/ACT AERS Inhale into the lungs at bedtime.    . VESICARE 5 MG tablet Take 5 mg by mouth daily.      No current facility-administered medications for this visit.     Allergies  Allergen Reactions  . Amiodarone     Possibly caused pneumonitis    Past Medical History:  Diagnosis Date  . (HFpEF) heart failure with preserved ejection fraction ([place])   . Arthritis   .  Atrial fibrillation [facility])    Prior TEE/CV in May 2012; managed with rate control and anticoagulation  . Back pain    CHRONIC  . Dysphagia   . GERD (gastroesophageal reflux disease)   . Hiatal hernia   . Hypertension   . Hyponatremia    improved  . Osteopenia   . Pacemaker   . Pneumonia 04/2010  . Renal artery stenosis ([place]) 2008   STATUS POST ANGIOPLASTY  . Seizure disorder ([place])    on anti-epileptic therapy  . SIADH (syndrome of inappropriate ADH production) ([place])    h/o HYPONATREMIA ; LIKELY RELATED TO HER LUNG PROCESS  . Tachy-brady syndrome [facility])    with pauses noted in May 2012; s/p PTVP October 2012  . Urinary incontinence     Past Surgical History:  Procedure Laterality Date  . APPENDECTOMY    . BACK SURGERY     X2  . CARDIAC  CATHETERIZATION  2007   Normal  . CARDIOVERSION    . CARDIOVERSION  10/14/2012   Procedure: CARDIOVERSION;  Surgeon: Thayer Headings, MD;  Location: [facility] ENDOSCOPY;  Service: Cardiovascular;  Laterality: N/A;  . CARDIOVERSION N/A 05/30/2013   Procedure: CARDIOVERSION;  Surgeon: Thompson Grayer, MD;  Location: [place];  Service: Cardiovascular;  Laterality: N/A;  . CARDIOVERSION N/A 03/03/2013   Procedure: CARDIOVERSION;  Surgeon: Deboraha Sprang, MD;  Location: [facility] CATH LAB;  Service: Cardiovascular;  Laterality: N/A;  . CATARACT EXTRACTION    . CATARACT EXTRACTION Bilateral   . CHOLECYSTECTOMY    . KNEE ARTHROSCOPY Left   . PACEMAKER INSERTION  06/2011   SJM [facility] DR RF implanted by Dr Rayann Heman  . PARTIAL HYSTERECTOMY    . RENAL ANGIOPLASTY Right 01/2007  . SHOULDER SURGERY    . TONSILLECTOMY    . TOTAL KNEE ARTHROPLASTY Bilateral     Social History   Tobacco Use  Smoking Status Never Smoker  Smokeless Tobacco Never Used    Social History   Substance and Sexual Activity  Alcohol Use No    Family History  Problem Relation Age of Onset  . Stroke Mother   . Diabetes Mother   . Heart disease Mother        Unkown  . Stroke Father   . Diabetes Father   . Prostate cancer Father   . Kidney failure Brother   . Lung disease Brother   . Lung disease Sister   . Hypertension Daughter   . Hypertension Son   . Bladder Cancer Brother     Review of Systems: The review of systems is per the HPI.  All other systems were reviewed and are negative.  Physical Exam: BP 132/72   Pulse 62   Wt 132 lb 9.6 oz (60.1 kg)   SpO2 97%   BMI 23.49 kg/m  GENERAL:  Elderly WF in NAD. Seen in wheelchair HEENT:  PERRL, EOMI, sclera are clear. Oropharynx is clear. NECK:  No jugular venous distention, carotid upstroke brisk and symmetric, no bruits, no thyromegaly or adenopathy LUNGS:  minimal dry crackles CHEST:  Unremarkable HEART:  RRR,  PMI not displaced or sustained,S1 and S2 within normal  limits, no S3, no S4: no clicks, no rubs, no murmurs ABD:  Soft, nontender. BS +, no masses or bruits. No hepatomegaly, no splenomegaly EXT:  2 + pulses throughout, 1+ edema, ACE wraps to mid shin. no cyanosis no clubbing.  SKIN:  Warm and dry. Hyperpigmentation in LE.  NEURO:  Alert and oriented x 3.  Cranial nerves II through XII intact. PSYCH:  Cognitively intact    LABORATORY DATA:  Lab Results  Component Value Date   WBC 8.6 11/12/2017   HGB 12.7 11/12/2017   HCT 38.1 11/12/2017   PLT 299 11/12/2017   GLUCOSE 118 (H) 11/12/2017   ALT 26 11/23/2011   AST 31 11/23/2011   NA 139 11/12/2017   K 4.3 11/12/2017   CL 93 (L) 11/12/2017   CREATININE 0.86 11/12/2017   BUN 24 11/12/2017   CO2 29 11/12/2017   TSH 1.25 05/08/2013   INR 1.69 (H) 03/02/2013   Labs dated 11/10/16: normal BMET and magnesium level. Dated 06/27/18: BUN 26, creatinine 0.85. Potassium 4.5. Other chemistries are normal. CBC normal. CXR 05/29/18 showed chronic changes.   Assessment / Plan: 1. PAF -excellent control on Tikosyn -  pacemaker check showed continued improvement in degree of AFib since on Tikosyn. Burden of 5.9%.  Rate is controlled. Continue Tikosyn and metoprolol. On Pradaxa for anticoagulation.  2. Tachy/brady - with PPM in place - followed by Dr. Rayann Heman  3. HTN - well controlled today.  4. Chronic diastolic HF - some LE edema. Continue leg wraps and lasix. Keep feet elevated when possible. Sodium restriction.  5. Venous stasis disease with ulceration. Improved.  6. Rotator cuff dysfunction with pain.  Chronic. Not a  surgical candidate.   7. Memory loss. Now in assisted living.  I will follow up in 6 months.

## 2018-07-09 DIAGNOSIS — I110 Hypertensive heart disease with heart failure: Secondary | ICD-10-CM | POA: Diagnosis not present

## 2018-07-09 DIAGNOSIS — R2689 Other abnormalities of gait and mobility: Secondary | ICD-10-CM | POA: Diagnosis not present

## 2018-07-09 DIAGNOSIS — F0391 Unspecified dementia with behavioral disturbance: Secondary | ICD-10-CM | POA: Diagnosis not present

## 2018-07-09 DIAGNOSIS — I4891 Unspecified atrial fibrillation: Secondary | ICD-10-CM | POA: Diagnosis not present

## 2018-07-09 DIAGNOSIS — I872 Venous insufficiency (chronic) (peripheral): Secondary | ICD-10-CM | POA: Diagnosis not present

## 2018-07-09 DIAGNOSIS — I5042 Chronic combined systolic (congestive) and diastolic (congestive) heart failure: Secondary | ICD-10-CM | POA: Diagnosis not present

## 2018-07-10 ENCOUNTER — Non-Acute Institutional Stay: Payer: Medicare Other | Admitting: Primary Care

## 2018-07-10 DIAGNOSIS — I5032 Chronic diastolic (congestive) heart failure: Secondary | ICD-10-CM

## 2018-07-10 DIAGNOSIS — I495 Sick sinus syndrome: Secondary | ICD-10-CM | POA: Diagnosis not present

## 2018-07-10 DIAGNOSIS — R5382 Chronic fatigue, unspecified: Secondary | ICD-10-CM | POA: Diagnosis not present

## 2018-07-10 DIAGNOSIS — Z515 Encounter for palliative care: Secondary | ICD-10-CM | POA: Diagnosis not present

## 2018-07-10 NOTE — Progress Notes (Signed)
PALLIATIVE CARE CONSULT VISIT   PATIENT NAME: [name] DOB: Jul 05, 1925 MRN: [id number]  PRIMARY CARE PROVIDER:   Ferd Hibbs, NP REFERRING PROVIDER:  Marton Redwood, [address], [place] [address]  RESPONSIBLE PARTY:   Ulyana Pitones  [phone] or    [phone]  ASSESSMENT:      1. Fluid burden: Assessed with 2 + LE edema and basilar rales bilateral and dyspnea with talking.  Has ace wraps in place but  loosely compressing. Recent labs show BNP = 1889.  Pacemaker per EMR. Recent increased from 40 mg to 80 mg daily of furosemide.  2. Fall risk: High due to edema, confusion, exertional dyspea. Needs wheel chair for safety in ambulation.  3. Goals of Care: DNR, MOST on chart. Husband is Media planner.    RECOMMENDATIONS and PLAN:  1. Fluid overload: Pt may benefit from increased diuresis, consider increasing furosemide or starting a secondary such as metolazone or spironolactone. Review application of ace wrap for maximum compression, consider another product such as coban with soft underlayer. Ordered labs CMP and BNP in 2 weeks.    2. Fall Risk:  Obtain wheel chair for mobility safety. Provide safety education to patient as she can understand.   3. Goals of Care: Contact decision maker to review any goals of care needs or changes. VM left on home line. Follow up 3 months to review goals of care.  I spent 25 minutes providing this consultation,  from 1100 to 1125. More than 50% of the time in this consultation was spent coordinating communication.   HISTORY OF PRESENT ILLNESS:  [name] is a 84 y.o. year old female with multiple medical problems including HTN, HLD, a fibrillation recurrent, Diastolic HFpEF,pacemaker, tachy/brady syndrome, OA, fatigue . Palliative Care was asked to help address goals of care.   CODE STATUS: DNR in ALF chart. MOST present, limited intevention (no feeding tube)  PPS: 30% HOSPICE ELIGIBILITY/DIAGNOSIS: TBD  PAST  MEDICAL HISTORY:  Past Medical History:  Diagnosis Date  . (HFpEF) heart failure with preserved ejection fraction ([place])   . Arthritis   . Atrial fibrillation [facility])    Prior TEE/CV in May 2012; managed with rate control and anticoagulation  . Back pain    CHRONIC  . Dysphagia   . GERD (gastroesophageal reflux disease)   . Hiatal hernia   . Hypertension   . Hyponatremia    improved  . Osteopenia   . Pacemaker   . Pneumonia 04/2010  . Renal artery stenosis ([place]) 2008   STATUS POST ANGIOPLASTY  . Seizure disorder ([place])    on anti-epileptic therapy  . SIADH (syndrome of inappropriate ADH production) ([place])    h/o HYPONATREMIA ; LIKELY RELATED TO HER LUNG PROCESS  . Tachy-brady syndrome [facility])    with pauses noted in May 2012; s/p PTVP October 2012  . Urinary incontinence     SOCIAL HX:  Social History   Tobacco Use  . Smoking status: Never Smoker  . Smokeless tobacco: Never Used  Substance Use Topics  . Alcohol use: No    ALLERGIES:  Allergies  Allergen Reactions  . Amiodarone     Possibly caused pneumonitis     PERTINENT MEDICATIONS:  Outpatient Encounter Medications as of 07/10/2018  Medication Sig  . BENICAR 40 MG tablet TAKE 1 TABLET DAILY  . Calcium Citrate (CITRACAL PO) Take 1 tablet by mouth 2 (two) times daily.   . Cholecalciferol (VITAMIN D) 400 UNITS capsule  Take 400 Units by mouth 3 (three) times daily.  . divalproex (DEPAKOTE) 500 MG EC tablet Take 500 mg by mouth at bedtime.    . dofetilide (TIKOSYN) 250 MCG capsule TAKE 1 CAPSULE TWICE A DAY  . dorzolamide-timolol (COSOPT) 22.3-6.8 MG/ML ophthalmic solution Put one drop in right eye twice a day (use as directed)  . furosemide (LASIX) 40 MG tablet TAKE 1 TABLET DAILY  . HYDROcodone-acetaminophen (VICODIN) 5-500 MG per tablet Take 1 tablet by mouth every 6 (six) hours as needed. For pain   . KLOR-CON M10 10 MEQ tablet TAKE 1 TABLET TWICE A DAY  . LATANOPROST OP Place 1 drop into the right eye at bedtime.    . metoprolol tartrate (LOPRESSOR) 25 MG tablet TAKE 1 TABLET TWICE A DAY  . mirabegron ER (MYRBETRIQ) 50 MG TB24 tablet Take 50 mg by mouth daily.  . Multiple Vitamins-Minerals (MULTIVITAMINS THER. W/MINERALS) TABS Take 1 tablet by mouth daily.    . pantoprazole (PROTONIX) 40 MG tablet Take 1 tablet by mouth daily.  . polyethylene glycol powder (GLYCOLAX/MIRALAX) powder Take 17 g by mouth daily as needed. For constipation  . PRADAXA 75 MG CAPS capsule TAKE 1 CAPSULE EVERY 12 HOURS  . prednisoLONE acetate (PRED FORTE) 1 % ophthalmic suspension Place 1 drop into the left eye 2 (two) times daily.  . VESICARE 5 MG tablet Take 5 mg by mouth daily.    No facility-administered encounter medications on file as of 07/10/2018.     PHYSICAL EXAM:   VS 97.4-61-20  130/67 Ox 95% RA General: NAD, frail appearing, Alert, oriented x 1-2. States fatigue. Cardiovascular: S1 S2  Occ. S4, regular rate and rhythm Pulmonary: Upper lobes clear, rales in bilateral bases Abdomen: soft, nontender, + bowel sounds Extremities: 2+ edema in bil LE with leg wrappings, no joint deformities Skin: no rashes. Skin extremely friable per staff report Neurological: LE weakness, poor balance, dementia FAST 6c-d.  Cyndia Skeeters DNP, AGPCNP-BC

## 2018-07-11 ENCOUNTER — Ambulatory Visit (INDEPENDENT_AMBULATORY_CARE_PROVIDER_SITE_OTHER): Payer: Medicare Other | Admitting: Cardiology

## 2018-07-11 ENCOUNTER — Encounter: Payer: Self-pay | Admitting: Cardiology

## 2018-07-11 VITALS — BP 132/72 | HR 62 | Wt 132.6 lb

## 2018-07-11 DIAGNOSIS — I5032 Chronic diastolic (congestive) heart failure: Secondary | ICD-10-CM

## 2018-07-11 DIAGNOSIS — I480 Paroxysmal atrial fibrillation: Secondary | ICD-10-CM

## 2018-07-11 DIAGNOSIS — I495 Sick sinus syndrome: Secondary | ICD-10-CM | POA: Diagnosis not present

## 2018-07-11 DIAGNOSIS — Z950 Presence of cardiac pacemaker: Secondary | ICD-10-CM | POA: Diagnosis not present

## 2018-07-11 DIAGNOSIS — Z7901 Long term (current) use of anticoagulants: Secondary | ICD-10-CM

## 2018-07-15 DIAGNOSIS — I110 Hypertensive heart disease with heart failure: Secondary | ICD-10-CM | POA: Diagnosis not present

## 2018-07-15 DIAGNOSIS — I4891 Unspecified atrial fibrillation: Secondary | ICD-10-CM | POA: Diagnosis not present

## 2018-07-15 DIAGNOSIS — I5042 Chronic combined systolic (congestive) and diastolic (congestive) heart failure: Secondary | ICD-10-CM | POA: Diagnosis not present

## 2018-07-15 DIAGNOSIS — R2689 Other abnormalities of gait and mobility: Secondary | ICD-10-CM | POA: Diagnosis not present

## 2018-07-15 DIAGNOSIS — F0391 Unspecified dementia with behavioral disturbance: Secondary | ICD-10-CM | POA: Diagnosis not present

## 2018-07-15 DIAGNOSIS — I872 Venous insufficiency (chronic) (peripheral): Secondary | ICD-10-CM | POA: Diagnosis not present

## 2018-07-17 DIAGNOSIS — I4891 Unspecified atrial fibrillation: Secondary | ICD-10-CM | POA: Diagnosis not present

## 2018-07-20 DIAGNOSIS — I739 Peripheral vascular disease, unspecified: Secondary | ICD-10-CM | POA: Diagnosis not present

## 2018-07-20 DIAGNOSIS — F0391 Unspecified dementia with behavioral disturbance: Secondary | ICD-10-CM | POA: Diagnosis not present

## 2018-07-20 DIAGNOSIS — I110 Hypertensive heart disease with heart failure: Secondary | ICD-10-CM | POA: Diagnosis not present

## 2018-07-20 DIAGNOSIS — R2689 Other abnormalities of gait and mobility: Secondary | ICD-10-CM | POA: Diagnosis not present

## 2018-07-20 DIAGNOSIS — I4891 Unspecified atrial fibrillation: Secondary | ICD-10-CM | POA: Diagnosis not present

## 2018-07-20 DIAGNOSIS — I5042 Chronic combined systolic (congestive) and diastolic (congestive) heart failure: Secondary | ICD-10-CM | POA: Diagnosis not present

## 2018-07-22 DIAGNOSIS — I110 Hypertensive heart disease with heart failure: Secondary | ICD-10-CM | POA: Diagnosis not present

## 2018-07-22 DIAGNOSIS — I5042 Chronic combined systolic (congestive) and diastolic (congestive) heart failure: Secondary | ICD-10-CM | POA: Diagnosis not present

## 2018-07-22 DIAGNOSIS — I4891 Unspecified atrial fibrillation: Secondary | ICD-10-CM | POA: Diagnosis not present

## 2018-07-22 DIAGNOSIS — I739 Peripheral vascular disease, unspecified: Secondary | ICD-10-CM | POA: Diagnosis not present

## 2018-07-22 DIAGNOSIS — F0391 Unspecified dementia with behavioral disturbance: Secondary | ICD-10-CM | POA: Diagnosis not present

## 2018-07-22 DIAGNOSIS — R2689 Other abnormalities of gait and mobility: Secondary | ICD-10-CM | POA: Diagnosis not present

## 2018-07-24 DIAGNOSIS — I110 Hypertensive heart disease with heart failure: Secondary | ICD-10-CM | POA: Diagnosis not present

## 2018-07-24 DIAGNOSIS — I5042 Chronic combined systolic (congestive) and diastolic (congestive) heart failure: Secondary | ICD-10-CM | POA: Diagnosis not present

## 2018-07-24 DIAGNOSIS — I739 Peripheral vascular disease, unspecified: Secondary | ICD-10-CM | POA: Diagnosis not present

## 2018-07-24 DIAGNOSIS — I4891 Unspecified atrial fibrillation: Secondary | ICD-10-CM | POA: Diagnosis not present

## 2018-07-24 DIAGNOSIS — R2689 Other abnormalities of gait and mobility: Secondary | ICD-10-CM | POA: Diagnosis not present

## 2018-07-24 DIAGNOSIS — F0391 Unspecified dementia with behavioral disturbance: Secondary | ICD-10-CM | POA: Diagnosis not present

## 2018-07-31 DIAGNOSIS — F39 Unspecified mood [affective] disorder: Secondary | ICD-10-CM | POA: Diagnosis not present

## 2018-07-31 DIAGNOSIS — I4891 Unspecified atrial fibrillation: Secondary | ICD-10-CM | POA: Diagnosis not present

## 2018-07-31 DIAGNOSIS — F419 Anxiety disorder, unspecified: Secondary | ICD-10-CM | POA: Diagnosis not present

## 2018-07-31 DIAGNOSIS — F0391 Unspecified dementia with behavioral disturbance: Secondary | ICD-10-CM | POA: Diagnosis not present

## 2018-07-31 DIAGNOSIS — I739 Peripheral vascular disease, unspecified: Secondary | ICD-10-CM | POA: Diagnosis not present

## 2018-07-31 DIAGNOSIS — I110 Hypertensive heart disease with heart failure: Secondary | ICD-10-CM | POA: Diagnosis not present

## 2018-07-31 DIAGNOSIS — I5042 Chronic combined systolic (congestive) and diastolic (congestive) heart failure: Secondary | ICD-10-CM | POA: Diagnosis not present

## 2018-07-31 DIAGNOSIS — R2689 Other abnormalities of gait and mobility: Secondary | ICD-10-CM | POA: Diagnosis not present

## 2018-08-01 DIAGNOSIS — R0602 Shortness of breath: Secondary | ICD-10-CM | POA: Diagnosis not present

## 2018-08-01 DIAGNOSIS — R569 Unspecified convulsions: Secondary | ICD-10-CM | POA: Diagnosis not present

## 2018-08-01 DIAGNOSIS — D692 Other nonthrombocytopenic purpura: Secondary | ICD-10-CM | POA: Diagnosis not present

## 2018-08-01 DIAGNOSIS — I10 Essential (primary) hypertension: Secondary | ICD-10-CM | POA: Diagnosis not present

## 2018-08-01 DIAGNOSIS — F0150 Vascular dementia without behavioral disturbance: Secondary | ICD-10-CM | POA: Diagnosis not present

## 2018-08-02 DIAGNOSIS — I5042 Chronic combined systolic (congestive) and diastolic (congestive) heart failure: Secondary | ICD-10-CM | POA: Diagnosis not present

## 2018-08-02 DIAGNOSIS — I110 Hypertensive heart disease with heart failure: Secondary | ICD-10-CM | POA: Diagnosis not present

## 2018-08-02 DIAGNOSIS — F0391 Unspecified dementia with behavioral disturbance: Secondary | ICD-10-CM | POA: Diagnosis not present

## 2018-08-02 DIAGNOSIS — I4891 Unspecified atrial fibrillation: Secondary | ICD-10-CM | POA: Diagnosis not present

## 2018-08-02 DIAGNOSIS — R2689 Other abnormalities of gait and mobility: Secondary | ICD-10-CM | POA: Diagnosis not present

## 2018-08-02 DIAGNOSIS — I739 Peripheral vascular disease, unspecified: Secondary | ICD-10-CM | POA: Diagnosis not present

## 2018-08-05 DIAGNOSIS — F0390 Unspecified dementia without behavioral disturbance: Secondary | ICD-10-CM | POA: Diagnosis not present

## 2018-08-05 DIAGNOSIS — F0391 Unspecified dementia with behavioral disturbance: Secondary | ICD-10-CM | POA: Diagnosis not present

## 2018-08-05 DIAGNOSIS — I110 Hypertensive heart disease with heart failure: Secondary | ICD-10-CM | POA: Diagnosis not present

## 2018-08-05 DIAGNOSIS — R2681 Unsteadiness on feet: Secondary | ICD-10-CM | POA: Diagnosis not present

## 2018-08-05 DIAGNOSIS — L603 Nail dystrophy: Secondary | ICD-10-CM | POA: Diagnosis not present

## 2018-08-05 DIAGNOSIS — R2689 Other abnormalities of gait and mobility: Secondary | ICD-10-CM | POA: Diagnosis not present

## 2018-08-05 DIAGNOSIS — M79673 Pain in unspecified foot: Secondary | ICD-10-CM | POA: Diagnosis not present

## 2018-08-05 DIAGNOSIS — I5042 Chronic combined systolic (congestive) and diastolic (congestive) heart failure: Secondary | ICD-10-CM | POA: Diagnosis not present

## 2018-08-05 DIAGNOSIS — I4891 Unspecified atrial fibrillation: Secondary | ICD-10-CM | POA: Diagnosis not present

## 2018-08-05 DIAGNOSIS — I739 Peripheral vascular disease, unspecified: Secondary | ICD-10-CM | POA: Diagnosis not present

## 2018-08-08 DIAGNOSIS — I739 Peripheral vascular disease, unspecified: Secondary | ICD-10-CM | POA: Diagnosis not present

## 2018-08-08 DIAGNOSIS — I5042 Chronic combined systolic (congestive) and diastolic (congestive) heart failure: Secondary | ICD-10-CM | POA: Diagnosis not present

## 2018-08-08 DIAGNOSIS — F0391 Unspecified dementia with behavioral disturbance: Secondary | ICD-10-CM | POA: Diagnosis not present

## 2018-08-08 DIAGNOSIS — R2689 Other abnormalities of gait and mobility: Secondary | ICD-10-CM | POA: Diagnosis not present

## 2018-08-08 DIAGNOSIS — I110 Hypertensive heart disease with heart failure: Secondary | ICD-10-CM | POA: Diagnosis not present

## 2018-08-08 DIAGNOSIS — I4891 Unspecified atrial fibrillation: Secondary | ICD-10-CM | POA: Diagnosis not present

## 2018-08-12 DIAGNOSIS — I739 Peripheral vascular disease, unspecified: Secondary | ICD-10-CM | POA: Diagnosis not present

## 2018-08-12 DIAGNOSIS — F0391 Unspecified dementia with behavioral disturbance: Secondary | ICD-10-CM | POA: Diagnosis not present

## 2018-08-12 DIAGNOSIS — I5042 Chronic combined systolic (congestive) and diastolic (congestive) heart failure: Secondary | ICD-10-CM | POA: Diagnosis not present

## 2018-08-12 DIAGNOSIS — R2689 Other abnormalities of gait and mobility: Secondary | ICD-10-CM | POA: Diagnosis not present

## 2018-08-12 DIAGNOSIS — I110 Hypertensive heart disease with heart failure: Secondary | ICD-10-CM | POA: Diagnosis not present

## 2018-08-12 DIAGNOSIS — I4891 Unspecified atrial fibrillation: Secondary | ICD-10-CM | POA: Diagnosis not present

## 2018-08-13 ENCOUNTER — Ambulatory Visit (INDEPENDENT_AMBULATORY_CARE_PROVIDER_SITE_OTHER): Payer: Medicare Other

## 2018-08-13 DIAGNOSIS — I5022 Chronic systolic (congestive) heart failure: Secondary | ICD-10-CM

## 2018-08-13 DIAGNOSIS — I495 Sick sinus syndrome: Secondary | ICD-10-CM

## 2018-08-13 NOTE — Progress Notes (Signed)
Remote pacemaker transmission.   

## 2018-08-14 DIAGNOSIS — F0391 Unspecified dementia with behavioral disturbance: Secondary | ICD-10-CM | POA: Diagnosis not present

## 2018-08-14 DIAGNOSIS — F419 Anxiety disorder, unspecified: Secondary | ICD-10-CM | POA: Diagnosis not present

## 2018-08-14 DIAGNOSIS — F39 Unspecified mood [affective] disorder: Secondary | ICD-10-CM | POA: Diagnosis not present

## 2018-08-20 DIAGNOSIS — I5042 Chronic combined systolic (congestive) and diastolic (congestive) heart failure: Secondary | ICD-10-CM | POA: Diagnosis not present

## 2018-08-20 DIAGNOSIS — R2689 Other abnormalities of gait and mobility: Secondary | ICD-10-CM | POA: Diagnosis not present

## 2018-08-20 DIAGNOSIS — I4891 Unspecified atrial fibrillation: Secondary | ICD-10-CM | POA: Diagnosis not present

## 2018-08-20 DIAGNOSIS — F0391 Unspecified dementia with behavioral disturbance: Secondary | ICD-10-CM | POA: Diagnosis not present

## 2018-08-20 DIAGNOSIS — I739 Peripheral vascular disease, unspecified: Secondary | ICD-10-CM | POA: Diagnosis not present

## 2018-08-20 DIAGNOSIS — I110 Hypertensive heart disease with heart failure: Secondary | ICD-10-CM | POA: Diagnosis not present

## 2018-08-29 DIAGNOSIS — F0391 Unspecified dementia with behavioral disturbance: Secondary | ICD-10-CM | POA: Diagnosis not present

## 2018-08-29 DIAGNOSIS — I5042 Chronic combined systolic (congestive) and diastolic (congestive) heart failure: Secondary | ICD-10-CM | POA: Diagnosis not present

## 2018-08-29 DIAGNOSIS — R2689 Other abnormalities of gait and mobility: Secondary | ICD-10-CM | POA: Diagnosis not present

## 2018-08-29 DIAGNOSIS — I4891 Unspecified atrial fibrillation: Secondary | ICD-10-CM | POA: Diagnosis not present

## 2018-08-29 DIAGNOSIS — I739 Peripheral vascular disease, unspecified: Secondary | ICD-10-CM | POA: Diagnosis not present

## 2018-08-29 DIAGNOSIS — I110 Hypertensive heart disease with heart failure: Secondary | ICD-10-CM | POA: Diagnosis not present

## 2018-09-02 DIAGNOSIS — I739 Peripheral vascular disease, unspecified: Secondary | ICD-10-CM | POA: Diagnosis not present

## 2018-09-02 DIAGNOSIS — I110 Hypertensive heart disease with heart failure: Secondary | ICD-10-CM | POA: Diagnosis not present

## 2018-09-02 DIAGNOSIS — I4891 Unspecified atrial fibrillation: Secondary | ICD-10-CM | POA: Diagnosis not present

## 2018-09-02 DIAGNOSIS — R2689 Other abnormalities of gait and mobility: Secondary | ICD-10-CM | POA: Diagnosis not present

## 2018-09-02 DIAGNOSIS — I5042 Chronic combined systolic (congestive) and diastolic (congestive) heart failure: Secondary | ICD-10-CM | POA: Diagnosis not present

## 2018-09-02 DIAGNOSIS — F0391 Unspecified dementia with behavioral disturbance: Secondary | ICD-10-CM | POA: Diagnosis not present

## 2018-09-04 ENCOUNTER — Non-Acute Institutional Stay: Payer: Medicare Other | Admitting: Primary Care

## 2018-09-04 DIAGNOSIS — I495 Sick sinus syndrome: Secondary | ICD-10-CM

## 2018-09-04 DIAGNOSIS — Z515 Encounter for palliative care: Secondary | ICD-10-CM

## 2018-09-04 DIAGNOSIS — I5032 Chronic diastolic (congestive) heart failure: Secondary | ICD-10-CM | POA: Diagnosis not present

## 2018-09-04 NOTE — Progress Notes (Signed)
[facility] Telephone: [phone] Fax: [phone]  PATIENT NAME: [name] DOB: April 04, 1925 MRN: [id number]  PRIMARY CARE PROVIDER:   Marton Redwood, MD  REFERRING PROVIDER:  Marton Redwood, MD [address] [place], [place] [address]  RESPONSIBLE PARTY:   Extended Emergency Contact Information Primary Emergency Contact: [name] Address: [address]          [place], [place] [address] Johnnette Litter of [place] Phone: [phone] Mobile Phone: [phone] Relation: Spouse   ASSESSMENT and RECOMMENDATIONS:   1. Fluid Burden: Much improved. Slight to no pedal edema now, wrapped with coban or similar dressing which is staying place and applying light compression. Mild rales in bases. Maintaining well on current diuretic therapy. Continue.  2. Fall Risk: Denies falls. States she waits for help before changing positions. Risk still remains high due to mentation, weakness and DOE. In w/c for ambulation and safety.    3. Goals of Care: Has DNR in chart. Husband is Media planner. Staff states no changes in goals of care.   Continue to follow for palliative care, goals of care and symptom management. Return 2 months or prn.  I spent 25 minutes providing this consultation,  from 1030 to 1055.  More than 50% of the time in this consultation was spent coordinating communication.   HISTORY OF PRESENT ILLNESS:  [name] is a 84 y.o. year old female with multiple medical problems including HTN, HLD, a fibrillation recurrent, Diastolic HFpEF,pacemaker, tachy/brady syndrome, OA, fatigue. Palliative Care was asked to help address goals of care.   CODE STATUS: DNR  PPS: 30% HOSPICE ELIGIBILITY/DIAGNOSIS: TBD  PAST MEDICAL HISTORY:  Past Medical History:  Diagnosis Date  . (HFpEF) heart failure with preserved ejection fraction ([place])   . Arthritis   . Atrial fibrillation [facility])    Prior TEE/CV in May 2012; managed with rate control and anticoagulation  .  Back pain    CHRONIC  . Dysphagia   . GERD (gastroesophageal reflux disease)   . Hiatal hernia   . Hypertension   . Hyponatremia    improved  . Osteopenia   . Pacemaker   . Pneumonia 04/2010  . Renal artery stenosis ([place]) 2008   STATUS POST ANGIOPLASTY  . Seizure disorder ([place])    on anti-epileptic therapy  . SIADH (syndrome of inappropriate ADH production) ([place])    h/o HYPONATREMIA ; LIKELY RELATED TO HER LUNG PROCESS  . Tachy-brady syndrome [facility])    with pauses noted in May 2012; s/p PTVP October 2012  . Urinary incontinence     SOCIAL HX:  Social History   Tobacco Use  . Smoking status: Never Smoker  . Smokeless tobacco: Never Used  Substance Use Topics  . Alcohol use: No    ALLERGIES:  Allergies  Allergen Reactions  . Amiodarone     Possibly caused pneumonitis     PERTINENT MEDICATIONS:  Outpatient Encounter Medications as of 09/04/2018  Medication Sig  . BENICAR 40 MG tablet TAKE 1 TABLET DAILY  . Calcium Citrate (CITRACAL PO) Take 1 tablet by mouth 2 (two) times daily.   . cholecalciferol (VITAMIN D) 1000 units tablet Take 400 Units by mouth daily.   . divalproex (DEPAKOTE) 500 MG EC tablet Take 500 mg by mouth at bedtime.    . dofetilide (TIKOSYN) 250 MCG capsule TAKE 1 CAPSULE TWICE A DAY  . dorzolamide-timolol (COSOPT) 22.3-6.8 MG/ML ophthalmic solution Put one drop in right eye twice a day (use as directed)  .  furosemide (LASIX) 40 MG tablet TAKE 1 TABLET DAILY  . KLOR-CON M10 10 MEQ tablet TAKE 1 TABLET TWICE A DAY  . LATANOPROST OP Place 1 drop into the right eye at bedtime.  . memantine (NAMENDA) 10 MG tablet Take 10 mg by mouth daily. Take 1 10 mg tablet in the mornings  . memantine (NAMENDA) 10 MG tablet Take 5 mg by mouth daily. Take 1/2 tablet at bedtime  . metoprolol tartrate (LOPRESSOR) 25 MG tablet TAKE 1 TABLET TWICE A DAY  . mirabegron ER (MYRBETRIQ) 50 MG TB24 tablet Take 50 mg by mouth daily.  . Multiple Vitamins-Minerals (MULTIVITAMINS  THER. W/MINERALS) TABS Take 1 tablet by mouth daily.    . pantoprazole (PROTONIX) 40 MG tablet Take 1 tablet by mouth daily.  . polyethylene glycol powder (GLYCOLAX/MIRALAX) powder Take 17 g by mouth daily as needed. For constipation  . PRADAXA 75 MG CAPS capsule TAKE 1 CAPSULE EVERY 12 HOURS  . prednisoLONE acetate (PRED FORTE) 1 % ophthalmic suspension Place 1 drop into the left eye 2 (two) times daily.  . Tiotropium Bromide Monohydrate (SPIRIVA RESPIMAT) 1.25 MCG/ACT AERS Inhale into the lungs at bedtime.  . VESICARE 5 MG tablet Take 5 mg by mouth daily.    No facility-administered encounter medications on file as of 09/04/2018.     PHYSICAL EXAM:  VS 126 lbs. 97.8-60-20 143/74 Wt. 126.2 General: NAD, frail appearing, thin Cardiovascular: regular rate and rhythm, S1S2 mumur. Pulmonary: rales in basilar fields, no cough Abdomen: soft, nontender, + bowel sounds, eating 75% Extremities: slight bil edema,  Wraps intact on LE, no joint deformities Skin: no rashes, no wounds. Skin very friable.  Neurological: Weakness , knows DOB, Oriented x 2-3. Denies falls . Hand tremor at rest. Cyndia Skeeters DNP, AGPCNP-BC

## 2018-09-09 DIAGNOSIS — R2689 Other abnormalities of gait and mobility: Secondary | ICD-10-CM | POA: Diagnosis not present

## 2018-09-09 DIAGNOSIS — I110 Hypertensive heart disease with heart failure: Secondary | ICD-10-CM | POA: Diagnosis not present

## 2018-09-09 DIAGNOSIS — I739 Peripheral vascular disease, unspecified: Secondary | ICD-10-CM | POA: Diagnosis not present

## 2018-09-09 DIAGNOSIS — I4891 Unspecified atrial fibrillation: Secondary | ICD-10-CM | POA: Diagnosis not present

## 2018-09-09 DIAGNOSIS — F0391 Unspecified dementia with behavioral disturbance: Secondary | ICD-10-CM | POA: Diagnosis not present

## 2018-09-09 DIAGNOSIS — I5042 Chronic combined systolic (congestive) and diastolic (congestive) heart failure: Secondary | ICD-10-CM | POA: Diagnosis not present

## 2018-09-16 DIAGNOSIS — R2689 Other abnormalities of gait and mobility: Secondary | ICD-10-CM | POA: Diagnosis not present

## 2018-09-16 DIAGNOSIS — I110 Hypertensive heart disease with heart failure: Secondary | ICD-10-CM | POA: Diagnosis not present

## 2018-09-16 DIAGNOSIS — F0391 Unspecified dementia with behavioral disturbance: Secondary | ICD-10-CM | POA: Diagnosis not present

## 2018-09-16 DIAGNOSIS — I4891 Unspecified atrial fibrillation: Secondary | ICD-10-CM | POA: Diagnosis not present

## 2018-09-16 DIAGNOSIS — I739 Peripheral vascular disease, unspecified: Secondary | ICD-10-CM | POA: Diagnosis not present

## 2018-09-16 DIAGNOSIS — I5042 Chronic combined systolic (congestive) and diastolic (congestive) heart failure: Secondary | ICD-10-CM | POA: Diagnosis not present

## 2018-09-18 DIAGNOSIS — Z8701 Personal history of pneumonia (recurrent): Secondary | ICD-10-CM | POA: Diagnosis not present

## 2018-09-18 DIAGNOSIS — I4891 Unspecified atrial fibrillation: Secondary | ICD-10-CM | POA: Diagnosis not present

## 2018-09-18 DIAGNOSIS — J849 Interstitial pulmonary disease, unspecified: Secondary | ICD-10-CM | POA: Diagnosis not present

## 2018-09-18 DIAGNOSIS — Z7901 Long term (current) use of anticoagulants: Secondary | ICD-10-CM | POA: Diagnosis not present

## 2018-09-18 DIAGNOSIS — I5042 Chronic combined systolic (congestive) and diastolic (congestive) heart failure: Secondary | ICD-10-CM | POA: Diagnosis not present

## 2018-09-18 DIAGNOSIS — Z950 Presence of cardiac pacemaker: Secondary | ICD-10-CM | POA: Diagnosis not present

## 2018-09-18 DIAGNOSIS — I739 Peripheral vascular disease, unspecified: Secondary | ICD-10-CM | POA: Diagnosis not present

## 2018-09-18 DIAGNOSIS — R41841 Cognitive communication deficit: Secondary | ICD-10-CM | POA: Diagnosis not present

## 2018-09-18 DIAGNOSIS — I110 Hypertensive heart disease with heart failure: Secondary | ICD-10-CM | POA: Diagnosis not present

## 2018-09-18 DIAGNOSIS — F0390 Unspecified dementia without behavioral disturbance: Secondary | ICD-10-CM | POA: Diagnosis not present

## 2018-09-18 DIAGNOSIS — G40909 Epilepsy, unspecified, not intractable, without status epilepticus: Secondary | ICD-10-CM | POA: Diagnosis not present

## 2018-09-23 DIAGNOSIS — H401433 Capsular glaucoma with pseudoexfoliation of lens, bilateral, severe stage: Secondary | ICD-10-CM | POA: Diagnosis not present

## 2018-09-23 DIAGNOSIS — Z961 Presence of intraocular lens: Secondary | ICD-10-CM | POA: Diagnosis not present

## 2018-09-23 DIAGNOSIS — I110 Hypertensive heart disease with heart failure: Secondary | ICD-10-CM | POA: Diagnosis not present

## 2018-09-23 DIAGNOSIS — J849 Interstitial pulmonary disease, unspecified: Secondary | ICD-10-CM | POA: Diagnosis not present

## 2018-09-23 DIAGNOSIS — F0390 Unspecified dementia without behavioral disturbance: Secondary | ICD-10-CM | POA: Diagnosis not present

## 2018-09-23 DIAGNOSIS — I739 Peripheral vascular disease, unspecified: Secondary | ICD-10-CM | POA: Diagnosis not present

## 2018-09-23 DIAGNOSIS — I4891 Unspecified atrial fibrillation: Secondary | ICD-10-CM | POA: Diagnosis not present

## 2018-09-23 DIAGNOSIS — I5042 Chronic combined systolic (congestive) and diastolic (congestive) heart failure: Secondary | ICD-10-CM | POA: Diagnosis not present

## 2018-09-25 DIAGNOSIS — F419 Anxiety disorder, unspecified: Secondary | ICD-10-CM | POA: Diagnosis not present

## 2018-09-25 DIAGNOSIS — F0391 Unspecified dementia with behavioral disturbance: Secondary | ICD-10-CM | POA: Diagnosis not present

## 2018-09-25 DIAGNOSIS — F39 Unspecified mood [affective] disorder: Secondary | ICD-10-CM | POA: Diagnosis not present

## 2018-09-30 DIAGNOSIS — J849 Interstitial pulmonary disease, unspecified: Secondary | ICD-10-CM | POA: Diagnosis not present

## 2018-09-30 DIAGNOSIS — I739 Peripheral vascular disease, unspecified: Secondary | ICD-10-CM | POA: Diagnosis not present

## 2018-09-30 DIAGNOSIS — I110 Hypertensive heart disease with heart failure: Secondary | ICD-10-CM | POA: Diagnosis not present

## 2018-09-30 DIAGNOSIS — I5042 Chronic combined systolic (congestive) and diastolic (congestive) heart failure: Secondary | ICD-10-CM | POA: Diagnosis not present

## 2018-09-30 DIAGNOSIS — I4891 Unspecified atrial fibrillation: Secondary | ICD-10-CM | POA: Diagnosis not present

## 2018-09-30 DIAGNOSIS — F0390 Unspecified dementia without behavioral disturbance: Secondary | ICD-10-CM | POA: Diagnosis not present

## 2018-10-03 DIAGNOSIS — R319 Hematuria, unspecified: Secondary | ICD-10-CM | POA: Diagnosis not present

## 2018-10-03 DIAGNOSIS — N390 Urinary tract infection, site not specified: Secondary | ICD-10-CM | POA: Diagnosis not present

## 2018-10-04 LAB — CUP PACEART REMOTE DEVICE CHECK
Battery Remaining Percentage: 35 %
Battery Voltage: 2.83 V
Brady Statistic AP VP Percent: 16 %
Brady Statistic AP VS Percent: 83 %
Brady Statistic RA Percent Paced: 93 %
Brady Statistic RV Percent Paced: 17 %
Date Time Interrogation Session: 20191126070007
Implantable Lead Implant Date: 20121016
Implantable Lead Implant Date: 20121016
Implantable Lead Location: 753860
Implantable Pulse Generator Implant Date: 20121016
Lead Channel Impedance Value: 680 Ohm
Lead Channel Pacing Threshold Amplitude: 1 V
Lead Channel Pacing Threshold Pulse Width: 0.5 ms
Lead Channel Sensing Intrinsic Amplitude: 0.4 mV
Lead Channel Setting Pacing Pulse Width: 0.5 ms
Lead Channel Setting Sensing Sensitivity: 2 mV
MDC IDC LEAD LOCATION: 753859
MDC IDC MSMT BATTERY REMAINING LONGEVITY: 38 mo
MDC IDC MSMT LEADCHNL RA IMPEDANCE VALUE: 410 Ohm
MDC IDC MSMT LEADCHNL RV PACING THRESHOLD AMPLITUDE: 0.5 V
MDC IDC MSMT LEADCHNL RV PACING THRESHOLD PULSEWIDTH: 0.5 ms
MDC IDC MSMT LEADCHNL RV SENSING INTR AMPL: 12 mV
MDC IDC SET LEADCHNL RA PACING AMPLITUDE: 2 V
MDC IDC SET LEADCHNL RV PACING AMPLITUDE: 2.5 V
MDC IDC STAT BRADY AS VP PERCENT: 1 %
MDC IDC STAT BRADY AS VS PERCENT: 1 %
Pulse Gen Model: 2210
Pulse Gen Serial Number: 7280874

## 2018-10-07 DIAGNOSIS — R2689 Other abnormalities of gait and mobility: Secondary | ICD-10-CM | POA: Diagnosis not present

## 2018-10-07 DIAGNOSIS — L608 Other nail disorders: Secondary | ICD-10-CM | POA: Diagnosis not present

## 2018-10-07 DIAGNOSIS — F0390 Unspecified dementia without behavioral disturbance: Secondary | ICD-10-CM | POA: Diagnosis not present

## 2018-10-07 DIAGNOSIS — I4891 Unspecified atrial fibrillation: Secondary | ICD-10-CM | POA: Diagnosis not present

## 2018-10-07 DIAGNOSIS — I739 Peripheral vascular disease, unspecified: Secondary | ICD-10-CM | POA: Diagnosis not present

## 2018-10-07 DIAGNOSIS — I5042 Chronic combined systolic (congestive) and diastolic (congestive) heart failure: Secondary | ICD-10-CM | POA: Diagnosis not present

## 2018-10-07 DIAGNOSIS — I110 Hypertensive heart disease with heart failure: Secondary | ICD-10-CM | POA: Diagnosis not present

## 2018-10-07 DIAGNOSIS — J849 Interstitial pulmonary disease, unspecified: Secondary | ICD-10-CM | POA: Diagnosis not present

## 2018-10-07 DIAGNOSIS — M79673 Pain in unspecified foot: Secondary | ICD-10-CM | POA: Diagnosis not present

## 2018-10-14 DIAGNOSIS — I4891 Unspecified atrial fibrillation: Secondary | ICD-10-CM | POA: Diagnosis not present

## 2018-10-14 DIAGNOSIS — I5042 Chronic combined systolic (congestive) and diastolic (congestive) heart failure: Secondary | ICD-10-CM | POA: Diagnosis not present

## 2018-10-14 DIAGNOSIS — J849 Interstitial pulmonary disease, unspecified: Secondary | ICD-10-CM | POA: Diagnosis not present

## 2018-10-14 DIAGNOSIS — I739 Peripheral vascular disease, unspecified: Secondary | ICD-10-CM | POA: Diagnosis not present

## 2018-10-14 DIAGNOSIS — I110 Hypertensive heart disease with heart failure: Secondary | ICD-10-CM | POA: Diagnosis not present

## 2018-10-14 DIAGNOSIS — F0390 Unspecified dementia without behavioral disturbance: Secondary | ICD-10-CM | POA: Diagnosis not present

## 2018-10-18 DIAGNOSIS — I4891 Unspecified atrial fibrillation: Secondary | ICD-10-CM | POA: Diagnosis not present

## 2018-10-18 DIAGNOSIS — R41841 Cognitive communication deficit: Secondary | ICD-10-CM | POA: Diagnosis not present

## 2018-10-18 DIAGNOSIS — F0390 Unspecified dementia without behavioral disturbance: Secondary | ICD-10-CM | POA: Diagnosis not present

## 2018-10-18 DIAGNOSIS — I739 Peripheral vascular disease, unspecified: Secondary | ICD-10-CM | POA: Diagnosis not present

## 2018-10-18 DIAGNOSIS — I110 Hypertensive heart disease with heart failure: Secondary | ICD-10-CM | POA: Diagnosis not present

## 2018-10-18 DIAGNOSIS — I5042 Chronic combined systolic (congestive) and diastolic (congestive) heart failure: Secondary | ICD-10-CM | POA: Diagnosis not present

## 2018-10-18 DIAGNOSIS — J849 Interstitial pulmonary disease, unspecified: Secondary | ICD-10-CM | POA: Diagnosis not present

## 2018-10-18 DIAGNOSIS — Z950 Presence of cardiac pacemaker: Secondary | ICD-10-CM | POA: Diagnosis not present

## 2018-10-18 DIAGNOSIS — G40909 Epilepsy, unspecified, not intractable, without status epilepticus: Secondary | ICD-10-CM | POA: Diagnosis not present

## 2018-10-18 DIAGNOSIS — Z7901 Long term (current) use of anticoagulants: Secondary | ICD-10-CM | POA: Diagnosis not present

## 2018-10-18 DIAGNOSIS — Z8701 Personal history of pneumonia (recurrent): Secondary | ICD-10-CM | POA: Diagnosis not present

## 2018-10-21 DIAGNOSIS — I5042 Chronic combined systolic (congestive) and diastolic (congestive) heart failure: Secondary | ICD-10-CM | POA: Diagnosis not present

## 2018-10-21 DIAGNOSIS — I4891 Unspecified atrial fibrillation: Secondary | ICD-10-CM | POA: Diagnosis not present

## 2018-10-21 DIAGNOSIS — F0390 Unspecified dementia without behavioral disturbance: Secondary | ICD-10-CM | POA: Diagnosis not present

## 2018-10-21 DIAGNOSIS — I739 Peripheral vascular disease, unspecified: Secondary | ICD-10-CM | POA: Diagnosis not present

## 2018-10-21 DIAGNOSIS — J849 Interstitial pulmonary disease, unspecified: Secondary | ICD-10-CM | POA: Diagnosis not present

## 2018-10-21 DIAGNOSIS — I110 Hypertensive heart disease with heart failure: Secondary | ICD-10-CM | POA: Diagnosis not present

## 2018-10-23 ENCOUNTER — Encounter: Payer: TRICARE For Life (TFL) | Admitting: Primary Care

## 2018-10-25 ENCOUNTER — Non-Acute Institutional Stay: Payer: Medicare Other | Admitting: Primary Care

## 2018-10-25 DIAGNOSIS — I495 Sick sinus syndrome: Secondary | ICD-10-CM | POA: Diagnosis not present

## 2018-10-25 DIAGNOSIS — Z515 Encounter for palliative care: Secondary | ICD-10-CM

## 2018-10-25 DIAGNOSIS — I5032 Chronic diastolic (congestive) heart failure: Secondary | ICD-10-CM | POA: Diagnosis not present

## 2018-10-25 DIAGNOSIS — F39 Unspecified mood [affective] disorder: Secondary | ICD-10-CM | POA: Diagnosis not present

## 2018-10-25 DIAGNOSIS — F419 Anxiety disorder, unspecified: Secondary | ICD-10-CM | POA: Diagnosis not present

## 2018-10-25 DIAGNOSIS — F0391 Unspecified dementia with behavioral disturbance: Secondary | ICD-10-CM | POA: Diagnosis not present

## 2018-10-25 NOTE — Progress Notes (Signed)
[facility] Consult Note Telephone: [phone]  Fax: [phone]  PATIENT NAME: [name] DOB: 06/18/1925 MRN: [id number]  PRIMARY CARE PROVIDER:   Leonard Downing, MD  REFERRING PROVIDER:  Leonard Downing, MD [address] [place] [address] . RESPONSIBLE PARTY:   Extended Emergency Contact Information Primary Emergency Contact: Caroll,[name] Address: [address]          [place], [place] [address] [place] Phone: [phone] Mobile Phone: [phone] Relation: Spouse  Palliative Care sees patient by consultation request from Dr. Arelia Sneddon.  ASSESSMENT and RECOMMENDATIONS:   1. Increased agitation:  Psych NP has been seeing her, started on zoloft 25 mg. Staff states there has not seen a great difference. Psych will increase zoloft 50 mg  and then assess for seroquel if not controlled.   2. Goals for care:  No changes on MOST: DNR, limited hospitalization, IV long term, antibiotics, no feeding tube. Husband is visiting, reviewed MOST form and updated with husband.   3. Edema: Controlled with current medication regimen and compression. Last  BNP in 07/2018 was 1875. Renal fx gfr 60. Currently has wraps done to LE changed weekly. Reviewed technique to angle at 45 degrees when wrapping up to optimize venous return.   I spent 25 minutes providing this consultation,  from 1100 to 1130. More than 50% of the time in this consultation was spent coordinating communication.   HISTORY OF PRESENT ILLNESS:  [name] is a 85 y.o. year old female with multiple medical problems including HTN, HLD, a fibrillation recurrent, Diastolic HFpEF,pacemaker, tachy/brady syndrome, OA, fatigue.. Palliative Care was asked to help address goals of care.   CODE STATUS: DNR, limited MOST on file at ALF  PPS: 30% HOSPICE ELIGIBILITY/DIAGNOSIS: TBD  PAST MEDICAL HISTORY:  Past Medical History:  Diagnosis Date    . (HFpEF) heart failure with preserved ejection fraction ([place])   . Arthritis   . Atrial fibrillation [facility])    Prior TEE/CV in May 2012; managed with rate control and anticoagulation  . Back pain    CHRONIC  . Dysphagia   . GERD (gastroesophageal reflux disease)   . Hiatal hernia   . Hypertension   . Hyponatremia    improved  . Osteopenia   . Pacemaker   . Pneumonia 04/2010  . Renal artery stenosis ([facility]) 2008   STATUS POST ANGIOPLASTY  . Seizure disorder ([facility])    on anti-epileptic therapy  . SIADH (syndrome of inappropriate ADH production) ([facility])    h/o HYPONATREMIA ; LIKELY RELATED TO HER LUNG PROCESS  . Tachy-brady syndrome [facility])    with pauses noted in May 2012; s/p PTVP October 2012  . Urinary incontinence     SOCIAL HX:  Social History   Tobacco Use  . Smoking status: Never Smoker  . Smokeless tobacco: Never Used  Substance Use Topics  . Alcohol use: No    ALLERGIES:  Allergies  Allergen Reactions  . Amiodarone     Possibly caused pneumonitis     PERTINENT MEDICATIONS:  Outpatient Encounter Medications as of 10/25/2018  Medication Sig  . BENICAR 40 MG tablet TAKE 1 TABLET DAILY  . Calcium Citrate (CITRACAL PO) Take 1 tablet by mouth 2 (two) times daily.   . cholecalciferol (VITAMIN D) 1000 units tablet Take 400 Units by mouth daily.   . divalproex (DEPAKOTE) 500 MG EC tablet Take 500 mg by mouth at bedtime.    Marland Kitchen  dofetilide (TIKOSYN) 250 MCG capsule TAKE 1 CAPSULE TWICE A DAY  . dorzolamide-timolol (COSOPT) 22.3-6.8 MG/ML ophthalmic solution Put one drop in right eye twice a day (use as directed)  . furosemide (LASIX) 40 MG tablet TAKE 1 TABLET DAILY  . KLOR-CON M10 10 MEQ tablet TAKE 1 TABLET TWICE A DAY  . LATANOPROST OP Place 1 drop into the right eye at bedtime.  . memantine (NAMENDA) 10 MG tablet Take 10 mg by mouth daily. Take 1 10 mg tablet in the mornings  . memantine (NAMENDA) 10 MG tablet Take 5 mg by mouth daily. Take 1/2 tablet at bedtime  .  metoprolol tartrate (LOPRESSOR) 25 MG tablet TAKE 1 TABLET TWICE A DAY  . mirabegron ER (MYRBETRIQ) 50 MG TB24 tablet Take 50 mg by mouth daily.  . Multiple Vitamins-Minerals (MULTIVITAMINS THER. W/MINERALS) TABS Take 1 tablet by mouth daily.    . pantoprazole (PROTONIX) 40 MG tablet Take 1 tablet by mouth daily.  . polyethylene glycol powder (GLYCOLAX/MIRALAX) powder Take 17 g by mouth daily as needed. For constipation  . PRADAXA 75 MG CAPS capsule TAKE 1 CAPSULE EVERY 12 HOURS  . prednisoLONE acetate (PRED FORTE) 1 % ophthalmic suspension Place 1 drop into the left eye 2 (two) times daily.  . Tiotropium Bromide Monohydrate (SPIRIVA RESPIMAT) 1.25 MCG/ACT AERS Inhale into the lungs at bedtime.  . VESICARE 5 MG tablet Take 5 mg by mouth daily.    No facility-administered encounter medications on file as of 10/25/2018.     PHYSICAL EXAM:   VS 26 cm, 118/66  97.3-18- 61, Wt 126.4  General: NAD, frail appearing, WNWD Cardiovascular: regular rate and rhythm, S1S2 Pulmonary: clear all fields, no cough Abdomen: soft, nontender, + bowel sounds, loose bms. Extremities: no edema, no joint deformities Skin: no rashes, no wounds Neurological: Weakness but otherwise nonfocal, no falls, not using walker now and in w/c.  Cyndia Skeeters DNP, AGPCNP-BC

## 2018-10-30 ENCOUNTER — Non-Acute Institutional Stay: Payer: Medicare Other | Admitting: Licensed Clinical Social Worker

## 2018-10-30 DIAGNOSIS — Z515 Encounter for palliative care: Secondary | ICD-10-CM

## 2018-10-31 NOTE — Progress Notes (Addendum)
COMMUNITY PALLIATIVE CARE SW NOTE  PATIENT NAME: [name] DOB: 07-01-1925 MRN: [id number]  PRIMARY CARE PROVIDER: Ferd Hibbs, NP  RESPONSIBLE PARTY:  Acct ID - Guarantor Home Phone Work Phone Relationship Acct Type  [id number] [place] [phone] Self P/F     [address], [place], [place] [address]     PLAN OF CARE and INTERVENTIONS:             1. GOALS OF CARE/ ADVANCE CARE PLANNING:  Goal is to remain at the facility.  Patient has a DNR and MOST form in her chart.  MOST indicates limited interventions, use of ABT and IV and no feeding tube. 2. SOCIAL/EMOTIONAL/SPIRITUAL ASSESSMENT/ INTERVENTIONS:  SW met with patient in her room at [facility].  She was in her recliner in the dark because she said the light hurt her eyes.  Patient was able to state her age and birthday.  She knew her daughter and current husband, Hedy Camara.  Patient was born and raised in [place].  She has local church support.  Patient's daughter is currently out of the country on vacation, usually visit patient daily per staff.  Patient denied pain and engaged in conversation with SW. 3. PATIENT/CAREGIVER EDUCATION/ COPING:  SW provided education to patient RN, Caryl Asp, regarding patient receiving Palliative Care services.  She stated she understood.  Patient copes by answering questions appropriately. 4. PERSONAL EMERGENCY PLAN:  Per facility protocol. 5. COMMUNITY RESOURCES COORDINATION/ HEALTH CARE NAVIGATION:  None. 6. FINANCIAL/LEGAL CONCERNS/INTERVENTIONS:  None.     SOCIAL HX:  Social History   Tobacco Use  . Smoking status: Never Smoker  . Smokeless tobacco: Never Used  Substance Use Topics  . Alcohol use: No    CODE STATUS:  DNR  ADVANCED DIRECTIVES: N MOST FORM COMPLETE:  Y HOSPICE EDUCATION PROVIDED: N PPS:  Patient's appetite is normal per staff.  She can stand and pivot with assistance. Duration of visit and documentation:  45 minutes.      Creola Corn  Anneth Brunell, LCSW

## 2018-11-06 DIAGNOSIS — F419 Anxiety disorder, unspecified: Secondary | ICD-10-CM | POA: Diagnosis not present

## 2018-11-06 DIAGNOSIS — F39 Unspecified mood [affective] disorder: Secondary | ICD-10-CM | POA: Diagnosis not present

## 2018-11-06 DIAGNOSIS — F0391 Unspecified dementia with behavioral disturbance: Secondary | ICD-10-CM | POA: Diagnosis not present

## 2018-11-07 DIAGNOSIS — D692 Other nonthrombocytopenic purpura: Secondary | ICD-10-CM | POA: Diagnosis not present

## 2018-11-07 DIAGNOSIS — F0150 Vascular dementia without behavioral disturbance: Secondary | ICD-10-CM | POA: Diagnosis not present

## 2018-11-07 DIAGNOSIS — R569 Unspecified convulsions: Secondary | ICD-10-CM | POA: Diagnosis not present

## 2018-11-07 DIAGNOSIS — E708 Other disorders of aromatic amino-acid metabolism: Secondary | ICD-10-CM | POA: Diagnosis not present

## 2018-11-07 DIAGNOSIS — I10 Essential (primary) hypertension: Secondary | ICD-10-CM | POA: Diagnosis not present

## 2018-11-12 ENCOUNTER — Ambulatory Visit (INDEPENDENT_AMBULATORY_CARE_PROVIDER_SITE_OTHER): Payer: Medicare Other | Admitting: *Deleted

## 2018-11-12 DIAGNOSIS — I495 Sick sinus syndrome: Secondary | ICD-10-CM | POA: Diagnosis not present

## 2018-11-12 DIAGNOSIS — S81801D Unspecified open wound, right lower leg, subsequent encounter: Secondary | ICD-10-CM | POA: Diagnosis not present

## 2018-11-13 LAB — CUP PACEART REMOTE DEVICE CHECK
Battery Voltage: 2.83 V
Brady Statistic AP VP Percent: 12 %
Brady Statistic AP VS Percent: 87 %
Brady Statistic AS VP Percent: 1 %
Brady Statistic AS VS Percent: 1 %
Brady Statistic RV Percent Paced: 13 %
Implantable Lead Implant Date: 20121016
Implantable Lead Location: 753860
Lead Channel Impedance Value: 390 Ohm
Lead Channel Impedance Value: 710 Ohm
Lead Channel Pacing Threshold Amplitude: 0.5 V
Lead Channel Pacing Threshold Pulse Width: 0.5 ms
Lead Channel Sensing Intrinsic Amplitude: 12 mV
Lead Channel Setting Pacing Amplitude: 2 V
Lead Channel Setting Pacing Amplitude: 2.5 V
Lead Channel Setting Pacing Pulse Width: 0.5 ms
Lead Channel Setting Sensing Sensitivity: 2 mV
MDC IDC LEAD IMPLANT DT: 20121016
MDC IDC LEAD LOCATION: 753859
MDC IDC MSMT BATTERY REMAINING LONGEVITY: 38 mo
MDC IDC MSMT BATTERY REMAINING PERCENTAGE: 35 %
MDC IDC MSMT LEADCHNL RA PACING THRESHOLD AMPLITUDE: 1 V
MDC IDC MSMT LEADCHNL RA PACING THRESHOLD PULSEWIDTH: 0.5 ms
MDC IDC MSMT LEADCHNL RA SENSING INTR AMPL: 0.4 mV
MDC IDC PG IMPLANT DT: 20121016
MDC IDC PG SERIAL: 7280874
MDC IDC SESS DTM: 20200225075943
MDC IDC STAT BRADY RA PERCENT PACED: 94 %

## 2018-11-18 ENCOUNTER — Non-Acute Institutional Stay: Payer: Medicare Other | Admitting: Adult Health Nurse Practitioner

## 2018-11-18 DIAGNOSIS — Z515 Encounter for palliative care: Secondary | ICD-10-CM

## 2018-11-18 NOTE — Progress Notes (Signed)
[facility] Consult Note Telephone: [phone]  Fax: [phone] 11/18/2018  PATIENT NAME: [name] DOB: 12/05/1924 MRN: [id number]  PRIMARY CARE PROVIDER:   Ferd Hibbs, NP  REFERRING PROVIDER:  Ferd Hibbs, NP [name], [name] [id number]  RESPONSIBLE PARTY:   Extended Emergency Contact Information Primary Emergency Contact: [name] Address: [address]          [place], [place] [address] [name] of [place] Phone: [phone] Mobile Phone: [phone] Relation: Spouse        RECOMMENDATIONS and PLAN:  1. Weakness.  Patient has had a decline in mobility over the past 2 weeks.  She used to be able to stand and pivot and assist with transfers and now it is taking 2 staff members to assist with transfers.  Staff and family report that PT has done all they can do with her.  2. Dementia.  Patient is becoming more beligerant with staff and family.  She has been weaned off her zoloft and is currently on seroquel 25 mg BID and 50 mg QHS.  Patient is also on Nemantine, depakote, and lorazepam PRN.  Patient is being followed by psych provider.  She had a a recent ammonia and valproic acid level, which were WNL, to evaluate for cause of behavior change. Ordered CBC and CMP to check for any metabolic changes causing her weakness and behavior change.  3. Appetite.  Staff reports that she is not eating as well and requiring more cueing and assistance with meals.  Daughter and staff do report that with her weakness she is having problems getting the food to her mouth.  On 06/27/18 albumin was 3.7 and protein 7.0.  On 08/01/18 albumin was 3.0 and protein 5.9.  Her current weight is 125 without any recent significant weight changes.  Recommend weekly weights to monitor for any weight loss.  4.  Goals of Care. DNR is in effect.  Have discussed with family checking blood work to make sure there isn't something, such  as an electrolyte imbalance, that needs to be corrected as possible cause of weakness and behavior change.  Also discussed that if blood work is unremarkable that she is going have to be moved to SNF for her safety and staff safety with transfers and help with ADLs.  I spent 60 minutes providing this consultation,  From 3:00  to 4:00. More than 50% of the time in this consultation was spent coordinating communication.   HISTORY OF PRESENT ILLNESS:  [name] is a 85 y.o. year old female with multiple medical problems including HTN, HLD, a-fib, diastolic CHF, OA. Palliative Care was asked to help address goals of care.   CODE STATUS: DNR  PPS: 30% HOSPICE ELIGIBILITY/DIAGNOSIS: TBD  PHYSICAL EXAM:   General: patient is sitting in recliner in NAD Cardiovascular: regular rate and rhythm Pulmonary: lung sounds clear, normal respiratory effort Abdomen: soft, nontender, + bowel sounds GU: no suprapubic tenderness Extremities: no edema, Patient does have ACE wraps to lower legs for edema Neurological: Weakness but otherwise nonfocal   PAST MEDICAL HISTORY:  Past Medical History:  Diagnosis Date  . (HFpEF) heart failure with preserved ejection fraction ([facility])   . Arthritis   . Atrial fibrillation [facility])    Prior TEE/CV in May 2012; managed with rate control and anticoagulation  . Back pain    CHRONIC  . Dysphagia   . GERD (gastroesophageal reflux disease)   . Hiatal hernia   .  Hypertension   . Hyponatremia    improved  . Osteopenia   . Pacemaker   . Pneumonia 04/2010  . Renal artery stenosis ([facility]) 2008   STATUS POST ANGIOPLASTY  . Seizure disorder ([facility])    on anti-epileptic therapy  . SIADH (syndrome of inappropriate ADH production) ([place])    h/o HYPONATREMIA ; LIKELY RELATED TO HER LUNG PROCESS  . Tachy-brady syndrome [facility])    with pauses noted in May 2012; s/p PTVP October 2012  . Urinary incontinence     SOCIAL HX:  Social History   Tobacco Use  . Smoking status:  Never Smoker  . Smokeless tobacco: Never Used  Substance Use Topics  . Alcohol use: No    ALLERGIES:  Allergies  Allergen Reactions  . Amiodarone     Possibly caused pneumonitis     PERTINENT MEDICATIONS:  Outpatient Encounter Medications as of 11/18/2018  Medication Sig  . BENICAR 40 MG tablet TAKE 1 TABLET DAILY  . Calcium Citrate (CITRACAL PO) Take 1 tablet by mouth 2 (two) times daily.   . cholecalciferol (VITAMIN D) 1000 units tablet Take 400 Units by mouth daily.   . divalproex (DEPAKOTE) 500 MG EC tablet Take 500 mg by mouth at bedtime.    . dofetilide (TIKOSYN) 250 MCG capsule TAKE 1 CAPSULE TWICE A DAY  . dorzolamide-timolol (COSOPT) 22.3-6.8 MG/ML ophthalmic solution Put one drop in right eye twice a day (use as directed)  . furosemide (LASIX) 40 MG tablet TAKE 1 TABLET DAILY  . KLOR-CON M10 10 MEQ tablet TAKE 1 TABLET TWICE A DAY  . LATANOPROST OP Place 1 drop into the right eye at bedtime.  . memantine (NAMENDA) 10 MG tablet Take 10 mg by mouth daily. Take 1 10 mg tablet in the mornings  . memantine (NAMENDA) 10 MG tablet Take 5 mg by mouth daily. Take 1/2 tablet at bedtime  . metoprolol tartrate (LOPRESSOR) 25 MG tablet TAKE 1 TABLET TWICE A DAY  . mirabegron ER (MYRBETRIQ) 50 MG TB24 tablet Take 50 mg by mouth daily.  . Multiple Vitamins-Minerals (MULTIVITAMINS THER. W/MINERALS) TABS Take 1 tablet by mouth daily.    . pantoprazole (PROTONIX) 40 MG tablet Take 1 tablet by mouth daily.  . polyethylene glycol powder (GLYCOLAX/MIRALAX) powder Take 17 g by mouth daily as needed. For constipation  . PRADAXA 75 MG CAPS capsule TAKE 1 CAPSULE EVERY 12 HOURS  . prednisoLONE acetate (PRED FORTE) 1 % ophthalmic suspension Place 1 drop into the left eye 2 (two) times daily.  . Tiotropium Bromide Monohydrate (SPIRIVA RESPIMAT) 1.25 MCG/ACT AERS Inhale into the lungs at bedtime.  . VESICARE 5 MG tablet Take 5 mg by mouth daily.    No facility-administered encounter medications on  file as of 11/18/2018.      [name] Jenetta Downer, NP

## 2018-11-19 DIAGNOSIS — S81801D Unspecified open wound, right lower leg, subsequent encounter: Secondary | ICD-10-CM | POA: Diagnosis not present

## 2018-11-19 DIAGNOSIS — D649 Anemia, unspecified: Secondary | ICD-10-CM | POA: Diagnosis not present

## 2018-11-19 DIAGNOSIS — Z79899 Other long term (current) drug therapy: Secondary | ICD-10-CM | POA: Diagnosis not present

## 2018-11-20 ENCOUNTER — Encounter: Payer: Self-pay | Admitting: Cardiology

## 2018-11-20 DIAGNOSIS — F419 Anxiety disorder, unspecified: Secondary | ICD-10-CM | POA: Diagnosis not present

## 2018-11-20 DIAGNOSIS — F39 Unspecified mood [affective] disorder: Secondary | ICD-10-CM | POA: Diagnosis not present

## 2018-11-20 DIAGNOSIS — F0391 Unspecified dementia with behavioral disturbance: Secondary | ICD-10-CM | POA: Diagnosis not present

## 2018-11-20 NOTE — Progress Notes (Signed)
Remote pacemaker transmission.   

## 2018-11-25 ENCOUNTER — Inpatient Hospital Stay (HOSPITAL_COMMUNITY)
Admission: EM | Admit: 2018-11-25 | Discharge: 2018-11-30 | DRG: 689 | Disposition: A | Discharge status: Hospice | Payer: Medicare Other | Attending: Internal Medicine | Admitting: Internal Medicine

## 2018-11-25 ENCOUNTER — Encounter (HOSPITAL_COMMUNITY): Payer: Self-pay | Admitting: Emergency Medicine

## 2018-11-25 DIAGNOSIS — R54 Age-related physical debility: Secondary | ICD-10-CM | POA: Diagnosis present

## 2018-11-25 DIAGNOSIS — F0391 Unspecified dementia with behavioral disturbance: Secondary | ICD-10-CM

## 2018-11-25 DIAGNOSIS — Z8249 Family history of ischemic heart disease and other diseases of the circulatory system: Secondary | ICD-10-CM

## 2018-11-25 DIAGNOSIS — Z7901 Long term (current) use of anticoagulants: Secondary | ICD-10-CM

## 2018-11-25 DIAGNOSIS — N390 Urinary tract infection, site not specified: Principal | ICD-10-CM | POA: Diagnosis present

## 2018-11-25 DIAGNOSIS — F03918 Unspecified dementia, unspecified severity, with other behavioral disturbance: Secondary | ICD-10-CM | POA: Diagnosis present

## 2018-11-25 DIAGNOSIS — Z8042 Family history of malignant neoplasm of prostate: Secondary | ICD-10-CM

## 2018-11-25 DIAGNOSIS — F05 Delirium due to known physiological condition: Secondary | ICD-10-CM | POA: Diagnosis present

## 2018-11-25 DIAGNOSIS — Z823 Family history of stroke: Secondary | ICD-10-CM

## 2018-11-25 DIAGNOSIS — I5032 Chronic diastolic (congestive) heart failure: Secondary | ICD-10-CM | POA: Diagnosis present

## 2018-11-25 DIAGNOSIS — Z950 Presence of cardiac pacemaker: Secondary | ICD-10-CM | POA: Diagnosis present

## 2018-11-25 DIAGNOSIS — N179 Acute kidney failure, unspecified: Secondary | ICD-10-CM | POA: Diagnosis not present

## 2018-11-25 DIAGNOSIS — K219 Gastro-esophageal reflux disease without esophagitis: Secondary | ICD-10-CM | POA: Diagnosis present

## 2018-11-25 DIAGNOSIS — E440 Moderate protein-calorie malnutrition: Secondary | ICD-10-CM | POA: Diagnosis not present

## 2018-11-25 DIAGNOSIS — I83009 Varicose veins of unspecified lower extremity with ulcer of unspecified site: Secondary | ICD-10-CM | POA: Diagnosis present

## 2018-11-25 DIAGNOSIS — J069 Acute upper respiratory infection, unspecified: Secondary | ICD-10-CM | POA: Diagnosis not present

## 2018-11-25 DIAGNOSIS — L89892 Pressure ulcer of other site, stage 2: Secondary | ICD-10-CM | POA: Diagnosis present

## 2018-11-25 DIAGNOSIS — E860 Dehydration: Secondary | ICD-10-CM | POA: Diagnosis present

## 2018-11-25 DIAGNOSIS — I872 Venous insufficiency (chronic) (peripheral): Secondary | ICD-10-CM | POA: Diagnosis present

## 2018-11-25 DIAGNOSIS — I495 Sick sinus syndrome: Secondary | ICD-10-CM | POA: Diagnosis present

## 2018-11-25 DIAGNOSIS — I480 Paroxysmal atrial fibrillation: Secondary | ICD-10-CM

## 2018-11-25 DIAGNOSIS — R1310 Dysphagia, unspecified: Secondary | ICD-10-CM

## 2018-11-25 DIAGNOSIS — Z8052 Family history of malignant neoplasm of bladder: Secondary | ICD-10-CM

## 2018-11-25 DIAGNOSIS — R0902 Hypoxemia: Secondary | ICD-10-CM | POA: Diagnosis not present

## 2018-11-25 DIAGNOSIS — M8580 Other specified disorders of bone density and structure, unspecified site: Secondary | ICD-10-CM | POA: Diagnosis present

## 2018-11-25 DIAGNOSIS — I4891 Unspecified atrial fibrillation: Secondary | ICD-10-CM | POA: Diagnosis present

## 2018-11-25 DIAGNOSIS — I10 Essential (primary) hypertension: Secondary | ICD-10-CM | POA: Diagnosis present

## 2018-11-25 DIAGNOSIS — G40909 Epilepsy, unspecified, not intractable, without status epilepticus: Secondary | ICD-10-CM | POA: Diagnosis present

## 2018-11-25 DIAGNOSIS — E039 Hypothyroidism, unspecified: Secondary | ICD-10-CM | POA: Diagnosis present

## 2018-11-25 DIAGNOSIS — I959 Hypotension, unspecified: Secondary | ICD-10-CM | POA: Diagnosis not present

## 2018-11-25 DIAGNOSIS — G9341 Metabolic encephalopathy: Secondary | ICD-10-CM | POA: Diagnosis present

## 2018-11-25 DIAGNOSIS — G9340 Encephalopathy, unspecified: Secondary | ICD-10-CM | POA: Diagnosis present

## 2018-11-25 DIAGNOSIS — Z515 Encounter for palliative care: Secondary | ICD-10-CM | POA: Diagnosis present

## 2018-11-25 DIAGNOSIS — Z7401 Bed confinement status: Secondary | ICD-10-CM

## 2018-11-25 DIAGNOSIS — Z833 Family history of diabetes mellitus: Secondary | ICD-10-CM

## 2018-11-25 DIAGNOSIS — I110 Hypertensive heart disease with heart failure: Secondary | ICD-10-CM | POA: Diagnosis present

## 2018-11-25 DIAGNOSIS — Z841 Family history of disorders of kidney and ureter: Secondary | ICD-10-CM

## 2018-11-25 DIAGNOSIS — R531 Weakness: Secondary | ICD-10-CM | POA: Diagnosis not present

## 2018-11-25 DIAGNOSIS — Z66 Do not resuscitate: Secondary | ICD-10-CM | POA: Diagnosis present

## 2018-11-25 DIAGNOSIS — R000 Tachycardia, unspecified: Secondary | ICD-10-CM | POA: Diagnosis not present

## 2018-11-25 DIAGNOSIS — L97909 Non-pressure chronic ulcer of unspecified part of unspecified lower leg with unspecified severity: Secondary | ICD-10-CM | POA: Diagnosis present

## 2018-11-25 LAB — CBC WITH DIFFERENTIAL/PLATELET
Abs Immature Granulocytes: 0.1 10*3/uL — ABNORMAL HIGH (ref 0.00–0.07)
Basophils Absolute: 0.1 10*3/uL (ref 0.0–0.1)
Basophils Relative: 1 %
EOS PCT: 1 %
Eosinophils Absolute: 0.1 10*3/uL (ref 0.0–0.5)
HCT: 42.1 % (ref 36.0–46.0)
Hemoglobin: 13.6 g/dL (ref 12.0–15.0)
Immature Granulocytes: 1 %
Lymphocytes Relative: 16 %
Lymphs Abs: 1.3 10*3/uL (ref 0.7–4.0)
MCH: 33.7 pg (ref 26.0–34.0)
MCHC: 32.3 g/dL (ref 30.0–36.0)
MCV: 104.2 fL — AB (ref 80.0–100.0)
Monocytes Absolute: 0.9 10*3/uL (ref 0.1–1.0)
Monocytes Relative: 12 %
NEUTROS ABS: 5.5 10*3/uL (ref 1.7–7.7)
Neutrophils Relative %: 69 %
Platelets: 178 10*3/uL (ref 150–400)
RBC: 4.04 MIL/uL (ref 3.87–5.11)
RDW: 14.2 % (ref 11.5–15.5)
WBC: 8 10*3/uL (ref 4.0–10.5)
nRBC: 0 % (ref 0.0–0.2)

## 2018-11-25 LAB — COMPREHENSIVE METABOLIC PANEL
ALT: 13 U/L (ref 0–44)
AST: 33 U/L (ref 15–41)
Albumin: 2.7 g/dL — ABNORMAL LOW (ref 3.5–5.0)
Alkaline Phosphatase: 59 U/L (ref 38–126)
Anion gap: 7 (ref 5–15)
BUN: 50 mg/dL — ABNORMAL HIGH (ref 8–23)
CO2: 32 mmol/L (ref 22–32)
Calcium: 8.9 mg/dL (ref 8.9–10.3)
Chloride: 98 mmol/L (ref 98–111)
Creatinine, Ser: 1.55 mg/dL — ABNORMAL HIGH (ref 0.44–1.00)
GFR calc Af Amer: 34 mL/min — ABNORMAL LOW (ref >60)
GFR, EST NON AFRICAN AMERICAN: 29 mL/min — AB (ref >60)
Glucose, Bld: 80 mg/dL (ref 70–99)
Potassium: 4.9 mmol/L (ref 3.5–5.1)
Sodium: 137 mmol/L (ref 135–145)
TOTAL PROTEIN: 6.5 g/dL (ref 6.5–8.1)
Total Bilirubin: 1.2 mg/dL (ref 0.3–1.2)

## 2018-11-25 LAB — URINALYSIS, ROUTINE W REFLEX MICROSCOPIC
Bilirubin Urine: NEGATIVE
Glucose, UA: NEGATIVE mg/dL
Ketones, ur: NEGATIVE mg/dL
Nitrite: POSITIVE — AB
PROTEIN: NEGATIVE mg/dL
Specific Gravity, Urine: 1.01 (ref 1.005–1.030)
pH: 7.5 (ref 5.0–8.0)

## 2018-11-25 LAB — CREATININE, URINE, RANDOM: Creatinine, Urine: 28.62 mg/dL

## 2018-11-25 LAB — URINALYSIS, MICROSCOPIC (REFLEX)

## 2018-11-25 LAB — VALPROIC ACID LEVEL: VALPROIC ACID LVL: 52 ug/mL (ref 50.0–100.0)

## 2018-11-25 LAB — SODIUM, URINE, RANDOM: Sodium, Ur: 111 mmol/L

## 2018-11-25 MED ORDER — ALBUTEROL SULFATE (2.5 MG/3ML) 0.083% IN NEBU: 2.5000 mg | INHALATION_SOLUTION | RESPIRATORY_TRACT | Status: DC | PRN

## 2018-11-25 MED ORDER — ACETAMINOPHEN 650 MG RE SUPP: 650.0000 mg | Freq: Four times a day (QID) | RECTAL | Status: DC | PRN

## 2018-11-25 MED ORDER — MIRABEGRON ER 25 MG PO TB24
50.0000 mg | ORAL_TABLET | Freq: Every day | ORAL | Status: DC
  Administered 2018-11-26 – 2018-11-28 (×3): 50 mg via ORAL
  Filled 2018-11-25 (×4): qty 2

## 2018-11-25 MED ORDER — LORAZEPAM 0.5 MG PO TABS
0.5000 mg | ORAL_TABLET | Freq: Every day | ORAL | Status: DC | PRN
  Administered 2018-11-27: 0.5 mg via ORAL
  Filled 2018-11-25: qty 1

## 2018-11-25 MED ORDER — SODIUM CHLORIDE 0.9 % IV BOLUS
1000.0000 mL | Freq: Once | INTRAVENOUS | Status: AC
  Administered 2018-11-25: 1000 mL via INTRAVENOUS

## 2018-11-25 MED ORDER — HYDROCODONE-ACETAMINOPHEN 5-325 MG PO TABS
1.0000 | ORAL_TABLET | ORAL | Status: DC | PRN
  Administered 2018-11-26 – 2018-11-27 (×2): 1 via ORAL
  Filled 2018-11-25 (×2): qty 1

## 2018-11-25 MED ORDER — ONDANSETRON HCL 4 MG/2ML IJ SOLN: 4.0000 mg | Freq: Four times a day (QID) | INTRAMUSCULAR | Status: DC | PRN

## 2018-11-25 MED ORDER — SODIUM CHLORIDE 0.9 % IV SOLN: 1.0000 g | INTRAVENOUS | Status: DC

## 2018-11-25 MED ORDER — ACETAMINOPHEN 325 MG PO TABS
650.0000 mg | ORAL_TABLET | Freq: Four times a day (QID) | ORAL | Status: DC | PRN
  Administered 2018-11-29: 650 mg via ORAL
  Filled 2018-11-25: qty 2

## 2018-11-25 MED ORDER — DIVALPROEX SODIUM 125 MG PO CSDR
250.0000 mg | DELAYED_RELEASE_CAPSULE | Freq: Two times a day (BID) | ORAL | Status: DC
  Administered 2018-11-26 – 2018-11-28 (×5): 250 mg via ORAL
  Filled 2018-11-25 (×10): qty 2

## 2018-11-25 MED ORDER — SODIUM CHLORIDE 0.9 % IV SOLN
INTRAVENOUS | Status: AC
  Administered 2018-11-25: 23:00:00 via INTRAVENOUS

## 2018-11-25 MED ORDER — QUETIAPINE FUMARATE 25 MG PO TABS
25.0000 mg | ORAL_TABLET | Freq: Every evening | ORAL | Status: DC
  Administered 2018-11-26 – 2018-11-27 (×2): 25 mg via ORAL
  Filled 2018-11-25 (×3): qty 1

## 2018-11-25 MED ORDER — DIVALPROEX SODIUM 500 MG PO DR TAB
500.0000 mg | DELAYED_RELEASE_TABLET | Freq: Every day | ORAL | Status: DC
  Administered 2018-11-25 – 2018-11-29 (×5): 500 mg via ORAL
  Filled 2018-11-25 (×2): qty 2
  Filled 2018-11-25 (×4): qty 1
  Filled 2018-11-25: qty 2
  Filled 2018-11-25: qty 1

## 2018-11-25 MED ORDER — DORZOLAMIDE HCL-TIMOLOL MAL 2-0.5 % OP SOLN
1.0000 [drp] | Freq: Two times a day (BID) | OPHTHALMIC | Status: DC
  Administered 2018-11-25 – 2018-11-29 (×8): 1 [drp] via OPHTHALMIC
  Filled 2018-11-25: qty 10

## 2018-11-25 MED ORDER — DOFETILIDE 250 MCG PO CAPS
250.0000 ug | ORAL_CAPSULE | Freq: Two times a day (BID) | ORAL | Status: DC
  Administered 2018-11-25 – 2018-11-28 (×7): 250 ug via ORAL
  Filled 2018-11-25 (×8): qty 1

## 2018-11-25 MED ORDER — QUETIAPINE FUMARATE 50 MG PO TABS
50.0000 mg | ORAL_TABLET | Freq: Every day | ORAL | Status: DC
  Administered 2018-11-25 – 2018-11-29 (×5): 50 mg via ORAL
  Filled 2018-11-25 (×5): qty 1

## 2018-11-25 MED ORDER — DABIGATRAN ETEXILATE MESYLATE 75 MG PO CAPS
75.0000 mg | ORAL_CAPSULE | Freq: Two times a day (BID) | ORAL | Status: DC
  Administered 2018-11-25 – 2018-11-28 (×7): 75 mg via ORAL
  Filled 2018-11-25 (×8): qty 1

## 2018-11-25 MED ORDER — GUAIFENESIN ER 600 MG PO TB12
600.0000 mg | ORAL_TABLET | Freq: Two times a day (BID) | ORAL | Status: DC
  Administered 2018-11-25 – 2018-11-29 (×8): 600 mg via ORAL
  Filled 2018-11-25 (×8): qty 1

## 2018-11-25 MED ORDER — MEMANTINE HCL 10 MG PO TABS
10.0000 mg | ORAL_TABLET | Freq: Two times a day (BID) | ORAL | Status: DC
  Administered 2018-11-25 – 2018-11-28 (×7): 10 mg via ORAL
  Filled 2018-11-25 (×7): qty 1

## 2018-11-25 MED ORDER — FESOTERODINE FUMARATE ER 4 MG PO TB24
4.0000 mg | ORAL_TABLET | Freq: Every day | ORAL | Status: DC
  Administered 2018-11-26 – 2018-11-28 (×3): 4 mg via ORAL
  Filled 2018-11-25 (×4): qty 1

## 2018-11-25 MED ORDER — LATANOPROST 0.005 % OP SOLN
1.0000 [drp] | Freq: Every day | OPHTHALMIC | Status: DC
  Administered 2018-11-25 – 2018-11-29 (×5): 1 [drp] via OPHTHALMIC
  Filled 2018-11-25: qty 2.5

## 2018-11-25 MED ORDER — ONDANSETRON HCL 4 MG PO TABS: 4.0000 mg | ORAL_TABLET | Freq: Four times a day (QID) | ORAL | Status: DC | PRN

## 2018-11-25 MED ORDER — PANTOPRAZOLE SODIUM 40 MG PO TBEC
40.0000 mg | DELAYED_RELEASE_TABLET | Freq: Every day | ORAL | Status: DC
  Administered 2018-11-26 – 2018-11-28 (×3): 40 mg via ORAL
  Filled 2018-11-25 (×3): qty 1

## 2018-11-25 MED ORDER — METOPROLOL TARTRATE 25 MG PO TABS
25.0000 mg | ORAL_TABLET | Freq: Two times a day (BID) | ORAL | Status: DC
  Administered 2018-11-25 – 2018-11-28 (×7): 25 mg via ORAL
  Filled 2018-11-25 (×7): qty 1

## 2018-11-25 NOTE — ED Provider Notes (Signed)
   Face-to-face evaluation   History: She presents for evaluation after arriving at her skilled facility and falling asleep in her wheelchair.  Her husband had difficulty awakening her for about an hour therefore, she was transferred here by EMS for evaluation.  CBG was checked and was admitted, by EMS.  Patient is less responsive, more confused and has been hallucinating today according to family members.  No other recent illnesses.  Patient was last seen by palliative medicine, about a week ago and placed in a skilled nursing facility, today.  She is unable to give much history.  Physical exam: Elderly, alert, cooperative.  Oral mucous members are dry.  She is not tachycardic.  Lungs are clear anteriorly.  Extremities with fair range of motion, she has crepitation of the right shoulder with some pain during mobilization.  She is able to extend arms and legs off the stretcher symmetrically.  She is responsive and answers questions verbally.  She is confused.  Medical screening examination/treatment/procedure(s) were conducted as a shared visit with non-physician practitioner(s) and myself.  I personally evaluated the patient during the encounter     Daleen Bo, MD 11/27/18 1015

## 2018-11-25 NOTE — Progress Notes (Signed)
ATTEMPTED TO CALL ED BACK FOR REPORT WILL TRY AGAIN AT LATER TIME

## 2018-11-25 NOTE — H&P (Signed)
[name] WUJ:[id number] DOB: 1924-10-12 DOA: 11/25/2018     PCP: Ferd Hibbs, NP  Used to be [facility]:  CARDS:  Dr.JOrdan    Patient arrived to ER on 11/25/18 at 1348  Patient coming from:   From facility [facility] Chief Complaint:  Chief Complaint  Patient presents with  . Fatigue    HPI: [name] is a 85 y.o. female with medical history significant of Dementia, systolic CHF, atrial fibrillation, dysphasia, GERD, hiatal hernia, hypertension, hypothyroidism, status post pacemaker, seizure disorder, SIADH, tachybradycardia syndrome    Presented with   lethargy hallucinations generalized fatigue.  She has been like this for the past 3 to 4 weeks and been progressively getting worse  Patient history of dementia has been belligerent with staff and family she has been weaned off of his Zoloft and has been maintained on Seroquel as well as demented and Depakote and lorazepam as needed she has been followed by psychiatry. That has been declining she has been having progressive weakness and having trouble eating.  They had to move from assisted living facility to skilled nursing facility this morning. He has very hard time awakening her in the morning to eat breakfast but she has become alert throughout the day. No recent fevers or chills   Regarding pertinent Chronic problems:   tachy brady with PPM in place Recurrent atrial fib in June 2014 and was cardioverted and started on Multaq. Has been intolerant to amiodarone due to possible pulmonary toxicity. Then failed on Multaq due to recurrent arrhythmia. She has been  on Tikosyn. Pacer check in August showed AFib burden of 5.9%.  venous stasis ulcers  - LE arterial dopplers showed no significant disease. Managed conservatively.  While in ER: Found to have a stage II bedsore on her left lower extremity  UTI  The following Work up has been ordered so far:  Orders Placed This Encounter    Procedures  . Urine culture  . CBC with Differential  . Comprehensive metabolic panel  . Valproic acid level  . Urinalysis, Routine w reflex microscopic  . Urinalysis, Microscopic (reflex)  . Re-check Vital Signs  . Check temperature  . Offer food (encourage patient to eat)  . Offer Fluids  . Nursing Communication Urine spontaneous clean catch or in and out cath. No purwick  . Consult to hospitalist  . Inpatient consult to Social Work  . EKG 12-Lead  . ED EKG  . EKG 12-Lead     Following Medications were ordered in ER: Medications  sodium chloride 0.9 % bolus 1,000 mL (0 mLs Intravenous Stopped 11/25/18 1749)    Significant initial  Findings: Abnormal Labs Reviewed  CBC WITH DIFFERENTIAL/PLATELET - Abnormal; Notable for the following components:      Result Value   MCV 104.2 (*)    Abs Immature Granulocytes 0.10 (*)    All other components within normal limits  COMPREHENSIVE METABOLIC PANEL - Abnormal; Notable for the following components:   BUN 50 (*)    Creatinine, Ser 1.55 (*)    Albumin 2.7 (*)    GFR calc non Af Amer 29 (*)    GFR calc Af Amer 34 (*)    All other components within normal limits  URINALYSIS, ROUTINE W REFLEX MICROSCOPIC - Abnormal; Notable for the following components:   APPearance HAZY (*)    Hgb urine dipstick TRACE (*)    Nitrite POSITIVE (*)    Leukocytes,Ua LARGE (*)  All other components within normal limits  URINALYSIS, MICROSCOPIC (REFLEX) - Abnormal; Notable for the following components:   Bacteria, UA MANY (*)    All other components within normal limits     Lactic Acid, Venous No results found for: LATICACIDVEN  Na 137 K 4.9  Cr   Up from baseline see below Lab Results  Component Value Date   CREATININE 1.55 (H) 11/25/2018   CREATININE 0.86 11/12/2017   CREATININE 0.70 11/05/2015      WBC  8.0  HG/HCT  Stable,     Component Value Date/Time   HGB 13.6 11/25/2018 1539   HGB 12.7 11/12/2017 1508   HCT 42.1  11/25/2018 1539   HCT 38.1 11/12/2017 1508     valproic acid level 52   UA evidence of UTI        ECG:  Personally reviewed by me showing: HR : 60 Rhythm:  NSR,    no evidence of ischemic changes QTC 466      ED Triage Vitals  Enc Vitals Group     BP 11/25/18 1500 (!) 107/57     Pulse Rate 11/25/18 1438 (!) 59     Resp 11/25/18 1438 19     Temp 11/25/18 1535 (!) 97.4 F (36.3 C)     Temp Source 11/25/18 1535 Axillary     SpO2 11/25/18 1438 92 %     Weight --      Height --      Head Circumference --      Peak Flow --      Pain Score --      Pain Loc --      Pain Edu? --      Excl. in [place]? --   TMAX(24)@       Latest  Blood pressure (!) 168/73, pulse 63, temperature 98.4 F (36.9 C), temperature source Rectal, resp. rate 20, SpO2 100 %.    Hospitalist was called for admission for AKI and UTI    Review of Systems:    Pertinent positives include: confusion  Constitutional:  No weight loss, night sweats, Fevers, chills, fatigue, weight loss  HEENT:  No headaches, Difficulty swallowing,Tooth/dental problems,Sore throat,  No sneezing, itching, ear ache, nasal congestion, post nasal drip,  Cardio-vascular:  No chest pain, Orthopnea, PND, anasarca, dizziness, palpitations.no Bilateral lower extremity swelling  GI:  No heartburn, indigestion, abdominal pain, nausea, vomiting, diarrhea, change in bowel habits, loss of appetite, melena, blood in stool, hematemesis Resp:  no shortness of breath at rest. No dyspnea on exertion, No excess mucus, no productive cough, No non-productive cough, No coughing up of blood.No change in color of mucus.No wheezing. Skin:  no rash or lesions. No jaundice GU:  no dysuria, change in color of urine, no urgency or frequency. No straining to urinate.  No flank pain.  Musculoskeletal:  No joint pain or no joint swelling. No decreased range of motion. No back pain.  Psych:  No change in mood or affect. No depression or anxiety. No  memory loss.  Neuro: no localizing neurological complaints, no tingling, no weakness, no double vision, no gait abnormality, no slurred speech, no confusion  All systems reviewed and apart from [place] all are negative  Past Medical History:   Past Medical History:  Diagnosis Date  . (HFpEF) heart failure with preserved ejection fraction ([place])   . Arthritis   . Atrial fibrillation [facility])    Prior TEE/CV in May 2012; managed with rate control and anticoagulation  .  Back pain    CHRONIC  . Dysphagia   . GERD (gastroesophageal reflux disease)   . Hiatal hernia   . Hypertension   . Hyponatremia    improved  . Osteopenia   . Pacemaker   . Pneumonia 04/2010  . Renal artery stenosis ([place]) 2008   STATUS POST ANGIOPLASTY  . Seizure disorder ([place])    on anti-epileptic therapy  . SIADH (syndrome of inappropriate ADH production) ([place])    h/o HYPONATREMIA ; LIKELY RELATED TO HER LUNG PROCESS  . Tachy-brady syndrome [facility])    with pauses noted in May 2012; s/p PTVP October 2012  . Urinary incontinence       Past Surgical History:  Procedure Laterality Date  . APPENDECTOMY    . BACK SURGERY     X2  . CARDIAC CATHETERIZATION  2007   Normal  . CARDIOVERSION    . CARDIOVERSION  10/14/2012   Procedure: CARDIOVERSION;  Surgeon: Thayer Headings, MD;  Location: [facility] ENDOSCOPY;  Service: Cardiovascular;  Laterality: N/A;  . CARDIOVERSION N/A 05/30/2013   Procedure: CARDIOVERSION;  Surgeon: Thompson Grayer, MD;  Location: [place];  Service: Cardiovascular;  Laterality: N/A;  . CARDIOVERSION N/A 03/03/2013   Procedure: CARDIOVERSION;  Surgeon: Deboraha Sprang, MD;  Location: [facility] CATH LAB;  Service: Cardiovascular;  Laterality: N/A;  . CATARACT EXTRACTION    . CATARACT EXTRACTION Bilateral   . CHOLECYSTECTOMY    . KNEE ARTHROSCOPY Left   . PACEMAKER INSERTION  06/2011   SJM Accent DR RF implanted by Dr Rayann Heman  . PARTIAL HYSTERECTOMY    . RENAL ANGIOPLASTY Right 01/2007  . SHOULDER SURGERY    .  TONSILLECTOMY    . TOTAL KNEE ARTHROPLASTY Bilateral     Social History:  Ambulatory   bed bound     reports that she has never smoked. She has never used smokeless tobacco. She reports that she does not drink alcohol or use drugs.     Family History:   Family History  Problem Relation Age of Onset  . Stroke Mother   . Diabetes Mother   . Heart disease Mother        Unkown  . Stroke Father   . Diabetes Father   . Prostate cancer Father   . Kidney failure Brother   . Lung disease Brother   . Lung disease Sister   . Hypertension Daughter   . Hypertension Son   . Bladder Cancer Brother     Allergies: Allergies  Allergen Reactions  . Amiodarone     Possibly caused pneumonitis     Prior to Admission medications   Medication Sig Start Date End Date Taking? Authorizing Provider  BENICAR 40 MG tablet TAKE 1 TABLET DAILY 11/02/17  Yes Allred, Jeneen Rinks, MD  calcium carbonate (TUMS EX) 750 MG chewable tablet Chew 1 tablet by mouth 2 (two) times daily.   Yes [provider]  cholecalciferol (VITAMIN D) 1000 units tablet Take 400 Units by mouth daily.    Yes [provider]  divalproex (DEPAKOTE SPRINKLE) 125 MG capsule Take 250 mg by mouth 2 (two) times daily.   Yes [provider]  divalproex (DEPAKOTE) 500 MG DR tablet Take 500 mg by mouth at bedtime.   Yes [provider]  dofetilide (TIKOSYN) 250 MCG capsule TAKE 1 CAPSULE TWICE A DAY 01/19/17  Yes [place], Peter M, MD  dorzolamide-timolol (COSOPT) 22.3-6.8 MG/ML ophthalmic solution Put one drop in right eye twice a day (use as directed)  12/02/15  Yes [provider]  fesoterodine (TOVIAZ) 4 MG TB24 tablet Take 4 mg by mouth daily.   Yes [provider]  furosemide (LASIX) 40 MG tablet TAKE 1 TABLET DAILY 09/14/17  Yes Allred, Jeneen Rinks, MD  KLOR-CON M10 10 MEQ tablet TAKE 1 TABLET TWICE A DAY 11/28/16  Yes Allred, Jeneen Rinks, MD  LATANOPROST OP Place 1 drop into the right eye at  bedtime.   Yes [provider]  LORazepam (ATIVAN) 0.5 MG tablet Take 0.5 mg by mouth daily as needed for anxiety.   Yes [provider]  memantine (NAMENDA) 10 MG tablet Take 10 mg by mouth 2 (two) times daily.    Yes [provider]  metoprolol tartrate (LOPRESSOR) 25 MG tablet TAKE 1 TABLET TWICE A DAY 01/16/17  Yes [place], Peter M, MD  mirabegron ER (MYRBETRIQ) 50 MG TB24 tablet Take 50 mg by mouth daily.   Yes [provider]  Multiple Vitamins-Minerals (MULTIVITAMINS THER. W/MINERALS) TABS Take 1 tablet by mouth daily.     Yes [provider]  pantoprazole (PROTONIX) 40 MG tablet Take 1 tablet by mouth daily. 10/29/16  Yes [provider]  PRADAXA 75 MG CAPS capsule TAKE 1 CAPSULE EVERY 12 HOURS 11/08/17  Yes Allred, Jeneen Rinks, MD  QUEtiapine (SEROQUEL) 25 MG tablet Take 25 mg by mouth every evening.   Yes [provider]  QUEtiapine (SEROQUEL) 50 MG tablet Take 50 mg by mouth at bedtime.   Yes [provider]  spironolactone (ALDACTONE) 12.5 mg TABS tablet Take 12.5 mg by mouth daily. Monday, Wednesday, and friday   Yes [provider]  Tiotropium Bromide Monohydrate (SPIRIVA RESPIMAT) 1.25 MCG/ACT AERS Inhale 2 puffs into the lungs at bedtime as needed (copd).   Yes [provider]   Physical Exam: Blood pressure (!) 168/73, pulse 63, temperature 98.4 F (36.9 C), temperature source Rectal, resp. rate 20, SpO2 100 %. 1. General:  in No  Acute distress    Chronically ill  -appearing 2. Psychological: Alert and  Oriented to self not situation  3. Head/ENT:     Dry Mucous Membranes                          Head Non traumatic, neck supple                            Poor Dentition 4. SKIN:   decreased Skin turgor,  Skin clean Dry and intact no rash 5. Heart: Regular rate and rhythm no Murmur, no Rub or gallop 6. Lungs:   no wheezes or crackles   7. Abdomen: Soft,  non-tender, Non distended bowel sounds  present 8. Lower extremities: no clubbing, cyanosis, no edema 9. Neurologically Grossly intact, moving all 4 extremities equally   10. MSK: Normal range of motion   LABS:     Recent Labs  Lab 11/25/18 1539  WBC 8.0  NEUTROABS 5.5  HGB 13.6  HCT 42.1  MCV 104.2*  PLT 440   Basic Metabolic Panel: Recent Labs  Lab 11/25/18 1539  NA 137  K 4.9  CL 98  CO2 32  GLUCOSE 80  BUN 50*  CREATININE 1.55*  CALCIUM 8.9      Recent Labs  Lab 11/25/18 1539  AST 33  ALT 13  ALKPHOS 59  BILITOT 1.2  PROT 6.5  ALBUMIN 2.7*   No results for input(s): LIPASE, AMYLASE  in the last 168 hours. No results for input(s): AMMONIA in the last 168 hours.    HbA1C: No results for input(s): HGBA1C in the last 72 hours. CBG: No results for input(s): GLUCAP in the last 168 hours.    Urine analysis:    Component Value Date/Time   COLORURINE YELLOW 11/25/2018 1749   APPEARANCEUR HAZY (A) 11/25/2018 1749   LABSPEC 1.010 11/25/2018 1749   PHURINE 7.5 11/25/2018 1749   GLUCOSEU NEGATIVE 11/25/2018 1749   HGBUR TRACE (A) 11/25/2018 1749   BILIRUBINUR NEGATIVE 11/25/2018 1749   KETONESUR NEGATIVE 11/25/2018 1749   PROTEINUR NEGATIVE 11/25/2018 1749   UROBILINOGEN 0.2 07/14/2011 1408   NITRITE POSITIVE (A) 11/25/2018 1749   LEUKOCYTESUR LARGE (A) 11/25/2018 1749       Cultures:    Component Value Date/Time   SDES BLOOD LEFT HAND 09/14/2011 2345   SPECREQUEST BOTTLES DRAWN AEROBIC AND ANAEROBIC 10CC EACH 09/14/2011 2345   CULT NO GROWTH 5 DAYS 09/14/2011 2345   REPTSTATUS 09/21/2011 FINAL 09/14/2011 2345     Radiological Exams on Admission: No results found.  Chart has been reviewed    Assessment/Plan  85 y.o. female with medical history significant of Dementia, systolic CHF, atrial fibrillation, dysphasia, GERD, hiatal hernia, hypertension, hypothyroidism, status post pacemaker, seizure disorder, SIADH, tachybradycardia syndrome  Admitted for acute encephalopathy UTI  and dehydration  Present on Admission: . Acute lower UTI -  - treat with Rocephin       await results of urine culture and adjust antibiotic coverage as needed  . AKI (acute kidney injury) ([place]) -  likely secondary to dehydration rehydrate and follow kidney function Acute encephalopathy -   - most likely multifactorial secondary to combination of  infection  dehydration secondary to decreased by mouth intake,    - Will rehydrate   - treat underlining infection   -To assess if polypharmacy is playing a role as well difficult to manage patient given aggressive behavior in the past requiring multiple medications to manage   - neurological exam appears to be nonfocal but patient unable to cooperate fully    - no history of liver disease   Dementia -affect some degree of sundowning patient likely having end-stage dementia she is on multiple medications to help with management. . Hypertension -stable, hold Benicar Lasix and Spironolactone given  AKI . GERD (gastroesophageal reflux disease) -they will continue home medications . Atrial fibrillation ([place]) -           - CHA2DS2 vas score 4: continue current anticoagulation with pradaxa            -  Rate control:  Currently controlled with Toprolol,    will continue        - Rhythm control: Continue  Tikosyn  . Tachycardia-bradycardia [facility]) -status post pacemaker . Diastolic CHF, chronic (HCC) - - currently appears to be slightly on the dry side, hold home diuretics for tonight and restart when appears euvolemic, carefuly follow fluid status and Cr  . Pacemaker-St.Jude in place . Dehydration -we will rehydrate and follow        Other plan as per orders.  DVT prophylaxis:  pradaxa  Code Status:   DNR/DNI  as per family benefit from palliative care consult regarding overall goals of care discussion I had personally discussed CODE STATUS  With  Family     Family Communication:   Family  at  Bedside  plan of care was discussed with   Husband,    Disposition  Plan:                           Back to current facility when stable                                                              Swallow eval - SLP ordered                   Social Work  consulted                   Nutrition    consulted                                    Palliative care    consulted              Consults called: none    Admission status:   Obs    Level of care       medical floor     Toy Baker 11/25/2018, 9:14 PM    [facility]     after 2 AM please page floor coverage PA If 7AM-7PM, please contact the day team taking care of the patient using [email or web address]

## 2018-11-25 NOTE — ED Notes (Signed)
Attempted to call receiving RN. That RN will call back when they are available.

## 2018-11-25 NOTE — Progress Notes (Signed)
ED TO INPATIENT HANDOFF REPORT  ED Nurse Name and Phone #: Davy Pique [phone]  S Name/Age/Gender [name] 85 y.o. female Room/Bed: WA25/WA25  Code Status   Code Status: Prior  Home/SNF/Other SNF Patient oriented to: self Is this baseline? NO  Triage Complete: Triage complete  Chief Complaint weakness  Triage Note The patient is coming from [place] home due to lethargy, hallucinations, weakness and fidgeting. It was reported that she has been this way for 3-4 weeks but has worsened over that amount of time. Hallucinations are newer. Patient has  Pacemaker.   EMS vitals and CBG: 116/57 BP 66 HR 18 Resp Rate 90% O2 saturations on room air 199 CBG   Allergies Allergies  Allergen Reactions  . Amiodarone     Possibly caused pneumonitis    Level of Care/Admitting Diagnosis ED Disposition    ED Disposition Condition [facility] Area: [facility] [[id number]]  Level of Care: Med-Surg [16]  Diagnosis: Acute encephalopathy [010932]  Admitting Physician: Toy Baker [[id number]]  Attending Physician: Toy Baker [[id number]]  PT Class (Do Not Modify): Observation [104]  PT Acc Code (Do Not Modify): Observation [10022]       B Medical/Surgery History Past Medical History:  Diagnosis Date  . (HFpEF) heart failure with preserved ejection fraction ([place])   . Arthritis   . Atrial fibrillation [facility])    Prior TEE/CV in May 2012; managed with rate control and anticoagulation  . Back pain    CHRONIC  . Dysphagia   . GERD (gastroesophageal reflux disease)   . Hiatal hernia   . Hypertension   . Hyponatremia    improved  . Osteopenia   . Pacemaker   . Pneumonia 04/2010  . Renal artery stenosis ([place]) 2008   STATUS POST ANGIOPLASTY  . Seizure disorder ([place])    on anti-epileptic therapy  . SIADH (syndrome of inappropriate ADH production) ([place])    h/o HYPONATREMIA ; LIKELY RELATED TO HER LUNG PROCESS  . Tachy-brady syndrome  [facility])    with pauses noted in May 2012; s/p PTVP October 2012  . Urinary incontinence    Past Surgical History:  Procedure Laterality Date  . APPENDECTOMY    . BACK SURGERY     X2  . CARDIAC CATHETERIZATION  2007   Normal  . CARDIOVERSION    . CARDIOVERSION  10/14/2012   Procedure: CARDIOVERSION;  Surgeon: Thayer Headings, MD;  Location: [facility] ENDOSCOPY;  Service: Cardiovascular;  Laterality: N/A;  . CARDIOVERSION N/A 05/30/2013   Procedure: CARDIOVERSION;  Surgeon: Thompson Grayer, MD;  Location: [place];  Service: Cardiovascular;  Laterality: N/A;  . CARDIOVERSION N/A 03/03/2013   Procedure: CARDIOVERSION;  Surgeon: Deboraha Sprang, MD;  Location: [facility] CATH LAB;  Service: Cardiovascular;  Laterality: N/A;  . CATARACT EXTRACTION    . CATARACT EXTRACTION Bilateral   . CHOLECYSTECTOMY    . KNEE ARTHROSCOPY Left   . PACEMAKER INSERTION  06/2011   SJM Accent DR RF implanted by Dr Rayann Heman  . PARTIAL HYSTERECTOMY    . RENAL ANGIOPLASTY Right 01/2007  . SHOULDER SURGERY    . TONSILLECTOMY    . TOTAL KNEE ARTHROPLASTY Bilateral      A IV Location/Drains/Wounds Patient Lines/Drains/Airways Status   Active Line/Drains/Airways    Name:   Placement date:   Placement time:   Site:   Days:   Peripheral IV 02/28/13 Left Antecubital   02/28/13    1654    Antecubital  2096   Peripheral IV 11/25/18 Left Antecubital   11/25/18    -    Antecubital   less than 1   Incision 06/20/13 Arm Left   06/20/13    1541     1984   Pressure Ulcer 06/22/13 Stage II -  Partial thickness loss of dermis presenting as a shallow open ulcer with a red, pink wound bed without slough.   06/22/13    1925     1982          Intake/Output Last 24 hours  Intake/Output Summary (Last 24 hours) at 11/25/2018 2130 Last data filed at 11/25/2018 1749 Gross per 24 hour  Intake 1182.15 ml  Output -  Net 1182.15 ml    Labs/Imaging Results for orders placed or performed during the hospital encounter of 11/25/18 (from the past 48  hour(s))  CBC with Differential     Status: Abnormal   Collection Time: 11/25/18  3:39 PM  Result Value Ref Range   WBC 8.0 4.0 - 10.5 K/uL   RBC 4.04 3.87 - 5.11 MIL/uL   Hemoglobin 13.6 12.0 - 15.0 g/dL   HCT 42.1 36.0 - 46.0 %   MCV 104.2 (H) 80.0 - 100.0 fL   MCH 33.7 26.0 - 34.0 pg   MCHC 32.3 30.0 - 36.0 g/dL   RDW 14.2 11.5 - 15.5 %   Platelets 178 150 - 400 K/uL   nRBC 0.0 0.0 - 0.2 %   Neutrophils Relative % 69 %   Neutro Abs 5.5 1.7 - 7.7 K/uL   Lymphocytes Relative 16 %   Lymphs Abs 1.3 0.7 - 4.0 K/uL   Monocytes Relative 12 %   Monocytes Absolute 0.9 0.1 - 1.0 K/uL   Eosinophils Relative 1 %   Eosinophils Absolute 0.1 0.0 - 0.5 K/uL   Basophils Relative 1 %   Basophils Absolute 0.1 0.0 - 0.1 K/uL   Immature Granulocytes 1 %   Abs Immature Granulocytes 0.10 (H) 0.00 - 0.07 K/uL    Comment: Performed at [facility], [place] [address] [place], [place] [address]  Comprehensive metabolic panel     Status: Abnormal   Collection Time: 11/25/18  3:39 PM  Result Value Ref Range   Sodium 137 135 - 145 mmol/L   Potassium 4.9 3.5 - 5.1 mmol/L   Chloride 98 98 - 111 mmol/L   CO2 32 22 - 32 mmol/L   Glucose, Bld 80 70 - 99 mg/dL   BUN 50 (H) 8 - 23 mg/dL   Creatinine, Ser 1.55 (H) 0.44 - 1.00 mg/dL   Calcium 8.9 8.9 - 10.3 mg/dL   Total Protein 6.5 6.5 - 8.1 g/dL   Albumin 2.7 (L) 3.5 - 5.0 g/dL   AST 33 15 - 41 U/L   ALT 13 0 - 44 U/L   Alkaline Phosphatase 59 38 - 126 U/L   Total Bilirubin 1.2 0.3 - 1.2 mg/dL   GFR calc non Af Amer 29 (L) >60 mL/min   GFR calc Af Amer 34 (L) >60 mL/min   Anion gap 7 5 - 15    Comment: Performed at [facility], [place] [address] [place], [place] [address]  Valproic acid level     Status: None   Collection Time: 11/25/18  4:47 PM  Result Value Ref Range   Valproic Acid Lvl 52 50.0 - 100.0 ug/mL    Comment: Performed at [facility], [place] [address] [place], [place] [address]  Urinalysis, Routine w reflex microscopic     Status: Abnormal   Collection Time: 11/25/18  5:49 PM  Result Value Ref Range   Color, Urine YELLOW YELLOW   APPearance HAZY (A) CLEAR   Specific Gravity, Urine 1.010 1.005 - 1.030   pH 7.5 5.0 - 8.0   Glucose, UA NEGATIVE NEGATIVE mg/dL   Hgb urine dipstick TRACE (A) NEGATIVE   Bilirubin Urine NEGATIVE NEGATIVE   Ketones, ur NEGATIVE NEGATIVE mg/dL   Protein, ur NEGATIVE NEGATIVE mg/dL   Nitrite POSITIVE (A) NEGATIVE   Leukocytes,Ua LARGE (A) NEGATIVE    Comment: Performed at [facility], [place] [address], [place], [place] [address]  Urinalysis, Microscopic (reflex)     Status: Abnormal   Collection Time: 11/25/18  5:49 PM  Result Value Ref Range   RBC / HPF 0-5 0 - 5 RBC/hpf   WBC, UA 21-50 0 - 5 WBC/hpf   Bacteria, UA MANY (A) NONE SEEN   Squamous Epithelial / LPF 0-5 0 - 5   WBC Casts, UA PRESENT     Comment: Performed at [facility], [place] [address], [place], [place] [address]   No results found.  Pending Labs Unresulted Labs (From admission, onward)    Start     Ordered   11/26/18 0500  Prealbumin  Tomorrow morning,   R     11/25/18 2114   11/25/18 1911  Urine culture  ONCE - STAT,   STAT     11/25/18 1910   Signed and Held  Magnesium  Tomorrow morning,   R    Comments:  Call MD if <1.5    Signed and Held   Signed and Held  Phosphorus  Tomorrow morning,   R     Signed and Held   Signed and Held  TSH  Once,   R    Comments:  Cancel if already done within 1 month and notify MD    Signed and Held   Signed and Held  Comprehensive metabolic panel  Once,   R    Comments:  Cal MD for K<3.5 or >5.0    Signed and Held   Signed and Held  CBC  Once,   R    Comments:  Call for hg <8.0    Signed and Held   Signed and Held  Creatinine, urine, random  Once,   R     Signed and Held   Signed and Held  Sodium, urine, random  Once,   R     Signed and Held          Vitals/Pain Today's  Vitals   11/25/18 2000 11/25/18 2009 11/25/18 2030 11/25/18 2100  BP: (!) 168/73 (!) 168/73 (!) 151/66 (!) 151/96  Pulse:  63  60  Resp: 18 20 (!) 28 (!) 28  Temp:      TempSrc:      SpO2:  100%  100%    Isolation Precautions No active isolations  Medications Medications  sodium chloride 0.9 % bolus 1,000 mL (0 mLs Intravenous Stopped 11/25/18 1749)    Mobility walks with device     Focused Assessments Neuro Assessment Handoff:  Swallow screen pass?NA         Neuro Assessment: Exceptions to WDL Neuro Checks:      Last Documented NIHSS Modified Score:   Has TPA been given? NO If patient is a Neuro Trauma and patient is going to OR before floor call report to  4N Charge nurse: [phone] or [phone]     R Recommendations: See Admitting Provider Note  Report given to:   Additional Notes:  None

## 2018-11-25 NOTE — ED Notes (Signed)
Provided patient a [place] sandwich and coke.

## 2018-11-25 NOTE — ED Notes (Signed)
Report called to receiving RN.

## 2018-11-25 NOTE — ED Triage Notes (Signed)
The patient is coming from [place] home due to lethargy, hallucinations, weakness and fidgeting. It was reported that she has been this way for 3-4 weeks but has worsened over that amount of time. Hallucinations are newer. Patient has  Pacemaker.   EMS vitals and CBG: 116/57 BP 66 HR 18 Resp Rate 90% O2 saturations on room air 199 CBG

## 2018-11-25 NOTE — ED Provider Notes (Signed)
[place] DEPT Provider Note   CSN: [id number] Arrival date & time: 11/25/18  1348    History   Chief Complaint Chief Complaint  Patient presents with  . Fatigue    HPI [name] is a 85 y.o. female with history of dementia presenting today for worsening lethargy over the past 4 weeks.  Patient presents with her husband and daughter who are at bedside.  Per husband and daughter patient has been declining over the past 4 weeks.  They report that the patient has been, increasingly weak, intermittently confused and hard to awaken.  Patient was moved from an assisted living facility to a skilled nursing facility this morning.  Husband states that he took the patient to breakfast early this morning and she slept throughout the transfer down the hallway and he was having trouble awakening her at breakfast.  He states that this is why they brought the patient in today.  Husband reports that patient has become increasingly alert as the day has gone on.  They deny history of fever, chills, vomiting, change to urination, diarrhea or fall/injury or additional concerns.  Of note patient is DNR, recent palliative care visit on 11/18/2018 shows that patient was experiencing the same problems at that time.    HPI  Past Medical History:  Diagnosis Date  . (HFpEF) heart failure with preserved ejection fraction ([place])   . Arthritis   . Atrial fibrillation [facility])    Prior TEE/CV in May 2012; managed with rate control and anticoagulation  . Back pain    CHRONIC  . Dysphagia   . GERD (gastroesophageal reflux disease)   . Hiatal hernia   . Hypertension   . Hyponatremia    improved  . Osteopenia   . Pacemaker   . Pneumonia 04/2010  . Renal artery stenosis ([place]) 2008   STATUS POST ANGIOPLASTY  . Seizure disorder ([place])    on anti-epileptic therapy  . SIADH (syndrome of inappropriate ADH production) ([place])    h/o HYPONATREMIA ; LIKELY RELATED TO HER LUNG PROCESS    . Tachy-brady syndrome [facility])    with pauses noted in May 2012; s/p PTVP October 2012  . Urinary incontinence     Patient Active Problem List   Diagnosis Date Noted  . Dehydration 11/25/2018  . Acute lower UTI 11/25/2018  . AKI (acute kidney injury) ([place]) 11/25/2018  . Acute encephalopathy 11/25/2018  . Venous stasis ulcer ([place]) 11/25/2018  . HTN (hypertension) 05/05/2015  . Fatigue 11/02/2014  . Acute on chronic diastolic heart failure ([place]) 05/26/2013  . Pacemaker-St.Jude 07/03/2012  . Hyponatremia 09/18/2011  . Dysphagia 07/23/2011  . Anticoagulant long-term use 06/28/2011  . Tachycardia-bradycardia ([place]) 02/06/2011  . Diastolic CHF, chronic ([place]) 02/06/2011  . Atrial fibrillation ([place]) 02/01/2011  . Hypertension   . Renal artery stenosis ([place])   . GERD (gastroesophageal reflux disease)     Past Surgical History:  Procedure Laterality Date  . APPENDECTOMY    . BACK SURGERY     X2  . CARDIAC CATHETERIZATION  2007   Normal  . CARDIOVERSION    . CARDIOVERSION  10/14/2012   Procedure: CARDIOVERSION;  Surgeon: Thayer Headings, MD;  Location: [facility] ENDOSCOPY;  Service: Cardiovascular;  Laterality: N/A;  . CARDIOVERSION N/A 05/30/2013   Procedure: CARDIOVERSION;  Surgeon: Thompson Grayer, MD;  Location: [place];  Service: Cardiovascular;  Laterality: N/A;  . CARDIOVERSION N/A 03/03/2013   Procedure: CARDIOVERSION;  Surgeon: Deboraha Sprang, MD;  Location: [facility] CATH  LAB;  Service: Cardiovascular;  Laterality: N/A;  . CATARACT EXTRACTION    . CATARACT EXTRACTION Bilateral   . CHOLECYSTECTOMY    . KNEE ARTHROSCOPY Left   . PACEMAKER INSERTION  06/2011   SJM Accent DR RF implanted by Dr Rayann Heman  . PARTIAL HYSTERECTOMY    . RENAL ANGIOPLASTY Right 01/2007  . SHOULDER SURGERY    . TONSILLECTOMY    . TOTAL KNEE ARTHROPLASTY Bilateral      OB History   No obstetric history on file.      Home Medications    Prior to Admission medications   Medication Sig Start Date End Date Taking?  Authorizing Provider  BENICAR 40 MG tablet TAKE 1 TABLET DAILY 11/02/17  Yes Allred, Jeneen Rinks, MD  calcium carbonate (TUMS EX) 750 MG chewable tablet Chew 1 tablet by mouth 2 (two) times daily.   Yes [provider]  cholecalciferol (VITAMIN D) 1000 units tablet Take 400 Units by mouth daily.    Yes [provider]  divalproex (DEPAKOTE SPRINKLE) 125 MG capsule Take 250 mg by mouth 2 (two) times daily.   Yes [provider]  divalproex (DEPAKOTE) 500 MG DR tablet Take 500 mg by mouth at bedtime.   Yes [provider]  dofetilide (TIKOSYN) 250 MCG capsule TAKE 1 CAPSULE TWICE A DAY 01/19/17  Yes [place]ique, Peter M, MD  dorzolamide-timolol (COSOPT) 22.3-6.8 MG/ML ophthalmic solution Put one drop in right eye twice a day (use as directed) 12/02/15  Yes [provider]  fesoterodine (TOVIAZ) 4 MG TB24 tablet Take 4 mg by mouth daily.   Yes [provider]  furosemide (LASIX) 40 MG tablet TAKE 1 TABLET DAILY 09/14/17  Yes Allred, Jeneen Rinks, MD  KLOR-CON M10 10 MEQ tablet TAKE 1 TABLET TWICE A DAY 11/28/16  Yes Allred, Jeneen Rinks, MD  LATANOPROST OP Place 1 drop into the right eye at bedtime.   Yes [provider]  LORazepam (ATIVAN) 0.5 MG tablet Take 0.5 mg by mouth daily as needed for anxiety.   Yes [provider]  memantine (NAMENDA) 10 MG tablet Take 10 mg by mouth 2 (two) times daily.    Yes [provider]  metoprolol tartrate (LOPRESSOR) 25 MG tablet TAKE 1 TABLET TWICE A DAY 01/16/17  Yes [place]ique, Peter M, MD  mirabegron ER (MYRBETRIQ) 50 MG TB24 tablet Take 50 mg by mouth daily.   Yes [provider]  Multiple Vitamins-Minerals (MULTIVITAMINS THER. W/MINERALS) TABS Take 1 tablet by mouth daily.     Yes [provider]  pantoprazole (PROTONIX) 40 MG tablet Take 1 tablet by mouth daily. 10/29/16  Yes [provider]  PRADAXA 75 MG CAPS capsule TAKE 1 CAPSULE EVERY 12 HOURS 11/08/17  Yes Allred, Jeneen Rinks, MD    QUEtiapine (SEROQUEL) 25 MG tablet Take 25 mg by mouth every evening.   Yes [provider]  QUEtiapine (SEROQUEL) 50 MG tablet Take 50 mg by mouth at bedtime.   Yes [provider]  spironolactone (ALDACTONE) 12.5 mg TABS tablet Take 12.5 mg by mouth daily. Monday, Wednesday, and friday   Yes [provider]  Tiotropium Bromide Monohydrate (SPIRIVA RESPIMAT) 1.25 MCG/ACT AERS Inhale 2 puffs into the lungs at bedtime as needed (copd).   Yes [provider]    Family History Family History  Problem Relation Age of Onset  . Stroke Mother   . Diabetes Mother   . Heart disease Mother        Unkown  . Stroke  Father   . Diabetes Father   . Prostate cancer Father   . Kidney failure Brother   . Lung disease Brother   . Lung disease Sister   . Hypertension Daughter   . Hypertension Son   . Bladder Cancer Brother     Social History Social History   Tobacco Use  . Smoking status: Never Smoker  . Smokeless tobacco: Never Used  Substance Use Topics  . Alcohol use: No  . Drug use: No     Allergies   Amiodarone   Review of Systems Review of Systems  Unable to perform ROS: Dementia  Constitutional: Positive for fatigue. Negative for chills and fever.       Patient denies pain.  States that she feels well.  Gastrointestinal: Negative for nausea and vomiting.  Skin: Negative for wound.  Neurological: Positive for weakness (Generalized weakness). Negative for syncope.   Physical Exam Updated Vital Signs BP (!) 168/73 (BP Location: Left Arm)   Pulse 63   Temp 98.4 F (36.9 C) (Rectal)   Resp 20   SpO2 100%   Physical Exam Constitutional:      General: She is not in acute distress.    Appearance: She is not ill-appearing or toxic-appearing.     Comments: Frail  HENT:     Head: Normocephalic and atraumatic. No raccoon eyes, Battle's sign, abrasion or contusion.     Jaw: There is normal jaw occlusion.     Right Ear: External ear normal.      Left Ear: External ear normal.     Nose: Nose normal.     Mouth/Throat:     Lips: Pink.     Mouth: Mucous membranes are moist.     Pharynx: Oropharynx is clear.  Eyes:     General: Vision grossly intact. Gaze aligned appropriately.     Extraocular Movements: Extraocular movements intact.     Conjunctiva/sclera: Conjunctivae normal.     Pupils: Pupils are equal, round, and reactive to light.  Neck:     Musculoskeletal: Normal range of motion and neck supple. No spinous process tenderness or muscular tenderness.     Trachea: Trachea and phonation normal. No tracheal tenderness or tracheal deviation.  Cardiovascular:     Rate and Rhythm: Normal rate and regular rhythm.     Pulses: Normal pulses.          Dorsalis pedis pulses are 2+ on the right side and 2+ on the left side.       Posterior tibial pulses are 2+ on the right side and 2+ on the left side.     Heart sounds: Normal heart sounds.  Pulmonary:     Effort: Pulmonary effort is normal. No respiratory distress.     Breath sounds: Normal breath sounds and air entry. No wheezing or rhonchi.  Chest:     Chest wall: No deformity or tenderness.     Comments: Pacemaker Left Upper Chest. Abdominal:     General: Abdomen is flat. Bowel sounds are normal. There is no distension.     Palpations: Abdomen is soft.     Tenderness: There is no abdominal tenderness. There is no guarding or rebound.  Musculoskeletal: Normal range of motion.     Comments: Patient with appropriate range of motion for age at bilateral hips, knees, elbows, wrists and ankles. Family reports patient his history of right shoulder pain. Left should without pain.  No midline C/T/L spinal tenderness to palpation, no paraspinal muscle tenderness, no  deformity, crepitus, or step-off noted. No sign of injury to the neck or back.  No tenderness to compression of the hips bilaterally.  Feet:     Right foot:     Protective Sensation: 3 sites tested. 3 sites sensed.      Left foot:     Protective Sensation: 3 sites tested. 3 sites sensed.  Skin:    General: Skin is warm and dry.     Capillary Refill: Capillary refill takes less than 2 seconds.       Neurological:     Mental Status: She is alert.     GCS: GCS eye subscore is 4. GCS verbal subscore is 5. GCS motor subscore is 6.  Psychiatric:        Mood and Affect: Mood normal.    ED Treatments / Results  Labs (all labs ordered are listed, but only abnormal results are displayed) Labs Reviewed  CBC WITH DIFFERENTIAL/PLATELET - Abnormal; Notable for the following components:      Result Value   MCV 104.2 (*)    Abs Immature Granulocytes 0.10 (*)    All other components within normal limits  COMPREHENSIVE METABOLIC PANEL - Abnormal; Notable for the following components:   BUN 50 (*)    Creatinine, Ser 1.55 (*)    Albumin 2.7 (*)    GFR calc non Af Amer 29 (*)    GFR calc Af Amer 34 (*)    All other components within normal limits  URINALYSIS, ROUTINE W REFLEX MICROSCOPIC - Abnormal; Notable for the following components:   APPearance HAZY (*)    Hgb urine dipstick TRACE (*)    Nitrite POSITIVE (*)    Leukocytes,Ua LARGE (*)    All other components within normal limits  URINALYSIS, MICROSCOPIC (REFLEX) - Abnormal; Notable for the following components:   Bacteria, UA MANY (*)    All other components within normal limits  URINE CULTURE  VALPROIC ACID LEVEL    EKG EKG Interpretation  Date/Time:  Monday November 25 2018 15:24:52 EDT Ventricular Rate:  60 PR Interval:    QRS Duration: 160 QT Interval:  466 QTC Calculation: 466 R Axis:   123 Text Interpretation:  Atrial-paced rhythm Nonspecific intraventricular conduction delay Probable lateral infarct, age indeterminate Probable anteroseptal infarct, old Confirmed by Veryl Speak [id number]) on 11/25/2018 3:48:49 PM   Radiology No results found.  Procedures Procedures (including critical care time)  Medications Ordered in ED Medications    sodium chloride 0.9 % bolus 1,000 mL (0 mLs Intravenous Stopped 11/25/18 1749)     Initial Impression / Assessment and Plan / ED Course  I have reviewed the triage vital signs and the nursing notes.  Pertinent labs & imaging results that were available during my care of the patient were reviewed by me and considered in my medical decision making (see chart for details).    Physical exam shows chronic wound of right lower extremity, small laceration of left lower extremity.  No sign of fall or injury to the head, neck or back.  Patient with chronic right shoulder pain, no sign of acute injury. - CBC unremarkable CMP with elevated creatinine of 1.55, 1 year ago 0.86 Valproic acid level 52 EKG without acute findings reviewed with Dr. Stark Jock Urinalysis with positive nitrates, leukocytes and 21-50 white blood cells many bacteria suggestive of UTI, urine culture pending - IV fluids given  We have attempted to orally rehydrate the patient today this is been unsuccessful.  Based on present presentation  and family's concern we feel that patient will need admission for continued IV rehydration, antibiotics and monitoring.  Family updated care plan and is agreeable.  Consult called to hospitalist for admission. - Patient has been admitted to hospitalist service.    Patient was seen and evaluated during this visit by Dr. Eulis Foster.  Note: Portions of this report may have been transcribed using voice recognition software. Every effort was made to ensure accuracy; however, inadvertent computerized transcription errors may still be present. Final Clinical Impressions(s) / ED Diagnoses   Final diagnoses:  Dehydration  Urinary tract infection without hematuria, site unspecified    ED Discharge Orders    None       Gari Crown 11/25/18 2036    Daleen Bo, MD 11/27/18 1015

## 2018-11-26 ENCOUNTER — Other Ambulatory Visit: Payer: Self-pay

## 2018-11-26 DIAGNOSIS — Z841 Family history of disorders of kidney and ureter: Secondary | ICD-10-CM | POA: Diagnosis not present

## 2018-11-26 DIAGNOSIS — F0280 Dementia in other diseases classified elsewhere without behavioral disturbance: Secondary | ICD-10-CM | POA: Diagnosis not present

## 2018-11-26 DIAGNOSIS — R000 Tachycardia, unspecified: Secondary | ICD-10-CM | POA: Diagnosis not present

## 2018-11-26 DIAGNOSIS — Z8249 Family history of ischemic heart disease and other diseases of the circulatory system: Secondary | ICD-10-CM | POA: Diagnosis not present

## 2018-11-26 DIAGNOSIS — E039 Hypothyroidism, unspecified: Secondary | ICD-10-CM | POA: Diagnosis present

## 2018-11-26 DIAGNOSIS — Z7189 Other specified counseling: Secondary | ICD-10-CM

## 2018-11-26 DIAGNOSIS — I5032 Chronic diastolic (congestive) heart failure: Secondary | ICD-10-CM | POA: Diagnosis not present

## 2018-11-26 DIAGNOSIS — G9340 Encephalopathy, unspecified: Secondary | ICD-10-CM | POA: Diagnosis not present

## 2018-11-26 DIAGNOSIS — Z515 Encounter for palliative care: Secondary | ICD-10-CM | POA: Diagnosis not present

## 2018-11-26 DIAGNOSIS — K219 Gastro-esophageal reflux disease without esophagitis: Secondary | ICD-10-CM | POA: Diagnosis not present

## 2018-11-26 DIAGNOSIS — F05 Delirium due to known physiological condition: Secondary | ICD-10-CM | POA: Diagnosis present

## 2018-11-26 DIAGNOSIS — Z66 Do not resuscitate: Secondary | ICD-10-CM | POA: Diagnosis present

## 2018-11-26 DIAGNOSIS — E860 Dehydration: Secondary | ICD-10-CM | POA: Diagnosis not present

## 2018-11-26 DIAGNOSIS — I872 Venous insufficiency (chronic) (peripheral): Secondary | ICD-10-CM | POA: Diagnosis present

## 2018-11-26 DIAGNOSIS — I4891 Unspecified atrial fibrillation: Secondary | ICD-10-CM

## 2018-11-26 DIAGNOSIS — E440 Moderate protein-calorie malnutrition: Secondary | ICD-10-CM | POA: Diagnosis present

## 2018-11-26 DIAGNOSIS — G309 Alzheimer's disease, unspecified: Secondary | ICD-10-CM | POA: Diagnosis not present

## 2018-11-26 DIAGNOSIS — G9341 Metabolic encephalopathy: Secondary | ICD-10-CM | POA: Diagnosis present

## 2018-11-26 DIAGNOSIS — N390 Urinary tract infection, site not specified: Secondary | ICD-10-CM | POA: Diagnosis present

## 2018-11-26 DIAGNOSIS — I509 Heart failure, unspecified: Secondary | ICD-10-CM | POA: Diagnosis not present

## 2018-11-26 DIAGNOSIS — N189 Chronic kidney disease, unspecified: Secondary | ICD-10-CM | POA: Diagnosis not present

## 2018-11-26 DIAGNOSIS — R279 Unspecified lack of coordination: Secondary | ICD-10-CM | POA: Diagnosis not present

## 2018-11-26 DIAGNOSIS — R52 Pain, unspecified: Secondary | ICD-10-CM | POA: Diagnosis not present

## 2018-11-26 DIAGNOSIS — F0391 Unspecified dementia with behavioral disturbance: Secondary | ICD-10-CM | POA: Diagnosis not present

## 2018-11-26 DIAGNOSIS — Z743 Need for continuous supervision: Secondary | ICD-10-CM | POA: Diagnosis not present

## 2018-11-26 DIAGNOSIS — Z950 Presence of cardiac pacemaker: Secondary | ICD-10-CM | POA: Diagnosis not present

## 2018-11-26 DIAGNOSIS — G40909 Epilepsy, unspecified, not intractable, without status epilepticus: Secondary | ICD-10-CM | POA: Diagnosis present

## 2018-11-26 DIAGNOSIS — I10 Essential (primary) hypertension: Secondary | ICD-10-CM | POA: Diagnosis not present

## 2018-11-26 DIAGNOSIS — I130 Hypertensive heart and chronic kidney disease with heart failure and stage 1 through stage 4 chronic kidney disease, or unspecified chronic kidney disease: Secondary | ICD-10-CM | POA: Diagnosis not present

## 2018-11-26 DIAGNOSIS — I82409 Acute embolism and thrombosis of unspecified deep veins of unspecified lower extremity: Secondary | ICD-10-CM | POA: Diagnosis not present

## 2018-11-26 DIAGNOSIS — Z8042 Family history of malignant neoplasm of prostate: Secondary | ICD-10-CM | POA: Diagnosis not present

## 2018-11-26 DIAGNOSIS — Z8052 Family history of malignant neoplasm of bladder: Secondary | ICD-10-CM | POA: Diagnosis not present

## 2018-11-26 DIAGNOSIS — Z833 Family history of diabetes mellitus: Secondary | ICD-10-CM | POA: Diagnosis not present

## 2018-11-26 DIAGNOSIS — I110 Hypertensive heart disease with heart failure: Secondary | ICD-10-CM | POA: Diagnosis present

## 2018-11-26 DIAGNOSIS — Z7401 Bed confinement status: Secondary | ICD-10-CM | POA: Diagnosis not present

## 2018-11-26 DIAGNOSIS — N179 Acute kidney failure, unspecified: Secondary | ICD-10-CM | POA: Diagnosis present

## 2018-11-26 DIAGNOSIS — I495 Sick sinus syndrome: Secondary | ICD-10-CM | POA: Diagnosis present

## 2018-11-26 DIAGNOSIS — I739 Peripheral vascular disease, unspecified: Secondary | ICD-10-CM | POA: Diagnosis not present

## 2018-11-26 DIAGNOSIS — Z823 Family history of stroke: Secondary | ICD-10-CM | POA: Diagnosis not present

## 2018-11-26 LAB — COMPREHENSIVE METABOLIC PANEL
ALT: 12 U/L (ref 0–44)
AST: 21 U/L (ref 15–41)
Albumin: 2.3 g/dL — ABNORMAL LOW (ref 3.5–5.0)
Alkaline Phosphatase: 53 U/L (ref 38–126)
Anion gap: 4 — ABNORMAL LOW (ref 5–15)
BUN: 39 mg/dL — ABNORMAL HIGH (ref 8–23)
CO2: 30 mmol/L (ref 22–32)
Calcium: 8.5 mg/dL — ABNORMAL LOW (ref 8.9–10.3)
Chloride: 104 mmol/L (ref 98–111)
Creatinine, Ser: 1.26 mg/dL — ABNORMAL HIGH (ref 0.44–1.00)
GFR calc Af Amer: 43 mL/min — ABNORMAL LOW (ref >60)
GFR calc non Af Amer: 37 mL/min — ABNORMAL LOW (ref >60)
Glucose, Bld: 87 mg/dL (ref 70–99)
Potassium: 3.8 mmol/L (ref 3.5–5.1)
Sodium: 138 mmol/L (ref 135–145)
TOTAL PROTEIN: 5.7 g/dL — AB (ref 6.5–8.1)
Total Bilirubin: 0.7 mg/dL (ref 0.3–1.2)

## 2018-11-26 LAB — CBC
HCT: 39 % (ref 36.0–46.0)
Hemoglobin: 12.5 g/dL (ref 12.0–15.0)
MCH: 33.6 pg (ref 26.0–34.0)
MCHC: 32.1 g/dL (ref 30.0–36.0)
MCV: 104.8 fL — ABNORMAL HIGH (ref 80.0–100.0)
PLATELETS: 156 10*3/uL (ref 150–400)
RBC: 3.72 MIL/uL — ABNORMAL LOW (ref 3.87–5.11)
RDW: 14.4 % (ref 11.5–15.5)
WBC: 6.4 10*3/uL (ref 4.0–10.5)
nRBC: 0 % (ref 0.0–0.2)

## 2018-11-26 LAB — PREALBUMIN: PREALBUMIN: 15 mg/dL — AB (ref 18–38)

## 2018-11-26 LAB — PHOSPHORUS: Phosphorus: 3 mg/dL (ref 2.5–4.6)

## 2018-11-26 LAB — MRSA PCR SCREENING: MRSA by PCR: NEGATIVE

## 2018-11-26 LAB — MAGNESIUM: Magnesium: 2.1 mg/dL (ref 1.7–2.4)

## 2018-11-26 MED ORDER — POTASSIUM CHLORIDE CRYS ER 10 MEQ PO TBCR
10.0000 meq | EXTENDED_RELEASE_TABLET | Freq: Two times a day (BID) | ORAL | Status: DC
  Administered 2018-11-26 – 2018-11-28 (×6): 10 meq via ORAL
  Filled 2018-11-26 (×6): qty 1

## 2018-11-26 NOTE — Care Management Obs Status (Signed)
[place] NOTIFICATION   Patient Details  Name: [name] MRN: [id number] Date of Birth: 07-Oct-1924   Medicare Observation Status Notification Given:  Yes    Nila Nephew, LCSW 11/26/2018, 1:59 PM

## 2018-11-26 NOTE — TOC Initial Note (Signed)
Transition of Care [facility]) - Initial/Assessment Note    Patient Details  Name: [name] MRN: [id number] Date of Birth: 1925/08/27  Transition of Care [facility]) CM/SW Contact:    Nila Nephew, LCSW Phone Number: 11/26/2018, 9:55 AM  Clinical Narrative:   CSW consulted to assist with disposition as pt admitted from [facility] - has been in the assisted living x 15 months and moved to SNF 2 days ago. Was very drowsy upon admission there and pt subsequentially brought to ED and admitted for UTI.   Has wound to leg- husband states "her skin is very delicate and sensitive to bruising and tears" At baseline needs assistance to transfer OOB, use wheelchair, needs total assistance feeding. Has outpatient palliative care at facility. CSW spoke with facility- they are aware of pt's status. Will assist with coordinating pt's return to [facility] [place].             Expected Discharge Plan: Skilled Nursing Facility Barriers to Discharge: Continued Medical Work up   Patient Goals and CMS Choice Patient states their goals for this hospitalization and ongoing recovery are:: "treat infection" CMS Medicare.gov Compare Post Acute Care list provided to:: (n/a- pt long term care resident at [facility]) Choice offered to / list presented to : NA  Expected Discharge Plan and Services Expected Discharge Plan: [place] Discharge Planning Services: NA Post Acute Care Choice: [place] Living arrangements for the past 2 months: Assisted Living Facility(moved to assisted living 2 days ago) Expected Discharge Date: (unknown)                        Prior Living Arrangements/Services Living arrangements for the past 2 months: Assisted Living Facility(moved to assisted living 2 days ago) Lives with:: Facility Resident Patient language and need for interpreter reviewed:: No Do you feel safe going back to the place where you live?: Yes      Need for Family Participation  in Patient Care: Yes (Comment)(pt with dementia) Care giver support system in place?: Yes (comment)(husband, SNF)   Criminal Activity/Legal Involvement Pertinent to Current Situation/Hospitalization: No - Comment as needed  Activities of Daily Living Home Assistive Devices/Equipment: Wheelchair ADL Screening (condition at time of admission) Patient's cognitive ability adequate to safely complete daily activities?: No Is the patient deaf or have difficulty hearing?: No Does the patient have difficulty seeing, even when wearing glasses/contacts?: Yes Does the patient have difficulty concentrating, remembering, or making decisions?: Yes Patient able to express need for assistance with ADLs?: Yes Does the patient have difficulty dressing or bathing?: Yes Independently performs ADLs?: No Communication: Independent Dressing (OT): Needs assistance Is this a change from baseline?: Pre-admission baseline Grooming: Needs assistance Is this a change from baseline?: Pre-admission baseline Feeding: Needs assistance Is this a change from baseline?: Pre-admission baseline Bathing: Needs assistance Is this a change from baseline?: Pre-admission baseline Toileting: Needs assistance Is this a change from baseline?: Pre-admission baseline In/Out Bed: Needs assistance Is this a change from baseline?: Pre-admission baseline Walks in Home: Dependent Is this a change from baseline?: Pre-admission baseline Does the patient have difficulty walking or climbing stairs?: Yes Weakness of Legs: Both Weakness of Arms/Hands: Both  Permission Sought/Granted Permission sought to share information with : Family Supports Permission granted to share information with : (pt somnulent)  Share Information with NAME: husband earl  Permission granted to share info w AGENCY: [facility]        Emotional  Assessment Appearance:: Appears stated age Attitude/Demeanor/Rapport: (sleeping) Affect (typically  observed): Calm Orientation: : (UTA- sleeping) Alcohol / Substance Use: Not Applicable Psych Involvement: No (comment)  Admission diagnosis:  Dehydration [E86.0] Urinary tract infection without hematuria, site unspecified [N39.0] Patient Active Problem List   Diagnosis Date Noted  . Dehydration 11/25/2018  . Acute lower UTI 11/25/2018  . AKI (acute kidney injury) ([place]) 11/25/2018  . Acute encephalopathy 11/25/2018  . Venous stasis ulcer ([place]) 11/25/2018  . Dementia with behavioral disturbance ([place]) 11/25/2018  . HTN (hypertension) 05/05/2015  . Fatigue 11/02/2014  . Acute on chronic diastolic heart failure ([place]) 05/26/2013  . Pacemaker-St.Jude 07/03/2012  . Hyponatremia 09/18/2011  . Dysphagia 07/23/2011  . Anticoagulant long-term use 06/28/2011  . Tachycardia-bradycardia ([place]) 02/06/2011  . Diastolic CHF, chronic ([place]) 02/06/2011  . Atrial fibrillation ([place]) 02/01/2011  . Hypertension   . Renal artery stenosis ([place])   . GERD (gastroesophageal reflux disease)    PCP:  Ferd Hibbs, NP Pharmacy:   [facility] [place] [address] [place] [address] Phone: [phone] Fax: [place], [place] [facility] [place] [facility] [place] [address] [place] [address] Phone: [phone] Fax: [phone]     Social Determinants of Health ([place]) Interventions    Readmission Risk Interventions  Readmission Risk Prevention Plan 11/26/2018  Transportation Screening Complete  PCP or Specialist Appt within 3-5 Days Complete  HRI or [place] (No Data)  Social Work Consult for [place] Planning/Counseling [place] Not Applicable  Some recent data might be hidden   Sharren Bridge, MSW, LCSW Clinical Social Work 11/26/2018 [phone]

## 2018-11-26 NOTE — Progress Notes (Signed)
PROGRESS NOTE    [name]  WEX:[id number] DOB: Jun 22, 1925 DOA: 11/25/2018 PCP: Ferd Hibbs, NP   Brief Narrative: [name] is a 85 y.o. female with medical history significant of Dementia, systolic CHF, atrial fibrillation, dysphasia, GERD, hiatal hernia, hypertension, hypothyroidism, status post pacemaker, seizure disorder, SIADH, tachybradycardia syndrome. She presented secondary to lethargy, hallucinations, concerning for acute encephalopathy and UTI. On antibiotics.   Assessment & Plan:   Active Problems:   Hypertension   GERD (gastroesophageal reflux disease)   Atrial fibrillation (HCC)   Tachycardia-bradycardia (HCC)   Diastolic CHF, chronic (HCC)   Dysphagia   Pacemaker-St.Jude   Dehydration   Acute lower UTI   AKI (acute kidney injury) ([place])   Acute encephalopathy   Venous stasis ulcer ([place])   Dementia with behavioral disturbance (HCC)   Acute metabolic encephalopathy Likely secondary to infection. Patient not very interactive today likely secondary to time of day. Unknown if at baseline.  Urinary tract infection Urine culture still pending -Continue ceftriaxone -Urine culture  Acute kidney injury Baseline creatinine of 0.8 from one year ago. Creatinine of 1.55 with improvement to 1.26 today on IV fluids. In setting of dehydration. History of dysphagia and dementia -Continue IV fluids today -Repeat BMP -Encourage oral intake  Atrial fibrillation Tachy-brady syndrome Patient is s/p pace maker. She is also on Tikosyn, metoprolol, potassium supplementation and Pradaxa -Continue Tikosyn, metoprolol, Pradaxa, potassium  Dementia Associated behavioral disturbances including sundowning. Currently on Seroquel and Namenda -Continue Seroquel/namenda  GERD -Continue Protonix  Essential hypertension Normotensive -Continue to hold Benicar, lasix and spironolactone in setting of AKI  Diastolic heart failure On lasix as an outpatient. Last EF of  55-60%.    DVT prophylaxis: Pradaxa Code Status:   Code Status: DNR Family Communication: None at bedside Disposition Plan: Discharge likely in 24 hours pending culture results and improvement of AKI   Consultants:   None  Procedures:   None  Antimicrobials:  Ceftriaxone    Subjective: No complaints  Objective: Vitals:   11/25/18 2200 11/25/18 2233 11/26/18 0441 11/26/18 1000  BP: 96/67 (!) 145/99 98/64 (!) 119/54  Pulse: 62 62 60 60  Resp: (!) 22 15 15    Temp:  98.3 F (36.8 C) 98 F (36.7 C)   TempSrc:  Oral Oral   SpO2: 100% 91% 100%     Intake/Output Summary (Last 24 hours) at 11/26/2018 1414 Last data filed at 11/26/2018 0915 Gross per 24 hour  Intake 1362.15 ml  Output -  Net 1362.15 ml   There were no vitals filed for this visit.  Examination:  General exam: Appears calm and comfortable Respiratory system: Diminished. Respiratory effort normal. Cardiovascular system: S1 & S2 heard, RRR. Gastrointestinal system: Abdomen is nondistended, soft and nontender. No organomegaly or masses felt. Normal bowel sounds heard. Central nervous system: Alert Difficult to assess as patient is sleepy. Extremities: No edema. No calf tenderness Skin: No cyanosis. No rashes     Data Reviewed: I have personally reviewed following labs and imaging studies  CBC: Recent Labs  Lab 11/25/18 1539 11/26/18 0511  WBC 8.0 6.4  NEUTROABS 5.5  --   HGB 13.6 12.5  HCT 42.1 39.0  MCV 104.2* 104.8*  PLT 178 938   Basic Metabolic Panel: Recent Labs  Lab 11/25/18 1539 11/26/18 0511  NA 137 138  K 4.9 3.8  CL 98 104  CO2 32 30  GLUCOSE 80 87  BUN 50* 39*  CREATININE 1.55* 1.26*  CALCIUM 8.9 8.5*  MG  --  2.1  PHOS  --  3.0   GFR: CrCl cannot be calculated (Unknown ideal weight.). Liver Function Tests: Recent Labs  Lab 11/25/18 1539 11/26/18 0511  AST 33 21  ALT 13 12  ALKPHOS 59 53  BILITOT 1.2 0.7  PROT 6.5 5.7*  ALBUMIN 2.7* 2.3*   No results  for input(s): LIPASE, AMYLASE in the last 168 hours. No results for input(s): AMMONIA in the last 168 hours. Coagulation Profile: No results for input(s): INR, PROTIME in the last 168 hours. Cardiac Enzymes: No results for input(s): CKTOTAL, CKMB, CKMBINDEX, TROPONINI in the last 168 hours. BNP (last 3 results) No results for input(s): PROBNP in the last 8760 hours. HbA1C: No results for input(s): HGBA1C in the last 72 hours. CBG: No results for input(s): GLUCAP in the last 168 hours. Lipid Profile: No results for input(s): CHOL, HDL, LDLCALC, TRIG, CHOLHDL, LDLDIRECT in the last 72 hours. Thyroid Function Tests: No results for input(s): TSH, T4TOTAL, FREET4, T3FREE, THYROIDAB in the last 72 hours. Anemia Panel: No results for input(s): VITAMINB12, FOLATE, FERRITIN, TIBC, IRON, RETICCTPCT in the last 72 hours. Sepsis Labs: No results for input(s): PROCALCITON, LATICACIDVEN in the last 168 hours.  Recent Results (from the past 240 hour(s))  MRSA PCR Screening     Status: None   Collection Time: 11/25/18 10:49 PM  Result Value Ref Range Status   MRSA by PCR NEGATIVE NEGATIVE Final    Comment:        The GeneXpert MRSA Assay (FDA approved for NASAL specimens only), is one component of a comprehensive MRSA colonization surveillance program. It is not intended to diagnose MRSA infection nor to guide or monitor treatment for MRSA infections. Performed at [facility], [place] [address], [place], [place] [address]          Radiology Studies: No results found.      Scheduled Meds: . dabigatran  75 mg Oral Q12H  . divalproex  250 mg Oral BID AC  . divalproex  500 mg Oral QHS  . dofetilide  250 mcg Oral BID  . dorzolamide-timolol  1 drop Right Eye BID  . fesoterodine  4 mg Oral Daily  . guaiFENesin  600 mg Oral BID  . latanoprost  1 drop Right Eye QHS  . memantine  10 mg Oral BID  . metoprolol tartrate  25 mg Oral BID  . mirabegron ER  50 mg Oral  Daily  . pantoprazole  40 mg Oral Daily  . potassium chloride  10 mEq Oral BID  . QUEtiapine  25 mg Oral QPM  . QUEtiapine  50 mg Oral QHS   Continuous Infusions: . [START ON 12/02/2018] cefTRIAXone (ROCEPHIN)  IV       LOS: 0 days     Cordelia Poche, MD [facility] 11/26/2018, 2:14 PM  If 7PM-7AM, please contact night-coverage [email or web address]

## 2018-11-26 NOTE — Consult Note (Signed)
Consultation Note Date: 11/26/2018   Patient Name: [name]  DOB: [birth year]  MRN: [id number]  Age / Sex: 85 y.o., female  PCP: Ferd Hibbs, NP Referring Physician: Mariel Aloe, MD  Reason for Consultation: Establishing goals of care  HPI/Patient Profile: 85 y.o. female  with past medical history of CHF, a fib, dysphagia, GERD, hiatal hernia, HTN, hypothyroid, pacemaker, seizure d/o, SIADH, tachybrady, dementia with worsened behaviors recently moved from ALF to SNF care at [facility] admitted on 11/25/2018 with decreased mental status.   Clinical Assessment and Goals of Care: I met today with patient's husband, Hedy Camara.   I introduced palliative care as specialized medical care for people living with serious illness. It focuses on providing relief from the symptoms and stress of a serious illness. The goal is to improve quality of life for both the patient and the family.  We discussed clinical course as well as wishes moving forward in regard to advanced directives.  We discussed the continued decline that she has suffered in her nutrition, cognition, and functional status.  Discussed recent transition to SNF level care.      Questions and concerns addressed.   PMT will continue to support holistically.  SUMMARY OF RECOMMENDATIONS   - Patient already followed by palliative care as an outpatient.  Recently transitioned to SNF level care and plan to return to this level care on discharge. - Met today with husband to begin discussion regarding long term goals depending on her course over the next day or two.  Plan to meet again tomorrow to continue conversation based on clinical course.  Code Status/Advance Care Planning:  DNR  Palliative Prophylaxis:   Frequent Pain Assessment  Psycho-social/Spiritual:   Desire for further Chaplaincy support:Did not address today  Additional Recommendations:  Caregiving  Support/Resources  Prognosis:   Unable to determine  Discharge Planning: To Be Determined      Primary Diagnoses: Present on Admission: . Hypertension . GERD (gastroesophageal reflux disease) . Atrial fibrillation ([place]) . Tachycardia-bradycardia ([place]) . Diastolic CHF, chronic ([place]) . Pacemaker-St.Jude . Dehydration . Acute lower UTI . AKI (acute kidney injury) ([place]) . Acute encephalopathy . Venous stasis ulcer ([place]) . Dementia with behavioral disturbance ([place]) . UTI (urinary tract infection)   I have reviewed the medical record, interviewed the patient and family, and examined the patient. The following aspects are pertinent.  Past Medical History:  Diagnosis Date  . (HFpEF) heart failure with preserved ejection fraction ([place])   . Arthritis   . Atrial fibrillation [facility])    Prior TEE/CV in May 2012; managed with rate control and anticoagulation  . Back pain    CHRONIC  . Dysphagia   . GERD (gastroesophageal reflux disease)   . Hiatal hernia   . Hypertension   . Hyponatremia    improved  . Osteopenia   . Pacemaker   . Pneumonia 04/2010  . Renal artery stenosis ([place]) 2008   STATUS POST ANGIOPLASTY  . Seizure disorder ([place])    on anti-epileptic therapy  .  SIADH (syndrome of inappropriate ADH production) ([place])    h/o HYPONATREMIA ; LIKELY RELATED TO HER LUNG PROCESS  . Tachy-brady syndrome [facility])    with pauses noted in May 2012; s/p PTVP October 2012  . Urinary incontinence    Social History   Socioeconomic History  . Marital status: Married    Spouse name: Not on file  . Number of children: 3  . Years of education: 10  . Highest education level: Not on file  Occupational History  . Occupation: RETIRED    Employer: [employer]: 1980  Social Needs  . Financial resource strain: Not on file  . Food insecurity:    Worry: Not on file    Inability: Not on file  . Transportation needs:    Medical: Not on file    Non-medical:  Not on file  Tobacco Use  . Smoking status: Never Smoker  . Smokeless tobacco: Never Used  Substance and Sexual Activity  . Alcohol use: No  . Drug use: No  . Sexual activity: Never  Lifestyle  . Physical activity:    Days per week: Not on file    Minutes per session: Not on file  . Stress: Not on file  Relationships  . Social connections:    Talks on phone: Not on file    Gets together: Not on file    Attends religious service: Not on file    Active member of club or organization: Not on file    Attends meetings of clubs or organizations: Not on file    Relationship status: Not on file  Other Topics Concern  . Not on file  Social History Narrative   REMARRIED   3 CHILDREN   2 GRANDCHILDREN    4 GREAT GRANDCHILDREN   10th GRADE EDUCATION   RETIRED FROM [employer] SINCE 1980\   NO SMOKING, OR DRUG USE   ACTIVITY: AMBULATES WITH CANE OUTSIDE HOME DUE TO KNEE PAIN. ABLE TO AMBULATE WITHIN HOME, UP STAIRS WITHOUT INCIDENT AT BASELINE.                Family History  Problem Relation Age of Onset  . Stroke Mother   . Diabetes Mother   . Heart disease Mother        Unkown  . Stroke Father   . Diabetes Father   . Prostate cancer Father   . Kidney failure Brother   . Lung disease Brother   . Lung disease Sister   . Hypertension Daughter   . Hypertension Son   . Bladder Cancer Brother    Scheduled Meds: . dabigatran  75 mg Oral Q12H  . divalproex  250 mg Oral BID AC  . divalproex  500 mg Oral QHS  . dofetilide  250 mcg Oral BID  . dorzolamide-timolol  1 drop Right Eye BID  . fesoterodine  4 mg Oral Daily  . guaiFENesin  600 mg Oral BID  . latanoprost  1 drop Right Eye QHS  . memantine  10 mg Oral BID  . metoprolol tartrate  25 mg Oral BID  . mirabegron ER  50 mg Oral Daily  . pantoprazole  40 mg Oral Daily  . potassium chloride  10 mEq Oral BID  . QUEtiapine  25 mg Oral QPM  . QUEtiapine  50 mg Oral QHS   Continuous Infusions: . [START ON 12/02/2018]  cefTRIAXone (ROCEPHIN)  IV     PRN Meds:.acetaminophen **OR** acetaminophen, albuterol, HYDROcodone-acetaminophen,  LORazepam, ondansetron **OR** ondansetron (ZOFRAN) IV Medications Prior to Admission:  Prior to Admission medications   Medication Sig Start Date End Date Taking? Authorizing Provider  BENICAR 40 MG tablet TAKE 1 TABLET DAILY 11/02/17  Yes Allred, Jeneen Rinks, MD  calcium carbonate (TUMS EX) 750 MG chewable tablet Chew 1 tablet by mouth 2 (two) times daily.   Yes [provider]  cholecalciferol (VITAMIN D) 1000 units tablet Take 400 Units by mouth daily.    Yes [provider]  divalproex (DEPAKOTE SPRINKLE) 125 MG capsule Take 250 mg by mouth 2 (two) times daily.   Yes [provider]  divalproex (DEPAKOTE) 500 MG DR tablet Take 500 mg by mouth at bedtime.   Yes [provider]  dofetilide (TIKOSYN) 250 MCG capsule TAKE 1 CAPSULE TWICE A DAY 01/19/17  Yes [place], Peter M, MD  dorzolamide-timolol (COSOPT) 22.3-6.8 MG/ML ophthalmic solution Put one drop in right eye twice a day (use as directed) 12/02/15  Yes [provider]  fesoterodine (TOVIAZ) 4 MG TB24 tablet Take 4 mg by mouth daily.   Yes [provider]  furosemide (LASIX) 40 MG tablet TAKE 1 TABLET DAILY 09/14/17  Yes Allred, Jeneen Rinks, MD  KLOR-CON M10 10 MEQ tablet TAKE 1 TABLET TWICE A DAY 11/28/16  Yes Allred, Jeneen Rinks, MD  LATANOPROST OP Place 1 drop into the right eye at bedtime.   Yes [provider]  LORazepam (ATIVAN) 0.5 MG tablet Take 0.5 mg by mouth daily as needed for anxiety.   Yes [provider]  memantine (NAMENDA) 10 MG tablet Take 10 mg by mouth 2 (two) times daily.    Yes [provider]  metoprolol tartrate (LOPRESSOR) 25 MG tablet TAKE 1 TABLET TWICE A DAY 01/16/17  Yes [place], Peter M, MD  mirabegron ER (MYRBETRIQ) 50 MG TB24 tablet Take 50 mg by mouth daily.   Yes [provider]  Multiple Vitamins-Minerals (MULTIVITAMINS THER.  W/MINERALS) TABS Take 1 tablet by mouth daily.     Yes [provider]  pantoprazole (PROTONIX) 40 MG tablet Take 1 tablet by mouth daily. 10/29/16  Yes [provider]  PRADAXA 75 MG CAPS capsule TAKE 1 CAPSULE EVERY 12 HOURS 11/08/17  Yes Allred, Jeneen Rinks, MD  QUEtiapine (SEROQUEL) 25 MG tablet Take 25 mg by mouth every evening.   Yes [provider]  QUEtiapine (SEROQUEL) 50 MG tablet Take 50 mg by mouth at bedtime.   Yes [provider]  spironolactone (ALDACTONE) 12.5 mg TABS tablet Take 12.5 mg by mouth daily. Monday, Wednesday, and friday   Yes [provider]  Tiotropium Bromide Monohydrate (SPIRIVA RESPIMAT) 1.25 MCG/ACT AERS Inhale 2 puffs into the lungs at bedtime as needed (copd).   Yes [provider]   Allergies  Allergen Reactions  . Amiodarone     Possibly caused pneumonitis   Review of Systems Unable to obtain  Physical Exam General: Does not arouse Heart: Regular rate and rhythm. No murmur appreciated. Lungs: Good air movement, clear Abdomen: Soft, nontender, nondistended, positive bowel sounds.  Ext: No significant edema Skin: Warm and dry  Vital Signs: BP (!) 97/47 (BP Location: Right Arm)   Pulse 60   Temp 98.1 F (36.7 C) (Oral)   Resp 16   Ht '5\' 3"'$  (1.6 m)   Wt 57.3 kg   SpO2 98%   BMI 22.38 kg/m  Pain Scale: PAINAD   Pain Score: Asleep   SpO2: SpO2: 98 % O2 Device:SpO2: 98 % O2 Flow Rate: .  O2 Flow Rate (L/min): 2 L/min  IO: Intake/output summary:   Intake/Output Summary (Last 24 hours) at 11/26/2018 2212 Last data filed at 11/26/2018 1230 Gross per 24 hour  Intake 420 ml  Output -  Net 420 ml    LBM: Last BM Date: (PTA) Baseline Weight: Weight: 57.3 kg Most recent weight: Weight: 57.3 kg     Palliative Assessment/Data:   Flowsheet Rows     Most Recent Value  Intake Tab  Referral Department  Hospitalist  Unit at Time of Referral  ER  Palliative Care Primary Diagnosis  Neurology    Date Notified  11/26/18  Palliative Care Type  New Palliative care  Reason for referral  Clarify Goals of Care  Date of Admission  11/25/18  Date first seen by Palliative Care  11/26/18  # of days Palliative referral response time  0 Day(s)  # of days IP prior to Palliative referral  1  Clinical Assessment  Psychosocial & Spiritual Assessment  Palliative Care Outcomes  Patient/Family meeting held?  Yes  Who was at the meeting?  Husband      Time In: 1200 Time Out: 1100 Time Total: 60 Greater than 50%  of this time was spent counseling and coordinating care related to the above assessment and plan.  Signed by: Micheline Rough, MD   Please contact Palliative Medicine Team phone at [phone] for questions and concerns.  For individual provider: See Shea Evans

## 2018-11-27 ENCOUNTER — Inpatient Hospital Stay (HOSPITAL_COMMUNITY): Payer: Medicare Other

## 2018-11-27 DIAGNOSIS — I82409 Acute embolism and thrombosis of unspecified deep veins of unspecified lower extremity: Secondary | ICD-10-CM

## 2018-11-27 LAB — BASIC METABOLIC PANEL
ANION GAP: 5 (ref 5–15)
BUN: 37 mg/dL — ABNORMAL HIGH (ref 8–23)
CO2: 28 mmol/L (ref 22–32)
Calcium: 8.9 mg/dL (ref 8.9–10.3)
Chloride: 106 mmol/L (ref 98–111)
Creatinine, Ser: 1.15 mg/dL — ABNORMAL HIGH (ref 0.44–1.00)
GFR calc Af Amer: 48 mL/min — ABNORMAL LOW (ref >60)
GFR calc non Af Amer: 42 mL/min — ABNORMAL LOW (ref >60)
Glucose, Bld: 84 mg/dL (ref 70–99)
POTASSIUM: 4.6 mmol/L (ref 3.5–5.1)
Sodium: 139 mmol/L (ref 135–145)

## 2018-11-27 LAB — URINE CULTURE

## 2018-11-27 LAB — TSH: TSH: 2.216 u[IU]/mL (ref 0.350–4.500)

## 2018-11-27 MED ORDER — DIPHENHYDRAMINE HCL 50 MG/ML IJ SOLN: 25.0000 mg | Freq: Once | INTRAMUSCULAR | Status: DC

## 2018-11-27 MED ORDER — SODIUM CHLORIDE 0.9 % IV SOLN
1.0000 g | INTRAVENOUS | Status: DC
  Administered 2018-11-27 – 2018-11-29 (×3): 1 g via INTRAVENOUS
  Filled 2018-11-27 (×3): qty 1

## 2018-11-27 NOTE — Progress Notes (Signed)
PROGRESS NOTE    [name]  XLK:[id number] DOB: Feb 05, 1925 DOA: 11/25/2018 PCP: Ferd Hibbs, NP   Brief Narrative: [name] is a 85 y.o. female with medical history significant of Dementia, systolic CHF, atrial fibrillation, dysphasia, GERD, hiatal hernia, hypertension, hypothyroidism, status post pacemaker, seizure disorder, SIADH, tachybradycardia syndrome. She presented secondary to lethargy, hallucinations, concerning for acute encephalopathy and UTI. On antibiotics.   Assessment & Plan:   Active Problems:   Hypertension   GERD (gastroesophageal reflux disease)   Atrial fibrillation (HCC)   Tachycardia-bradycardia (HCC)   Diastolic CHF, chronic (HCC)   Dysphagia   Pacemaker-St.Jude   Dehydration   Acute lower UTI   AKI (acute kidney injury) ([place])   Acute encephalopathy   Venous stasis ulcer ([place])   Dementia with behavioral disturbance (HCC)   UTI (urinary tract infection)   Acute metabolic encephalopathy Likely secondary to infection. Patient not very interactive today likely secondary to time of day. Per husband, back to most recent baseline.  Urinary tract infection Urine culture with multiple species. Difficult to get proper history with regard to symptoms. -Continue ceftriaxone  Acute kidney injury Baseline creatinine of 0.8 from one year ago. Creatinine of 1.55 with improvement to 1.26 today on IV fluids. In setting of dehydration. History of dysphagia and dementia -Continue IV fluids today -Encourage oral intake  Atrial fibrillation Tachy-brady syndrome Patient is s/p pace maker. She is also on Tikosyn, metoprolol, potassium supplementation and Pradaxa -Continue Tikosyn, metoprolol, Pradaxa, potassium  Dementia Associated behavioral disturbances including sundowning. Currently on Seroquel and Namenda -Continue Seroquel/namenda  GERD -Continue Protonix  Essential hypertension Normotensive -Continue to hold Benicar, lasix and spironolactone  in setting of AKI  Diastolic heart failure On lasix as an outpatient. Last EF of 55-60%  LE leg pain Likely musculoskeletal. Patient is on Pradaxa as an outpatient -Left LE duplex    DVT prophylaxis: Pradaxa Code Status:   Code Status: DNR Family Communication: Husband at bedside Disposition Plan: Discharge likely in 24 hours if weaned off of oxygen and pending LE duplex  Consultants:   None  Procedures:   None  Antimicrobials:  Ceftriaxone    Subjective: Some left calf pain. Unsure how new this is as patient is a poor historian.   Objective: Vitals:   11/26/18 1502 11/26/18 2109 11/27/18 0143 11/27/18 0616  BP: (!) 119/56 (!) 97/47 (!) 107/44 120/61  Pulse: (!) 59 60 (!) 59 60  Resp: 16 16 17 17   Temp: 97.9 F (36.6 C) 98.1 F (36.7 C) 98 F (36.7 C) 98.2 F (36.8 C)  TempSrc: Oral Oral Oral Oral  SpO2: 98% 98% 100% 98%  Weight: 57.3 kg     Height: 5\' 3"  (1.6 m)       Intake/Output Summary (Last 24 hours) at 11/27/2018 1305 Last data filed at 11/27/2018 1001 Gross per 24 hour  Intake 120 ml  Output 650 ml  Net -530 ml   Filed Weights   11/26/18 1502  Weight: 57.3 kg    Examination:  General exam: Appears calm and comfortable Respiratory system: Clear to auscultation. Respiratory effort normal. Cardiovascular system: S1 & S2 heard, RRR. No murmurs, rubs, gallops or clicks. Gastrointestinal system: Abdomen is nondistended, soft and nontender. No organomegaly or masses felt. Normal bowel sounds heard. Central nervous system: Alert and oriented to self. No focal neurological deficits. Extremities: No edema. Left calf tenderness Skin: No cyanosis. No rashes Psychiatry: Judgement and insight appear impaired. Flat affect   Data Reviewed: I have  personally reviewed following labs and imaging studies  CBC: Recent Labs  Lab 11/25/18 1539 11/26/18 0511  WBC 8.0 6.4  NEUTROABS 5.5  --   HGB 13.6 12.5  HCT 42.1 39.0  MCV 104.2* 104.8*  PLT 178  381   Basic Metabolic Panel: Recent Labs  Lab 11/25/18 1539 11/26/18 0511 11/27/18 0751  NA 137 138 139  K 4.9 3.8 4.6  CL 98 104 106  CO2 32 30 28  GLUCOSE 80 87 84  BUN 50* 39* 37*  CREATININE 1.55* 1.26* 1.15*  CALCIUM 8.9 8.5* 8.9  MG  --  2.1  --   PHOS  --  3.0  --    GFR: Estimated Creatinine Clearance: 26.4 mL/min (A) (by C-G formula based on SCr of 1.15 mg/dL (H)). Liver Function Tests: Recent Labs  Lab 11/25/18 1539 11/26/18 0511  AST 33 21  ALT 13 12  ALKPHOS 59 53  BILITOT 1.2 0.7  PROT 6.5 5.7*  ALBUMIN 2.7* 2.3*   No results for input(s): LIPASE, AMYLASE in the last 168 hours. No results for input(s): AMMONIA in the last 168 hours. Coagulation Profile: No results for input(s): INR, PROTIME in the last 168 hours. Cardiac Enzymes: No results for input(s): CKTOTAL, CKMB, CKMBINDEX, TROPONINI in the last 168 hours. BNP (last 3 results) No results for input(s): PROBNP in the last 8760 hours. HbA1C: No results for input(s): HGBA1C in the last 72 hours. CBG: No results for input(s): GLUCAP in the last 168 hours. Lipid Profile: No results for input(s): CHOL, HDL, LDLCALC, TRIG, CHOLHDL, LDLDIRECT in the last 72 hours. Thyroid Function Tests: Recent Labs    11/27/18 0751  TSH 2.216   Anemia Panel: No results for input(s): VITAMINB12, FOLATE, FERRITIN, TIBC, IRON, RETICCTPCT in the last 72 hours. Sepsis Labs: No results for input(s): PROCALCITON, LATICACIDVEN in the last 168 hours.  Recent Results (from the past 240 hour(s))  Urine culture     Status: Abnormal   Collection Time: 11/25/18  5:49 PM  Result Value Ref Range Status   Specimen Description   Final    URINE, RANDOM Performed at [facility] [address] [place], [place] [address]    Special Requests   Final    NONE Performed at [facility], [place] [address], [place], [place] [address]    Culture MULTIPLE SPECIES PRESENT, SUGGEST RECOLLECTION  (A)  Final   Report Status 11/27/2018 FINAL  Final  MRSA PCR Screening     Status: None   Collection Time: 11/25/18 10:49 PM  Result Value Ref Range Status   MRSA by PCR NEGATIVE NEGATIVE Final    Comment:        The GeneXpert MRSA Assay (FDA approved for NASAL specimens only), is one component of a comprehensive MRSA colonization surveillance program. It is not intended to diagnose MRSA infection nor to guide or monitor treatment for MRSA infections. Performed at [facility], [place] [address], [place], [place] [address]          Radiology Studies: No results found.      Scheduled Meds: . dabigatran  75 mg Oral Q12H  . diphenhydrAMINE  25 mg Intravenous Once  . divalproex  250 mg Oral BID AC  . divalproex  500 mg Oral QHS  . dofetilide  250 mcg Oral BID  . dorzolamide-timolol  1 drop Right Eye BID  . fesoterodine  4 mg Oral Daily  . guaiFENesin  600 mg Oral BID  .  latanoprost  1 drop Right Eye QHS  . memantine  10 mg Oral BID  . metoprolol tartrate  25 mg Oral BID  . mirabegron ER  50 mg Oral Daily  . pantoprazole  40 mg Oral Daily  . potassium chloride  10 mEq Oral BID  . QUEtiapine  25 mg Oral QPM  . QUEtiapine  50 mg Oral QHS   Continuous Infusions: . cefTRIAXone (ROCEPHIN)  IV       LOS: 1 day     Cordelia Poche, MD [facility] 11/27/2018, 1:05 PM  If 7PM-7AM, please contact night-coverage [email or web address]

## 2018-11-27 NOTE — Evaluation (Signed)
Clinical/Bedside Swallow Evaluation Patient Details  Name: [name] MRN: [id number] Date of Birth: 1925-01-02  Today's Date: 11/27/2018 Time: SLP Start Time (ACUTE ONLY): 1115 SLP Stop Time (ACUTE ONLY): 1150 SLP Time Calculation (min) (ACUTE ONLY): 35 min  Past Medical History:  Past Medical History:  Diagnosis Date  . (HFpEF) heart failure with preserved ejection fraction ([place])   . Arthritis   . Atrial fibrillation [facility])    Prior TEE/CV in May 2012; managed with rate control and anticoagulation  . Back pain    CHRONIC  . Dysphagia   . GERD (gastroesophageal reflux disease)   . Hiatal hernia   . Hypertension   . Hyponatremia    improved  . Osteopenia   . Pacemaker   . Pneumonia 04/2010  . Renal artery stenosis ([place]) 2008   STATUS POST ANGIOPLASTY  . Seizure disorder ([place])    on anti-epileptic therapy  . SIADH (syndrome of inappropriate ADH production) ([place])    h/o HYPONATREMIA ; LIKELY RELATED TO HER LUNG PROCESS  . Tachy-brady syndrome [facility])    with pauses noted in May 2012; s/p PTVP October 2012  . Urinary incontinence    Past Surgical History:  Past Surgical History:  Procedure Laterality Date  . APPENDECTOMY    . BACK SURGERY     X2  . CARDIAC CATHETERIZATION  2007   Normal  . CARDIOVERSION    . CARDIOVERSION  10/14/2012   Procedure: CARDIOVERSION;  Surgeon: Thayer Headings, MD;  Location: [facility] ENDOSCOPY;  Service: Cardiovascular;  Laterality: N/A;  . CARDIOVERSION N/A 05/30/2013   Procedure: CARDIOVERSION;  Surgeon: Thompson Grayer, MD;  Location: [place];  Service: Cardiovascular;  Laterality: N/A;  . CARDIOVERSION N/A 03/03/2013   Procedure: CARDIOVERSION;  Surgeon: Deboraha Sprang, MD;  Location: [facility] CATH LAB;  Service: Cardiovascular;  Laterality: N/A;  . CATARACT EXTRACTION    . CATARACT EXTRACTION Bilateral   . CHOLECYSTECTOMY    . KNEE ARTHROSCOPY Left   . PACEMAKER INSERTION  06/2011   SJM [facility] DR RF implanted by Dr Rayann Heman  . PARTIAL HYSTERECTOMY    .  RENAL ANGIOPLASTY Right 01/2007  . SHOULDER SURGERY    . TONSILLECTOMY    . TOTAL KNEE ARTHROPLASTY Bilateral    HPI:  85 yo female adm to [facility] with AMS, lethargy, hallucinations - acute encephalopathy and concern for possible UTI.  Pt PMH + for dementa - recently moved from ALF to SNF at facility approximately one month ago - spouse reports pt has gone down hill.  PMH also + for seizure disorder, SIADH, tachybradycardia syndrome.  Swallow eval ordered.     Assessment / Plan / Recommendation Clinical Impression  Pt presents with symptoms of mild dysphagia but also was sleepy during session.  She admits to occasionally coughing on liquids if she drinks too much.  Pt able to help self feed *she is weak* with hand over hand assist.  Multiple swallows noted which pt states residuals sensed in pharynx - swallowing liquids faciliated sensation of clearance.  Given pt has h/o hiatal hernia, GERD and mild esophageal dysmotility diagnosed 2012, ? if pharyngeal residual is referrant sensation.   85 yo female adm to [facility] with AMS, lethargy, hallucinations - acute encephalopathy and concern for possible UTI.  Recommend soft/thin diet given pt's altered mentation.  Spouse present and was educated to swallow precautions using teach back and written instructions.  Will follow up x1 to assure tolerance.   Pt with very delayed responses but with allowance of time and options, she  is able to verbalize her needs/wants.  Recommend allow extra time for processing information.   SLP Visit Diagnosis: Dysphagia, unspecified (R13.10)    Aspiration Risk  Mild aspiration risk    Diet Recommendation Dysphagia 3 (Mech soft);Thin liquid   Liquid Administration via: Cup;Straw Medication Administration: Whole meds with puree Supervision: Patient able to self feed Compensations: Slow rate;Small sips/bites;Other (Comment)(start intake with liquids, drink liquids t/o meal) Postural Changes: Seated upright at 90 degrees;Remain upright for at least 30 minutes after po intake     Other  Recommendations Oral Care Recommendations: Oral care BID   Follow up Recommendations None      Frequency and Duration min 1 x/week  1 week       Prognosis Prognosis for Safe Diet Advancement: Fair Barriers to Reach Goals: Cognitive deficits      Swallow Study   General Date of Onset: 11/27/18 HPI: 85 yo female adm to [facility] with AMS, lethargy, hallucinations - acute encephalopathy and concern for possible UTI.  Pt PMH + for dementa - recently moved from ALF to SNF at facility approximately one month ago - spouse reports pt has gone down hill.  PMH also + for seizure disorder, SIADH, tachybradycardia syndrome.  Swallow eval ordered.   Type of Study: Bedside Swallow Evaluation Temperature Spikes Noted: No Respiratory Status: Room air History of Recent Intubation: No Behavior/Cognition: Alert;Cooperative;Pleasant mood Oral Cavity Assessment: Within Functional Limits Oral Care Completed by SLP: No Oral Cavity - Dentition: Adequate natural dentition Vision: Functional for self-feeding Self-Feeding Abilities: Able to feed self Patient Positioning: Upright in bed Baseline Vocal Quality: Normal Volitional Cough: Weak Volitional Swallow: Unable to elicit    Oral/Motor/Sensory Function Overall Oral Motor/Sensory Function: Within functional limits   Ice Chips Ice chips: Not tested Other Comments: pt states ice bothers her teeth   Thin Liquid Thin Liquid: Impaired Presentation: Straw;Self Fed Pharyngeal  Phase Impairments: Cough - Immediate;Multiple swallows Other Comments: small boluses tolerated well    Nectar Thick Nectar Thick Liquid: Not tested   Honey Thick Honey Thick Liquid: Not tested   Puree Puree: Impaired Presentation: Self Fed;Spoon Pharyngeal Phase Impairments: Multiple swallows;Suspected delayed Swallow   Solid     Solid: Impaired Oral Phase Impairments: Other (comment)(appearance of slow mastication) Oral Phase Functional Implications: Impaired  mastication Pharyngeal Phase Impairments: Suspected delayed Swallow;Multiple swallows      Macario Golds 11/27/2018,12:55 PM Luanna Salk, MS [facility] SLP Acute Rehab Services Pager [phone] Office [phone]

## 2018-11-27 NOTE — Progress Notes (Signed)
PT Cancellation Note  Patient Details Name: TREAZURE NERY MRN: [id number] DOB: 1925/07/08   Cancelled Treatment:    Reason Eval/Treat Not Completed: PT screened, no needs identified, will sign off. Pt is from SNF and plan is for pt to return to SNF. Will defer PT eval to facility if warranted. Will sign off. Thanks.    Weston Anna, PT Acute Rehabilitation Services Pager: [phone] Office: [phone]

## 2018-11-27 NOTE — Progress Notes (Signed)
Left lower extremity venous duplex has been completed. Preliminary results can be found in CV Proc through chart review.   11/27/18 2:01 PM [name] RVT

## 2018-11-27 NOTE — Progress Notes (Signed)
Initial Nutrition Assessment  DOCUMENTATION CODES:   Non-severe (moderate) malnutrition in context of chronic illness  INTERVENTION:   Supplements available on unit (currently declines)  NUTRITION DIAGNOSIS:   Moderate Malnutrition related to chronic illness(dementia) as evidenced by energy intake < or equal to 75% for > or equal to 1 month, mild fat depletion, moderate muscle depletion.  GOAL:   Patient will meet greater than or equal to 90% of their needs  MONITOR:   PO intake, Supplement acceptance, Labs, Weight trends, I & O's  REASON FOR ASSESSMENT:   Consult Assessment of nutrition requirement/status  ASSESSMENT:   85 y.o. female with medical history significant of Dementia, systolic CHF, atrial fibrillation, dysphasia, GERD, hiatal hernia, hypertension, hypothyroidism, status post pacemaker, seizure disorder, SIADH, tachybradycardia syndrome. Admitted for lethargy, hallucinations, concerning for acute encephalopathy and UTI.  Pt in room with husband at bedside. Pt difficult to understand, pt's husband provided most history. Husband reports pt is from a nursing facility and she has not been eating well recently. He has noticed a decline in pt's speech pattern and she has gradually needed more and more assistance with feeding. He attempted to feed patient this morning and he states he was unable to. Pt consumed some juice, applesauce, bacon and toast with completion of ~25%.  Pt has been receiving protein supplements at her facility, however he reports they give the pt diarrhea. She is able to still drink them because she wears a diaper at the facility. Pt decline supplements currently. Encouraged pt and pt's husband to request supplements from unit if they change their mind.  Pt's husband denies the pt has had any swallowing issues. SLP evaluated today and found mild aspiration risk, recommends dysphagia 3 diet with thin liquids.  Per weight records, pt's weight has  remained stable. Pt's husband has noticed no weight loss.   Medications: K-DUR tablet BID Labs reviewed: GFR: 42   NUTRITION - FOCUSED PHYSICAL EXAM:    Most Recent Value  Orbital Region  Mild depletion  Upper Arm Region  Mild depletion  Thoracic and Lumbar Region  Unable to assess  Buccal Region  Moderate depletion  Temple Region  Moderate depletion  Clavicle Bone Region  Moderate depletion  Clavicle and Acromion Bone Region  Mild depletion  Scapular Bone Region  Unable to assess  Dorsal Hand  Moderate depletion  Patellar Region  Unable to assess  Anterior Thigh Region  Unable to assess  Posterior Calf Region  Unable to assess  Edema (RD Assessment)  None       Diet Order:   Diet Order            Diet regular Room service appropriate? Yes; Fluid consistency: Thin  Diet effective now              EDUCATION NEEDS:   No education needs have been identified at this time  Skin:  Skin Assessment: Reviewed RN Assessment  Last BM:  PTA  Height:   Ht Readings from Last 1 Encounters:  11/26/18 5\' 3"  (1.6 m)    Weight:   Wt Readings from Last 1 Encounters:  11/26/18 57.3 kg    Ideal Body Weight:  52.3 kg  BMI:  Body mass index is 22.38 kg/m.  Estimated Nutritional Needs:   Kcal:  1400-1600  Protein:  60-70g  Fluid:  1.6L/day   Clayton Bibles, MS, RD, LDN [place] Dietitian Pager: [phone] After Hours Pager: [phone]

## 2018-11-27 NOTE — Progress Notes (Signed)
OT Cancellation Note  Patient Details Name: [name] MRN: [id number] DOB: 01-18-1925   Cancelled Treatment:    Reason Eval/Treat Not Completed: OT screened, no needs identified, will sign off.  Per chart, pt moved from ALF to SNF due to worsening behaviors. She needs total A for adls including feeding.  Ramiro Pangilinan 11/27/2018, 10:59 AM  Lesle Chris, OTR/L Acute Rehabilitation Services [phone] [facility] pager [phone] office 11/27/2018

## 2018-11-28 DIAGNOSIS — E440 Moderate protein-calorie malnutrition: Secondary | ICD-10-CM

## 2018-11-28 MED ORDER — SODIUM CHLORIDE 0.9 % IV SOLN
INTRAVENOUS | Status: DC
  Administered 2018-11-28 – 2018-11-29 (×2): via INTRAVENOUS

## 2018-11-28 MED ORDER — LIP MEDEX EX OINT
TOPICAL_OINTMENT | CUTANEOUS | Status: AC
  Administered 2018-11-28: 12:00:00
  Filled 2018-11-28: qty 7

## 2018-11-28 NOTE — Progress Notes (Signed)
Daily Progress Note   Patient Name: [name]       Date: 11/28/2018 DOB: May 01, 1925  Age: 85 y.o. MRN#: [id number] Attending Physician: Mariel Aloe, MD Primary Care Physician: Ferd Hibbs, NP Admit Date: 11/25/2018  Reason for Consultation/Follow-up: Establishing goals of care  Subjective: I met today with patient and her husband as she was finishing working with speech therapy.  She is more awake and alert today and answers a few simple questions with one word answers.  Discussed with her husband that she appears to be better today, but still concern over her overall decline over the past few weeks.  We discussed plan to continue to monitor her nutrition and functional status with close follow-up by OP palliative.  Length of Stay: 2  Current Medications: Scheduled Meds:  . dabigatran  75 mg Oral Q12H  . diphenhydrAMINE  25 mg Intravenous Once  . divalproex  250 mg Oral BID AC  . divalproex  500 mg Oral QHS  . dofetilide  250 mcg Oral BID  . dorzolamide-timolol  1 drop Right Eye BID  . fesoterodine  4 mg Oral Daily  . guaiFENesin  600 mg Oral BID  . latanoprost  1 drop Right Eye QHS  . memantine  10 mg Oral BID  . metoprolol tartrate  25 mg Oral BID  . mirabegron ER  50 mg Oral Daily  . pantoprazole  40 mg Oral Daily  . potassium chloride  10 mEq Oral BID  . QUEtiapine  25 mg Oral QPM  . QUEtiapine  50 mg Oral QHS    Continuous Infusions: . sodium chloride    . cefTRIAXone (ROCEPHIN)  IV 1 g (11/28/18 1025)    PRN Meds: acetaminophen **OR** acetaminophen, albuterol, HYDROcodone-acetaminophen, LORazepam, ondansetron **OR** ondansetron (ZOFRAN) IV  Physical Exam         General: Sleepy, but alert, awake, with stimulation.  HEENT: No bruits, no goiter, no JVD  Heart: Regular rate and rhythm. No murmur appreciated. Lungs: Good air movement, clear Abdomen: Soft, nontender, nondistended, positive bowel sounds.  Ext: No significant edema  Vital Signs: BP (!) 112/55 (BP Location: Left Arm)   Pulse 63   Temp 97.7 F (36.5 C) (Oral)   Resp 18   Ht '5\' 3"'$  (1.6 m)   Wt 57.3 kg  SpO2 96%   BMI 22.38 kg/m  SpO2: SpO2: 96 % O2 Device: O2 Device: Nasal Cannula O2 Flow Rate: O2 Flow Rate (L/min): 1 L/min  Intake/output summary:   Intake/Output Summary (Last 24 hours) at 11/28/2018 1037 Last data filed at 11/28/2018 0631 Gross per 24 hour  Intake 380 ml  Output 700 ml  Net -320 ml   LBM: Last BM Date: (PTA) Baseline Weight: Weight: 57.3 kg Most recent weight: Weight: 57.3 kg       Palliative Assessment/Data:    Flowsheet Rows     Most Recent Value  Intake Tab  Referral Department  Hospitalist  Unit at Time of Referral  ER  Palliative Care Primary Diagnosis  Neurology  Date Notified  11/26/18  Palliative Care Type  New Palliative care  Reason for referral  Clarify Goals of Care  Date of Admission  11/25/18  Date first seen by Palliative Care  11/26/18  # of days Palliative referral response time  0 Day(s)  # of days IP prior to Palliative referral  1  Clinical Assessment  Psychosocial & Spiritual Assessment  Palliative Care Outcomes  Patient/Family meeting held?  Yes  Who was at the meeting?  Husband      Patient Active Problem List   Diagnosis Date Noted  . UTI (urinary tract infection) 11/26/2018  . Dehydration 11/25/2018  . Acute lower UTI 11/25/2018  . AKI (acute kidney injury) ([place]) 11/25/2018  . Acute encephalopathy 11/25/2018  . Venous stasis ulcer ([place]) 11/25/2018  . Dementia with behavioral disturbance ([place]) 11/25/2018  . HTN (hypertension) 05/05/2015  . Fatigue 11/02/2014  . Acute on chronic diastolic heart failure ([place]) 05/26/2013  . Pacemaker-St.Jude 07/03/2012  . Hyponatremia 09/18/2011  . Dysphagia  07/23/2011  . Anticoagulant long-term use 06/28/2011  . Tachycardia-bradycardia ([place]) 02/06/2011  . Diastolic CHF, chronic ([place]) 02/06/2011  . Atrial fibrillation ([place]) 02/01/2011  . Hypertension   . Renal artery stenosis ([place])   . GERD (gastroesophageal reflux disease)     Palliative Care Assessment & Plan   Patient Profile:   Recommendations/Plan:  Appears improved today from yesterday, but still with significant functional decline over the past few weeks.  Plan for back to SNF (recently transitioned to this higher level of care).  Recommend palliative care to continue to follow at SNF,.  Code Status:    Code Status Orders  (From admission, onward)         Start     Ordered   11/25/18 2157  Do not attempt resuscitation (DNR)  Continuous    Question Answer Comment  In the event of cardiac or respiratory ARREST Do not call a "code blue"   In the event of cardiac or respiratory ARREST Do not perform Intubation, CPR, defibrillation or ACLS   In the event of cardiac or respiratory ARREST Use medication by any route, position, wound care, and other measures to relive pain and suffering. May use oxygen, suction and manual treatment of airway obstruction as needed for comfort.      11/25/18 2157        Code Status History    Date Active Date Inactive Code Status Order ID Comments User Context   05/28/2013 1928 05/31/2013 1529 Full Code [id number]  Andrez Grime, PA-C Inpatient    Advance Directive Documentation     Most Recent Value  Type of Advance Directive  Healthcare Power of Attorney, Living will  Pre-existing out of facility DNR order (yellow form or pink MOST form)  -  "  MOST" Form in Place?  -       Prognosis:   Unable to determine  Discharge Planning:  [place] for rehab with Palliative care service follow-up  Care plan was discussed with husband  Thank you for allowing the Palliative Medicine Team to assist in the care of this patient.    Time In: 8527 Time Out: 1305 Total Time 20 Prolonged Time Billed No      Greater than 50%  of this time was spent counseling and coordinating care related to the above assessment and plan.  Micheline Rough, MD  Please contact Palliative Medicine Team phone at [phone] for questions and concerns.

## 2018-11-28 NOTE — Progress Notes (Signed)
PROGRESS NOTE    [name]  LEX:[id number] DOB: Dec 05, 1924 DOA: 11/25/2018 PCP: Ferd Hibbs, NP   Brief Narrative: [name] is a 85 y.o. female with medical history significant of Dementia, systolic CHF, atrial fibrillation, dysphasia, GERD, hiatal hernia, hypertension, hypothyroidism, status post pacemaker, seizure disorder, SIADH, tachybradycardia syndrome. She presented secondary to lethargy, hallucinations, concerning for acute encephalopathy and UTI. On antibiotics.   Assessment & Plan:   Active Problems:   Hypertension   GERD (gastroesophageal reflux disease)   Atrial fibrillation (HCC)   Tachycardia-bradycardia (HCC)   Diastolic CHF, chronic (HCC)   Dysphagia   Pacemaker-St.Jude   Dehydration   Acute lower UTI   AKI (acute kidney injury) ([place])   Acute encephalopathy   Venous stasis ulcer (HCC)   Dementia with behavioral disturbance (HCC)   UTI (urinary tract infection)   Malnutrition of moderate degree   Acute metabolic encephalopathy Likely secondary to infection. Patient not very interactive today likely secondary to time of day. Was back to most recent baseline yesterday, but worsened today. IV fluids do not appear to have been running -NS IV fluids at 75 ml/hr -Continued goals of care discussions if unable to keep herself adequately hydrated  Urinary tract infection Urine culture with multiple species. Difficult to get proper history with regard to symptoms. No fevers. -Continue ceftriaxone; plan to treat for 3 days -Repeat urine culture  Acute kidney injury Baseline creatinine of 0.8 from one year ago. Creatinine of 1.55 with improvement to 1.15 today on IV fluids. In setting of dehydration. History of dysphagia and dementia -Continue IV fluids today -Encourage oral intake  Atrial fibrillation Tachy-brady syndrome Patient is s/p pace maker. She is also on Tikosyn, metoprolol, potassium supplementation and Pradaxa -Continue Tikosyn, metoprolol,  Pradaxa, potassium  Dementia Associated behavioral disturbances including sundowning. Currently on Seroquel and Namenda -Continue Seroquel/namenda  GERD -Continue Protonix  Essential hypertension Normotensive -Continue to hold Benicar, lasix and spironolactone in setting of AKI  Diastolic heart failure On lasix as an outpatient. Last EF of 55-60%  LE leg pain Likely musculoskeletal. Patient is on Pradaxa as an outpatient. No DVT on LE duplex  Moderate malnutrition In setting of dementia and dysphagia. Evaluated by dietitian.   DVT prophylaxis: Pradaxa Code Status:   Code Status: DNR Family Communication: Husband at bedside Disposition Plan: Discharge pending IV fluids and rehydration vs continued goals of care if patient is not able to keep herself adequately hydrated  Consultants:   Palliative care medicine  Procedures:   LE duplex (11/27/2018): No DVT  Antimicrobials:  Ceftriaxone    Subjective: Not able to tell me much today secondary to decreased mentation.  Objective: Vitals:   11/27/18 0616 11/27/18 1628 11/27/18 2203 11/28/18 0627  BP: 120/61 135/75 118/61 (!) 112/55  Pulse: 60 66 68 63  Resp: 17 15 16 18   Temp: 98.2 F (36.8 C) 98 F (36.7 C) 97.7 F (36.5 C) 97.7 F (36.5 C)  TempSrc: Oral Oral Oral Oral  SpO2: 98% 100% 98% 96%  Weight:      Height:        Intake/Output Summary (Last 24 hours) at 11/28/2018 1111 Last data filed at 11/28/2018 0631 Gross per 24 hour  Intake 380 ml  Output 700 ml  Net -320 ml   Filed Weights   11/26/18 1502  Weight: 57.3 kg    Examination:  General exam: Appears calm and comfortable HEENT: mouth appears very dry Respiratory system: Clear to auscultation. Respiratory effort normal. Cardiovascular system:  S1 & S2 heard, RRR. No murmurs, rubs, gallops or clicks. Gastrointestinal system: Abdomen is nondistended, soft and nontender. No organomegaly or masses felt. Normal bowel sounds heard. Central nervous  system: Alert and not oriented. Extremities: No edema. No calf tenderness Skin: No cyanosis. No rashes Psychiatry: Judgement and insight appear impaired.   Data Reviewed: I have personally reviewed following labs and imaging studies  CBC: Recent Labs  Lab 11/25/18 1539 11/26/18 0511  WBC 8.0 6.4  NEUTROABS 5.5  --   HGB 13.6 12.5  HCT 42.1 39.0  MCV 104.2* 104.8*  PLT 178 619   Basic Metabolic Panel: Recent Labs  Lab 11/25/18 1539 11/26/18 0511 11/27/18 0751  NA 137 138 139  K 4.9 3.8 4.6  CL 98 104 106  CO2 32 30 28  GLUCOSE 80 87 84  BUN 50* 39* 37*  CREATININE 1.55* 1.26* 1.15*  CALCIUM 8.9 8.5* 8.9  MG  --  2.1  --   PHOS  --  3.0  --    GFR: Estimated Creatinine Clearance: 26.4 mL/min (A) (by C-G formula based on SCr of 1.15 mg/dL (H)). Liver Function Tests: Recent Labs  Lab 11/25/18 1539 11/26/18 0511  AST 33 21  ALT 13 12  ALKPHOS 59 53  BILITOT 1.2 0.7  PROT 6.5 5.7*  ALBUMIN 2.7* 2.3*   No results for input(s): LIPASE, AMYLASE in the last 168 hours. No results for input(s): AMMONIA in the last 168 hours. Coagulation Profile: No results for input(s): INR, PROTIME in the last 168 hours. Cardiac Enzymes: No results for input(s): CKTOTAL, CKMB, CKMBINDEX, TROPONINI in the last 168 hours. BNP (last 3 results) No results for input(s): PROBNP in the last 8760 hours. HbA1C: No results for input(s): HGBA1C in the last 72 hours. CBG: No results for input(s): GLUCAP in the last 168 hours. Lipid Profile: No results for input(s): CHOL, HDL, LDLCALC, TRIG, CHOLHDL, LDLDIRECT in the last 72 hours. Thyroid Function Tests: Recent Labs    11/27/18 0751  TSH 2.216   Anemia Panel: No results for input(s): VITAMINB12, FOLATE, FERRITIN, TIBC, IRON, RETICCTPCT in the last 72 hours. Sepsis Labs: No results for input(s): PROCALCITON, LATICACIDVEN in the last 168 hours.  Recent Results (from the past 240 hour(s))  Urine culture     Status: Abnormal    Collection Time: 11/25/18  5:49 PM  Result Value Ref Range Status   Specimen Description   Final    URINE, RANDOM Performed at [facility] [address], [place], [place] [address]    Special Requests   Final    NONE Performed at [facility], [place] [address] [place], [place] [address]    Culture MULTIPLE SPECIES PRESENT, SUGGEST RECOLLECTION (A)  Final   Report Status 11/27/2018 FINAL  Final  MRSA PCR Screening     Status: None   Collection Time: 11/25/18 10:49 PM  Result Value Ref Range Status   MRSA by PCR NEGATIVE NEGATIVE Final    Comment:        The GeneXpert MRSA Assay (FDA approved for NASAL specimens only), is one component of a comprehensive MRSA colonization surveillance program. It is not intended to diagnose MRSA infection nor to guide or monitor treatment for MRSA infections. Performed at [facility], [place] [address] [place], [place] [address]          Radiology Studies: Vas [place] Lower Extremity Venous (dvt)  Result Date: 11/27/2018  Lower Venous Study Indications: Pain.  Performing Technologist: Oliver Hum RVT  Examination Guidelines: A complete evaluation includes B-mode imaging, spectral Doppler, color Doppler, and power Doppler as needed of all accessible portions of each vessel. Bilateral testing is considered an integral part of a complete examination. Limited examinations for reoccurring indications may be performed as noted.  Right Venous Findings: +---+---------------+---------+-----------+----------+-------+    CompressibilityPhasicitySpontaneityPropertiesSummary +---+---------------+---------+-----------+----------+-------+ CFVFull           Yes      Yes                          +---+---------------+---------+-----------+----------+-------+  Left Venous Findings: +---------+---------------+---------+-----------+----------+-------+           CompressibilityPhasicitySpontaneityPropertiesSummary +---------+---------------+---------+-----------+----------+-------+ CFV      Full           Yes      Yes                          +---------+---------------+---------+-----------+----------+-------+ SFJ      Full                                                 +---------+---------------+---------+-----------+----------+-------+ FV Prox  Full                                                 +---------+---------------+---------+-----------+----------+-------+ FV Mid   Full                                                 +---------+---------------+---------+-----------+----------+-------+ FV DistalFull                                                 +---------+---------------+---------+-----------+----------+-------+ PFV      Full                                                 +---------+---------------+---------+-----------+----------+-------+ POP      Full           Yes      Yes                          +---------+---------------+---------+-----------+----------+-------+ PTV      Full                                                 +---------+---------------+---------+-----------+----------+-------+ PERO     Full                                                 +---------+---------------+---------+-----------+----------+-------+  Summary: Right: No evidence of common femoral vein obstruction. Left: There is no evidence of deep vein thrombosis in the lower extremity. No cystic structure found in the popliteal fossa.  *See table(s) above for measurements and observations. Electronically signed by Harold Barban MD on 11/27/2018 at 6:49:26 PM.    Final         Scheduled Meds: . dabigatran  75 mg Oral Q12H  . diphenhydrAMINE  25 mg Intravenous Once  . divalproex  250 mg Oral BID AC  . divalproex  500 mg Oral QHS  . dofetilide  250 mcg Oral BID  . dorzolamide-timolol  1 drop Right Eye BID   . fesoterodine  4 mg Oral Daily  . guaiFENesin  600 mg Oral BID  . latanoprost  1 drop Right Eye QHS  . lip balm      . memantine  10 mg Oral BID  . metoprolol tartrate  25 mg Oral BID  . mirabegron ER  50 mg Oral Daily  . pantoprazole  40 mg Oral Daily  . potassium chloride  10 mEq Oral BID  . QUEtiapine  25 mg Oral QPM  . QUEtiapine  50 mg Oral QHS   Continuous Infusions: . sodium chloride    . cefTRIAXone (ROCEPHIN)  IV 1 g (11/28/18 1025)     LOS: 2 days     Cordelia Poche, MD [facility] 11/28/2018, 11:11 AM  If 7PM-7AM, please contact night-coverage [email or web address]

## 2018-11-28 NOTE — Progress Notes (Signed)
Daily Progress Note   Patient Name: [name]       Date: 11/28/2018 DOB: [birth year]  Age: 85 y.o. MRN#: [id number] Attending Physician: Jacqueline Aloe, MD Primary Care Physician: Jacqueline Hibbs, NP Admit Date: 11/25/2018  Reason for Consultation/Follow-up: Establishing goals of care  Subjective: I met today with patient, her daughter, and her husband.  [name] looks much worse clinically and does not arouse today.  Discussed with her husband and daughter regarding plan for continued supportive care to see how she responds.  Her husband and daughter report that they feel that she is likely not going to improve, and they asked regarding next steps if this is the case.  We discussed options of hospice, including residential hospice, per family request depending on her clinical course.     Length of Stay: 2  Current Medications: Scheduled Meds:  . dabigatran  75 mg Oral Q12H  . diphenhydrAMINE  25 mg Intravenous Once  . divalproex  250 mg Oral BID AC  . divalproex  500 mg Oral QHS  . dofetilide  250 mcg Oral BID  . dorzolamide-timolol  1 drop Right Eye BID  . fesoterodine  4 mg Oral Daily  . guaiFENesin  600 mg Oral BID  . latanoprost  1 drop Right Eye QHS  . memantine  10 mg Oral BID  . metoprolol tartrate  25 mg Oral BID  . mirabegron ER  50 mg Oral Daily  . pantoprazole  40 mg Oral Daily  . potassium chloride  10 mEq Oral BID  . QUEtiapine  25 mg Oral QPM  . QUEtiapine  50 mg Oral QHS    Continuous Infusions: . sodium chloride    . cefTRIAXone (ROCEPHIN)  IV 1 g (11/28/18 1025)    PRN Meds: acetaminophen **OR** acetaminophen, albuterol, HYDROcodone-acetaminophen, LORazepam, ondansetron **OR** ondansetron (ZOFRAN) IV  Physical Exam         General: Sleepy, but  alert, awake, with stimulation.  HEENT: No bruits, no goiter, no JVD Heart: Regular rate and rhythm. No murmur appreciated. Lungs: Good air movement, clear Abdomen: Soft, nontender, nondistended, positive bowel sounds.  Ext: No significant edema  Vital Signs: BP (!) 112/55 (BP Location: Left Arm)   Pulse 63   Temp 97.7 F (36.5 C) (Oral)   Resp 18  Ht _0  (1.6 m)   Wt 57.3 kg   SpO2 96%   BMI 22.38 kg/m  SpO2: SpO2: 96 % O2 Device: O2 Device: Nasal Cannula O2 Flow Rate: O2 Flow Rate (L/min): 1 L/min  Intake/output summary:   Intake/Output Summary (Last 24 hours) at 11/28/2018 1055 Last data filed at 11/28/2018 0631 Gross per 24 hour  Intake 380 ml  Output 700 ml  Net -320 ml   LBM: Last BM Date: (PTA) Baseline Weight: Weight: 57.3 kg Most recent weight: Weight: 57.3 kg       Palliative Assessment/Data:    Flowsheet Rows     Most Recent Value  Intake Tab  Referral Department  Hospitalist  Unit at Time of Referral  ER  Palliative Care Primary Diagnosis  Neurology  Date Notified  11/26/18  Palliative Care Type  New Palliative care  Reason for referral  Clarify Goals of Care  Date of Admission  11/25/18  Date first seen by Palliative Care  11/26/18  # of days Palliative referral response time  0 Day(s)  # of days IP prior to Palliative referral  1  Clinical Assessment  Psychosocial & Spiritual Assessment  Palliative Care Outcomes  Patient/Family meeting held?  Yes  Who was at the meeting?  Husband      Patient Active Problem List   Diagnosis Date Noted  . UTI (urinary tract infection) 11/26/2018  . Dehydration 11/25/2018  . Acute lower UTI 11/25/2018  . AKI (acute kidney injury) ([place]) 11/25/2018  . Acute encephalopathy 11/25/2018  . Venous stasis ulcer ([place]) 11/25/2018  . Dementia with behavioral disturbance ([place]) 11/25/2018  . HTN (hypertension) 05/05/2015  . Fatigue 11/02/2014  . Acute on chronic diastolic heart failure ([place]) 05/26/2013  .  Pacemaker-St.Jude 07/03/2012  . Hyponatremia 09/18/2011  . Dysphagia 07/23/2011  . Anticoagulant long-term use 06/28/2011  . Tachycardia-bradycardia ([place]) 02/06/2011  . Diastolic CHF, chronic ([place]) 02/06/2011  . Atrial fibrillation ([place]) 02/01/2011  . Hypertension   . Renal artery stenosis ([place])   . GERD (gastroesophageal reflux disease)     Palliative Care Assessment & Plan   Patient Profile:  84 y.o. female  with past medical history of CHF, a fib, dysphagia, GERD, hiatal hernia, HTN, hypothyroid, pacemaker, seizure d/o, SIADH, tachybrady, dementia with worsened behaviors recently moved from ALF to SNF care at [facility] admitted on 11/25/2018 with decreased mental status.     Recommendations/Plan:  Clinically, [name] appears worse today.  Discussed with family and plan to continue current interventions and plan to meet again tomorrow at 1300.  Code Status:    Code Status Orders  (From admission, onward)         Start     Ordered   11/25/18 2157  Do not attempt resuscitation (DNR)  Continuous    Question Answer Comment  In the event of cardiac or respiratory ARREST Do not call a "code blue"   In the event of cardiac or respiratory ARREST Do not perform Intubation, CPR, defibrillation or ACLS   In the event of cardiac or respiratory ARREST Use medication by any route, position, wound care, and other measures to relive pain and suffering. May use oxygen, suction and manual treatment of airway obstruction as needed for comfort.      11/25/18 2157        Code Status History    Date Active Date Inactive Code Status Order ID Comments User Context   05/28/2013 1928 05/31/2013 1529 Full Code [id number]  Ileene Hutchinson  Jenetta Downer, PA-C Inpatient    Advance Directive Documentation     Most Recent Value  Type of Advance Directive  Healthcare Power of Attorney, Living will  Pre-existing out of facility DNR order (yellow form or pink MOST form)  -  "MOST" Form in Place?  -        Prognosis:   Unable to determine  Discharge Planning:  To Be Determined  Care plan was discussed with husband  Thank you for allowing the Palliative Medicine Team to assist in the care of this patient.   Total Time 40 Prolonged Time Billed No      Greater than 50%  of this time was spent counseling and coordinating care related to the above assessment and plan.  Jacqueline Rough, MD  Please contact Palliative Medicine Team phone at [phone] for questions and concerns.

## 2018-11-28 NOTE — Progress Notes (Signed)
SLP Cancellation Note  Patient Details Name: ALAYNNA KERWOOD MRN: [id number] DOB: 1924-11-07   Cancelled treatment:       Reason Eval/Treat Not Completed: Other (comment)(pt lethargic )   Macario Golds 11/28/2018, 5:32 PM  Luanna Salk, [facility] SLP [place] Pager [phone] Office [phone]

## 2018-11-29 MED ORDER — HALOPERIDOL 0.5 MG PO TABS: 0.5000 mg | ORAL_TABLET | ORAL | Status: DC | PRN

## 2018-11-29 MED ORDER — POLYVINYL ALCOHOL 1.4 % OP SOLN: 1.0000 [drp] | Freq: Four times a day (QID) | OPHTHALMIC | Status: DC | PRN

## 2018-11-29 MED ORDER — LORAZEPAM 2 MG/ML IJ SOLN: 1.0000 mg | INTRAMUSCULAR | Status: DC | PRN

## 2018-11-29 MED ORDER — HALOPERIDOL LACTATE 5 MG/ML IJ SOLN: 0.5000 mg | INTRAMUSCULAR | Status: DC | PRN

## 2018-11-29 MED ORDER — GLYCOPYRROLATE 1 MG PO TABS: 1.0000 mg | ORAL_TABLET | ORAL | Status: DC | PRN

## 2018-11-29 MED ORDER — HALOPERIDOL LACTATE 2 MG/ML PO CONC
0.5000 mg | ORAL | Status: DC | PRN
  Filled 2018-11-29: qty 0.3

## 2018-11-29 MED ORDER — BIOTENE DRY MOUTH MT LIQD: 15.0000 mL | OROMUCOSAL | Status: DC | PRN

## 2018-11-29 MED ORDER — ACETAMINOPHEN 325 MG PO TABS: 650.0000 mg | ORAL_TABLET | Freq: Four times a day (QID) | ORAL | Status: DC | PRN

## 2018-11-29 MED ORDER — GLYCOPYRROLATE 0.2 MG/ML IJ SOLN: 0.2000 mg | INTRAMUSCULAR | Status: DC | PRN

## 2018-11-29 MED ORDER — ACETAMINOPHEN 650 MG RE SUPP: 650.0000 mg | Freq: Four times a day (QID) | RECTAL | Status: DC | PRN

## 2018-11-29 MED ORDER — MORPHINE SULFATE (PF) 2 MG/ML IV SOLN: 2.0000 mg | INTRAVENOUS | Status: DC | PRN

## 2018-11-29 MED ORDER — LORAZEPAM 1 MG PO TABS: 1.0000 mg | ORAL_TABLET | ORAL | Status: DC | PRN

## 2018-11-29 MED ORDER — LORAZEPAM 2 MG/ML PO CONC: 1.0000 mg | ORAL | Status: DC | PRN

## 2018-11-29 NOTE — Care Management Important Message (Signed)
Important Message  Patient Details  Name: CATHERIN DOORN MRN: [id number] Date of Birth: [birth year]   Medicare Important Message Given:  Yes    Kerin Salen 11/29/2018, 10:11 AMImportant Message  Patient Details  Name: MARDEL GRUDZIEN MRN: [id number] Date of Birth: [birth year]   Medicare Important Message Given:  Yes    Kerin Salen 11/29/2018, 10:10 AM

## 2018-11-29 NOTE — Progress Notes (Signed)
PROGRESS NOTE    [name]  XTG:[id number] DOB: October 05, 1924 DOA: 11/25/2018 PCP: Ferd Hibbs, NP   Brief Narrative: Patient is a 85 year old female with history of dementia, systolic CHF, atrial fibrillation, dysphagia, GERD, hiatal hernia, hypertension, hypothyroidism, status post pacemaker, seizure disorder, tachy- bradycardia syndrome who was brought here for the evaluation of lethargy, hallucinations, encephalopathy.  There was concern for urinary tract infection.  Started on antibiotics.  Since her mental status and oral intake have not significantly improved, Palliative  care was consulted.  Started on full comfort care now.  Plan is to discharge her to residential hospice.  Assessment & Plan:   Active Problems:   Hypertension   GERD (gastroesophageal reflux disease)   Atrial fibrillation (HCC)   Tachycardia-bradycardia (HCC)   Diastolic CHF, chronic (HCC)   Dysphagia   Pacemaker-St.Jude   Dehydration   Acute lower UTI   AKI (acute kidney injury) ([place])   Acute encephalopathy   Venous stasis ulcer ([place])   Dementia with behavioral disturbance (HCC)   UTI (urinary tract infection)   Malnutrition of moderate degree   Acute metabolic encephalopathy: Suspected to be from urinary tract infection initially.  Her mental status waxes and wanes.  Hardly communicative.  UTI: Culture sent here showed multiple species.  On ceftriaxone.  We might  discontinue antibiotics because the patient is on full comfort care.  Acute kidney injury: Suspected to be dehydrated on presentation.  Was on IV fluids.  IV fluids might be stopped  Atrial fibrillation/tachybradycardia syndrome: Status post pacemaker.  On Tikosyn, metoprolol, Pradaxa  Dementia: On Seroquel and Namenda.  Dementia is pretty advanced.  Hypertension: Currently normotensive.  Antihypertensives on hold.  Diastolic heart failure: Appears slightly volume overloaded.  Last EF of 55 to 60%.  Moderate malnutrition: Seen by  dietitian.  GERD: Continue Protonix   Nutrition Problem: Moderate Malnutrition Etiology: chronic illness(dementia)      DVT prophylaxis:Pradaxa Code Status: DNR Family Communication: Family Disposition Plan: Residential hospice   Consultants: Palliative care  Procedures: None  Antimicrobials:  Anti-infectives (From admission, onward)   Start     Dose/Rate Route Frequency Ordered Stop   12/02/18 2230  cefTRIAXone (ROCEPHIN) 1 g in sodium chloride 0.9 % 100 mL IVPB  Status:  Discontinued     1 g 200 mL/hr over 30 Minutes Intravenous Every 24 hours 11/25/18 2157 11/27/18 0947   11/27/18 1000  cefTRIAXone (ROCEPHIN) 1 g in sodium chloride 0.9 % 100 mL IVPB     1 g 200 mL/hr over 30 Minutes Intravenous Every 24 hours 11/27/18 0947        Subjective: Patient seen and examined the bedside this afternoon.  Appears ill looking, weak and frail.  Hardly communicative.  Continues to have poor oral intake  Objective: Vitals:   11/28/18 0627 11/28/18 1315 11/29/18 0026 11/29/18 0546  BP: (!) 112/55 (!) 121/56 (!) 114/53 (!) 97/46  Pulse: 63 61 (!) 59 (!) 59  Resp: 18 16 17 17   Temp: 97.7 F (36.5 C) 98.2 F (36.8 C) 97.8 F (36.6 C) 98.4 F (36.9 C)  TempSrc: Oral Oral Oral Oral  SpO2: 96% 98% 98% 99%  Weight:      Height:        Intake/Output Summary (Last 24 hours) at 11/29/2018 1342 Last data filed at 11/29/2018 0725 Gross per 24 hour  Intake 1306.24 ml  Output 600 ml  Net 706.24 ml   Filed Weights   11/26/18 1502  Weight: 57.3 kg  Examination:  General exam: Ill-looking, generalized weakness, frail elderly lady  HEENT:PERRL,Oral mucosa dry, Ear/Nose normal on gross exam Respiratory system: Bilateral decreased air entry on the bases Cardiovascular system: S1 & S2 heard, RRR. No JVD, murmurs, rubs, gallops or clicks.  Gastrointestinal system: Abdomen is nondistended, soft and nontender. No organomegaly or masses felt. Normal bowel sounds heard. Central  nervous system: Not alert and oriented. No focal neurological deficits. Extremities: Edema on bilateral upper extremities, no clubbing ,no cyanosis, distal peripheral pulses palpable. Skin: Scattered purpura   Data Reviewed: I have personally reviewed following labs and imaging studies  CBC: Recent Labs  Lab 11/25/18 1539 11/26/18 0511  WBC 8.0 6.4  NEUTROABS 5.5  --   HGB 13.6 12.5  HCT 42.1 39.0  MCV 104.2* 104.8*  PLT 178 992   Basic Metabolic Panel: Recent Labs  Lab 11/25/18 1539 11/26/18 0511 11/27/18 0751  NA 137 138 139  K 4.9 3.8 4.6  CL 98 104 106  CO2 32 30 28  GLUCOSE 80 87 84  BUN 50* 39* 37*  CREATININE 1.55* 1.26* 1.15*  CALCIUM 8.9 8.5* 8.9  MG  --  2.1  --   PHOS  --  3.0  --    GFR: Estimated Creatinine Clearance: 26.4 mL/min (A) (by C-G formula based on SCr of 1.15 mg/dL (H)). Liver Function Tests: Recent Labs  Lab 11/25/18 1539 11/26/18 0511  AST 33 21  ALT 13 12  ALKPHOS 59 53  BILITOT 1.2 0.7  PROT 6.5 5.7*  ALBUMIN 2.7* 2.3*   No results for input(s): LIPASE, AMYLASE in the last 168 hours. No results for input(s): AMMONIA in the last 168 hours. Coagulation Profile: No results for input(s): INR, PROTIME in the last 168 hours. Cardiac Enzymes: No results for input(s): CKTOTAL, CKMB, CKMBINDEX, TROPONINI in the last 168 hours. BNP (last 3 results) No results for input(s): PROBNP in the last 8760 hours. HbA1C: No results for input(s): HGBA1C in the last 72 hours. CBG: No results for input(s): GLUCAP in the last 168 hours. Lipid Profile: No results for input(s): CHOL, HDL, LDLCALC, TRIG, CHOLHDL, LDLDIRECT in the last 72 hours. Thyroid Function Tests: Recent Labs    11/27/18 0751  TSH 2.216   Anemia Panel: No results for input(s): VITAMINB12, FOLATE, FERRITIN, TIBC, IRON, RETICCTPCT in the last 72 hours. Sepsis Labs: No results for input(s): PROCALCITON, LATICACIDVEN in the last 168 hours.  Recent Results (from the past 240  hour(s))  Urine culture     Status: Abnormal   Collection Time: 11/25/18  5:49 PM  Result Value Ref Range Status   Specimen Description   Final    URINE, RANDOM Performed at [facility] [address], [place], [place] [address]    Special Requests   Final    NONE Performed at [facility], [place] [address] [place], [place] [address]    Culture MULTIPLE SPECIES PRESENT, SUGGEST RECOLLECTION (A)  Final   Report Status 11/27/2018 FINAL  Final  MRSA PCR Screening     Status: None   Collection Time: 11/25/18 10:49 PM  Result Value Ref Range Status   MRSA by PCR NEGATIVE NEGATIVE Final    Comment:        The GeneXpert MRSA Assay (FDA approved for NASAL specimens only), is one component of a comprehensive MRSA colonization surveillance program. It is not intended to diagnose MRSA infection nor to guide or monitor treatment for MRSA infections. Performed at [facility], [place] [address] [place],  [address]          Radiology Studies: Vas [place] Lower Extremity Venous (dvt)  Result Date: 11/27/2018  Lower Venous Study Indications: Pain.  Performing Technologist: Oliver Hum RVT  Examination Guidelines: A complete evaluation includes B-mode imaging, spectral Doppler, color Doppler, and power Doppler as needed of all accessible portions of each vessel. Bilateral testing is considered an integral part of a complete examination. Limited examinations for reoccurring indications may be performed as noted.  Right Venous Findings: +---+---------------+---------+-----------+----------+-------+    CompressibilityPhasicitySpontaneityPropertiesSummary +---+---------------+---------+-----------+----------+-------+ CFVFull           Yes      Yes                          +---+---------------+---------+-----------+----------+-------+  Left Venous Findings: +---------+---------------+---------+-----------+----------+-------+           CompressibilityPhasicitySpontaneityPropertiesSummary +---------+---------------+---------+-----------+----------+-------+ CFV      Full           Yes      Yes                          +---------+---------------+---------+-----------+----------+-------+ SFJ      Full                                                 +---------+---------------+---------+-----------+----------+-------+ FV Prox  Full                                                 +---------+---------------+---------+-----------+----------+-------+ FV Mid   Full                                                 +---------+---------------+---------+-----------+----------+-------+ FV DistalFull                                                 +---------+---------------+---------+-----------+----------+-------+ PFV      Full                                                 +---------+---------------+---------+-----------+----------+-------+ POP      Full           Yes      Yes                          +---------+---------------+---------+-----------+----------+-------+ PTV      Full                                                 +---------+---------------+---------+-----------+----------+-------+ PERO     Full                                                 +---------+---------------+---------+-----------+----------+-------+  Summary: Right: No evidence of common femoral vein obstruction. Left: There is no evidence of deep vein thrombosis in the lower extremity. No cystic structure found in the popliteal fossa.  *See table(s) above for measurements and observations. Electronically signed by Harold Barban MD on 11/27/2018 at 6:49:26 PM.    Final         Scheduled Meds: . dabigatran  75 mg Oral Q12H  . diphenhydrAMINE  25 mg Intravenous Once  . divalproex  250 mg Oral BID AC  . divalproex  500 mg Oral QHS  . dofetilide  250 mcg Oral BID  . dorzolamide-timolol  1 drop  Right Eye BID  . fesoterodine  4 mg Oral Daily  . guaiFENesin  600 mg Oral BID  . latanoprost  1 drop Right Eye QHS  . memantine  10 mg Oral BID  . metoprolol tartrate  25 mg Oral BID  . mirabegron ER  50 mg Oral Daily  . pantoprazole  40 mg Oral Daily  . potassium chloride  10 mEq Oral BID  . QUEtiapine  25 mg Oral QPM  . QUEtiapine  50 mg Oral QHS   Continuous Infusions: . sodium chloride 75 mL/hr at 11/29/18 0600  . cefTRIAXone (ROCEPHIN)  IV 1 g (11/29/18 1047)     LOS: 3 days    Time spent:35 mins. More than 50% of that time was spent in counseling and/or coordination of care.      Shelly Coss, MD [facility] Pager [phone]  If 7PM-7AM, please contact night-coverage [email or web address] Password [email or web address] 11/29/2018, 1:42 PM

## 2018-11-29 NOTE — Progress Notes (Signed)
As of this am ,Pt has slept.often hard to arouse, then drifts off. No po med's given due to somnolent most of this day. She aroused.  The most when given a bath or when foam pads were changed to the back of her rt calf. This are was a circle that was freely bleeding. Area was cleaned  and dried and a foam pad was applied. Upon turning her on her side for an assessment it was noted  that she has pressure wrinkles form lying on the bed pads. 4 are red in the wrinkles. Faom pads were applied. There was no skin broken.

## 2018-11-29 NOTE — Progress Notes (Signed)
Daily Progress Note   Patient Name: [name]       Date: 11/29/2018 DOB: [birth year]  Age: 85 y.o. MRN#: [id number] Attending Physician: Shelly Coss, MD Primary Care Physician: Ferd Hibbs, NP Admit Date: 11/25/2018  Reason for Consultation/Follow-up: Establishing goals of care  Subjective: I met today with patient's daughter and husband.  We discussed clinical course as well as wishes moving forward in regard to her continued decline.  We discussed difference between a aggressive medical intervention path and a palliative, comfort focused care path.    Family reports understanding that she is approaching end of life.  Their goal moving forward is comfort and dignity and they are hopeful for residential hospice for EOL care.   Length of Stay: 3  Current Medications: Scheduled Meds:  . dabigatran  75 mg Oral Q12H  . diphenhydrAMINE  25 mg Intravenous Once  . divalproex  250 mg Oral BID AC  . divalproex  500 mg Oral QHS  . dofetilide  250 mcg Oral BID  . dorzolamide-timolol  1 drop Right Eye BID  . fesoterodine  4 mg Oral Daily  . guaiFENesin  600 mg Oral BID  . latanoprost  1 drop Right Eye QHS  . memantine  10 mg Oral BID  . metoprolol tartrate  25 mg Oral BID  . mirabegron ER  50 mg Oral Daily  . pantoprazole  40 mg Oral Daily  . potassium chloride  10 mEq Oral BID  . QUEtiapine  25 mg Oral QPM  . QUEtiapine  50 mg Oral QHS    Continuous Infusions: . sodium chloride 75 mL/hr at 11/29/18 0600  . cefTRIAXone (ROCEPHIN)  IV 1 g (11/29/18 1047)    PRN Meds: acetaminophen **OR** acetaminophen, albuterol, HYDROcodone-acetaminophen, LORazepam, morphine injection, ondansetron **OR** ondansetron (ZOFRAN) IV  Physical Exam         General: Sleeping. HEENT: No bruits,  no goiter, no JVD Heart: Regular rate and rhythm. No murmur appreciated. Lungs: Good air movement, clear Abdomen: Soft, nontender, nondistended, positive bowel sounds.  Ext: No significant edema  Vital Signs: BP (!) 89/61 (BP Location: Left Arm)   Pulse 91   Temp 97.7 F (36.5 C) (Oral)   Resp 15   Ht '5\' 3"'$  (1.6 m)   Wt 57.3 kg   SpO2 99%  BMI 22.38 kg/m  SpO2: SpO2: 99 % O2 Device: O2 Device: Nasal Cannula O2 Flow Rate: O2 Flow Rate (L/min): 1 L/min  Intake/output summary:   Intake/Output Summary (Last 24 hours) at 11/29/2018 1408 Last data filed at 11/29/2018 0725 Gross per 24 hour  Intake 1306.24 ml  Output 600 ml  Net 706.24 ml   LBM: Last BM Date: 11/27/18 Baseline Weight: Weight: 57.3 kg Most recent weight: Weight: 57.3 kg       Palliative Assessment/Data:    Flowsheet Rows     Most Recent Value  Intake Tab  Referral Department  Hospitalist  Unit at Time of Referral  ER  Palliative Care Primary Diagnosis  Neurology  Date Notified  11/26/18  Palliative Care Type  New Palliative care  Reason for referral  Clarify Goals of Care  Date of Admission  11/25/18  Date first seen by Palliative Care  11/26/18  # of days Palliative referral response time  0 Day(s)  # of days IP prior to Palliative referral  1  Clinical Assessment  Psychosocial & Spiritual Assessment  Palliative Care Outcomes  Patient/Family meeting held?  Yes  Who was at the meeting?  Husband      Patient Active Problem List   Diagnosis Date Noted  . Malnutrition of moderate degree 11/28/2018  . UTI (urinary tract infection) 11/26/2018  . Dehydration 11/25/2018  . Acute lower UTI 11/25/2018  . AKI (acute kidney injury) ([place]) 11/25/2018  . Acute encephalopathy 11/25/2018  . Venous stasis ulcer ([place]) 11/25/2018  . Dementia with behavioral disturbance ([facility]) 11/25/2018  . HTN (hypertension) 05/05/2015  . Fatigue 11/02/2014  . Acute on chronic diastolic heart failure ([place]) 05/26/2013  .  Pacemaker-St.Jude 07/03/2012  . Hyponatremia 09/18/2011  . Dysphagia 07/23/2011  . Anticoagulant long-term use 06/28/2011  . Tachycardia-bradycardia ([place]) 02/06/2011  . Diastolic CHF, chronic ([place]) 02/06/2011  . Atrial fibrillation ([place]) 02/01/2011  . Hypertension   . Renal artery stenosis ([place])   . GERD (gastroesophageal reflux disease)     Palliative Care Assessment & Plan   Patient Profile:  85 y.o. female  with past medical history of CHF, a fib, dysphagia, GERD, hiatal hernia, HTN, hypothyroid, pacemaker, seizure d/o, SIADH, tachybrady, dementia with worsened behaviors recently moved from ALF to SNF care at [facility] admitted on 11/25/2018 with decreased mental status.     Recommendations/Plan:  Clinically, [name] continues to appear worse today.  I met today with her family, including her daughter and husband. They report that she has had 2 small sips of water since this morning.  No significant documented intake in chart.  Family reports that her desire would be to focus only on comfort and dignity moving forward.  They are hopeful for placement at [facility] for end of life care.  Code Status:    Code Status Orders  (From admission, onward)         Start     Ordered   11/25/18 2157  Do not attempt resuscitation (DNR)  Continuous    Question Answer Comment  In the event of cardiac or respiratory ARREST Do not call a "code blue"   In the event of cardiac or respiratory ARREST Do not perform Intubation, CPR, defibrillation or ACLS   In the event of cardiac or respiratory ARREST Use medication by any route, position, wound care, and other measures to relive pain and suffering. May use oxygen, suction and manual treatment of airway obstruction as needed for comfort.  11/25/18 2157        Code Status History    Date Active Date Inactive Code Status Order ID Comments User Context   05/28/2013 1928 05/31/2013 1529 Full Code [id number]  Andrez Grime, PA-C Inpatient     Advance Directive Documentation     Most Recent Value  Type of Advance Directive  Healthcare Power of [place], Living will  Pre-existing out of facility DNR order (yellow form or pink MOST form)  -  "MOST" Form in Place?  -       Prognosis:   < 2 weeks- She continues to decline with increasing somnolence and no real PO intake.  I believe her prognosis at this point is < 2 weeks.  Discharge Planning:  Hospice facility  Care plan was discussed with husband and daugther  Thank you for allowing the Palliative Medicine Team to assist in the care of this patient.   Total Time 40 Prolonged Time Billed No      Greater than 50%  of this time was spent counseling and coordinating care related to the above assessment and plan.  Micheline Rough, MD  Please contact Palliative Medicine Team phone at [phone] for questions and concerns.

## 2018-11-29 NOTE — Progress Notes (Signed)
  Speech Language Pathology Treatment: Dysphagia  Patient Details Name: [name] MRN: [id number] DOB: [birth year] Today's Date: 11/29/2018 Time: 6811-5726 SLP Time Calculation (min) (ACUTE ONLY): 23 min  Assessment / Plan / Recommendation Clinical Impression  ST follow up for therapeutic diet tolerance.  Chart review indicated that the patient has been afebrile.  Her daughter was present at the bedside and reported she was unable to wake her mom for breakfast this AM.  The patient did rouse to her name and gentle physical touch.   Provided oral care using a toothbrush and toothpaste, however, the patient was unable to follow any commands to swish and spit, therefore, material was removed from her oral cavity using green swabs.  May need to consider setting up suction to allow for proper oral care until she is more able to follow commands.  Presented the patient with an ice chip and she made no attempt to manipulate the material.  It was removed from her oral cavity.  Her daughter was educated to hold off feedings unless she was totally awake. Informed nursing of the patient's current situation and that she may have difficulty taking her medications while she is so lethargic. Per her daughter a meeting with palliative care is set up for this afternoon.  ST will follow pending decisions regarding goals of care to determine if continued ST involvement is desired.     HPI HPI: 85 yo female adm to [facility] with AMS, lethargy, hallucinations - acute encephalopathy and concern for possible UTI.  Pt PMH + for dementa - recently moved from ALF to SNF at facility approximately one month ago - spouse reports pt has gone down hill.  PMH also + for seizure disorder, SIADH, tachybradycardia syndrome.  Swallow eval ordered.        SLP Plan  Continue with current plan of care;Other (Comment)(pending goals of care from palliative care meeting)       Recommendations  Diet recommendations: Dysphagia 3 (mechanical  soft);Thin liquid(only if awake) Liquids provided via: Cup Medication Administration: Whole meds with puree Supervision: Full supervision/cueing for compensatory strategies Compensations: Slow rate;Small sips/bites;Other (Comment)(Feed only if awake, start meal with drink and drink t/o) Postural Changes and/or Swallow Maneuvers: Seated upright 90 degrees;Upright 30-60 min after meal                Oral Care Recommendations: Oral care BID Follow up Recommendations: None SLP Visit Diagnosis: Dysphagia, unspecified (R13.10) Plan: Continue with current plan of care;Other (Comment)(pending goals of care from palliative care meeting)       [place], [place], [facility] [facility] SLP [phone]  Lamar Sprinkles 11/29/2018, 10:20 AM

## 2018-11-30 NOTE — Progress Notes (Addendum)
CSW received a call from provider alerting CSW that [facility] was updated as to pt's family's interest.  10:47 AM CSW spoke to pt's spouse who gave verbal permission for CSW to connect pt's spouse with Hospice rep.  CSW spoke to rep who is aware and will speak to family.  CSW will continue to follow for D/C needs.  Alphonse Guild. Kaitlyne Friedhoff, LCSW, LCAS, CSI Transitions of Care Clinical Social Worker Care Coordination Department Ph: [phone]

## 2018-11-30 NOTE — Progress Notes (Signed)
CSW received a call from [name] has been accepted by: [name] Number for report is: [phone] Pt's unit/room/bed number will be: TBD Accepting physician: Dr. Tomasa Whitney   Pt can arrive ASAP on 11/30/2018  CSW will update RN.  [name]. Jacqueline Nanda, LCSW, LCAS, CSI Clinical Social Worker Ph: [phone]

## 2018-11-30 NOTE — Progress Notes (Addendum)
Patient discharged to [facility]. To be transported via [facility].  Discharge instructions sent to facility. Report called to receiving RN.

## 2018-11-30 NOTE — Progress Notes (Addendum)
D/C summary faxed to [facility] at ph: [phone].  CSW spoke to RN [phone] and will bring D/C packet and call packet.  D/C packet provided to RN/[facility] called.  Please reconsult if future social work needs arise.  CSW signing off, as social work intervention is no longer needed.  Alphonse Guild. Lavert Matousek, LCSW, LCAS, CSI Transitions of Care Clinical Social Worker Care Coordination Department Ph: [phone]

## 2018-11-30 NOTE — Progress Notes (Signed)
[facility] 1602--[facility] Careers information officer Visit for [employer] at [employer]  Received request from [place], [place] for family interest in [facility] with request to transfer today. Chart reviewed and received report from PMT MD, Gene Domingo Cocking. Met with patient's husband to confirm interest and explain services. Family agreeable to transfer today. Roderic Palau, LCSW aware. Registration paper work completed. Dr. Orpah Melter to assume care per family request.  Please fax discharge summary to [phone].  RN please call report to [phone].  Please arrange for transport to arrive as early in day as possible.  Thank you, Margaretmary Eddy, RN, BSN [place] [phone]  [place] are on [place].

## 2018-11-30 NOTE — Progress Notes (Signed)
PROGRESS NOTE    [name]  INO:[id number] DOB: 07-28-1925 DOA: 11/25/2018 PCP: Ferd Hibbs, NP   Brief Narrative: Patient is a 85 year old female with history of dementia, systolic CHF, atrial fibrillation, dysphagia, GERD, hiatal hernia, hypertension, hypothyroidism, status post pacemaker, seizure disorder, tachy- bradycardia syndrome who was brought here for the evaluation of lethargy, hallucinations, encephalopathy.  There was concern for urinary tract infection.  Started on antibiotics.  Since her mental status and oral intake did not  improved, Palliative  care was consulted.  Started on full comfort care now.  Plan is to discharge her to residential hospice as soon as bed is available.  Assessment & Plan:   Active Problems:   Hypertension   GERD (gastroesophageal reflux disease)   Atrial fibrillation (HCC)   Tachycardia-bradycardia (HCC)   Diastolic CHF, chronic (HCC)   Dysphagia   Pacemaker-St.Jude   Dehydration   Acute lower UTI   AKI (acute kidney injury) ([place])   Acute encephalopathy   Venous stasis ulcer ([place])   Dementia with behavioral disturbance (HCC)   UTI (urinary tract infection)   Malnutrition of moderate degree   Acute metabolic encephalopathy: Suspected to be from urinary tract infection initially.  Her mental status continues to deteriorate.  Hardly responsive this morning.  UTI: Culture sent here showed multiple species. Was On ceftriaxone.  We discontinued antibiotics because the patient is on full comfort care.  Acute kidney injury: Suspected to be dehydrated on presentation.  On gentle IV fluids  Atrial fibrillation/tachybradycardia syndrome: Status post pacemaker.  On Tikosyn, metoprolol, Pradaxa  Dementia: On Seroquel and Namenda.  Dementia is pretty advanced.  Hypertension: Currently normotensive.  Antihypertensives on hold.  Diastolic heart failure: Appears slightly volume overloaded.  Last EF of 55 to 60%.  Moderate malnutrition: Seen by  dietitian.  Continues to have poor oral intake.    Nutrition Problem: Moderate Malnutrition Etiology: chronic illness(dementia)      DVT prophylaxis:None Code Status: DNR Family Communication: Family Disposition Plan: Residential hospice   Consultants: Palliative care  Procedures: None  Antimicrobials:  Anti-infectives (From admission, onward)   Start     Dose/Rate Route Frequency Ordered Stop   12/02/18 2230  cefTRIAXone (ROCEPHIN) 1 g in sodium chloride 0.9 % 100 mL IVPB  Status:  Discontinued     1 g 200 mL/hr over 30 Minutes Intravenous Every 24 hours 11/25/18 2157 11/27/18 0947   11/27/18 1000  cefTRIAXone (ROCEPHIN) 1 g in sodium chloride 0.9 % 100 mL IVPB  Status:  Discontinued     1 g 200 mL/hr over 30 Minutes Intravenous Every 24 hours 11/27/18 0947 11/29/18 1422      Subjective: Patient seen and examined the bedside this morning.  Hardly responsive.  Was not in any kind of distress and appears comfortable  Objective: Vitals:   11/29/18 0546 11/29/18 1351 11/29/18 2233 11/30/18 0614  BP: (!) 97/46 (!) 89/61 (!) 138/101 (!) 89/64  Pulse: (!) 59 91 92 96  Resp: 17 15 20 16   Temp: 98.4 F (36.9 C) 97.7 F (36.5 C) 99.6 F (37.6 C) (!) 97.2 F (36.2 C)  TempSrc: Oral Oral Oral Oral  SpO2: 99% 99% 98% 99%  Weight:      Height:        Intake/Output Summary (Last 24 hours) at 11/30/2018 1146 Last data filed at 11/30/2018 0622 Gross per 24 hour  Intake -  Output 600 ml  Net -600 ml   Filed Weights   11/26/18 1502  Weight: 57.3  kg    Examination:   General exam:  Ill-looking, frail elderly lady  HEENT:PERRL,Oral mucosa dry, Ear/Nose normal on gross exam Respiratory system: Decreased air entry on the bases Cardiovascular system: S1 & S2 heard, RRR. No JVD, murmurs, rubs, gallops or clicks. Gastrointestinal system: Abdomen is nondistended, soft and nontender. No organomegaly or masses felt. Normal bowel sounds heard. Central nervous system: Not  Alert and oriented. No focal neurological deficits. Extremities: No edema, no clubbing ,no cyanosis, distal peripheral pulses palpable.  Data Reviewed: I have personally reviewed following labs and imaging studies  CBC: Recent Labs  Lab 11/25/18 1539 11/26/18 0511  WBC 8.0 6.4  NEUTROABS 5.5  --   HGB 13.6 12.5  HCT 42.1 39.0  MCV 104.2* 104.8*  PLT 178 101   Basic Metabolic Panel: Recent Labs  Lab 11/25/18 1539 11/26/18 0511 11/27/18 0751  NA 137 138 139  K 4.9 3.8 4.6  CL 98 104 106  CO2 32 30 28  GLUCOSE 80 87 84  BUN 50* 39* 37*  CREATININE 1.55* 1.26* 1.15*  CALCIUM 8.9 8.5* 8.9  MG  --  2.1  --   PHOS  --  3.0  --    GFR: Estimated Creatinine Clearance: 26.4 mL/min (A) (by C-G formula based on SCr of 1.15 mg/dL (H)). Liver Function Tests: Recent Labs  Lab 11/25/18 1539 11/26/18 0511  AST 33 21  ALT 13 12  ALKPHOS 59 53  BILITOT 1.2 0.7  PROT 6.5 5.7*  ALBUMIN 2.7* 2.3*   No results for input(s): LIPASE, AMYLASE in the last 168 hours. No results for input(s): AMMONIA in the last 168 hours. Coagulation Profile: No results for input(s): INR, PROTIME in the last 168 hours. Cardiac Enzymes: No results for input(s): CKTOTAL, CKMB, CKMBINDEX, TROPONINI in the last 168 hours. BNP (last 3 results) No results for input(s): PROBNP in the last 8760 hours. HbA1C: No results for input(s): HGBA1C in the last 72 hours. CBG: No results for input(s): GLUCAP in the last 168 hours. Lipid Profile: No results for input(s): CHOL, HDL, LDLCALC, TRIG, CHOLHDL, LDLDIRECT in the last 72 hours. Thyroid Function Tests: No results for input(s): TSH, T4TOTAL, FREET4, T3FREE, THYROIDAB in the last 72 hours. Anemia Panel: No results for input(s): VITAMINB12, FOLATE, FERRITIN, TIBC, IRON, RETICCTPCT in the last 72 hours. Sepsis Labs: No results for input(s): PROCALCITON, LATICACIDVEN in the last 168 hours.  Recent Results (from the past 240 hour(s))  Urine culture      Status: Abnormal   Collection Time: 11/25/18  5:49 PM  Result Value Ref Range Status   Specimen Description   Final    URINE, RANDOM Performed at [place] [address], [place], [place] [address]    Special Requests   Final    NONE Performed at [facility], [place] [address] [place], [place] [address]    Culture MULTIPLE SPECIES PRESENT, SUGGEST RECOLLECTION (A)  Final   Report Status 11/27/2018 FINAL  Final  MRSA PCR Screening     Status: None   Collection Time: 11/25/18 10:49 PM  Result Value Ref Range Status   MRSA by PCR NEGATIVE NEGATIVE Final    Comment:        The GeneXpert MRSA Assay (FDA approved for NASAL specimens only), is one component of a comprehensive MRSA colonization surveillance program. It is not intended to diagnose MRSA infection nor to guide or monitor treatment for MRSA infections. Performed at [facility], [place]  [address] [place], [place] [address]          Radiology Studies: No results found.      Scheduled Meds: . diphenhydrAMINE  25 mg Intravenous Once  . divalproex  250 mg Oral BID AC  . divalproex  500 mg Oral QHS  . dorzolamide-timolol  1 drop Right Eye BID  . guaiFENesin  600 mg Oral BID  . latanoprost  1 drop Right Eye QHS  . QUEtiapine  25 mg Oral QPM  . QUEtiapine  50 mg Oral QHS   Continuous Infusions: . sodium chloride 75 mL/hr at 11/29/18 1547     LOS: 4 days    Time spent:25 mins. More than 50% of that time was spent in counseling and/or coordination of care.      Shelly Coss, MD [facility] Pager [phone]  If 7PM-7AM, please contact night-coverage [email or web address] Password [facility] 11/30/2018, 11:46 AM

## 2018-11-30 NOTE — Progress Notes (Signed)
CSW received a call from [place] with [facility] and pt will be going on by [facility]AR and RN is looking to the provider for a D/C summary.  CSW will continue to follow for D/C needs.  Alphonse Guild. Estee Yohe, LCSW, LCAS, CSI Transitions of Care Clinical Social Worker Care Coordination Department Ph: [phone]

## 2018-11-30 NOTE — Discharge Summary (Signed)
Physician Discharge Summary  [name]:[id number] DOB: Dec 18, 1924 DOA: 11/25/2018  PCP: Ferd Hibbs, NP  Admit date: 11/25/2018 Discharge date: 11/30/2018  Admitted From: Home Disposition:  Home  Discharge Condition:Critically ill CODE STATUS: Comfort Care Diet recommendation:  Dysphagia 3  Brief/Interim Summary: Patient is a 85 year old female with history of dementia, systolic CHF, atrial fibrillation, dysphagia, GERD, hiatal hernia, hypertension, hypothyroidism, status post pacemaker, seizure disorder, tachy- bradycardia syndrome who was brought here for the evaluation of lethargy, hallucinations, encephalopathy.  There was concern for urinary tract infection.  Started on antibiotics.  Since her mental status and oral intake did not  improved, Palliative  care was consulted.  Started on full comfort care now.  Plan can be  Discharged to residential hospice as soon as bed is available.  Following problems were addressed during her  hospitalization:  Acute metabolic encephalopathy: Suspected to be from urinary tract infection initially.  Her mental status continues to deteriorate.  Hardly responsive this morning.  UTI: Culture sent here showed multiple species. Was On ceftriaxone.  We discontinued antibiotics because the patient is on full comfort care.  Acute kidney injury: Suspected to be dehydrated on presentation.  On gentle IV fluids  Atrial fibrillation/tachybradycardia syndrome: Status post pacemaker.  On Tikosyn, metoprolol, Pradaxa  Dementia: On Seroquel and Namenda.  Dementia is pretty advanced.  Hypertension: Currently normotensive.  Antihypertensives on hold.  Diastolic heart failure: Appears slightly volume overloaded.  Last EF of 55 to 60%.  Moderate malnutrition: Seen by dietitian.  Continues to have poor oral intake.    Discharge Diagnoses:  Active Problems:   Hypertension   GERD (gastroesophageal reflux disease)   Atrial fibrillation (HCC)    Tachycardia-bradycardia (HCC)   Diastolic CHF, chronic (HCC)   Dysphagia   Pacemaker-St.Jude   Dehydration   Acute lower UTI   AKI (acute kidney injury) ([place])   Acute encephalopathy   Venous stasis ulcer ([place])   Dementia with behavioral disturbance (HCC)   UTI (urinary tract infection)   Malnutrition of moderate degree    Discharge Instructions  Discharge Instructions    Diet - low sodium heart healthy   Complete by:  As directed    Increase activity slowly   Complete by:  As directed      Allergies as of 11/30/2018      Reactions   Amiodarone    Possibly caused pneumonitis      Medication List    STOP taking these medications   Benicar 40 MG tablet Generic drug:  olmesartan   calcium carbonate 750 MG chewable tablet Commonly known as:  TUMS EX   cholecalciferol 1000 units tablet Commonly known as:  VITAMIN D   divalproex 125 MG capsule Commonly known as:  DEPAKOTE SPRINKLE   divalproex 500 MG DR tablet Commonly known as:  DEPAKOTE   dofetilide 250 MCG capsule Commonly known as:  TIKOSYN   dorzolamide-timolol 22.3-6.8 MG/ML ophthalmic solution Commonly known as:  COSOPT   furosemide 40 MG tablet Commonly known as:  LASIX   Klor-Con M10 10 MEQ tablet Generic drug:  potassium chloride   LATANOPROST OP   LORazepam 0.5 MG tablet Commonly known as:  ATIVAN   memantine 10 MG tablet Commonly known as:  NAMENDA   metoprolol tartrate 25 MG tablet Commonly known as:  LOPRESSOR   multivitamins ther. w/minerals Tabs tablet   Myrbetriq 50 MG Tb24 tablet Generic drug:  mirabegron ER   pantoprazole 40 MG tablet Commonly known as:  PROTONIX  Pradaxa 75 MG Caps capsule Generic drug:  dabigatran   QUEtiapine 25 MG tablet Commonly known as:  SEROQUEL   QUEtiapine 50 MG tablet Commonly known as:  SEROQUEL   Spiriva Respimat 1.25 MCG/ACT Aers Generic drug:  Tiotropium Bromide Monohydrate   spironolactone 12.5 mg Tabs tablet Commonly known as:   ALDACTONE   Toviaz 4 MG Tb24 tablet Generic drug:  fesoterodine       Allergies  Allergen Reactions  . Amiodarone     Possibly caused pneumonitis    Consultations:  Palliative care   Procedures/Studies: Vas [place] Lower Extremity Venous (dvt)  Result Date: 11/27/2018  Lower Venous Study Indications: Pain.  Performing Technologist: Oliver Hum RVT  Examination Guidelines: A complete evaluation includes B-mode imaging, spectral Doppler, color Doppler, and power Doppler as needed of all accessible portions of each vessel. Bilateral testing is considered an integral part of a complete examination. Limited examinations for reoccurring indications may be performed as noted.  Right Venous Findings: +---+---------------+---------+-----------+----------+-------+    CompressibilityPhasicitySpontaneityPropertiesSummary +---+---------------+---------+-----------+----------+-------+ CFVFull           Yes      Yes                          +---+---------------+---------+-----------+----------+-------+  Left Venous Findings: +---------+---------------+---------+-----------+----------+-------+          CompressibilityPhasicitySpontaneityPropertiesSummary +---------+---------------+---------+-----------+----------+-------+ CFV      Full           Yes      Yes                          +---------+---------------+---------+-----------+----------+-------+ SFJ      Full                                                 +---------+---------------+---------+-----------+----------+-------+ FV Prox  Full                                                 +---------+---------------+---------+-----------+----------+-------+ FV Mid   Full                                                 +---------+---------------+---------+-----------+----------+-------+ FV DistalFull                                                  +---------+---------------+---------+-----------+----------+-------+ PFV      Full                                                 +---------+---------------+---------+-----------+----------+-------+ POP      Full           Yes      Yes                          +---------+---------------+---------+-----------+----------+-------+  PTV      Full                                                 +---------+---------------+---------+-----------+----------+-------+ PERO     Full                                                 +---------+---------------+---------+-----------+----------+-------+    Summary: Right: No evidence of common femoral vein obstruction. Left: There is no evidence of deep vein thrombosis in the lower extremity. No cystic structure found in the popliteal fossa.  *See table(s) above for measurements and observations. Electronically signed by Harold Barban MD on 11/27/2018 at 6:49:26 PM.    Final        Subjective: Patient seen and examined the bedside this morning.  Remains unresponsive.  Doesnot have any oral intake.  Discharge Exam: Vitals:   11/29/18 2233 11/30/18 0614  BP: (!) 138/101 (!) 89/64  Pulse: 92 96  Resp: 20 16  Temp: 99.6 F (37.6 C) (!) 97.2 F (36.2 C)  SpO2: 98% 99%   Vitals:   11/29/18 0546 11/29/18 1351 11/29/18 2233 11/30/18 0614  BP: (!) 97/46 (!) 89/61 (!) 138/101 (!) 89/64  Pulse: (!) 59 91 92 96  Resp: 17 15 20 16   Temp: 98.4 F (36.9 C) 97.7 F (36.5 C) 99.6 F (37.6 C) (!) 97.2 F (36.2 C)  TempSrc: Oral Oral Oral Oral  SpO2: 99% 99% 98% 99%  Weight:      Height:        General: Pt is not alert, awake Cardiovascular: RRR, S1/S2 +, no rubs, no gallops Respiratory: CTA bilaterally, no wheezing, no rhonchi Abdominal: Soft, NT, ND, bowel sounds + Extremities: no edema, no cyanosis    The results of significant diagnostics from this hospitalization (including imaging, microbiology, ancillary and laboratory) are  listed below for reference.     Microbiology: Recent Results (from the past 240 hour(s))  Urine culture     Status: Abnormal   Collection Time: 11/25/18  5:49 PM  Result Value Ref Range Status   Specimen Description   Final    URINE, RANDOM Performed at [place] [address], [place], [place] [address]    Special Requests   Final    NONE Performed at [facility], [place] [address], [place], [place] [address]    Culture MULTIPLE SPECIES PRESENT, SUGGEST RECOLLECTION (A)  Final   Report Status 11/27/2018 FINAL  Final  MRSA PCR Screening     Status: None   Collection Time: 11/25/18 10:49 PM  Result Value Ref Range Status   MRSA by PCR NEGATIVE NEGATIVE Final    Comment:        The GeneXpert MRSA Assay (FDA approved for NASAL specimens only), is one component of a comprehensive MRSA colonization surveillance program. It is not intended to diagnose MRSA infection nor to guide or monitor treatment for MRSA infections. Performed at [facility], [place] [address], [place], [place] [address]      Labs: BNP (last 3 results) No results for input(s): BNP in the last 8760 hours. Basic Metabolic Panel: Recent Labs  Lab 11/25/18 1539 11/26/18 0511 11/27/18 0751  NA 137 138 139  K 4.9 3.8 4.6  CL 98 104 106  CO2 32 30 28  GLUCOSE 80 87 84  BUN 50* 39* 37*  CREATININE 1.55* 1.26* 1.15*  CALCIUM 8.9 8.5* 8.9  MG  --  2.1  --   PHOS  --  3.0  --    Liver Function Tests: Recent Labs  Lab 11/25/18 1539 11/26/18 0511  AST 33 21  ALT 13 12  ALKPHOS 59 53  BILITOT 1.2 0.7  PROT 6.5 5.7*  ALBUMIN 2.7* 2.3*   No results for input(s): LIPASE, AMYLASE in the last 168 hours. No results for input(s): AMMONIA in the last 168 hours. CBC: Recent Labs  Lab 11/25/18 1539 11/26/18 0511  WBC 8.0 6.4  NEUTROABS 5.5  --   HGB 13.6 12.5  HCT 42.1 39.0  MCV 104.2* 104.8*  PLT 178 156   Cardiac Enzymes: No results  for input(s): CKTOTAL, CKMB, CKMBINDEX, TROPONINI in the last 168 hours. BNP: Invalid input(s): POCBNP CBG: No results for input(s): GLUCAP in the last 168 hours. D-Dimer No results for input(s): DDIMER in the last 72 hours. Hgb A1c No results for input(s): HGBA1C in the last 72 hours. Lipid Profile No results for input(s): CHOL, HDL, LDLCALC, TRIG, CHOLHDL, LDLDIRECT in the last 72 hours. Thyroid function studies No results for input(s): TSH, T4TOTAL, T3FREE, THYROIDAB in the last 72 hours.  Invalid input(s): FREET3 Anemia work up No results for input(s): VITAMINB12, FOLATE, FERRITIN, TIBC, IRON, RETICCTPCT in the last 72 hours. Urinalysis    Component Value Date/Time   COLORURINE YELLOW 11/25/2018 1749   APPEARANCEUR HAZY (A) 11/25/2018 1749   LABSPEC 1.010 11/25/2018 1749   PHURINE 7.5 11/25/2018 1749   GLUCOSEU NEGATIVE 11/25/2018 1749   HGBUR TRACE (A) 11/25/2018 1749   BILIRUBINUR NEGATIVE 11/25/2018 1749   KETONESUR NEGATIVE 11/25/2018 1749   PROTEINUR NEGATIVE 11/25/2018 1749   UROBILINOGEN 0.2 07/14/2011 1408   NITRITE POSITIVE (A) 11/25/2018 1749   LEUKOCYTESUR LARGE (A) 11/25/2018 1749   Sepsis Labs Invalid input(s): PROCALCITONIN,  WBC,  LACTICIDVEN Microbiology Recent Results (from the past 240 hour(s))  Urine culture     Status: Abnormal   Collection Time: 11/25/18  5:49 PM  Result Value Ref Range Status   Specimen Description   Final    URINE, RANDOM Performed at [facility], [place] [address] [place], [place] [address]    Special Requests   Final    NONE Performed at [facility], [place] [address], [place], [place] [address]    Culture MULTIPLE SPECIES PRESENT, SUGGEST RECOLLECTION (A)  Final   Report Status 11/27/2018 FINAL  Final  MRSA PCR Screening     Status: None   Collection Time: 11/25/18 10:49 PM  Result Value Ref Range Status   MRSA by PCR NEGATIVE NEGATIVE Final    Comment:        The GeneXpert MRSA  Assay (FDA approved for NASAL specimens only), is one component of a comprehensive MRSA colonization surveillance program. It is not intended to diagnose MRSA infection nor to guide or monitor treatment for MRSA infections. Performed at [facility], [place] [address] [place], [place] [address]     Please note: You were cared for by a hospitalist during your hospital stay. Once you are discharged, your primary care physician will handle any further medical issues. Please note that NO REFILLS for any discharge medications will be authorized once you are discharged, as it  is imperative that you return to your primary care physician (or establish a relationship with a primary care physician if you do not have one) for your post hospital discharge needs so that they can reassess your need for medications and monitor your lab values.    Time coordinating discharge: 40 minutes  SIGNED:   Shelly Coss, MD  [facility] 11/30/2018, 2:10 PM Pager [phone]  If 7PM-7AM, please contact night-coverage [email or web address] Password [email or web address]

## 2018-11-30 NOTE — Progress Notes (Signed)
Daily Progress Note   Patient Name: [name]       Date: 11/30/2018 DOB: [birth year]  Age: 85 y.o. MRN#: [id number] Attending Physician: Shelly Coss, MD Primary Care Physician: Ferd Hibbs, NP Admit Date: 11/25/2018  Reason for Consultation/Follow-up: Establishing goals of care  Subjective: Husband at bedside.    Patient is able to open eyes today.  Denies pain when asked but otherwise nonconversant.   Length of Stay: 4  Current Medications: Scheduled Meds:  . diphenhydrAMINE  25 mg Intravenous Once  . divalproex  250 mg Oral BID AC  . divalproex  500 mg Oral QHS  . dorzolamide-timolol  1 drop Right Eye BID  . guaiFENesin  600 mg Oral BID  . latanoprost  1 drop Right Eye QHS  . QUEtiapine  25 mg Oral QPM  . QUEtiapine  50 mg Oral QHS    Continuous Infusions: . sodium chloride 75 mL/hr at 11/29/18 1547    PRN Meds: acetaminophen **OR** acetaminophen, albuterol, antiseptic oral rinse, glycopyrrolate **OR** glycopyrrolate **OR** glycopyrrolate, haloperidol **OR** haloperidol **OR** haloperidol lactate, LORazepam **OR** LORazepam **OR** LORazepam, morphine injection, ondansetron **OR** ondansetron (ZOFRAN) IV, polyvinyl alcohol  Physical Exam         General: Sleeping.Wakes briefly, but then immediately back to sleep HEENT: No bruits, no goiter, no JVD Heart: Regular rate and rhythm. No murmur appreciated. Lungs: Good air movement, clear Abdomen: Soft, nontender, nondistended, positive bowel sounds.  Ext: No significant edema  Vital Signs: BP (!) 89/64 (BP Location: Left Arm)   Pulse 96   Temp (!) 97.2 F (36.2 C) (Oral)   Resp 16   Ht 5\' 3"  (1.6 m)   Wt 57.3 kg   SpO2 99%   BMI 22.38 kg/m  SpO2: SpO2: 99 % O2 Device: O2 Device: Nasal Cannula O2 Flow  Rate: O2 Flow Rate (L/min): 1 L/min  Intake/output summary:   Intake/Output Summary (Last 24 hours) at 11/30/2018 1003 Last data filed at 11/30/2018 4098 Gross per 24 hour  Intake -  Output 600 ml  Net -600 ml   LBM: Last BM Date: 11/28/18 Baseline Weight: Weight: 57.3 kg Most recent weight: Weight: 57.3 kg       Palliative Assessment/Data:    Flowsheet Rows     Most Recent Value  Intake Tab  Referral Department  Hospitalist  Unit at Time of Referral  ER  Palliative Care Primary Diagnosis  Neurology  Date Notified  11/26/18  Palliative Care Type  New Palliative care  Reason for referral  Clarify Goals of Care  Date of Admission  11/25/18  Date first seen by Palliative Care  11/26/18  # of days Palliative referral response time  0 Day(s)  # of days IP prior to Palliative referral  1  Clinical Assessment  Psychosocial & Spiritual Assessment  Palliative Care Outcomes  Patient/Family meeting held?  Yes  Who was at the meeting?  Husband      Patient Active Problem List   Diagnosis Date Noted  . Malnutrition of moderate degree 11/28/2018  . UTI (urinary tract infection) 11/26/2018  . Dehydration 11/25/2018  . Acute lower UTI 11/25/2018  . AKI (acute kidney injury) ([place]) 11/25/2018  . Acute encephalopathy 11/25/2018  . Venous stasis ulcer ([place]) 11/25/2018  . Dementia with behavioral disturbance ([place]) 11/25/2018  . HTN (hypertension) 05/05/2015  . Fatigue 11/02/2014  . Acute on chronic diastolic heart failure ([place]) 05/26/2013  . Pacemaker-St.Jude 07/03/2012  . Hyponatremia 09/18/2011  . Dysphagia 07/23/2011  . Anticoagulant long-term use 06/28/2011  . Tachycardia-bradycardia ([place]) 02/06/2011  . Diastolic CHF, chronic ([place]) 02/06/2011  . Atrial fibrillation ([place]) 02/01/2011  . Hypertension   . Renal artery stenosis ([place])   . GERD (gastroesophageal reflux disease)     Palliative Care Assessment & Plan   Patient Profile:  85 y.o. female  with past medical  history of CHF, a fib, dysphagia, GERD, hiatal hernia, HTN, hypothyroid, pacemaker, seizure d/o, SIADH, tachybrady, dementia with worsened behaviors recently moved from ALF to SNF care at [facility] admitted on 11/25/2018 with decreased mental status.     Recommendations/Plan:  Clinically, Ms. Nifong continues to decline.  She did briefly wake for me today, but still with no documented intake in chart.  Family reports that her desire would be to focus only on comfort and dignity moving forward.  They are hopeful for placement at [facility] for end of life care.    Code Status:    Code Status Orders  (From admission, onward)         Start     Ordered   11/25/18 2157  Do not attempt resuscitation (DNR)  Continuous    Question Answer Comment  In the event of cardiac or respiratory ARREST Do not call a "code blue"   In the event of cardiac or respiratory ARREST Do not perform Intubation, CPR, defibrillation or ACLS   In the event of cardiac or respiratory ARREST Use medication by any route, position, wound care, and other measures to relive pain and suffering. May use oxygen, suction and manual treatment of airway obstruction as needed for comfort.      11/25/18 2157        Code Status History    Date Active Date Inactive Code Status Order ID Comments User Context   05/28/2013 1928 05/31/2013 1529 Full Code [id number]  Andrez Grime, PA-C Inpatient    Advance Directive Documentation     Most Recent Value  Type of Advance Directive  Healthcare Power of [place], Living will  Pre-existing out of facility DNR order (yellow form or pink MOST form)  -  "MOST" Form in Place?  -       Prognosis:   < 2 weeks- She continues to decline with increasing somnolence and no real PO intake.  I believe her prognosis at this  point is < 2 weeks.  Discharge Planning:  Hospice facility  Care plan was discussed with husband and daugther  Thank you for allowing the Palliative Medicine Team to  assist in the care of this patient.   Total Time 20 Prolonged Time Billed No      Greater than 50%  of this time was spent counseling and coordinating care related to the above assessment and plan.  Micheline Rough, MD  Please contact Palliative Medicine Team phone at [phone] for questions and concerns.

## 2018-12-09 ENCOUNTER — Telehealth: Payer: Self-pay | Admitting: Cardiology

## 2018-12-09 NOTE — Telephone Encounter (Signed)
Patient husband called and stated that patient away. He wanted to know what to do w/ pt home monitor. I informed pt that husband that he can discard the patient merlin monitor.

## 2018-12-18 DEATH — deceased
